# Patient Record
Sex: Male | Born: 1935 | Race: White | Hispanic: No | Smoking: Former smoker
Health system: Southern US, Community
[De-identification: ages and names within clinical notes are randomized; demographics above are authoritative.]

## PROBLEM LIST (undated history)

## (undated) DIAGNOSIS — J449 Chronic obstructive pulmonary disease, unspecified: Secondary | ICD-10-CM

## (undated) DIAGNOSIS — I4891 Unspecified atrial fibrillation: Secondary | ICD-10-CM

## (undated) DIAGNOSIS — E119 Type 2 diabetes mellitus without complications: Secondary | ICD-10-CM

## (undated) DIAGNOSIS — F0390 Unspecified dementia without behavioral disturbance: Secondary | ICD-10-CM

## (undated) DIAGNOSIS — I5042 Chronic combined systolic (congestive) and diastolic (congestive) heart failure: Secondary | ICD-10-CM

## (undated) DIAGNOSIS — I10 Essential (primary) hypertension: Secondary | ICD-10-CM

## (undated) DIAGNOSIS — E7800 Pure hypercholesterolemia, unspecified: Secondary | ICD-10-CM

## (undated) HISTORY — DX: Unspecified dementia, unspecified severity, without behavioral disturbance, psychotic disturbance, mood disturbance, and anxiety: F03.90

## (undated) HISTORY — PX: CHOLECYSTECTOMY: SHX55

## (undated) HISTORY — PX: CARPAL TUNNEL RELEASE: SHX101

## (undated) HISTORY — PX: BACK SURGERY: SHX140

---

## 1999-08-04 ENCOUNTER — Encounter: Payer: Self-pay | Admitting: Emergency Medicine

## 1999-08-04 ENCOUNTER — Emergency Department (HOSPITAL_COMMUNITY): Admission: EM | Admit: 1999-08-04 | Discharge: 1999-08-04 | Payer: Self-pay | Admitting: Emergency Medicine

## 1999-10-22 ENCOUNTER — Emergency Department (HOSPITAL_COMMUNITY): Admission: EM | Admit: 1999-10-22 | Discharge: 1999-10-22 | Payer: Self-pay | Admitting: Emergency Medicine

## 2000-05-01 ENCOUNTER — Inpatient Hospital Stay (HOSPITAL_COMMUNITY): Admission: EM | Admit: 2000-05-01 | Discharge: 2000-05-04 | Payer: Self-pay | Admitting: *Deleted

## 2000-06-24 ENCOUNTER — Emergency Department (HOSPITAL_COMMUNITY): Admission: EM | Admit: 2000-06-24 | Discharge: 2000-06-24 | Payer: Self-pay | Admitting: Emergency Medicine

## 2000-11-17 ENCOUNTER — Encounter: Payer: Self-pay | Admitting: Emergency Medicine

## 2000-11-17 ENCOUNTER — Inpatient Hospital Stay (HOSPITAL_COMMUNITY): Admission: EM | Admit: 2000-11-17 | Discharge: 2000-11-19 | Payer: Self-pay | Admitting: Emergency Medicine

## 2000-11-17 ENCOUNTER — Encounter: Payer: Self-pay | Admitting: Cardiology

## 2000-12-17 ENCOUNTER — Encounter: Payer: Self-pay | Admitting: Emergency Medicine

## 2000-12-17 ENCOUNTER — Emergency Department (HOSPITAL_COMMUNITY): Admission: EM | Admit: 2000-12-17 | Discharge: 2000-12-18 | Payer: Self-pay | Admitting: Emergency Medicine

## 2000-12-18 ENCOUNTER — Encounter: Payer: Self-pay | Admitting: *Deleted

## 2001-05-17 ENCOUNTER — Other Ambulatory Visit: Admission: RE | Admit: 2001-05-17 | Discharge: 2001-05-17 | Payer: Self-pay | Admitting: Gastroenterology

## 2001-05-24 ENCOUNTER — Encounter: Payer: Self-pay | Admitting: Gastroenterology

## 2001-05-24 ENCOUNTER — Ambulatory Visit (HOSPITAL_COMMUNITY): Admission: RE | Admit: 2001-05-24 | Discharge: 2001-05-24 | Payer: Self-pay | Admitting: Gastroenterology

## 2001-12-14 ENCOUNTER — Ambulatory Visit (HOSPITAL_COMMUNITY): Admission: RE | Admit: 2001-12-14 | Discharge: 2001-12-14 | Payer: Self-pay | Admitting: Orthopaedic Surgery

## 2002-02-02 ENCOUNTER — Inpatient Hospital Stay (HOSPITAL_COMMUNITY): Admission: RE | Admit: 2002-02-02 | Discharge: 2002-02-03 | Payer: Self-pay | Admitting: Orthopaedic Surgery

## 2002-02-09 ENCOUNTER — Encounter: Payer: Self-pay | Admitting: Cardiology

## 2002-02-09 ENCOUNTER — Emergency Department (HOSPITAL_COMMUNITY): Admission: EM | Admit: 2002-02-09 | Discharge: 2002-02-09 | Payer: Self-pay | Admitting: *Deleted

## 2002-02-09 ENCOUNTER — Encounter: Payer: Self-pay | Admitting: *Deleted

## 2003-06-10 ENCOUNTER — Inpatient Hospital Stay (HOSPITAL_COMMUNITY): Admission: RE | Admit: 2003-06-10 | Discharge: 2003-06-14 | Payer: Self-pay | Admitting: Orthopaedic Surgery

## 2004-08-08 ENCOUNTER — Emergency Department: Payer: Self-pay | Admitting: Unknown Physician Specialty

## 2004-09-28 ENCOUNTER — Inpatient Hospital Stay (HOSPITAL_COMMUNITY): Admission: EM | Admit: 2004-09-28 | Discharge: 2004-09-30 | Payer: Self-pay | Admitting: Emergency Medicine

## 2005-06-18 ENCOUNTER — Ambulatory Visit: Payer: Self-pay

## 2005-08-06 ENCOUNTER — Ambulatory Visit: Payer: Self-pay | Admitting: Internal Medicine

## 2005-08-19 ENCOUNTER — Ambulatory Visit: Payer: Self-pay | Admitting: Internal Medicine

## 2005-09-16 ENCOUNTER — Ambulatory Visit: Payer: Self-pay | Admitting: Internal Medicine

## 2005-11-10 ENCOUNTER — Ambulatory Visit: Payer: Self-pay | Admitting: Internal Medicine

## 2005-11-16 ENCOUNTER — Ambulatory Visit: Payer: Self-pay | Admitting: Internal Medicine

## 2005-12-28 ENCOUNTER — Ambulatory Visit: Payer: Self-pay | Admitting: Internal Medicine

## 2006-01-31 ENCOUNTER — Ambulatory Visit: Payer: Self-pay | Admitting: Internal Medicine

## 2006-02-16 ENCOUNTER — Ambulatory Visit: Payer: Self-pay | Admitting: Internal Medicine

## 2006-05-09 ENCOUNTER — Ambulatory Visit: Payer: Self-pay | Admitting: Internal Medicine

## 2006-05-19 ENCOUNTER — Ambulatory Visit: Payer: Self-pay | Admitting: Internal Medicine

## 2006-06-07 ENCOUNTER — Ambulatory Visit: Payer: Self-pay | Admitting: Gastroenterology

## 2006-07-13 ENCOUNTER — Ambulatory Visit: Payer: Self-pay | Admitting: Internal Medicine

## 2006-07-15 ENCOUNTER — Ambulatory Visit: Payer: Self-pay | Admitting: Oncology

## 2006-07-20 ENCOUNTER — Ambulatory Visit: Payer: Self-pay | Admitting: Internal Medicine

## 2006-08-17 ENCOUNTER — Other Ambulatory Visit: Payer: Self-pay

## 2006-08-17 ENCOUNTER — Ambulatory Visit: Payer: Self-pay | Admitting: General Surgery

## 2006-08-18 ENCOUNTER — Ambulatory Visit: Payer: Self-pay | Admitting: General Surgery

## 2006-09-06 ENCOUNTER — Ambulatory Visit: Payer: Self-pay | Admitting: Internal Medicine

## 2006-09-17 ENCOUNTER — Ambulatory Visit: Payer: Self-pay | Admitting: Internal Medicine

## 2006-10-06 ENCOUNTER — Ambulatory Visit: Payer: Self-pay | Admitting: Internal Medicine

## 2006-11-15 ENCOUNTER — Other Ambulatory Visit: Payer: Self-pay

## 2006-11-16 ENCOUNTER — Inpatient Hospital Stay: Payer: Self-pay | Admitting: Internal Medicine

## 2007-01-10 ENCOUNTER — Ambulatory Visit: Payer: Self-pay | Admitting: Cardiology

## 2007-01-13 ENCOUNTER — Ambulatory Visit: Payer: Self-pay | Admitting: Gastroenterology

## 2007-01-17 ENCOUNTER — Ambulatory Visit: Payer: Self-pay | Admitting: Internal Medicine

## 2007-01-23 ENCOUNTER — Ambulatory Visit: Payer: Self-pay | Admitting: Internal Medicine

## 2007-02-17 ENCOUNTER — Ambulatory Visit: Payer: Self-pay | Admitting: Internal Medicine

## 2007-03-20 ENCOUNTER — Ambulatory Visit: Payer: Self-pay | Admitting: Internal Medicine

## 2007-05-02 ENCOUNTER — Ambulatory Visit: Payer: Self-pay | Admitting: Family

## 2007-05-20 ENCOUNTER — Ambulatory Visit: Payer: Self-pay | Admitting: Internal Medicine

## 2007-06-12 ENCOUNTER — Ambulatory Visit: Payer: Self-pay | Admitting: Internal Medicine

## 2007-06-19 ENCOUNTER — Ambulatory Visit: Payer: Self-pay | Admitting: Internal Medicine

## 2007-06-27 ENCOUNTER — Ambulatory Visit: Payer: Self-pay

## 2007-08-04 ENCOUNTER — Ambulatory Visit: Payer: Self-pay

## 2007-08-14 ENCOUNTER — Ambulatory Visit: Payer: Self-pay | Admitting: Family

## 2007-08-20 ENCOUNTER — Ambulatory Visit: Payer: Self-pay | Admitting: Internal Medicine

## 2007-09-11 ENCOUNTER — Ambulatory Visit: Payer: Self-pay | Admitting: Internal Medicine

## 2007-09-17 ENCOUNTER — Ambulatory Visit: Payer: Self-pay | Admitting: Internal Medicine

## 2007-10-18 ENCOUNTER — Ambulatory Visit: Payer: Self-pay | Admitting: Internal Medicine

## 2007-10-31 ENCOUNTER — Ambulatory Visit: Payer: Self-pay | Admitting: Family

## 2008-02-14 ENCOUNTER — Ambulatory Visit: Payer: Self-pay | Admitting: Family

## 2008-03-06 ENCOUNTER — Ambulatory Visit: Payer: Self-pay | Admitting: Orthopedic Surgery

## 2008-04-16 ENCOUNTER — Ambulatory Visit: Payer: Self-pay | Admitting: Internal Medicine

## 2008-04-18 ENCOUNTER — Ambulatory Visit: Payer: Self-pay | Admitting: Internal Medicine

## 2008-05-19 ENCOUNTER — Ambulatory Visit: Payer: Self-pay | Admitting: Internal Medicine

## 2008-09-16 ENCOUNTER — Ambulatory Visit: Payer: Self-pay | Admitting: Internal Medicine

## 2008-10-16 ENCOUNTER — Ambulatory Visit: Payer: Self-pay | Admitting: Internal Medicine

## 2008-10-17 ENCOUNTER — Ambulatory Visit: Payer: Self-pay | Admitting: Internal Medicine

## 2009-01-16 ENCOUNTER — Observation Stay: Payer: Self-pay | Admitting: Internal Medicine

## 2009-04-18 ENCOUNTER — Ambulatory Visit: Payer: Self-pay | Admitting: Internal Medicine

## 2009-05-06 ENCOUNTER — Ambulatory Visit: Payer: Self-pay | Admitting: Internal Medicine

## 2009-05-19 ENCOUNTER — Ambulatory Visit: Payer: Self-pay | Admitting: Internal Medicine

## 2009-07-25 ENCOUNTER — Ambulatory Visit: Payer: Self-pay

## 2010-07-14 ENCOUNTER — Inpatient Hospital Stay: Payer: Self-pay | Admitting: Internal Medicine

## 2010-10-08 ENCOUNTER — Emergency Department: Payer: Self-pay | Admitting: Emergency Medicine

## 2011-06-15 ENCOUNTER — Ambulatory Visit: Payer: Self-pay | Admitting: Specialist

## 2013-06-05 ENCOUNTER — Observation Stay: Payer: Self-pay | Admitting: Internal Medicine

## 2013-06-05 LAB — CBC
HCT: 41 % (ref 40.0–52.0)
HGB: 14.3 g/dL (ref 13.0–18.0)
MCH: 29.9 pg (ref 26.0–34.0)
MCHC: 34.9 g/dL (ref 32.0–36.0)
MCV: 86 fL (ref 80–100)
Platelet: 131 10*3/uL — ABNORMAL LOW (ref 150–440)
RBC: 4.78 10*6/uL (ref 4.40–5.90)
RDW: 12.7 % (ref 11.5–14.5)
WBC: 9.4 10*3/uL (ref 3.8–10.6)

## 2013-06-05 LAB — URINALYSIS, COMPLETE
Bacteria: NONE SEEN
Bilirubin,UR: NEGATIVE
Blood: NEGATIVE
Glucose,UR: 50 mg/dL (ref 0–75)
Ketone: NEGATIVE
Leukocyte Esterase: NEGATIVE
Nitrite: NEGATIVE
Ph: 7 (ref 4.5–8.0)
Protein: NEGATIVE
RBC,UR: <1 /HPF (ref 0–5)
Specific Gravity: 1.012 (ref 1.003–1.030)
Squamous Epithelial: NONE SEEN
WBC UR: <1 /HPF (ref 0–5)

## 2013-06-05 LAB — COMPREHENSIVE METABOLIC PANEL
Albumin: 3.5 g/dL (ref 3.4–5.0)
Alkaline Phosphatase: 118 U/L (ref 50–136)
Anion Gap: 8 (ref 7–16)
BUN: 9 mg/dL (ref 7–18)
Bilirubin,Total: 0.6 mg/dL (ref 0.2–1.0)
Calcium, Total: 8.8 mg/dL (ref 8.5–10.1)
Chloride: 106 mmol/L (ref 98–107)
Co2: 27 mmol/L (ref 21–32)
Creatinine: 1.06 mg/dL (ref 0.60–1.30)
EGFR (African American): >60
EGFR (Non-African Amer.): >60
Glucose: 109 mg/dL — ABNORMAL HIGH (ref 65–99)
Osmolality: 281 (ref 275–301)
Potassium: 3.5 mmol/L (ref 3.5–5.1)
SGOT(AST): 18 U/L (ref 15–37)
SGPT (ALT): 22 U/L (ref 12–78)
Sodium: 141 mmol/L (ref 136–145)
Total Protein: 7.2 g/dL (ref 6.4–8.2)

## 2013-06-05 LAB — TROPONIN I: Troponin-I: 0.02 ng/mL

## 2013-06-05 LAB — CK TOTAL AND CKMB (NOT AT ARMC)
CK, Total: 76 U/L (ref 35–232)
CK-MB: 0.9 ng/mL (ref 0.5–3.6)

## 2013-06-05 LAB — RAPID INFLUENZA A&B ANTIGENS

## 2013-06-06 LAB — CBC WITH DIFFERENTIAL/PLATELET
Basophil #: 0 10*3/uL (ref 0.0–0.1)
Basophil %: 0.5 %
Eosinophil #: 0.1 10*3/uL (ref 0.0–0.7)
Eosinophil %: 0.6 %
HCT: 37.4 % — ABNORMAL LOW (ref 40.0–52.0)
HGB: 12.9 g/dL — ABNORMAL LOW (ref 13.0–18.0)
Lymphocyte #: 1.6 10*3/uL (ref 1.0–3.6)
Lymphocyte %: 19.5 %
MCH: 30 pg (ref 26.0–34.0)
MCHC: 34.6 g/dL (ref 32.0–36.0)
MCV: 87 fL (ref 80–100)
Monocyte #: 0.8 x10 3/mm (ref 0.2–1.0)
Monocyte %: 10.1 %
Neutrophil #: 5.7 10*3/uL (ref 1.4–6.5)
Neutrophil %: 69.3 %
Platelet: 129 10*3/uL — ABNORMAL LOW (ref 150–440)
RBC: 4.32 10*6/uL — ABNORMAL LOW (ref 4.40–5.90)
RDW: 12.7 % (ref 11.5–14.5)
WBC: 8.3 10*3/uL (ref 3.8–10.6)

## 2013-06-06 LAB — BASIC METABOLIC PANEL
Anion Gap: 5 — ABNORMAL LOW (ref 7–16)
BUN: 11 mg/dL (ref 7–18)
Calcium, Total: 8.5 mg/dL (ref 8.5–10.1)
Chloride: 107 mmol/L (ref 98–107)
Co2: 28 mmol/L (ref 21–32)
Creatinine: 1.11 mg/dL (ref 0.60–1.30)
EGFR (African American): >60
EGFR (Non-African Amer.): >60
Glucose: 110 mg/dL — ABNORMAL HIGH (ref 65–99)
Osmolality: 279 (ref 275–301)
Potassium: 3.5 mmol/L (ref 3.5–5.1)
Sodium: 140 mmol/L (ref 136–145)

## 2013-06-06 LAB — LIPID PANEL
Cholesterol: 89 mg/dL (ref 0–200)
HDL Cholesterol: 37 mg/dL — ABNORMAL LOW (ref 40–60)
Ldl Cholesterol, Calc: 33 mg/dL (ref 0–100)
Triglycerides: 95 mg/dL (ref 0–200)
VLDL Cholesterol, Calc: 19 mg/dL (ref 5–40)

## 2013-06-06 LAB — TROPONIN I
Troponin-I: 0.03 ng/mL
Troponin-I: 0.03 ng/mL

## 2013-06-07 LAB — CREATININE, SERUM
Creatinine: 1.06 mg/dL (ref 0.60–1.30)
EGFR (African American): >60
EGFR (Non-African Amer.): >60

## 2013-06-07 LAB — CLOSTRIDIUM DIFFICILE(ARMC)

## 2013-06-07 LAB — HEMOGLOBIN: HGB: 12.5 g/dL — ABNORMAL LOW (ref 13.0–18.0)

## 2013-06-10 LAB — STOOL CULTURE

## 2013-06-10 LAB — CULTURE, BLOOD (SINGLE)

## 2013-08-20 ENCOUNTER — Ambulatory Visit: Payer: Self-pay | Admitting: Pain Medicine

## 2013-08-20 LAB — SEDIMENTATION RATE: Erythrocyte Sed Rate: 23 mm/hr — ABNORMAL HIGH (ref 0–20)

## 2013-09-07 ENCOUNTER — Ambulatory Visit: Payer: Self-pay | Admitting: Pain Medicine

## 2014-11-08 NOTE — Discharge Summary (Signed)
PATIENT NAME:  Bobby Krueger, LIPSKY MR#:  [id number] DATE OF BIRTH:  Jan 21, 1927  DATE OF ADMISSION:  06/05/2013 DATE OF DISCHARGE:  06/07/2013  ADMITTING DIAGNOSIS: Abdominal pain of unclear etiology.   DISCHARGE DIAGNOSES:  1.  Generalized weakness, likely multifactorial.  2.  Congestive heart failure, acute on chronic, combined systolic, diastolic.  3.  Lower extremity swelling with no deep vein thrombosis.  4.  Cardiomyopathy, ejection fraction of 40% to 45%.  5.  Acute bronchitis.  6.  Chronic obstructive pulmonary disease exacerbation due to bronchitis.  7.  Right lower quadrant abdominal pain.  8.  Diarrhea, chronic, according to history.  9.  Questionable exacerbation of abdominal pain, etiology unclear at this point. Clostridium difficile negative.  10.  Glaucoma.  11.  Chronic obstructive pulmonary disease.  12.  Obstructive sleep apnea.  13.  Coronary artery disease.  14.  Hypertension.  15.  Gastroesophageal reflux disease.  16.  Hyperlipidemia.  17.  Diabetes mellitus.  18.  Left bundle branch block.   DISCHARGE CONDITION: Stable.   DISCHARGE MEDICATIONS:  1.  Zolpidem 10 mg p.o. at bedtime as needed.  2.  Omega-3 polyunsaturated fatty acids 1 g once daily.  3.  Omeprazole 10 mg p.o. daily.  4.  Promethazine 25 mg every 6 hours as needed.  5.  Simvastatin 40 mg p.o. daily.  6.  Terazosin 5 mg p.o. daily.  7.  Spiriva 18 mcg inhalation daily.  8.  Loperamide 2 mg every day as needed.  9.  Brimonidine ophthalmic solution 0.2% one drop twice daily.  10.  Carvedilol 6.25 mg twice daily.  11.  Clonazepam 1 mg p.o. twice daily as needed.  12.  Clopidogrel 75 mg p.o. daily.  13.  Dorzolamide ophthalmic solution 2% one drop twice daily.  14.  Advair Diskus 250/50 one puff twice daily.  15.  Gabapentin 300 mg p.o. 3 times daily.  16.  Latanoprost ophthalmic solution 0.005% one drop to each eye at bedtime.  17.  Starlix 120 mg p.o. 3 times daily 30 minutes before meals.   18.  Meclizine 25 mg three times daily as needed.  19.  Losartan 100 mg p.o. daily.  20.  Doxycycline 100 mg tablets 1 tablet twice daily for 7 days.   HOME OXYGEN: None.   DIET: Two grams salt, low fat, low cholesterol, carbohydrate-controlled diet, regular consistency.   ACTIVITY LIMITATIONS: As tolerated.   REFERRAL: To home health physical therapy R.N. as well as aide.   FOLLOWUP APPOINTMENT: Dr. Vear Clock in 2 days after discharge. Dr. Lady Gary in 1 week after discharge.   CONSULTANTS: Care management, social work.   RADIOLOGIC STUDIES: Chest, portable single view, 18th of November 2014, revealed congestive heart failure with mild interstitial edema.   CT scan of chest, abdomen, as well as pelvis, with contrast, 18th of November 2014, revealed no definite acute findings in the chest, abdomen or pelvis, specifically nothing to account for the patient's history of fever. 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 Strictly speaking, per radiologist, imaging cannot reliably differentiate a lipoma from low-grade liposarcoma. Should the patient develop pain or increasing swelling in the region in the future, repeat imaging may be warranted. No definite suspicious features identified at this time. Additional incidental findings were noted and described before.   CT scan with IV contrast of the chest to rule out pulmonary embolism showed no filling defects identified in pulmonary  arterial tree to suggest pulmonary embolus, cardiomegaly, abnormal airway thickening bilaterally, especially in the lower lobes, were noted. Appearance is suspicious for bronchitis or with bronchiolitis or granulomatous disease, thoracic spondylosis, degenerative left glenohumeral arthropathy.   Bilateral lower extremity Doppler ultrasound was negative for bilateral lower extremity DVT.    HOSPITAL COURSE: The patient is an 81 year old Caucasian male who presents to the hospital on the 18th of November 2014, with complaints of weakness. Please refer to Dr. Mathews Robinsons admission on the 18th of November 2014.   On arrival to the hospital, the patient's temperature was 101.5. Pulse was 56. Respiration rate was 20. Blood pressure 147/77. Pulse oximetry was 90% on room air.   PHYSICAL EXAM: Unremarkable.   The patient's lab data done in the Emergency Room showed normal BMP except mild elevation of glucose to 109. Liver enzymes were normal. Cardiac enzymes x3 were within normal limits. CBC within normal limits with white blood cell count 9.4, hemoglobin 14.3, platelet count 131.   Influenza test was negative. Blood cultures taken on the 18th of November 2014, showed no growth. Urinalysis was unremarkable.   The patient's EKG showed normal sinus rhythm with left axis deviation, left bundle branch block but no significant change since prior EKG.   The patient was admitted to the hospital for further evaluation. Because of fevers as well as the patient's complaints of increasing diarrhea as well as some abdominal pain, he was started on Rocephin as well as Flagyl; however, since the patient did not develop any significant abnormalities or worsening diarrhea, it was felt that the patient very unlikely has any gastrointestinal abnormality or disease. He, however, was coughing and was complaining of more shortness of breath, so we felt the patient's presentation very likely was due to COPD exacerbation, acute bronchitis.   We continued him on Rocephin and added Zithromax; however, upon discharge, we decided to change his antibiotic to doxycycline. The patient is to continue doxycycline for 7 days to complete course.   He is to follow up with his primary physician, Dr. Vear Clock, in the next few days after discharge.   The patient was complaining of weakness. We felt the patient's weakness was  very likely due to fevers. The patient received physical therapy while he was in the hospital and was recommended home health physical therapy. He will be prescribed home health physical therapy R.N. as well as aide at home.   He was also evaluated for heart disease since his initial chest x-ray was concerning for acute-on-chronic CHF. We repeated his echocardiogram. Echocardiogram was read by Dr. Lady Gary and performed on the 19th of November 2014. It revealed a left ventricular ejection fraction by visual estimation of 40% to 45%., Mildly decreased global left ventricular systolic function was noted as well as mildly dilated left atrium and mild tricuspid regurgitation. Estimated right ventricular systolic pressure was normal at 26.9. It was felt that the patient has mild cardiomyopathy which was different from echocardiogram done 2 years ago, so we felt that the patient would benefit from evaluation by Dr. Lady Gary who saw the patient possibly getting a stress test.   The patient is to continue Coreg as well as losartan at home.   DISCHARGE VITAL SIGNS: Stable with temperature of 97.4. Pulse was 74. Respirations were 20, blood pressure 112/67. Saturation was 94% on room air at rest.   In regards to his chronic issues, the patient is to continue outpatient medications. No changes were made.   The patient is being discharged  in stable condition with the above-mentioned medications and follow-up.   TIME SPENT: 40 minutes.    ____________________________ Katharina Caper, MD rv:np D: 06/07/2013 16:45:23 ET T: 06/07/2013 20:47:57 ET JOB#: [id number]  cc: Katharina Caper, MD, <Dictator> Marcine Matar., MD Darlin Priestly Lady Gary, MD  Katharina Caper MD ELECTRONICALLY SIGNED 06/26/2013 15:40

## 2014-11-08 NOTE — H&P (Signed)
PATIENT NAME:  Bobby Krueger, ROOKE MR#:  [id number] DATE OF BIRTH:  [birth year]  DATE OF ADMISSION:  06/05/2013  PRIMARY CARE PHYSICIAN:  At the [facility] and Dr. Vear Clock.   CHIEF COMPLAINT:  Weakness.   HISTORY OF PRESENT ILLNESS: This is an 81 year old man who woke up this morning very weak, could not even sit up. His daughter had help him up, last night started having a cough. The patient chronically has testicular pain and is followed up by Dr. Lonna Cobb. He does have diarrhea on and off for years, depending on what he eats. He is also having some lower abdominal pain. In the ER, he did have a fever. A CT scan of the chest, abdomen and pelvis was unremarkable. Hospitalist services were contacted for further evaluation secondary to the weakness.   PAST MEDICAL HISTORY;  Glaucoma, COPD, sleep apnea on CPAP and oxygen at night, anxiety, coronary artery disease, history of congestive heart failure, hypertension, gastroesophageal reflux disease, hyperlipidemia, diabetes and left bundle branch block.   PAST SURGICAL HISTORY: Five back surgeries and neck surgery, gallbladder and hemorrhoids x 2.   ALLERGIES: ALDACTONE, ALTACE, ASPIRIN AND CIPRO.   MEDICATIONS: Include Advair Diskus 250/50 one inhalation twice a day, brimonidine  ophthalmic 0.2% ophthalmic solution 1 drop each eye twice a day, Coreg 6.25 mg twice a day, clonazepam 1 mg 1 tablet twice a day as needed for anxiety and nervousness, Plavix 75 mg daily, dorzolamide 2% ophthalmic solution 1 drop each eye twice a day, gabapentin 300 mg 3 times a day, latanoprost 0.005% ophthalmic solution 1 drop each eye at bedtime, loperamide 2 mg daily, losartan 100 mg daily, meclizine 25 mg 3 times a day, omega-3 polyunsaturated fatty acid 1000 mg daily, omeprazole 20 mg daily, promethazine 25 mg q.6 hours as needed for nausea or vomiting, simvastatin 40 mg at bedtime, Spiriva 1 inhalation daily, Starlix 20 mg 3 times a day, terazosin 5 mg daily, Ambien 10 mg at bedtime  p.r.n. insomnia.   SOCIAL HISTORY: No smoking. No alcohol. No drug use. 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 Lives with family.   FAMILY HISTORY: Mother died of old age, father died of prostate cancer.   REVIEW OF SYSTEMS: CONSTITUTIONAL: Positive for fever. Positive for chills. Positive for weakness. Positive for weight loss over the past year, 23 pounds.  EYES: Does wear glasses and has cataracts, not ready for surgery yet.  EARS, NOSE, MOUTH AND THROAT: Positive for hearing loss, wearing hearing aids. Positive for runny nose. Positive for sore throat.  RESPIRATORY: Positive for shortness of breath. Positive for cough. No sputum. No hemoptysis.  GASTROINTESTINAL: Positive for abdominal pain. Positive for diarrhea. No nausea or vomiting. No bright red blood per rectum. No melena.  GENITOURINARY: No burning on urination or hematuria. Positive for chronic testicular pain.  MUSCULOSKELETAL: No joint pain.  INTEGUMENT: No rashes or eruptions.  NEUROLOGIC: No fainting or blackouts. Positive for dizziness.  PSYCHIATRIC: Positive for anxiety.  ENDOCRINE: No thyroid problems.  HEMATOLOGIC AND LYMPHATIC: No anemia, no easy bruising or bleeding.   PHYSICAL EXAMINATION: VITAL SIGNS: On presentation included a temperature of 101.5, pulse 56, respirations 20, blood pressure 147/77, pulse ox 90% on room air.  GENERAL: No respiratory distress.  EYES: Conjunctivae and lids normal. Pupils equal, round and reactive to light. Extraocular muscles intact. No nystagmus.  EARS, NOSE, MOUTH AND THROAT: Tympanic membranes: No erythema. Nasal mucosa: No erythema. Throat: Erythema but no exudate seen. Lips and gums: No lesions.  NECK: No JVD. No bruits. No lymphadenopathy. No thyromegaly. No thyroid nodules palpated.  RESPIRATORY:  Lungs clear to auscultation. No use of accessory muscles to breathe. No rhonchi, rales or wheeze heard.  CARDIOVASCULAR: S1, S2 normal. No gallops, rubs or murmurs heard.  Carotid upstroke 2+ bilaterally. No bruits.  EXTREMITIES: Dorsalis pedis pulses 2+ bilaterally, 2+ edema of lower extremity. No clubbing, no cyanosis. ABDOMEN: Soft. Positive tenderness throughout the entire lower abdomen. No organomegaly/splenomegaly. Normoactive bowel sounds. No masses felt.  RECTAL EXAM DONE BY ME: Prostate nontender, guaiac-negative. Testicular pain in the right testicle to palpation more than the left testicle. Epididymis no pain.  SKIN: No ulcers or lesions seen.  NEUROLOGIC: Cranial nerves II through XII grossly intact. Deep tendon reflexes 2+ bilateral lower extremity. Power 4 out of 5 bilateral upper and lower extremities.  PSYCHIATRIC: The patient is oriented to person, place and time.   LABORATORY AND RADIOLOGICAL DATA: CT scan of the chest, abdomen and pelvis with contrast was unremarkable. Influenza negative. Urinalysis negative. Glucose 109, BUN 9, creatinine 1.06, sodium 141, potassium 3.5, chloride 106, CO2 27, calcium 8.8. Liver function tests normal range. White blood cell count 9.4, H and H 14.3 and 41.0, platelet count 131. Troponin negative. EKG shows a left bundle branch block.   ASSESSMENT AND PLAN: 1.  Diffuse lower abdominal pain with fever and weakness. CT scan is unremarkable. Could this be a colitis or diverticulitis not seen on CAT scan, I am not sure. Patient was already given Rocephin and Flagyl. I will continue those and give IV fluid hydration, get physical therapy to see him for the weakness, send off stool studies but diarrhea seems to be more chronic rather than acute. Will observe overnight and see how he does. This could also be a viral infection. Influenza negative in the ER. We will continue to monitor vital signs closely.  2.  Chronic obstructive pulmonary and sleep apnea. Continue oxygen and CPAP at night. Respiratory status stable.  3.  History of coronary artery disease, on Plavix. No chest pain at this point. Will monitor on off-unit  telemetry and check a few more sets of cardiac enzymes. His left bundle branch block is old.  4.  Hypertension. Blood pressure is stable.  5.  Diabetes. We will put on sliding scale and hold the Starlix right now.  6.  Hyperlipidemia. We will check a lipid profile in the a.m.  7.  Gastroesophageal reflux disease. Continue omeprazole.   TIME SPENT ON ADMISSION: 55 minutes.   CODE STATUS: The patient wishes to be a DO NOT RESUSCITATE.   Of note, there was a Foley catheter placed in the Emergency Room. This will have to be removed in the morning.    ____________________________ Herschell Dimes. Renae Gloss, MD rjw:[id number] D: 06/05/2013 20:09:57 ET T: 06/05/2013 20:29:00 ET JOB#: [id number]  cc: Herschell Dimes. Renae Gloss, MD, <Dictator> Salley Scarlet MD ELECTRONICALLY SIGNED 06/12/2013 14:14

## 2015-05-06 ENCOUNTER — Encounter: Payer: Self-pay | Admitting: Emergency Medicine

## 2015-05-06 ENCOUNTER — Emergency Department: Payer: Medicare Other

## 2015-05-06 ENCOUNTER — Emergency Department
Admission: EM | Admit: 2015-05-06 | Discharge: 2015-05-06 | Disposition: A | Discharge status: Home / Self Care | Payer: Medicare Other | Attending: Emergency Medicine | Admitting: Emergency Medicine

## 2015-05-06 DIAGNOSIS — Z87891 Personal history of nicotine dependence: Secondary | ICD-10-CM | POA: Diagnosis not present

## 2015-05-06 DIAGNOSIS — R42 Dizziness and giddiness: Secondary | ICD-10-CM | POA: Diagnosis not present

## 2015-05-06 DIAGNOSIS — R55 Syncope and collapse: Secondary | ICD-10-CM | POA: Diagnosis present

## 2015-05-06 HISTORY — DX: Essential (primary) hypertension: I10

## 2015-05-06 HISTORY — DX: Chronic obstructive pulmonary disease, unspecified: J44.9

## 2015-05-06 HISTORY — DX: Pure hypercholesterolemia, unspecified: E78.00

## 2015-05-06 LAB — URINALYSIS COMPLETE WITH MICROSCOPIC (ARMC ONLY)
BACTERIA UA: NONE SEEN
BILIRUBIN URINE: NEGATIVE
GLUCOSE, UA: NEGATIVE mg/dL
Ketones, ur: NEGATIVE mg/dL
Leukocytes, UA: NEGATIVE
NITRITE: NEGATIVE
Protein, ur: NEGATIVE mg/dL
SQUAMOUS EPITHELIAL / LPF: NONE SEEN
Specific Gravity, Urine: 1.005 (ref 1.005–1.030)
WBC, UA: NONE SEEN WBC/hpf (ref 0–5)
pH: 7 (ref 5.0–8.0)

## 2015-05-06 LAB — BASIC METABOLIC PANEL
ANION GAP: 4 — AB (ref 5–15)
BUN: 11 mg/dL (ref 6–20)
CALCIUM: 9.5 mg/dL (ref 8.9–10.3)
CO2: 31 mmol/L (ref 22–32)
CREATININE: 1.04 mg/dL (ref 0.61–1.24)
Chloride: 106 mmol/L (ref 101–111)
GFR calc Af Amer: >60 mL/min (ref >60)
GLUCOSE: 114 mg/dL — AB (ref 65–99)
Potassium: 3.7 mmol/L (ref 3.5–5.1)
Sodium: 141 mmol/L (ref 135–145)

## 2015-05-06 LAB — CBC
HCT: 44.4 % (ref 40.0–52.0)
Hemoglobin: 15 g/dL (ref 13.0–18.0)
MCH: 29.6 pg (ref 26.0–34.0)
MCHC: 33.7 g/dL (ref 32.0–36.0)
MCV: 88.1 fL (ref 80.0–100.0)
PLATELETS: 153 10*3/uL (ref 150–440)
RBC: 5.05 MIL/uL (ref 4.40–5.90)
RDW: 13.2 % (ref 11.5–14.5)
WBC: 5.1 10*3/uL (ref 3.8–10.6)

## 2015-05-06 LAB — TROPONIN I: Troponin I: <0.03 ng/mL (ref <0.031)

## 2015-05-06 MED ORDER — MECLIZINE HCL 25 MG PO TABS: 25.0000 mg | ORAL_TABLET | Freq: Three times a day (TID) | ORAL | Status: DC | PRN

## 2015-05-06 MED ORDER — MECLIZINE HCL 25 MG PO TABS
25.0000 mg | ORAL_TABLET | Freq: Once | ORAL | Status: AC
  Administered 2015-05-06: 25 mg via ORAL
  Filled 2015-05-06: qty 1

## 2015-05-06 MED ORDER — SODIUM CHLORIDE 0.9 % IV BOLUS (SEPSIS)
1000.0000 mL | Freq: Once | INTRAVENOUS | Status: AC
  Administered 2015-05-06: 1000 mL via INTRAVENOUS

## 2015-05-06 NOTE — Discharge Instructions (Signed)
Please make an appointment for follow-up with your regular physician. Please make sure that you drink plenty of fluid to stay well hydrated.  Please return to the emergency department if he developed severe headache, dizziness, numbness or tingling, weakness, trouble with your vision or speech or you're walking, fever or any other symptoms concerning to you.  Dizziness Dizziness is a common problem. It is a feeling of unsteadiness or light-headedness. You may feel like you are about to faint. Dizziness can lead to injury if you stumble or fall. Anyone can become dizzy, but dizziness is more common in older adults. This condition can be caused by a number of things, including medicines, dehydration, or illness. HOME CARE INSTRUCTIONS Taking these steps may help with your condition: Eating and Drinking  Drink enough fluid to keep your urine clear or pale yellow. This helps to keep you from becoming dehydrated. Try to drink more clear fluids, such as water.  Do not drink alcohol.  Limit your caffeine intake if directed by your health care provider.  Limit your salt intake if directed by your health care provider. Activity  Avoid making quick movements.  Rise slowly from chairs and steady yourself until you feel okay.  In the morning, first sit up on the side of the bed. When you feel okay, stand slowly while you hold onto something until you know that your balance is fine.  Move your legs often if you need to stand in one place for a long time. Tighten and relax your muscles in your legs while you are standing.  Do not drive or operate heavy machinery if you feel dizzy.  Avoid bending down if you feel dizzy. Place items in your home so that they are easy for you to reach without leaning over. Lifestyle  Do not use any tobacco products, including cigarettes, chewing tobacco, or electronic cigarettes. If you need help quitting, ask your health care provider.  Try to reduce your stress  level, such as with yoga or meditation. Talk with your health care provider if you need help. General Instructions  Watch your dizziness for any changes.  Take medicines only as directed by your health care provider. Talk with your health care provider if you think that your dizziness is caused by a medicine that you are taking.  Tell a friend or a family member that you are feeling dizzy. If he or she notices any changes in your behavior, have this person call your health care provider.  Keep all follow-up visits as directed by your health care provider. This is important. SEEK MEDICAL CARE IF:  Your dizziness does not go away.  Your dizziness or light-headedness gets worse.  You feel nauseous.  You have reduced hearing.  You have new symptoms.  You are unsteady on your feet or you feel like the room is spinning. SEEK IMMEDIATE MEDICAL CARE IF:  You vomit or have diarrhea and are unable to eat or drink anything.  You have problems talking, walking, swallowing, or using your arms, hands, or legs.  You feel generally weak.  You are not thinking clearly or you have trouble forming sentences. It may take a friend or family member to notice this.  You have chest pain, abdominal pain, shortness of breath, or sweating.  Your vision changes.  You notice any bleeding.  You have a headache.  You have neck pain or a stiff neck.  You have a fever.   This information is not intended to replace advice  given to you by your health care provider. Make sure you discuss any questions you have with your health care provider.   Document Released: 12/29/2000 Document Revised: 11/19/2014 Document Reviewed: 07/01/2014 [employer] Interactive Patient Education [employer]

## 2015-05-06 NOTE — ED Provider Notes (Signed)
[facility] Emergency Department Provider Note  ____________________________________________  Time seen: Approximately 12:18 PM  I have reviewed the triage vital signs and the nursing notes.   HISTORY  Chief Complaint Near Syncope    HPI KHI MCMILLEN is a 81 y.o. male with a history of COPD, CHF, hypertension and hyperlipidemia presenting with dizziness. Patient states that yesterday evening prior to going to bed he began to feel "mildly" dizzy. This morning when he woke up his symptoms were significantly worse. He describes "room spinning." He denies lightheadedness, numbness or tingling, headache, visual changes, changes in speech, changes in gait. He did not have any associated chest pain, shortness of breath or palpitations. He denies any recent cough or cold symptoms fevers chills, melena, nausea or vomiting or diarrhea, changes in his medications, or trauma.   Past Medical History  Diagnosis Date  . COPD (chronic obstructive pulmonary disease) (HCC)   . Hypertension   . High cholesterol   . CHF (congestive heart failure) (HCC)     There are no active problems to display for this patient.   Past Surgical History  Procedure Laterality Date  . Carpal tunnel release    . Cholecystectomy    . Back surgery      Current Outpatient Rx  Name  Route  Sig  Dispense  Refill  . meclizine (ANTIVERT) 25 MG tablet   Oral   Take 1 tablet (25 mg total) by mouth 3 (three) times daily as needed for dizziness.   15 tablet   0     Allergies Aldactone; Aspirin; Ciprofloxacin; Cyclobenzaprine; and Lexapro  History reviewed. No pertinent family history.  Social History Social History  Substance Use Topics  . Smoking status: Former Games developer  . Smokeless tobacco: None  . Alcohol Use: No    Review of Systems Constitutional: No fever/chills. No syncope.  Eyes: No visual changes. ENT: No sore throat. Cardiovascular: Denies chest pain,  palpitations. Respiratory: Denies shortness of breath.  No cough. Gastrointestinal: No abdominal pain.  No nausea, no vomiting.  No diarrhea.  No constipation. Genitourinary: Negative for dysuria. Musculoskeletal: Negative for back pain. Skin: Negative for rash. Neurological: Negative for headaches, focal weakness or numbness. Positive for dizziness.  10-point ROS otherwise negative.  ____________________________________________   PHYSICAL EXAM:  VITAL SIGNS: ED Triage Vitals  Enc Vitals Group     BP 05/06/15 1109 168/87 mmHg     Pulse Rate 05/06/15 1109 74     Resp 05/06/15 1109 16     Temp 05/06/15 1109 98.3 F (36.8 C)     Temp src --      SpO2 --      Weight 05/06/15 1109 220 lb (99.791 kg)     Height 05/06/15 1109  (1.676 m)     Head Cir --      Peak Flow --      Pain Score --      Pain Loc --      Pain Edu? --      Excl. in GC? --     Constitutional: Alert and oriented. Well appearing and in no acute distress. Answer question appropriately. Eyes: Conjunctivae are normal.  EOMI. PERRLA. No scleral icterus. Head: Atraumatic. Nose: No congestion/rhinnorhea. Mouth/Throat: Mucous membranes are moist.  Neck: No stridor.  Supple.  No JVD. No meningismus. Cardiovascular: Normal rate, regular rhythm. No murmurs, rubs or gallops.  Respiratory: Normal respiratory effort.  No retractions. Lungs CTAB.  No wheezes, rales or ronchi.  Gastrointestinal: Soft and nontender. No distention. No peritoneal signs. Musculoskeletal: No LE edema. No palpable cords or calf tenderness. Neurologic: Alert and oriented 3. Speech is clear. Naming and repetition are intact. Face and smile symmetric. Tongue is midline. EOMI without nystagmus but movement of the eyes does reproduce his dizzy feeling. Normal cheek puff. No pronator drift. 5 out of 5 grip, biceps, triceps, hip flexors, plantar flexion and dorsiflexion. Normal sensation to light touch in the bilateral upper and lower extremities,  and face. Normal finger-nose-finger. Skin:  Skin is warm, dry and intact. No rash noted. Psychiatric: Mood and affect are normal. Speech and behavior are normal.  Normal judgement. ____________________________________________   LABS (all labs ordered are listed, but only abnormal results are displayed)  Labs Reviewed  BASIC METABOLIC PANEL - Abnormal; Notable for the following:    Glucose, Bld 114 (*)    Anion gap 4 (*)    All other components within normal limits  URINALYSIS COMPLETEWITH MICROSCOPIC ([facility] ONLY) - Abnormal; Notable for the following:    Color, Urine STRAW (*)    APPearance CLEAR (*)    Hgb urine dipstick 1+ (*)    All other components within normal limits  CBC  TROPONIN I   ____________________________________________  EKG  ED ECG REPORT I, Rockne Menghini, the attending physician, personally viewed and interpreted this ECG.   Date: 05/06/2015  EKG Time: 1425  Rate: 88  Rhythm: LBBB with rate 88  Axis: Leftward  Intervals:none  ST&T Change: NO ST elevation, does not meet [place] criteria.  Unchanged compared to prior. ____________________________________________  RADIOLOGY  Dg Chest 2 View  05/06/2015  CLINICAL DATA:  Dizziness upon waking this morning which has not improved, COPD, hypertension, former smoker, history CHF EXAM: CHEST  2 VIEW COMPARISON:  06/05/2013 FINDINGS: Upper normal heart size. Calcification and mild tortuosity of thoracic aorta. Mediastinal contours and pulmonary vascularity normal. Calcified granuloma posterior LEFT lower lobe. Lungs clear. No pleural effusion or pneumothorax. Osseous demineralization with scattered degenerative disc disease changes thoracic spine. Prior inferior cervical spine fusion. Advanced degenerative changes LEFT glenohumeral joint. IMPRESSION: No acute abnormalities. Electronically Signed   By: Ulyses Southward M.D.   On: 05/06/2015 11:59   Ct Head Wo Contrast  05/06/2015  ADDENDUM REPORT: 05/06/2015  13:02 ADDENDUM: Comparison made to 07/14/2010 Skull and Sinuses:Negative for fracture or destructive process. No acute sinusitis or mastoiditis. Orbits: No acute abnormality. Brain: No evidence of acute or remote infarction, hemorrhage, hydrocephalus, or mass lesion/mass effect. Patchy low-density in the bilateral cerebral white matter consistent with chronic small vessel disease, mild to moderate for age. Normal cerebral volume for age. IMPRESSION: No acute finding or significant change since 2011. Electronically Signed   By: Marnee Spring M.D.   On: 05/06/2015 13:02  05/06/2015  EXAM: CT HEAD WITHOUT CONTRAST TECHNIQUE: Contiguous axial images were obtained from the base of the skull through the vertex without intravenous contrast. COMPARISON:  None. Electronically Signed: By: Natasha Mead M.D. On: 05/06/2015 12:02    ____________________________________________   PROCEDURES  Procedure(s) performed: None  Critical Care performed: No ____________________________________________   INITIAL IMPRESSION / ASSESSMENT AND PLAN / ED COURSE  Pertinent labs & imaging results that were available during my care of the patient were reviewed by me and considered in my medical decision making (see chart for details).  81 y.o. male with a history of COPD and hypertension, CHF and cholesterol presenting with dizziness since last night. On exam he has no focal findings neurologically except  that his symptoms are reproduced with any eye movement. I will consider vertigo, CVA although he does not have evidence of ataxia on exam. Consider anemia, electrolyte disturbance, UTI, and dehydration.    ____________________________________________  FINAL CLINICAL IMPRESSION(S) / ED DIAGNOSES  Final diagnoses:  Dizziness      NEW MEDICATIONS STARTED DURING THIS VISIT:  New Prescriptions   MECLIZINE (ANTIVERT) 25 MG TABLET    Take 1 tablet (25 mg total) by mouth 3 (three) times daily as needed for dizziness.      Rockne Menghini, MD 05/06/15 1435

## 2015-05-06 NOTE — Care Management Note (Signed)
Case Management Note  Patient Details  Name: Bobby Krueger MRN: [id number] Date of Birth: 1926/09/02  Subjective/Objective:    Informed by Pt. Access that the pt. Has VA benefits. He has just arrived with c/o  Dizziness.               Action/Plan:   Expected Discharge Date:                  Expected Discharge Plan:     In-House Referral:     Discharge planning Services     Post Acute Care Choice:    Choice offered to:     DME Arranged:    DME Agency:     HH Arranged:    HH Agency:     Status of Service:     Medicare Important Message Given:    Date Medicare IM Given:    Medicare IM give by:    Date Additional Medicare IM Given:    Additional Medicare Important Message give by:     If discussed at Long Length of Stay Meetings, dates discussed:    Additional Comments:  Berna Bue, RN 05/06/2015, 11:05 AM

## 2015-05-06 NOTE — ED Notes (Signed)
Pt from home via ems with c/o dizziness when he woke up this am states it hasnt gotten any better. Per pt this has never happened before.

## 2015-07-08 ENCOUNTER — Emergency Department (HOSPITAL_COMMUNITY)
Admission: EM | Admit: 2015-07-08 | Discharge: 2015-07-08 | Disposition: A | Discharge status: Home / Self Care | Payer: Medicare Other | Attending: Emergency Medicine | Admitting: Emergency Medicine

## 2015-07-08 ENCOUNTER — Encounter (HOSPITAL_COMMUNITY): Payer: Self-pay

## 2015-07-08 DIAGNOSIS — R197 Diarrhea, unspecified: Secondary | ICD-10-CM | POA: Insufficient documentation

## 2015-07-08 DIAGNOSIS — I10 Essential (primary) hypertension: Secondary | ICD-10-CM | POA: Insufficient documentation

## 2015-07-08 DIAGNOSIS — R110 Nausea: Secondary | ICD-10-CM | POA: Insufficient documentation

## 2015-07-08 DIAGNOSIS — I509 Heart failure, unspecified: Secondary | ICD-10-CM | POA: Diagnosis not present

## 2015-07-08 DIAGNOSIS — R112 Nausea with vomiting, unspecified: Secondary | ICD-10-CM | POA: Diagnosis present

## 2015-07-08 DIAGNOSIS — J449 Chronic obstructive pulmonary disease, unspecified: Secondary | ICD-10-CM | POA: Diagnosis not present

## 2015-07-08 DIAGNOSIS — Z87891 Personal history of nicotine dependence: Secondary | ICD-10-CM | POA: Diagnosis not present

## 2015-07-08 DIAGNOSIS — E119 Type 2 diabetes mellitus without complications: Secondary | ICD-10-CM | POA: Insufficient documentation

## 2015-07-08 HISTORY — DX: Type 2 diabetes mellitus without complications: E11.9

## 2015-07-08 LAB — CBC WITH DIFFERENTIAL/PLATELET
Basophils Absolute: 0 10*3/uL (ref 0.0–0.1)
Basophils Relative: 0 %
EOS PCT: 4 %
Eosinophils Absolute: 0.2 10*3/uL (ref 0.0–0.7)
HCT: 45.5 % (ref 39.0–52.0)
Hemoglobin: 15.7 g/dL (ref 13.0–17.0)
LYMPHS ABS: 1.6 10*3/uL (ref 0.7–4.0)
LYMPHS PCT: 24 %
MCH: 30.3 pg (ref 26.0–34.0)
MCHC: 34.5 g/dL (ref 30.0–36.0)
MCV: 87.7 fL (ref 78.0–100.0)
MONO ABS: 0.4 10*3/uL (ref 0.1–1.0)
Monocytes Relative: 5 %
Neutro Abs: 4.4 10*3/uL (ref 1.7–7.7)
Neutrophils Relative %: 67 %
PLATELETS: 163 10*3/uL (ref 150–400)
RBC: 5.19 MIL/uL (ref 4.22–5.81)
RDW: 12.5 % (ref 11.5–15.5)
WBC: 6.6 10*3/uL (ref 4.0–10.5)

## 2015-07-08 LAB — COMPREHENSIVE METABOLIC PANEL
ALT: 17 U/L (ref 17–63)
AST: 21 U/L (ref 15–41)
Albumin: 4.4 g/dL (ref 3.5–5.0)
Alkaline Phosphatase: 102 U/L (ref 38–126)
Anion gap: 10 (ref 5–15)
BUN: 11 mg/dL (ref 6–20)
CHLORIDE: 104 mmol/L (ref 101–111)
CO2: 26 mmol/L (ref 22–32)
Calcium: 10 mg/dL (ref 8.9–10.3)
Creatinine, Ser: 1.03 mg/dL (ref 0.61–1.24)
Glucose, Bld: 164 mg/dL — ABNORMAL HIGH (ref 65–99)
POTASSIUM: 3.8 mmol/L (ref 3.5–5.1)
Sodium: 140 mmol/L (ref 135–145)
Total Bilirubin: 1.3 mg/dL — ABNORMAL HIGH (ref 0.3–1.2)
Total Protein: 8.3 g/dL — ABNORMAL HIGH (ref 6.5–8.1)

## 2015-07-08 LAB — URINALYSIS, ROUTINE W REFLEX MICROSCOPIC
Bilirubin Urine: NEGATIVE
Glucose, UA: NEGATIVE mg/dL
KETONES UR: 15 mg/dL — AB
LEUKOCYTES UA: NEGATIVE
Nitrite: NEGATIVE
PROTEIN: 30 mg/dL — AB
Specific Gravity, Urine: 1.01 (ref 1.005–1.030)
pH: 7 (ref 5.0–8.0)

## 2015-07-08 LAB — URINE MICROSCOPIC-ADD ON: Bacteria, UA: NONE SEEN

## 2015-07-08 LAB — I-STAT TROPONIN, ED: TROPONIN I, POC: 0.01 ng/mL (ref 0.00–0.08)

## 2015-07-08 LAB — LIPASE, BLOOD: LIPASE: 19 U/L (ref 11–51)

## 2015-07-08 MED ORDER — ONDANSETRON HCL 4 MG/2ML IJ SOLN
4.0000 mg | Freq: Once | INTRAMUSCULAR | Status: AC
  Administered 2015-07-08: 4 mg via INTRAVENOUS
  Filled 2015-07-08: qty 2

## 2015-07-08 MED ORDER — SODIUM CHLORIDE 0.9 % IV BOLUS (SEPSIS)
1000.0000 mL | Freq: Once | INTRAVENOUS | Status: AC
  Administered 2015-07-08: 1000 mL via INTRAVENOUS

## 2015-07-08 MED ORDER — ONDANSETRON 4 MG PO TBDP: ORAL_TABLET | ORAL | Status: DC

## 2015-07-08 NOTE — ED Provider Notes (Signed)
CSN: [id number]     Arrival date & time 07/08/15  1114 History   First MD Initiated Contact with Patient 07/08/15 1132     Chief Complaint  Patient presents with  . Nausea  . Emesis  . Diarrhea     (Consider location/radiation/quality/duration/timing/severity/associated sxs/prior Treatment) The history is provided by the patient.  Bobby Krueger is a 81 y.o. male hx of COPD, HTN, CHF, DM here with nausea. Patient has been nauseated for the last 2 days. Denies any abdominal pain to me. EMS reports some vomiting and diarrhea but he denies that to me as well. He states that his son is sick with nausea vomiting and diarrhea and abdominal pain but he only has nausea. Denies any chest pain or shortness of breath.    Past Medical History  Diagnosis Date  . COPD (chronic obstructive pulmonary disease) (HCC)   . Hypertension   . High cholesterol   . CHF (congestive heart failure) (HCC)   . Diabetes mellitus without complication Bobby Krueger)    Past Surgical History  Procedure Laterality Date  . Carpal tunnel release    . Cholecystectomy    . Back surgery     No family history on file. Social History  Substance Use Topics  . Smoking status: Former Games developer  . Smokeless tobacco: None  . Alcohol Use: No    Review of Systems  Gastrointestinal: Positive for vomiting and diarrhea.  All other systems reviewed and are negative.     Allergies  Aldactone; Aspirin; Ciprofloxacin; Cyclobenzaprine; and Lexapro  Home Medications   Prior to Admission medications   Medication Sig Start Date End Date Taking? Authorizing Provider  meclizine (ANTIVERT) 25 MG tablet Take 1 tablet (25 mg total) by mouth 3 (three) times daily as needed for dizziness. 05/06/15   Bobby Sharma Covert, MD   BP 174/103 mmHg  Pulse 99  Temp(Src) 98 F (36.7 C) (Oral)  Resp 24  Ht  (1.676 m)  Wt 220 lb (99.791 kg)  BMI 35.53 kg/m2  SpO2 96% Physical Exam  Constitutional: He is oriented to person, place, and  time. He appears well-developed.  Well appearing for age   HENT:  Head: Normocephalic.  MM slightly dry   Eyes: Conjunctivae are normal. Pupils are equal, round, and reactive to light.  Neck: Normal range of motion. Neck supple.  Cardiovascular: Normal rate, regular rhythm and normal heart sounds.   Pulmonary/Chest: Effort normal and breath sounds normal. No respiratory distress. He has no wheezes. He has no rales.  Abdominal: Soft. Bowel sounds are normal. He exhibits no distension. There is no tenderness. There is no rebound.  Musculoskeletal: Normal range of motion. He exhibits no edema or tenderness.  Neurological: He is alert and oriented to person, place, and time. No cranial nerve deficit. Coordination normal.  Skin: Skin is warm and dry.  Psychiatric: He has a normal mood and affect. His behavior is normal. Judgment and thought content normal.  Nursing note and vitals reviewed.   ED Course  Procedures (including critical care time) Labs Review Labs Reviewed  COMPREHENSIVE METABOLIC PANEL - Abnormal; Notable for the following:    Glucose, Bld 164 (*)    Total Protein 8.3 (*)    Total Bilirubin 1.3 (*)    All other components within normal limits  URINALYSIS, ROUTINE W REFLEX MICROSCOPIC (NOT AT [facility]) - Abnormal; Notable for the following:    Hgb urine dipstick SMALL (*)    Ketones, ur 15 (*)  Protein, ur 30 (*)    All other components within normal limits  URINE MICROSCOPIC-ADD ON - Abnormal; Notable for the following:    Squamous Epithelial / LPF 0-5 (*)    All other components within normal limits  CBC WITH DIFFERENTIAL/PLATELET  LIPASE, BLOOD  I-STAT TROPOININ, ED    Imaging Review No results found. I have personally reviewed and evaluated these images and lab results as part of my medical decision-making.   EKG Interpretation   Date/Time:  Tuesday July 08 2015 12:19:19 EST Ventricular Rate:  85 PR Interval:  176 QRS Duration: 156 QT Interval:   449 QTC Calculation: 534 R Axis:   -124 Text Interpretation:  Sinus rhythm Nonspecific intraventricular conduction  delay Anterior infarct, old Abnormal T, consider ischemia, lateral leads  Baseline wander in lead(s) V2 No significant change since last tracing  Confirmed by Bobby Jeancharles  MD, Bobby Krueger ([id number]) on 07/08/2015 12:24:34 PM      MDM   Final diagnoses:  None    Bobby Krueger is a 81 y.o. male here with nausea. Abdomen nontender. Likely gastro. Well appearing, stable vitals. Will check labs and UA and hydrate and reassess.   3:15 PM Tolerated food and PO fluids. Will dc home with zofran prn.     Richardean Canal, MD 07/08/15 [phone]

## 2015-07-08 NOTE — Progress Notes (Signed)
Assisted pt to Restroom Pt using walker with 1 + assistance to "take a dump"

## 2015-07-08 NOTE — ED Notes (Signed)
CASE MANAGER PRESENT SPEAKING WITH PT

## 2015-07-08 NOTE — ED Notes (Signed)
MD at bedside. EDP LITTLE PRESENT  

## 2015-07-08 NOTE — ED Notes (Signed)
MD at bedside. EDP YAO 

## 2015-07-08 NOTE — ED Notes (Signed)
CHARGE ALAINA RN MADE AWARE OF PT CURRENT STATUS. [facility] TRANSPORT CANCELLED UNTIL EDP LITTLE EVALUATED PT.

## 2015-07-08 NOTE — ED Notes (Signed)
Pt assisted back into stretcher HOB 30 degrees. Pt states dizziness and shortness of breath slightly better.  Pt reports still does not feel well. Denies CP

## 2015-07-08 NOTE — Discharge Instructions (Signed)
Take zofran for nausea.   Stay hydrated.   See your doctor.   Return to ER if you have vomiting, fever, abdominal pain.

## 2015-07-08 NOTE — Progress Notes (Signed)
Pt confirms he goes to [facility] for [employer]

## 2015-07-08 NOTE — ED Notes (Signed)
Per [facility]- Pt resides at home. Pt has family members with presenting complaints of N/V/D. Pt has N/V/D with fever for 2 days.

## 2015-07-08 NOTE — ED Notes (Signed)
Bed: WHALB Expected date:  Expected time:  Means of arrival:  Comments: No bed 

## 2015-07-08 NOTE — ED Notes (Signed)
EDP Little spoke and evaluated this pt. After evaluation, pt is ok for discharge. [facility] called back for transport

## 2015-07-08 NOTE — ED Notes (Signed)
Pt only complaint is nausea

## 2015-07-08 NOTE — ED Notes (Signed)
Bed: MA26 Expected date:  Expected time:  Means of arrival:  Comments: EMS- 80s, abdominal pain x 2 weeks/n/v/d x 2 days

## 2015-11-15 ENCOUNTER — Encounter (HOSPITAL_COMMUNITY): Payer: Self-pay | Admitting: Emergency Medicine

## 2015-11-15 ENCOUNTER — Inpatient Hospital Stay (HOSPITAL_COMMUNITY)
Admission: EM | Admit: 2015-11-15 | Discharge: 2015-11-21 | DRG: 640 | Disposition: A | Discharge status: Home Health | Payer: Medicare Other | Attending: Family Medicine | Admitting: Family Medicine

## 2015-11-15 ENCOUNTER — Emergency Department (HOSPITAL_COMMUNITY): Payer: Medicare Other

## 2015-11-15 DIAGNOSIS — R42 Dizziness and giddiness: Secondary | ICD-10-CM

## 2015-11-15 DIAGNOSIS — I429 Cardiomyopathy, unspecified: Secondary | ICD-10-CM | POA: Diagnosis present

## 2015-11-15 DIAGNOSIS — D72829 Elevated white blood cell count, unspecified: Secondary | ICD-10-CM | POA: Diagnosis present

## 2015-11-15 DIAGNOSIS — Z886 Allergy status to analgesic agent status: Secondary | ICD-10-CM

## 2015-11-15 DIAGNOSIS — E785 Hyperlipidemia, unspecified: Secondary | ICD-10-CM | POA: Diagnosis present

## 2015-11-15 DIAGNOSIS — I2510 Atherosclerotic heart disease of native coronary artery without angina pectoris: Secondary | ICD-10-CM | POA: Diagnosis present

## 2015-11-15 DIAGNOSIS — I447 Left bundle-branch block, unspecified: Secondary | ICD-10-CM | POA: Diagnosis present

## 2015-11-15 DIAGNOSIS — J449 Chronic obstructive pulmonary disease, unspecified: Secondary | ICD-10-CM | POA: Diagnosis present

## 2015-11-15 DIAGNOSIS — Z794 Long term (current) use of insulin: Secondary | ICD-10-CM

## 2015-11-15 DIAGNOSIS — Z7902 Long term (current) use of antithrombotics/antiplatelets: Secondary | ICD-10-CM

## 2015-11-15 DIAGNOSIS — I110 Hypertensive heart disease with heart failure: Secondary | ICD-10-CM | POA: Diagnosis present

## 2015-11-15 DIAGNOSIS — E860 Dehydration: Secondary | ICD-10-CM | POA: Diagnosis not present

## 2015-11-15 DIAGNOSIS — R1084 Generalized abdominal pain: Secondary | ICD-10-CM

## 2015-11-15 DIAGNOSIS — N179 Acute kidney failure, unspecified: Secondary | ICD-10-CM

## 2015-11-15 DIAGNOSIS — R0902 Hypoxemia: Secondary | ICD-10-CM | POA: Diagnosis present

## 2015-11-15 DIAGNOSIS — Z881 Allergy status to other antibiotic agents status: Secondary | ICD-10-CM

## 2015-11-15 DIAGNOSIS — D696 Thrombocytopenia, unspecified: Secondary | ICD-10-CM | POA: Diagnosis present

## 2015-11-15 DIAGNOSIS — Z888 Allergy status to other drugs, medicaments and biological substances status: Secondary | ICD-10-CM

## 2015-11-15 DIAGNOSIS — E1140 Type 2 diabetes mellitus with diabetic neuropathy, unspecified: Secondary | ICD-10-CM | POA: Diagnosis present

## 2015-11-15 DIAGNOSIS — I4892 Unspecified atrial flutter: Secondary | ICD-10-CM | POA: Diagnosis present

## 2015-11-15 DIAGNOSIS — Z87891 Personal history of nicotine dependence: Secondary | ICD-10-CM

## 2015-11-15 DIAGNOSIS — I4891 Unspecified atrial fibrillation: Secondary | ICD-10-CM | POA: Diagnosis present

## 2015-11-15 DIAGNOSIS — Z79899 Other long term (current) drug therapy: Secondary | ICD-10-CM

## 2015-11-15 DIAGNOSIS — R0600 Dyspnea, unspecified: Secondary | ICD-10-CM

## 2015-11-15 DIAGNOSIS — W57XXXA Bitten or stung by nonvenomous insect and other nonvenomous arthropods, initial encounter: Secondary | ICD-10-CM | POA: Diagnosis present

## 2015-11-15 DIAGNOSIS — I5043 Acute on chronic combined systolic (congestive) and diastolic (congestive) heart failure: Secondary | ICD-10-CM | POA: Diagnosis present

## 2015-11-15 DIAGNOSIS — K219 Gastro-esophageal reflux disease without esophagitis: Secondary | ICD-10-CM | POA: Diagnosis present

## 2015-11-15 LAB — COMPREHENSIVE METABOLIC PANEL
ALBUMIN: 3.6 g/dL (ref 3.5–5.0)
ALK PHOS: 96 U/L (ref 38–126)
ALT: 16 U/L — ABNORMAL LOW (ref 17–63)
ANION GAP: 11 (ref 5–15)
AST: 23 U/L (ref 15–41)
BUN: 14 mg/dL (ref 6–20)
CALCIUM: 9.2 mg/dL (ref 8.9–10.3)
CHLORIDE: 102 mmol/L (ref 101–111)
CO2: 23 mmol/L (ref 22–32)
Creatinine, Ser: 1.44 mg/dL — ABNORMAL HIGH (ref 0.61–1.24)
GFR calc Af Amer: 49 mL/min — ABNORMAL LOW (ref >60)
GFR calc non Af Amer: 42 mL/min — ABNORMAL LOW (ref >60)
GLUCOSE: 304 mg/dL — AB (ref 65–99)
POTASSIUM: 4.4 mmol/L (ref 3.5–5.1)
SODIUM: 136 mmol/L (ref 135–145)
Total Bilirubin: 1.2 mg/dL (ref 0.3–1.2)
Total Protein: 6.9 g/dL (ref 6.5–8.1)

## 2015-11-15 LAB — URINALYSIS, ROUTINE W REFLEX MICROSCOPIC
BILIRUBIN URINE: NEGATIVE
Glucose, UA: >1000 mg/dL — AB
Ketones, ur: NEGATIVE mg/dL
Leukocytes, UA: NEGATIVE
NITRITE: NEGATIVE
Protein, ur: NEGATIVE mg/dL
SPECIFIC GRAVITY, URINE: 1.023 (ref 1.005–1.030)
pH: 5.5 (ref 5.0–8.0)

## 2015-11-15 LAB — URINE MICROSCOPIC-ADD ON

## 2015-11-15 LAB — CBC
HCT: 41.7 % (ref 39.0–52.0)
Hemoglobin: 14.1 g/dL (ref 13.0–17.0)
MCH: 30.1 pg (ref 26.0–34.0)
MCHC: 33.8 g/dL (ref 30.0–36.0)
MCV: 89.1 fL (ref 78.0–100.0)
PLATELETS: 167 10*3/uL (ref 150–400)
RBC: 4.68 MIL/uL (ref 4.22–5.81)
RDW: 12.6 % (ref 11.5–15.5)
WBC: 13 10*3/uL — AB (ref 4.0–10.5)

## 2015-11-15 LAB — BRAIN NATRIURETIC PEPTIDE: B Natriuretic Peptide: 42.7 pg/mL (ref 0.0–100.0)

## 2015-11-15 LAB — I-STAT TROPONIN, ED: Troponin i, poc: 0 ng/mL (ref 0.00–0.08)

## 2015-11-15 LAB — LIPASE, BLOOD: LIPASE: 21 U/L (ref 11–51)

## 2015-11-15 LAB — CBG MONITORING, ED: GLUCOSE-CAPILLARY: 278 mg/dL — AB (ref 65–99)

## 2015-11-15 MED ORDER — SODIUM CHLORIDE 0.9 % IV BOLUS (SEPSIS): 500.0000 mL | Freq: Once | INTRAVENOUS | Status: DC

## 2015-11-15 MED ORDER — SODIUM CHLORIDE 0.9 % IV BOLUS (SEPSIS)
500.0000 mL | Freq: Once | INTRAVENOUS | Status: AC
  Administered 2015-11-15: 500 mL via INTRAVENOUS

## 2015-11-15 MED ORDER — ONDANSETRON HCL 4 MG/2ML IJ SOLN
4.0000 mg | Freq: Once | INTRAMUSCULAR | Status: AC
  Administered 2015-11-15: 4 mg via INTRAVENOUS
  Filled 2015-11-15: qty 2

## 2015-11-15 MED ORDER — FENTANYL CITRATE (PF) 100 MCG/2ML IJ SOLN
50.0000 ug | Freq: Once | INTRAMUSCULAR | Status: AC
  Administered 2015-11-15: 50 ug via INTRAVENOUS
  Filled 2015-11-15: qty 2

## 2015-11-15 NOTE — ED Notes (Signed)
Pt brought to ED from home for generalized weakness with one time loose stool, pt states he felt like passing out. New BBB present on EMS's EKG, VS BP 118/80, HR 72, SPO2 92% on RA 100% on 2L.

## 2015-11-15 NOTE — ED Provider Notes (Signed)
CSN: [id number]     Arrival date & time 11/15/15  2041 History   First MD Initiated Contact with Patient 11/15/15 2047     Chief Complaint  Patient presents with  . Fatigue     (Consider location/radiation/quality/duration/timing/severity/associated sxs/prior Treatment) HPI 82 year old male with a history of COPD, diabetes and CHF presents with fatigue and dizziness. Patient was on the toilet and was unable to stand back off. He tells me he has been fatigued for about 2 weeks and has been lightheaded. Today when on the toilet all the symptoms got worse. There is no headache, neck pain, or chest pain. Change really felt short of breath. That has resolved. He was starting to see spots and thought he was going to pass out. Family had to help him to get him to the ambulance. He has been unable to walk. Patient has abdominal pain on exam but cannot clarify for how long he has had abdominal pain. He states "a while". Denies vomiting but states he had a loose stool today. No urinary symptoms. No back pain. Feels diffusely weak and fatigued. He has had vertigo in the past and this feels different, this feels like he's about to pass out if he stands up. Family arrived and tells me he's had very poor PO intake and has been nauseated. Taking phenergan multiple times per day.  Past Medical History  Diagnosis Date  . COPD (chronic obstructive pulmonary disease) (HCC)   . Hypertension   . High cholesterol   . CHF (congestive heart failure) (HCC)   . Diabetes mellitus without complication [facility])    Past Surgical History  Procedure Laterality Date  . Carpal tunnel release    . Cholecystectomy    . Back surgery     No family history on file. Social History  Substance Use Topics  . Smoking status: Former Games developer  . Smokeless tobacco: None  . Alcohol Use: No    Review of Systems  Constitutional: Positive for fatigue. Negative for fever.  Eyes: Negative for visual disturbance.  Respiratory: Negative  for shortness of breath.   Cardiovascular: Negative for chest pain.  Gastrointestinal: Positive for nausea (for 2 weeks), abdominal pain and diarrhea. Negative for vomiting.  Genitourinary: Negative for dysuria.  Musculoskeletal: Negative for back pain, neck pain and neck stiffness.  Neurological: Positive for light-headedness. Negative for syncope, numbness and headaches.  All other systems reviewed and are negative.     Allergies  Aldactone; Aspirin; Ciprofloxacin; Cyclobenzaprine; and Lexapro  Home Medications   Prior to Admission medications   Medication Sig Start Date End Date Taking? Authorizing Provider  meclizine (ANTIVERT) 25 MG tablet Take 1 tablet (25 mg total) by mouth 3 (three) times daily as needed for dizziness. 05/06/15   Anne-Caroline Sharma Covert, MD  ondansetron (ZOFRAN ODT) 4 MG disintegrating tablet  ODT q6 hours prn nausea/vomit 07/08/15   Richardean Canal, MD   BP 126/56 mmHg  Pulse 79  Temp(Src) 98.6 F (37 C) (Oral)  Resp 12  Ht 5' 6.5" (1.689 m)  Wt 212 lb 8.4 oz (96.4 kg)  BMI 33.79 kg/m2  SpO2 92% Physical Exam  Constitutional: He is oriented to person, place, and time. He appears well-developed and well-nourished.  HENT:  Head: Normocephalic and atraumatic.  Right Ear: External ear normal.  Left Ear: External ear normal.  Nose: Nose normal.  Eyes: Pupils are equal, round, and reactive to light. Right eye exhibits no discharge. Left eye exhibits no discharge.  Neck: Neck supple.  Cardiovascular: Normal rate, regular rhythm, normal heart sounds and intact distal pulses.   Pulmonary/Chest: Effort normal and breath sounds normal. He has no wheezes.  Abdominal: Soft. There is tenderness (diffuse). There is guarding. There is no rebound.  Musculoskeletal: He exhibits no edema.  Neurological: He is alert and oriented to person, place, and time.  5/5 strength in upper extremities. Lower extremities easily pushed down when lifted off bed but equal. Grossly  normal sensation.  Skin: Skin is warm and dry.  Nursing note and vitals reviewed.   ED Course  Procedures (including critical care time) Labs Review Labs Reviewed  CBC - Abnormal; Notable for the following:    WBC 13.0 (*)    All other components within normal limits  URINALYSIS, ROUTINE W REFLEX MICROSCOPIC (NOT AT [facility]) - Abnormal; Notable for the following:    Glucose, UA >1000 (*)    Hgb urine dipstick SMALL (*)    All other components within normal limits  COMPREHENSIVE METABOLIC PANEL - Abnormal; Notable for the following:    Glucose, Bld 304 (*)    Creatinine, Ser 1.44 (*)    ALT 16 (*)    GFR calc non Af Amer 42 (*)    GFR calc Af Amer 49 (*)    All other components within normal limits  URINE MICROSCOPIC-ADD ON - Abnormal; Notable for the following:    Squamous Epithelial / LPF 0-5 (*)    Bacteria, UA RARE (*)    Casts HYALINE CASTS (*)    All other components within normal limits  CBG MONITORING, ED - Abnormal; Notable for the following:    Glucose-Capillary 278 (*)    All other components within normal limits  BRAIN NATRIURETIC PEPTIDE  LIPASE, BLOOD  B. BURGDORFI ANTIBODIES  I-STAT TROPOININ, ED    Imaging Review Dg Chest 2 View  11/15/2015  CLINICAL DATA:  Near syncope.  Dyspnea EXAM: CHEST  2 VIEW COMPARISON:  05/06/2015 FINDINGS: Chronic cardiomegaly and aortic tortuosity. Chronic prominence of basilar lung markings, likely mild scar. There is no edema, consolidation, effusion, or pneumothorax. IMPRESSION: Stable.  No evidence of acute disease. Chronic cardiomegaly. Electronically Signed   By: Marnee Spring M.D.   On: 11/15/2015 21:56   I have personally reviewed and evaluated these images and lab results as part of my medical decision-making.   EKG Interpretation   Date/Time:  Saturday November 15 2015 20:55:39 EDT Ventricular Rate:  78 PR Interval:  211 QRS Duration: 162 QT Interval:  446 QTC Calculation: 508 R Axis:   -121 Text Interpretation:   Sinus rhythm Nonspecific intraventricular conduction  delay Borderline ST depression, lateral leads no significant change since  2016 Confirmed by Aspyn Warnke  MD, Rondey Fallen ([id number]) on 11/15/2015 10:19:29 PM      MDM   Final diagnoses:  Dizziness  Generalized abdominal pain    I currently do not have an obvious reason why the patient is unable to walk and is dizzy. He states this is different than vertigo and feels like presyncope. No chest symptoms. He does have abdominal pain although this seems to be a more chronic issue. However given that his pain also got worse to the point that he was vomiting in the ED a CT will be obtained to rule out acute emergency. After IV fentanyl he is feeling much better and is no longer vomiting. He was given Zofran for nausea but states this made his dizziness worse. Family tells me that he pulled off a tick several weeks  ago. He does not have any focal findings. He does have fatigue with weakness but no headache or neck stiffness. Discussed with neurology, Dr. Roseanne Reno, who thinks Lyme titers are reasonable but otherwise no LP or other workup currently. Patient is unable to ambulate even with assistance here. He will need to be at least admitted for overnight observation. Possibly longer depending on CT findings. Care transferred to Dr. Rhunette Croft with CT scans pending.    Pricilla Loveless, MD 11/16/15 [phone]

## 2015-11-16 ENCOUNTER — Observation Stay (HOSPITAL_COMMUNITY): Payer: Medicare Other

## 2015-11-16 ENCOUNTER — Emergency Department (HOSPITAL_COMMUNITY): Payer: Medicare Other

## 2015-11-16 DIAGNOSIS — I2510 Atherosclerotic heart disease of native coronary artery without angina pectoris: Secondary | ICD-10-CM | POA: Diagnosis present

## 2015-11-16 DIAGNOSIS — R0902 Hypoxemia: Secondary | ICD-10-CM | POA: Diagnosis present

## 2015-11-16 DIAGNOSIS — J449 Chronic obstructive pulmonary disease, unspecified: Secondary | ICD-10-CM | POA: Diagnosis present

## 2015-11-16 DIAGNOSIS — E785 Hyperlipidemia, unspecified: Secondary | ICD-10-CM | POA: Diagnosis present

## 2015-11-16 DIAGNOSIS — I447 Left bundle-branch block, unspecified: Secondary | ICD-10-CM | POA: Diagnosis present

## 2015-11-16 DIAGNOSIS — D72829 Elevated white blood cell count, unspecified: Secondary | ICD-10-CM | POA: Diagnosis present

## 2015-11-16 DIAGNOSIS — Z881 Allergy status to other antibiotic agents status: Secondary | ICD-10-CM | POA: Diagnosis not present

## 2015-11-16 DIAGNOSIS — W57XXXA Bitten or stung by nonvenomous insect and other nonvenomous arthropods, initial encounter: Secondary | ICD-10-CM | POA: Diagnosis present

## 2015-11-16 DIAGNOSIS — R1084 Generalized abdominal pain: Secondary | ICD-10-CM | POA: Diagnosis not present

## 2015-11-16 DIAGNOSIS — K219 Gastro-esophageal reflux disease without esophagitis: Secondary | ICD-10-CM | POA: Diagnosis not present

## 2015-11-16 DIAGNOSIS — E1140 Type 2 diabetes mellitus with diabetic neuropathy, unspecified: Secondary | ICD-10-CM | POA: Diagnosis present

## 2015-11-16 DIAGNOSIS — Z79899 Other long term (current) drug therapy: Secondary | ICD-10-CM | POA: Diagnosis not present

## 2015-11-16 DIAGNOSIS — N179 Acute kidney failure, unspecified: Secondary | ICD-10-CM | POA: Diagnosis present

## 2015-11-16 DIAGNOSIS — Z87891 Personal history of nicotine dependence: Secondary | ICD-10-CM | POA: Diagnosis not present

## 2015-11-16 DIAGNOSIS — R0600 Dyspnea, unspecified: Secondary | ICD-10-CM | POA: Diagnosis present

## 2015-11-16 DIAGNOSIS — I110 Hypertensive heart disease with heart failure: Secondary | ICD-10-CM | POA: Diagnosis present

## 2015-11-16 DIAGNOSIS — E860 Dehydration: Secondary | ICD-10-CM | POA: Diagnosis present

## 2015-11-16 DIAGNOSIS — I429 Cardiomyopathy, unspecified: Secondary | ICD-10-CM | POA: Diagnosis present

## 2015-11-16 DIAGNOSIS — Z794 Long term (current) use of insulin: Secondary | ICD-10-CM | POA: Diagnosis not present

## 2015-11-16 DIAGNOSIS — Z7902 Long term (current) use of antithrombotics/antiplatelets: Secondary | ICD-10-CM | POA: Diagnosis not present

## 2015-11-16 DIAGNOSIS — I4891 Unspecified atrial fibrillation: Secondary | ICD-10-CM | POA: Diagnosis present

## 2015-11-16 DIAGNOSIS — I5043 Acute on chronic combined systolic (congestive) and diastolic (congestive) heart failure: Secondary | ICD-10-CM | POA: Diagnosis present

## 2015-11-16 DIAGNOSIS — I4892 Unspecified atrial flutter: Secondary | ICD-10-CM | POA: Diagnosis not present

## 2015-11-16 DIAGNOSIS — I480 Paroxysmal atrial fibrillation: Secondary | ICD-10-CM | POA: Diagnosis not present

## 2015-11-16 DIAGNOSIS — R42 Dizziness and giddiness: Secondary | ICD-10-CM | POA: Diagnosis present

## 2015-11-16 DIAGNOSIS — Z886 Allergy status to analgesic agent status: Secondary | ICD-10-CM | POA: Diagnosis not present

## 2015-11-16 DIAGNOSIS — Z888 Allergy status to other drugs, medicaments and biological substances status: Secondary | ICD-10-CM | POA: Diagnosis not present

## 2015-11-16 DIAGNOSIS — I501 Left ventricular failure: Secondary | ICD-10-CM | POA: Diagnosis not present

## 2015-11-16 DIAGNOSIS — D696 Thrombocytopenia, unspecified: Secondary | ICD-10-CM | POA: Diagnosis present

## 2015-11-16 DIAGNOSIS — J438 Other emphysema: Secondary | ICD-10-CM | POA: Diagnosis not present

## 2015-11-16 LAB — TROPONIN I
TROPONIN I: 0.04 ng/mL — AB (ref <0.031)
TROPONIN I: 0.03 ng/mL (ref <0.031)
TROPONIN I: 0.04 ng/mL — AB (ref <0.031)
TROPONIN I: 0.03 ng/mL (ref <0.031)
Troponin I: <0.03 ng/mL (ref <0.031)

## 2015-11-16 LAB — BASIC METABOLIC PANEL
ANION GAP: 9 (ref 5–15)
BUN: 11 mg/dL (ref 6–20)
CHLORIDE: 100 mmol/L — AB (ref 101–111)
CO2: 28 mmol/L (ref 22–32)
Calcium: 8.8 mg/dL — ABNORMAL LOW (ref 8.9–10.3)
Creatinine, Ser: 1.23 mg/dL (ref 0.61–1.24)
GFR calc non Af Amer: 51 mL/min — ABNORMAL LOW (ref >60)
GFR, EST AFRICAN AMERICAN: 59 mL/min — AB (ref >60)
Glucose, Bld: 241 mg/dL — ABNORMAL HIGH (ref 65–99)
POTASSIUM: 3.8 mmol/L (ref 3.5–5.1)
SODIUM: 137 mmol/L (ref 135–145)

## 2015-11-16 LAB — MAGNESIUM: Magnesium: 1.7 mg/dL (ref 1.7–2.4)

## 2015-11-16 LAB — CBC
HCT: 38.7 % — ABNORMAL LOW (ref 39.0–52.0)
Hemoglobin: 13.4 g/dL (ref 13.0–17.0)
MCH: 30.7 pg (ref 26.0–34.0)
MCHC: 34.6 g/dL (ref 30.0–36.0)
MCV: 88.6 fL (ref 78.0–100.0)
PLATELETS: 160 10*3/uL (ref 150–400)
RBC: 4.37 MIL/uL (ref 4.22–5.81)
RDW: 12.5 % (ref 11.5–15.5)
WBC: 8.7 10*3/uL (ref 4.0–10.5)

## 2015-11-16 LAB — PHOSPHORUS: Phosphorus: 4.3 mg/dL (ref 2.5–4.6)

## 2015-11-16 LAB — GLUCOSE, CAPILLARY
GLUCOSE-CAPILLARY: 220 mg/dL — AB (ref 65–99)
Glucose-Capillary: 233 mg/dL — ABNORMAL HIGH (ref 65–99)
Glucose-Capillary: 255 mg/dL — ABNORMAL HIGH (ref 65–99)

## 2015-11-16 LAB — CREATININE, URINE, RANDOM: CREATININE, URINE: 140.62 mg/dL

## 2015-11-16 LAB — CBG MONITORING, ED
GLUCOSE-CAPILLARY: 218 mg/dL — AB (ref 65–99)
Glucose-Capillary: 228 mg/dL — ABNORMAL HIGH (ref 65–99)

## 2015-11-16 LAB — MRSA PCR SCREENING: MRSA BY PCR: NEGATIVE

## 2015-11-16 MED ORDER — IOPAMIDOL (ISOVUE-300) INJECTION 61%
INTRAVENOUS | Status: AC
  Administered 2015-11-16: 80 mL
  Filled 2015-11-16: qty 100

## 2015-11-16 MED ORDER — INSULIN GLARGINE 100 UNIT/ML ~~LOC~~ SOLN: 62.0000 [IU] | Freq: Every day | SUBCUTANEOUS | Status: DC

## 2015-11-16 MED ORDER — ENOXAPARIN SODIUM 40 MG/0.4ML ~~LOC~~ SOLN
40.0000 mg | Freq: Every day | SUBCUTANEOUS | Status: DC
  Administered 2015-11-17 – 2015-11-19 (×3): 40 mg via SUBCUTANEOUS
  Filled 2015-11-16 (×5): qty 0.4

## 2015-11-16 MED ORDER — LATANOPROST 0.005 % OP SOLN
1.0000 [drp] | Freq: Every day | OPHTHALMIC | Status: DC
  Administered 2015-11-16 – 2015-11-20 (×5): 1 [drp] via OPHTHALMIC
  Filled 2015-11-16: qty 2.5

## 2015-11-16 MED ORDER — POLYVINYL ALCOHOL 1.4 % OP SOLN: 1.0000 [drp] | OPHTHALMIC | Status: DC | PRN

## 2015-11-16 MED ORDER — SIMVASTATIN 40 MG PO TABS
40.0000 mg | ORAL_TABLET | Freq: Every evening | ORAL | Status: DC
  Administered 2015-11-16 – 2015-11-20 (×5): 40 mg via ORAL
  Filled 2015-11-16 (×6): qty 1

## 2015-11-16 MED ORDER — ZOLPIDEM TARTRATE 5 MG PO TABS: 10.0000 mg | ORAL_TABLET | Freq: Every evening | ORAL | Status: DC | PRN

## 2015-11-16 MED ORDER — INSULIN GLARGINE 100 UNIT/ML ~~LOC~~ SOLN
31.0000 [IU] | Freq: Every day | SUBCUTANEOUS | Status: DC
  Administered 2015-11-16 – 2015-11-20 (×5): 31 [IU] via SUBCUTANEOUS
  Filled 2015-11-16 (×8): qty 0.31

## 2015-11-16 MED ORDER — ACETAMINOPHEN 325 MG PO TABS: 650.0000 mg | ORAL_TABLET | Freq: Four times a day (QID) | ORAL | Status: DC | PRN

## 2015-11-16 MED ORDER — CLOPIDOGREL BISULFATE 75 MG PO TABS
75.0000 mg | ORAL_TABLET | Freq: Every day | ORAL | Status: DC
  Administered 2015-11-16 – 2015-11-19 (×4): 75 mg via ORAL
  Filled 2015-11-16 (×4): qty 1

## 2015-11-16 MED ORDER — PANTOPRAZOLE SODIUM 40 MG PO TBEC
40.0000 mg | DELAYED_RELEASE_TABLET | Freq: Every day | ORAL | Status: DC
  Administered 2015-11-16 – 2015-11-21 (×6): 40 mg via ORAL
  Filled 2015-11-16 (×6): qty 1

## 2015-11-16 MED ORDER — INSULIN ASPART 100 UNIT/ML ~~LOC~~ SOLN
0.0000 [IU] | Freq: Three times a day (TID) | SUBCUTANEOUS | Status: DC
  Administered 2015-11-16 (×3): 3 [IU] via SUBCUTANEOUS
  Administered 2015-11-17: 2 [IU] via SUBCUTANEOUS
  Administered 2015-11-17: 3 [IU] via SUBCUTANEOUS
  Administered 2015-11-17: 2 [IU] via SUBCUTANEOUS
  Administered 2015-11-18: 1 [IU] via SUBCUTANEOUS
  Administered 2015-11-18 (×2): 2 [IU] via SUBCUTANEOUS
  Filled 2015-11-16 (×2): qty 1

## 2015-11-16 MED ORDER — BRIMONIDINE TARTRATE 0.2 % OP SOLN
1.0000 [drp] | Freq: Every day | OPHTHALMIC | Status: DC
  Administered 2015-11-16 – 2015-11-21 (×6): 1 [drp] via OPHTHALMIC
  Filled 2015-11-16: qty 5

## 2015-11-16 MED ORDER — ACETAMINOPHEN 650 MG RE SUPP
650.0000 mg | Freq: Four times a day (QID) | RECTAL | Status: DC | PRN
Start: 2015-11-16 — End: 2015-11-21

## 2015-11-16 MED ORDER — TERAZOSIN HCL 5 MG PO CAPS
5.0000 mg | ORAL_CAPSULE | Freq: Every evening | ORAL | Status: DC
  Administered 2015-11-17 – 2015-11-20 (×4): 5 mg via ORAL
  Filled 2015-11-16 (×7): qty 1

## 2015-11-16 MED ORDER — PROMETHAZINE HCL 25 MG PO TABS: 12.5000 mg | ORAL_TABLET | ORAL | Status: DC | PRN

## 2015-11-16 MED ORDER — TIOTROPIUM BROMIDE MONOHYDRATE 18 MCG IN CAPS
18.0000 ug | ORAL_CAPSULE | Freq: Every day | RESPIRATORY_TRACT | Status: DC
  Administered 2015-11-16 – 2015-11-21 (×6): 18 ug via RESPIRATORY_TRACT
  Filled 2015-11-16: qty 5

## 2015-11-16 MED ORDER — ONDANSETRON HCL 4 MG/2ML IJ SOLN: 4.0000 mg | Freq: Four times a day (QID) | INTRAMUSCULAR | Status: DC | PRN

## 2015-11-16 MED ORDER — CARVEDILOL 12.5 MG PO TABS
12.5000 mg | ORAL_TABLET | Freq: Two times a day (BID) | ORAL | Status: DC
  Administered 2015-11-16 – 2015-11-21 (×11): 12.5 mg via ORAL
  Filled 2015-11-16 (×11): qty 1

## 2015-11-16 MED ORDER — ONDANSETRON HCL 4 MG PO TABS
4.0000 mg | ORAL_TABLET | Freq: Four times a day (QID) | ORAL | Status: DC | PRN
Start: 2015-11-16 — End: 2015-11-21
  Administered 2015-11-17: 4 mg via ORAL
  Filled 2015-11-16: qty 1

## 2015-11-16 MED ORDER — GABAPENTIN 300 MG PO CAPS
300.0000 mg | ORAL_CAPSULE | Freq: Every day | ORAL | Status: DC
  Administered 2015-11-16 – 2015-11-20 (×5): 300 mg via ORAL
  Filled 2015-11-16 (×5): qty 1

## 2015-11-16 MED ORDER — SODIUM CHLORIDE 0.9% FLUSH
3.0000 mL | Freq: Two times a day (BID) | INTRAVENOUS | Status: DC
  Administered 2015-11-16 – 2015-11-21 (×9): 3 mL via INTRAVENOUS

## 2015-11-16 MED ORDER — IPRATROPIUM-ALBUTEROL 0.5-2.5 (3) MG/3ML IN SOLN
3.0000 mL | Freq: Four times a day (QID) | RESPIRATORY_TRACT | Status: DC | PRN
  Administered 2015-11-16: 3 mL via RESPIRATORY_TRACT
  Filled 2015-11-16: qty 3

## 2015-11-16 MED ORDER — OMEGA-3-ACID ETHYL ESTERS 1 G PO CAPS
1000.0000 mg | ORAL_CAPSULE | Freq: Every day | ORAL | Status: DC
  Administered 2015-11-17 – 2015-11-21 (×5): 1000 mg via ORAL
  Filled 2015-11-16 (×7): qty 1

## 2015-11-16 MED ORDER — SODIUM CHLORIDE 0.9 % IV SOLN
INTRAVENOUS | Status: DC
  Administered 2015-11-16: 18:00:00 via INTRAVENOUS
  Administered 2015-11-16: 75 mL/h via INTRAVENOUS
  Administered 2015-11-17: 09:00:00 via INTRAVENOUS

## 2015-11-16 MED ORDER — FLUTICASONE FUROATE-VILANTEROL 200-25 MCG/INH IN AEPB
1.0000 | INHALATION_SPRAY | Freq: Every day | RESPIRATORY_TRACT | Status: DC
  Administered 2015-11-16 – 2015-11-21 (×5): 1 via RESPIRATORY_TRACT
  Filled 2015-11-16: qty 28

## 2015-11-16 NOTE — ED Notes (Signed)
Patient transported to CT 

## 2015-11-16 NOTE — ED Notes (Signed)
Pt returned from xray

## 2015-11-16 NOTE — ED Notes (Signed)
hospitalist at the bedside 

## 2015-11-16 NOTE — Progress Notes (Signed)
Pt seen and examined at bedside, still hypoxic, change oxygen to NRB, change admission status to SDU inpatient. Please earlier note by Dr. Katrinka Blazing.   Debbora Presto, MD  [facility] Pager [phone]  If 7PM-7AM, please contact night-coverage [email or web address] Password [email or web address]

## 2015-11-16 NOTE — ED Notes (Signed)
Off unit with xray. 

## 2015-11-16 NOTE — H&P (Addendum)
History and Physical    JACQUIS PAXTON UJW:[id number] DOB: 01-Mar-1927 DOA: 11/15/2015  Referring MD/NP/PA: Dr. Rhunette Croft PCP: [facility]   Outpatient Specialists: -- Patient coming from: Home   Chief Complaint: Weakness and lightheadedness  HPI: Bobby Krueger is a 82 y.o. male with medical history significant of COPD, diabetes mellitus type 2, HTN, HLD, CHF; who presents with generalized weakness and complaints of being lightheaded. Symptoms of fatigue have been going on over the last 2 weeks, but symptoms worsened to the point which he was unable to get around like he normally does. Yesterday while using the bathroom he began seeing spots like he was about to pass out. Changes in position makes him feel similarly. Denies any loss of consciousness, leg swelling, or chest pain. Associated symptoms include loose stools, nausea, vomiting at least 1, abdominal pain, and chronic shortness of breath. He states that he's been eating and drinking fine, but family previously reported that patient has had poor oral intake. He gives history of recently finding a tick on his abdomen as well as his back that possibly had been there 1-2 days, but states that the size of the ticks were less than a centimeter.  ED Course: Upon arrival to the emergency department patient was evaluated with a CT scan of the brain which showed no acute abnormalities, and a CT of the abdomen which showed no acute abnormalities. Lab work revealed WBC of 13, creatinine of 1.44, BUN of 14, and a glucose of 304.: The ED was noted that the patient had intermittent episodes with it appears to be NSVT as intermittently noted to have heart rates as high as 176, 165, and 153 when reviewing the flow chart.  Review of Systems: As per HPI otherwise 10 point review of systems negative.    Past Medical History  Diagnosis Date  . COPD (chronic obstructive pulmonary disease) (HCC)   . Hypertension   . High cholesterol   . CHF  (congestive heart failure) (HCC)   . Diabetes mellitus without complication [facility])     Past Surgical History  Procedure Laterality Date  . Carpal tunnel release    . Cholecystectomy    . Back surgery       reports that he has quit smoking. He does not have any smokeless tobacco history on file. He reports that he does not drink alcohol or use illicit drugs.  Allergies  Allergen Reactions  . Aldactone [Spironolactone] Hives  . Aspirin Hives  . Ciprofloxacin Hives  . Cyclobenzaprine Hives  . Lexapro [Escitalopram] Hives    No family history on file.  Prior to Admission medications   Medication Sig Start Date End Date Taking? Authorizing Provider  albuterol (PROAIR HFA) 108 (90 Base) MCG/ACT inhaler Inhale 2 puffs into the lungs every 4 (four) hours as needed.   Yes Historical Provider, MD  brimonidine (ALPHAGAN) 0.2 % ophthalmic solution Place 1 drop into both eyes daily.    Yes Historical Provider, MD  carvedilol (COREG) 12.5 MG tablet Take 12.5 mg by mouth 2 (two) times daily.   Yes Historical Provider, MD  clopidogrel (PLAVIX) 75 MG tablet Take 75 mg by mouth daily.   Yes Historical Provider, MD  Fluticasone-Salmeterol (ADVAIR DISKUS) 250-50 MCG/DOSE AEPB Inhale 1 puff into the lungs 2 (two) times daily.   Yes Historical Provider, MD  furosemide (LASIX) 40 MG tablet Take 40 mg by mouth daily as needed for fluid or edema.    Yes Historical Provider, MD  gabapentin (NEURONTIN) 300 MG capsule Take 300 mg by mouth at bedtime.   Yes Historical Provider, MD  hydrochlorothiazide (HYDRODIURIL) 25 MG tablet Take 12.5 mg by mouth daily.   Yes Historical Provider, MD  ibuprofen (ADVIL,MOTRIN) 200 MG tablet Take 400 mg by mouth every 6 (six) hours as needed for moderate pain.    Yes Historical Provider, MD  insulin glargine (LANTUS) 100 UNIT/ML injection Inject 62 Units into the skin at bedtime.   Yes Historical Provider, MD  ipratropium-albuterol (DUONEB) 0.5-2.5 (3) MG/3ML SOLN Take 3 mLs  by nebulization every 6 (six) hours as needed (for SOB).   Yes Historical Provider, MD  losartan (COZAAR) 100 MG tablet Take 100 mg by mouth daily. 10/02/15  Yes Historical Provider, MD  nateglinide (STARLIX) 120 MG tablet Take 120 mg by mouth daily. 10/02/15  Yes Historical Provider, MD  Omega-3 1000 MG CAPS Take 1 g by mouth daily.   Yes Historical Provider, MD  omeprazole (PRILOSEC) 20 MG capsule Take 20 mg by mouth 2 (two) times daily.   Yes Historical Provider, MD  ondansetron (ZOFRAN ODT) 4 MG disintegrating tablet 4mg  ODT q6 hours prn nausea/vomit 07/08/15  Yes Richardean Canal, MD  polyvinyl alcohol (LIQUIFILM TEARS) 1.4 % ophthalmic solution Place 1 drop into both eyes as needed for dry eyes.   Yes Historical Provider, MD  potassium chloride SA (K-DUR,KLOR-CON) 20 MEQ tablet Take 20 mEq by mouth daily.   Yes Historical Provider, MD  promethazine (PHENERGAN) 25 MG tablet Take 12.5-25 mg by mouth every 4 (four) hours as needed.   Yes Historical Provider, MD  simvastatin (ZOCOR) 40 MG tablet Take 40 mg by mouth every evening.   Yes Historical Provider, MD  SPIRIVA HANDIHALER 18 MCG inhalation capsule Place 1 application into inhaler and inhale daily. 10/13/15  Yes Historical Provider, MD  terazosin (HYTRIN) 5 MG capsule Take 5 mg by mouth every evening.   Yes Historical Provider, MD  triamcinolone cream (KENALOG) 0.1 % Apply 1 application topically 2 (two) times daily as needed (for irritation).   Yes Historical Provider, MD  zolpidem (AMBIEN) 10 MG tablet Take 10 mg by mouth at bedtime as needed for sleep.    Yes Historical Provider, MD  latanoprost (XALATAN) 0.005 % ophthalmic solution Place 1 drop into both eyes at bedtime. Reported on 11/15/2015    Historical Provider, MD  meclizine (ANTIVERT) 25 MG tablet Take 1 tablet (25 mg total) by mouth 3 (three) times daily as needed for dizziness. 05/06/15   Rockne Menghini, MD    Physical Exam: Filed Vitals:   11/16/15 0345 11/16/15 0400 11/16/15  0415 11/16/15 0430  BP: 131/69 121/76 126/79 126/76  Pulse: 90 87 88 176  Temp:      TempSrc:      Resp: 22 17 23 25   Height:      Weight:      SpO2: 90% 100% 100% 100%      Constitutional: Obese elderly male who appears ill  Filed Vitals:   11/16/15 0345 11/16/15 0400 11/16/15 0415 11/16/15 0430  BP: 131/69 121/76 126/79 126/76  Pulse: 90 87 88 176  Temp:      TempSrc:      Resp: 22 17 23 25   Height:      Weight:      SpO2: 90% 100% 100% 100%   Eyes: PERRL, lids and conjunctivae normal ENMT: Mucous membranes are moist. Posterior pharynx clear of any exudate or lesions.Normal dentition.  Neck: normal, supple, no  masses, no thyromegaly Respiratory: clear to auscultation bilaterally, no wheezing, no crackles. Normal respiratory effort. No accessory muscle use.  Cardiovascular:Regular rate and rhythm with intermittent frequent PVCs  No extremity edema. 2+ pedal pulses. No carotid bruits.  Abdomen:Mild generalized tenderness, no masses palpated. No hepatosplenomegaly. Bowel sounds positive.  Musculoskeletal: no clubbing / cyanosis. No joint deformity upper and lower extremities. Good ROM, no contractures. Normal muscle tone.  Skin: no rashes, lesions, ulcers. No induration Neurologic: CN 2-12 grossly intact. Sensation intact, DTR normal. Strength 4+/5 inthe bilateral lower extremities and 5/5 throughout the bilateral upper.  Psychiatric: Normal judgment and insight. Alert and oriented x 3. Normal mood.   ( Labs on Admission: I have personally reviewed following labs and imaging studies  CBC:  Recent Labs Lab 11/15/15 2107  WBC 13.0*  HGB 14.1  HCT 41.7  MCV 89.1  PLT 167   Basic Metabolic Panel:  Recent Labs Lab 11/15/15 2107  NA 136  K 4.4  CL 102  CO2 23  GLUCOSE 304*  BUN 14  CREATININE 1.44*  CALCIUM 9.2   GFR: Estimated Creatinine Clearance: 39.7 mL/min (by C-G formula based on Cr of 1.44). Liver Function Tests:  Recent Labs Lab 11/15/15 2107    AST 23  ALT 16*  ALKPHOS 96  BILITOT 1.2  PROT 6.9  ALBUMIN 3.6    Recent Labs Lab 11/15/15 2107  LIPASE 21   No results for input(s): AMMONIA in the last 168 hours. Coagulation Profile: No results for input(s): INR, PROTIME in the last 168 hours. Cardiac Enzymes: No results for input(s): CKTOTAL, CKMB, CKMBINDEX, TROPONINI in the last 168 hours. BNP (last 3 results) No results for input(s): PROBNP in the last 8760 hours. HbA1C: No results for input(s): HGBA1C in the last 72 hours. CBG:  Recent Labs Lab 11/15/15 2101  GLUCAP 278*   Lipid Profile: No results for input(s): CHOL, HDL, LDLCALC, TRIG, CHOLHDL, LDLDIRECT in the last 72 hours. Thyroid Function Tests: No results for input(s): TSH, T4TOTAL, FREET4, T3FREE, THYROIDAB in the last 72 hours. Anemia Panel: No results for input(s): VITAMINB12, FOLATE, FERRITIN, TIBC, IRON, RETICCTPCT in the last 72 hours. Urine analysis:    Component Value Date/Time   COLORURINE YELLOW 11/15/2015 2306   COLORURINE Yellow 06/05/2013 1502   APPEARANCEUR CLEAR 11/15/2015 2306   APPEARANCEUR Clear 06/05/2013 1502   LABSPEC 1.023 11/15/2015 2306   LABSPEC 1.012 06/05/2013 1502   PHURINE 5.5 11/15/2015 2306   PHURINE 7.0 06/05/2013 1502   GLUCOSEU >1000* 11/15/2015 2306   GLUCOSEU 50 mg/dL 40/98/1191 4782   HGBUR SMALL* 11/15/2015 2306   HGBUR Negative 06/05/2013 1502   BILIRUBINUR NEGATIVE 11/15/2015 2306   BILIRUBINUR Negative 06/05/2013 1502   KETONESUR NEGATIVE 11/15/2015 2306   KETONESUR Negative 06/05/2013 1502   PROTEINUR NEGATIVE 11/15/2015 2306   PROTEINUR Negative 06/05/2013 1502   NITRITE NEGATIVE 11/15/2015 2306   NITRITE Negative 06/05/2013 1502   LEUKOCYTESUR NEGATIVE 11/15/2015 2306   LEUKOCYTESUR Negative 06/05/2013 1502   Sepsis Labs: @LABRCNTIP (procalcitonin:4,lacticidven:4) )No results found for this or any previous visit (from the past 240 hour(s)).   Radiological Exams on Admission: Dg Chest 2  View  11/15/2015  CLINICAL DATA:  Near syncope.  Dyspnea EXAM: CHEST  2 VIEW COMPARISON:  05/06/2015 FINDINGS: Chronic cardiomegaly and aortic tortuosity. Chronic prominence of basilar lung markings, likely mild scar. There is no edema, consolidation, effusion, or pneumothorax. IMPRESSION: Stable.  No evidence of acute disease. Chronic cardiomegaly. Electronically Signed   By: Kathrynn Ducking.D.  On: 11/15/2015 21:56   Ct Head Wo Contrast  11/16/2015  CLINICAL DATA:  Dizziness and difficulty walking. History of hypertension and diabetes. EXAM: CT HEAD WITHOUT CONTRAST TECHNIQUE: Contiguous axial images were obtained from the base of the skull through the vertex without intravenous contrast. COMPARISON:  CT HEAD May 06, 2015 FINDINGS: INTRACRANIAL CONTENTS: The ventricles and sulci are normal for age. No intraparenchymal hemorrhage, mass effect nor midline shift. Patchy supratentorial white matter hypodensities are within normal range for patient's age and though non-specific likely represent chronic small vessel ischemic disease. No acute large vascular territory infarcts. No abnormal extra-axial fluid collections. Basal cisterns are patent. Moderate calcific atherosclerosis of the carotid siphons. ORBITS: The included ocular globes and orbital contents are non-suspicious. Status post LEFT ocular lens implant. SINUSES: Minimal LEFT mastoid effusion. Trace paranasal sinus mucosal thickening. Bilateral hearing aids in place, if MRI is performed the should be removed. SKULL/SOFT TISSUES: No skull fracture. No significant soft tissue swelling. Small LEFT occipital scalp lipoma. IMPRESSION: No acute intracranial process; negative CT HEAD for age. Electronically Signed   By: Awilda Metro M.D.   On: 11/16/2015 02:36   Ct Abdomen Pelvis W Contrast  11/16/2015  CLINICAL DATA:  Diffuse abdominal pain EXAM: CT ABDOMEN AND PELVIS WITH CONTRAST TECHNIQUE: Multidetector CT imaging of the abdomen and pelvis was  performed using the standard protocol following bolus administration of intravenous contrast. CONTRAST:  80 cc Isovue 300 intravenous COMPARISON:  06/05/2013 FINDINGS: Lower chest and abdominal wall: Fatty enlargement of the bilateral inguinal canal. Stable simple lipoma extending ventral to the right iliopsoas. Cardiomegaly. Bilateral dependent atelectasis. Left lower lobe calcified granuloma Hepatobiliary: No focal liver abnormality.Cholecystectomy with negative common bile duct. Pancreas: Fatty atrophy without acute finding Spleen: Unremarkable. Adrenals/Urinary Tract: Negative adrenals. No hydronephrosis or stone. Symmetric mild renal cortical thinning. No acute bladder finding. Mild bladder wall thickening likely from chronic outlet obstruction Reproductive:Symmetric prostate enlargement Stomach/Bowel: No obstruction. No appendicitis. Colonic diverticulosis. Vascular/Lymphatic: No acute vascular abnormality. No mass or adenopathy. Peritoneal: No ascites or pneumoperitoneum. Musculoskeletal: No acute finding. Advanced and diffuse disc and facet degeneration status post L4-5 discectomy. Multiple Schmorl's nodes are chronic. L2 hemangioma. IMPRESSION: 1. Stable since 2014.  No acute finding to explain abdominal pain. 2. Chronic findings are described above. Electronically Signed   By: Marnee Spring M.D.   On: 11/16/2015 03:03    EKG: Independently reviewed. Sinus rhythm with a nonspecific intraventricular conduction delay  Assessment/Plan Dehydration: Patient complains of generalized weakness and episode of near syncope. Patient orthostatic on admission. Received 1 L while in the ED of IV fluids - Admit to a telemetry bed  - IV fluids 75 mL per hour - Recheck orthostatic vital signs  AKI (acute kidney injury): Creatinine elevated at 1.44 with BUN of 14 - Check FeUr - Gentle IV fluids as seen above - Recheck BMP in a.m. - hold nephrotoxic agents  Leukocytosis WBC of 13 suspect that this could be  Secondary to underlying infection versus dehydration  - Repeat CBC in a.m.   Arrhythmia/Question of NSVT:Patient seen have some abnormal heart rates while reading flowchart  - Follow-up telemetry  - Checking electrolytes and replace as needed  Diabetes mellitus type 2 with hypoglycemia: Blood glucose elevated at 302 - Carb modified diet - Continue 1/2 home dose of Lantus 62 units - CBGs with meals with sensitive sliding scale of insulin  Abdominal pain: Resolving. CT scan of abdomen showed no acute signs of any abnormality.  CAD with history of  congestive heart failure - Continue Plavix, Coreg  Essential hypertension - Holding possible nephrotoxic agents  COPD - Continue Spiriva, pharmacy substitution of Breo for Advair - DuoNeb's when necessary   Dyslipidemia - Continue simvastatin  Tick bite: Patient notes history of tick bite. -  Question need of further investigation  GERD - Pharmacy substitution for Protonix  DVT prophylaxis: Lovenox  Code Status: Full  Family Communication: None Disposition Plan: Undetermined  Consults called: none Admission status: Telemetry observation  Clydie Braun MD [facility] Pager [phone]  If 7PM-7AM, please contact night-coverage [email or web address] Password [email or web address]  11/16/2015, 4:59 AM

## 2015-11-16 NOTE — ED Notes (Signed)
Attempting to call admitting MD

## 2015-11-16 NOTE — ED Notes (Signed)
MD made aware of pt new chest pain, step down orders added.

## 2015-11-16 NOTE — ED Notes (Signed)
Ambulation attempted, pt states he is to sick to walk and that is the reason that he decided to come to ED to find some help. Unable to stand or walk at home for the past few days. EDP notified.

## 2015-11-16 NOTE — ED Notes (Signed)
Pt having some SPO2 on the low 80% pt placed on the [facility] and still show low 80%, respiratory therapist at the bedside, pt encouraged to cough and take deep bread pt back to the up 90%.

## 2015-11-16 NOTE — ED Notes (Signed)
Pt ready low 90% SPO2 on cardiac monitor,  Portable SPO2 used and pt is 98% on 3L [place].

## 2015-11-17 DIAGNOSIS — D72829 Elevated white blood cell count, unspecified: Secondary | ICD-10-CM

## 2015-11-17 DIAGNOSIS — J438 Other emphysema: Secondary | ICD-10-CM

## 2015-11-17 LAB — GLUCOSE, CAPILLARY
GLUCOSE-CAPILLARY: 182 mg/dL — AB (ref 65–99)
GLUCOSE-CAPILLARY: 194 mg/dL — AB (ref 65–99)
GLUCOSE-CAPILLARY: 171 mg/dL — AB (ref 65–99)
Glucose-Capillary: 187 mg/dL — ABNORMAL HIGH (ref 65–99)
Glucose-Capillary: 201 mg/dL — ABNORMAL HIGH (ref 65–99)

## 2015-11-17 LAB — UREA NITROGEN, URINE: Urea Nitrogen, Ur: 447 mg/dL

## 2015-11-17 LAB — STREP PNEUMONIAE URINARY ANTIGEN: STREP PNEUMO URINARY ANTIGEN: NEGATIVE

## 2015-11-17 LAB — TROPONIN I: Troponin I: <0.03 ng/mL (ref <0.031)

## 2015-11-17 MED ORDER — DIPHENHYDRAMINE HCL 25 MG PO CAPS: 25.0000 mg | ORAL_CAPSULE | Freq: Three times a day (TID) | ORAL | Status: DC | PRN

## 2015-11-17 MED ORDER — AZITHROMYCIN 500 MG IV SOLR
500.0000 mg | INTRAVENOUS | Status: DC
  Administered 2015-11-17: 500 mg via INTRAVENOUS
  Filled 2015-11-17: qty 500

## 2015-11-17 MED ORDER — IPRATROPIUM-ALBUTEROL 0.5-2.5 (3) MG/3ML IN SOLN: 3.0000 mL | RESPIRATORY_TRACT | Status: DC | PRN

## 2015-11-17 MED ORDER — DEXTROSE 5 % IV SOLN
1.0000 g | INTRAVENOUS | Status: DC
  Administered 2015-11-17: 1 g via INTRAVENOUS
  Filled 2015-11-17 (×2): qty 10

## 2015-11-17 MED ORDER — DOXYCYCLINE HYCLATE 100 MG PO TABS
100.0000 mg | ORAL_TABLET | Freq: Two times a day (BID) | ORAL | Status: DC
  Administered 2015-11-17 – 2015-11-21 (×9): 100 mg via ORAL
  Filled 2015-11-17 (×9): qty 1

## 2015-11-17 MED ORDER — CETYLPYRIDINIUM CHLORIDE 0.05 % MT LIQD
7.0000 mL | Freq: Two times a day (BID) | OROMUCOSAL | Status: DC
  Administered 2015-11-17 – 2015-11-21 (×8): 7 mL via OROMUCOSAL

## 2015-11-17 NOTE — Progress Notes (Signed)
Patient ID: Bobby Krueger, male   DOB: 1926/09/09, 82 y.o.   MRN: [id number]    PROGRESS NOTE    Bobby Krueger  NWG:[id number] DOB: 1927/01/31 DOA: 11/15/2015  PCP: [facility]   Outpatient Specialists: Unknown   Brief Narrative:  82 y.o. male with COPD, diabetes mellitus type 2, HTN, HLD, CHF who presented to [facility] ED with main concern of progressively worsening fatigue and weakness, dyspnea that has been present with exertion and occasionally at rest, associated with poor oral intake, mixed episodes of non productive and productive cough of yellow sputum. He also reports history of recently finding a tick on his abdomen as well as his back that possibly had been there 1-2 days, but states that the size of the ticks were less than a centimeter.  Assessment & Plan:   Principal Problem:   Acute respiratory distress with hypoxia - requiring NRB in ED to maintain oxygen saturations > 90% - this has also required change in admission status to inpatient and placement to SDU for closer monitoring - this has been contributed to AECOD and ? 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   Objective: Filed Vitals:   11/17/15 0800 11/17/15 0923 11/17/15 1200 11/17/15 1540  BP: 129/55 119/57 111/53 123/59  Pulse: 68 65  87  Temp: 98 F (36.7 C)  98.5 F (36.9 C) 98.4 F (36.9 C)  TempSrc: Oral  Oral Oral  Resp: 18  22 16   Height:      Weight:      SpO2: 97%  99% 92%    Intake/Output Summary (Last 24 hours) at 11/17/15 1555 Last data filed at 11/17/15 1500  Gross per 24 hour  Intake   1418 ml  Output    800 ml  Net    618 ml   Filed Weights   11/15/15 2048 11/16/15 1758  Weight: 96.4 kg (212 lb 8.4 oz) 95.2 kg (209 lb 14.1 oz)    Examination:  General exam: Appears calm and comfortable  Respiratory system: Diminished breath sounds at bases, no wheezing, dullness to percussion at bases  Cardiovascular system: S1 & S2 heard, RRR. No JVD, murmurs, rubs, gallops or clicks. No pedal edema. Gastrointestinal system: Abdomen is nondistended, soft and nontender.  Central nervous system: Alert and oriented. No focal neurological deficits. Extremities: Symmetric 5 x 5 power. Skin: No rashes, lesions or ulcers  Data Reviewed: I have personally reviewed following labs and imaging studies  CBC:  Recent Labs Lab  11/15/15 2107 11/16/15 0535  WBC 13.0* 8.7  HGB 14.1 13.4  HCT 41.7 38.7*  MCV 89.1 88.6  PLT 167 160   Basic Metabolic Panel:  Recent Labs Lab 11/15/15 2107 11/16/15 0535  NA 136 137  K 4.4 3.8  CL 102 100*  CO2 23 28  GLUCOSE 304* 241*  BUN 14 11  CREATININE 1.44* 1.23  CALCIUM 9.2 8.8*  MG  --  1.7  PHOS  --  4.3   Liver Function Tests:  Recent Labs Lab 11/15/15 2107  AST 23  ALT 16*  ALKPHOS 96  BILITOT 1.2  PROT 6.9  ALBUMIN 3.6    Recent Labs Lab 11/15/15 2107  LIPASE 21   Cardiac Enzymes:  Recent Labs Lab 11/16/15 0535 11/16/15 0907 11/16/15 1645 11/16/15 2053 11/17/15 0329  TROPONINI 0.03 0.04* <0.03 0.03  <0.03   CBG:  Recent Labs Lab 11/16/15 1832 11/16/15 1953 11/16/15 2224 11/17/15 0852 11/17/15 1130  GLUCAP 220* 233* 255* 187* 182*   Urine analysis:    Component Value Date/Time   COLORURINE YELLOW 11/15/2015 2306   COLORURINE Yellow 06/05/2013 1502   APPEARANCEUR CLEAR 11/15/2015 2306   APPEARANCEUR Clear 06/05/2013 1502   LABSPEC 1.023 11/15/2015 2306   LABSPEC 1.012 06/05/2013 1502   PHURINE 5.5 11/15/2015 2306   PHURINE 7.0 06/05/2013 1502   GLUCOSEU >1000* 11/15/2015 2306   GLUCOSEU 50 mg/dL 40/98/1191 4782   HGBUR SMALL* 11/15/2015 2306   HGBUR Negative 06/05/2013 1502   BILIRUBINUR NEGATIVE 11/15/2015 2306   BILIRUBINUR Negative 06/05/2013 1502   KETONESUR NEGATIVE 11/15/2015 2306   KETONESUR Negative 06/05/2013 1502   PROTEINUR NEGATIVE 11/15/2015 2306   PROTEINUR Negative 06/05/2013 1502   NITRITE NEGATIVE 11/15/2015 2306   NITRITE Negative 06/05/2013 1502   LEUKOCYTESUR NEGATIVE 11/15/2015 2306   LEUKOCYTESUR Negative 06/05/2013 1502   Recent Results (from the past 240 hour(s))  MRSA PCR Screening     Status: None   Collection Time: 11/16/15  6:30 PM  Result Value Ref Range Status   MRSA by PCR NEGATIVE NEGATIVE Final    Comment:        The GeneXpert MRSA Assay (FDA approved for NASAL specimens only), is one component of a comprehensive MRSA colonization surveillance program. It is not intended to diagnose MRSA infection nor to guide or monitor treatment for MRSA infections.       Radiology Studies: Dg Chest 2 View  11/15/2015  CLINICAL DATA:  Near syncope.  Dyspnea EXAM: CHEST  2 VIEW COMPARISON:  05/06/2015 FINDINGS: Chronic cardiomegaly and aortic tortuosity. Chronic prominence of basilar lung markings, likely mild scar. There is no edema, consolidation, effusion, or pneumothorax. IMPRESSION: Stable.  No evidence of acute disease. Chronic cardiomegaly. Electronically Signed   By: Marnee Spring M.D.   On: 11/15/2015 21:56   Ct Head Wo  Contrast  11/16/2015  CLINICAL DATA:  Dizziness and difficulty walking. History of hypertension and diabetes. EXAM: CT HEAD WITHOUT CONTRAST TECHNIQUE: Contiguous axial images were obtained from the base of the skull through the vertex without intravenous contrast. COMPARISON:  CT HEAD May 06, 2015 FINDINGS: INTRACRANIAL CONTENTS: The ventricles and sulci are normal for age. No intraparenchymal hemorrhage, mass effect nor midline shift. Patchy supratentorial white matter hypodensities are within normal range for patient's age and though non-specific likely represent chronic small vessel ischemic disease. No acute large vascular territory infarcts. No abnormal extra-axial fluid collections. Basal cisterns are patent. Moderate calcific atherosclerosis of the carotid siphons. ORBITS: The included ocular globes  and orbital contents are non-suspicious. Status post LEFT ocular lens implant. SINUSES: Minimal LEFT mastoid effusion. Trace paranasal sinus mucosal thickening. Bilateral hearing aids in place, if MRI is performed the should be removed. SKULL/SOFT TISSUES: No skull fracture. No significant soft tissue swelling. Small LEFT occipital scalp lipoma. IMPRESSION: No acute intracranial process; negative CT HEAD for age. Electronically Signed   By: Awilda Metro M.D.   On: 11/16/2015 02:36   Ct Chest Wo Contrast  11/16/2015  CLINICAL DATA:  Dyspnea, nausea and vomiting, generalized weakness. EXAM: CT CHEST WITHOUT CONTRAST TECHNIQUE: Multidetector CT imaging of the chest was performed following the standard protocol without IV contrast. COMPARISON:  Chest CT angiogram dated 06/06/2013. FINDINGS: Mediastinum/Lymph Nodes: Scattered atherosclerotic changes along the walls of the normal-caliber thoracic aorta. Cardiomegaly is stable. No pericardial effusion. Coronary artery calcifications noted. No mass or enlarged lymph nodes seen within the mediastinum or perihilar regions. Lungs/Pleura: Small patchy  consolidations at each lung base, most likely atelectasis. No pleural effusion or pneumothorax. Trachea and central bronchi are unremarkable. Upper abdomen: Limited images of the upper abdomen are unremarkable. Status post cholecystectomy. Fatty infiltration of the pancreas. Musculoskeletal: Degenerative changes throughout the thoracic spine, at least moderate in degree. Additional degenerative changes at the left shoulder. Anterior cervical fusion hardware partially imaged. No acute or suspicious osseous lesion. IMPRESSION: 1. Patchy small consolidations at each lung base, most likely atelectasis. Pneumonia is felt to be much less likely. Lungs otherwise clear. No evidence of pneumonia. No pleural effusion. 2. Cardiomegaly.  No evidence of CHF.  No pericardial effusion. 3. Coronary artery calcifications, particularly dense within the left main coronary artery. Recommend correlation with any possible associated cardiac symptoms. Electronically Signed   By: Bary Richard M.D.   On: 11/16/2015 10:53   Ct Abdomen Pelvis W Contrast  11/16/2015  CLINICAL DATA:  Diffuse abdominal pain EXAM: CT ABDOMEN AND PELVIS WITH CONTRAST TECHNIQUE: Multidetector CT imaging of the abdomen and pelvis was performed using the standard protocol following bolus administration of intravenous contrast. CONTRAST:  80 cc Isovue 300 intravenous COMPARISON:  06/05/2013 FINDINGS: Lower chest and abdominal wall: Fatty enlargement of the bilateral inguinal canal. Stable simple lipoma extending ventral to the right iliopsoas. Cardiomegaly. Bilateral dependent atelectasis. Left lower lobe calcified granuloma Hepatobiliary: No focal liver abnormality.Cholecystectomy with negative common bile duct. Pancreas: Fatty atrophy without acute finding Spleen: Unremarkable. Adrenals/Urinary Tract: Negative adrenals. No hydronephrosis or stone. Symmetric mild renal cortical thinning. No acute bladder finding. Mild bladder wall thickening likely from chronic  outlet obstruction Reproductive:Symmetric prostate enlargement Stomach/Bowel: No obstruction. No appendicitis. Colonic diverticulosis. Vascular/Lymphatic: No acute vascular abnormality. No mass or adenopathy. Peritoneal: No ascites or pneumoperitoneum. Musculoskeletal: No acute finding. Advanced and diffuse disc and facet degeneration status post L4-5 discectomy. Multiple Schmorl's nodes are chronic. L2 hemangioma. IMPRESSION: 1. Stable since 2014.  No acute finding to explain abdominal pain. 2. Chronic findings are described above. Electronically Signed   By: Marnee Spring M.D.   On: 11/16/2015 03:03      Scheduled Meds: . antiseptic oral rinse  7 mL Mouth Rinse BID  . brimonidine  1 drop Both Eyes Daily  . carvedilol  12.5 mg Oral BID  . cefTRIAXone (ROCEPHIN)  IV  1 g Intravenous Q24H  . clopidogrel  75 mg Oral Daily  . doxycycline  100 mg Oral Q12H  . enoxaparin (LOVENOX) injection  40 mg Subcutaneous Daily  . fluticasone furoate-vilanterol  1 puff Inhalation Daily  . gabapentin  300 mg Oral QHS  .  insulin aspart  0-9 Units Subcutaneous TID WC  . insulin glargine  31 Units Subcutaneous QHS  . latanoprost  1 drop Both Eyes QHS  . omega-3 acid ethyl esters  1,000 mg Oral Daily  . pantoprazole  40 mg Oral Daily  . simvastatin  40 mg Oral QPM  . sodium chloride  500 mL Intravenous Once  . sodium chloride flush  3 mL Intravenous Q12H  . terazosin  5 mg Oral QPM  . tiotropium  18 mcg Inhalation Daily   Continuous Infusions: . sodium chloride 75 mL/hr at 11/17/15 0906     LOS: 1 day    Time spent: 20 minutes    Debbora Presto, MD [facility] Pager [phone]  If 7PM-7AM, please contact night-coverage [email or web address] Password [email or web address] 11/17/2015, 3:55 PM

## 2015-11-17 NOTE — Progress Notes (Signed)
After hanging Zithromax IV pt complaining of itching on right arm. No generalized hives or redness noted. No redness noted near IV site. Dr. Izola Price notified. IV stopped. Orders given. Will continue to monitor

## 2015-11-17 NOTE — Progress Notes (Signed)
Inpatient Diabetes Program Recommendations  AACE/ADA: New Consensus Statement on Inpatient Glycemic Control (2015)  Target Ranges:  Prepandial:   less than 140 mg/dL      Peak postprandial:   less than 180 mg/dL (1-2 hours)      Critically ill patients:  140 - 180 mg/dL  Results for Bobby Krueger, Bobby Krueger (MRN [id number]) as of 11/17/2015 09:55  Ref. Range 11/16/2015 17:10 11/16/2015 18:32 11/16/2015 19:53 11/16/2015 22:24 11/17/2015 08:52  Glucose-Capillary Latest Ref Range: 65-99 mg/dL 563 (H) 875 (H) 643 (H) 255 (H) 187 (H)   Review of Glycemic Control  Diabetes history: DM2 Outpatient Diabetes medications: Lantus 62 units QHS Current orders for Inpatient glycemic control: Lantus 31 units QHS, Novolog 0-9 units TID with meals  Inpatient Diabetes Program Recommendations: Insulin - Basal: Please consider increasing Lantus to 33 units QHS. Correction (SSI): Please consider ordering Novolog BEDTIME correction scale. HgbA1C: Please consider ordering an A1C to evaluate glycemic control over the past 2-3 months.  Thanks, Orlando Penner, RN, MSN, CDE Diabetes Coordinator Inpatient Diabetes Program [phone] (Team Pager from 8am to 5pm) [phone] ([facility] office) [phone] [facility] office) [phone] [facility] office)

## 2015-11-18 ENCOUNTER — Inpatient Hospital Stay (HOSPITAL_COMMUNITY): Payer: Medicare Other

## 2015-11-18 DIAGNOSIS — R0600 Dyspnea, unspecified: Secondary | ICD-10-CM

## 2015-11-18 DIAGNOSIS — K219 Gastro-esophageal reflux disease without esophagitis: Secondary | ICD-10-CM

## 2015-11-18 LAB — CBC
HCT: 34.9 % — ABNORMAL LOW (ref 39.0–52.0)
HEMOGLOBIN: 11.9 g/dL — AB (ref 13.0–17.0)
MCH: 30.7 pg (ref 26.0–34.0)
MCHC: 34.1 g/dL (ref 30.0–36.0)
MCV: 90.2 fL (ref 78.0–100.0)
Platelets: 119 10*3/uL — ABNORMAL LOW (ref 150–400)
RBC: 3.87 MIL/uL — AB (ref 4.22–5.81)
RDW: 12.7 % (ref 11.5–15.5)
WBC: 7.1 10*3/uL (ref 4.0–10.5)

## 2015-11-18 LAB — BASIC METABOLIC PANEL
ANION GAP: 7 (ref 5–15)
BUN: 6 mg/dL (ref 6–20)
CHLORIDE: 106 mmol/L (ref 101–111)
CO2: 26 mmol/L (ref 22–32)
CREATININE: 1.05 mg/dL (ref 0.61–1.24)
Calcium: 8.7 mg/dL — ABNORMAL LOW (ref 8.9–10.3)
GFR calc non Af Amer: >60 mL/min (ref >60)
Glucose, Bld: 151 mg/dL — ABNORMAL HIGH (ref 65–99)
POTASSIUM: 3.6 mmol/L (ref 3.5–5.1)
SODIUM: 139 mmol/L (ref 135–145)

## 2015-11-18 LAB — GLUCOSE, CAPILLARY
GLUCOSE-CAPILLARY: 149 mg/dL — AB (ref 65–99)
GLUCOSE-CAPILLARY: 196 mg/dL — AB (ref 65–99)
Glucose-Capillary: 190 mg/dL — ABNORMAL HIGH (ref 65–99)
Glucose-Capillary: 189 mg/dL — ABNORMAL HIGH (ref 65–99)

## 2015-11-18 LAB — ECHOCARDIOGRAM COMPLETE
HEIGHTINCHES: 66 in
WEIGHTICAEL: 3379.2 [oz_av]

## 2015-11-18 MED ORDER — PERFLUTREN LIPID MICROSPHERE
1.0000 mL | INTRAVENOUS | Status: DC | PRN
  Administered 2015-11-18: 2 mL via INTRAVENOUS
  Filled 2015-11-18: qty 10

## 2015-11-18 MED ORDER — SENNOSIDES-DOCUSATE SODIUM 8.6-50 MG PO TABS
2.0000 | ORAL_TABLET | Freq: Every evening | ORAL | Status: DC | PRN
  Administered 2015-11-19: 2 via ORAL
  Filled 2015-11-18: qty 2

## 2015-11-18 NOTE — Evaluation (Signed)
Physical Therapy Evaluation Patient Details Name: Bobby Krueger MRN: [id number] DOB: October 22, 1926 Today's Date: 11/18/2015   History of Present Illness  Bobby Krueger is a 82 y.o. male with medical history significant of COPD, diabetes mellitus type 2, HTN, HLD, CHF; who presents with generalized weakness and complaints of being lightheaded. PMHx- COPD (denies use of home O2), DM, CHF, HTN    Clinical Impression  Pt admitted with above diagnosis. Patient significantly orthostatic during session (+ lightheadedness/feeling faint). BP continued to drop when returned to sitting from standing and had to recline patient to get BP to increase. (see flowsheet for vitals) Full extent of patient's mobiity deficits could not be assessed due to orthostasis. Pt currently with functional limitations due to the deficits listed below (see PT Problem List).  Pt will benefit from skilled PT to increase their independence and safety with mobility to allow discharge to the venue listed below.  Will see again 5/3 for further mobility, if BP permits.     Follow Up Recommendations Home health PT;Supervision for mobility/OOB    Equipment Recommendations   (TBA as able to ambulate)    Recommendations for Other Services OT consult     Precautions / Restrictions Precautions Precautions: Fall      Mobility  Bed Mobility                  Transfers Overall transfer level: Needs assistance Equipment used: None Transfers: Sit to/from Stand Sit to Stand: Min guard         General transfer comment: uses wide base of support and ascends slowly; appears unsteady but no overt LOB  Ambulation/Gait             General Gait Details: unable due to orthostasis  Stairs            Wheelchair Mobility    Modified Rankin (Stroke Patients Only)       Balance Overall balance assessment: Needs assistance Sitting-balance support: No upper extremity supported;Feet supported Sitting  balance-Leahy Scale: Fair     Standing balance support: No upper extremity supported Standing balance-Leahy Scale: Fair                               Pertinent Vitals/Pain See vitals flow sheet.   Pain Assessment: No/denies pain    Home Living Family/patient expects to be discharged to:: Private residence Living Arrangements: Children (daughter) Available Help at Discharge: Family Type of Home: House Home Access: Stairs to enter Entrance Stairs-Rails: Right Entrance Stairs-Number of Steps: 3 Home Layout: One level Home Equipment: Cane - single point;Walker - 2 wheels;Walker - 4 wheels;Shower seat - built in;Grab bars - tub/shower      Prior Function Level of Independence: Independent with assistive device(s)         Comments: uses cane to walk     Hand Dominance        Extremity/Trunk Assessment   Upper Extremity Assessment: Overall WFL for tasks assessed           Lower Extremity Assessment: Overall WFL for tasks assessed;RLE deficits/detail;LLE deficits/detail (strength 4-5 throughout; symmetrical)      Cervical / Trunk Assessment: Normal  Communication   Communication: HOH  Cognition Arousal/Alertness: Awake/alert Behavior During Therapy: WFL for tasks assessed/performed Overall Cognitive Status: Within Functional Limits for tasks assessed  General Comments      Exercises        Assessment/Plan    PT Assessment Patient needs continued PT services  PT Diagnosis Difficulty walking   PT Problem List Decreased activity tolerance;Decreased balance;Decreased mobility;Decreased knowledge of use of DME;Cardiopulmonary status limiting activity;Impaired sensation;Obesity  PT Treatment Interventions DME instruction;Gait training;Functional mobility training;Therapeutic activities;Balance training;Patient/family education   PT Goals (Current goals can be found in the Care Plan section) Acute Rehab PT  Goals Patient Stated Goal: return home only using a cane PT Goal Formulation: With patient Time For Goal Achievement: 11/25/15 Potential to Achieve Goals: Good    Frequency Min 3X/week   Barriers to discharge        Co-evaluation               End of Session Equipment Utilized During Treatment: Gait belt Activity Tolerance: Treatment limited secondary to medical complications (Comment) (orthostasis) Patient left: in chair;with call bell/phone within reach Nurse Communication: Mobility status;Other (comment) (orthostasis)         Time: 2979-8921 PT Time Calculation (min) (ACUTE ONLY): 23 min   Charges:   PT Evaluation $PT Eval Moderate Complexity: 1 Procedure PT Treatments $Therapeutic Activity: 8-22 mins   PT G Codes:        Iyahna Obriant 29-Nov-2015, 3:45 PM Pager [phone]

## 2015-11-18 NOTE — Progress Notes (Signed)
Patient ID: Bobby Krueger, male   DOB: [birth year], 82 y.o.   MRN: [id number]    PROGRESS NOTE    Bobby Krueger  VQX:[id number] DOB: 1926-10-25 DOA: 11/15/2015  [facility]: [facility]   Outpatient Specialists: Unknown   Brief Narrative:  82 y.o. male with COPD, diabetes mellitus type 2, HTN, HLD, CHF who presented to [facility] ED with main concern of progressively worsening fatigue and weakness, dyspnea that has been present with exertion and occasionally at rest, associated with poor oral intake, mixed episodes of non productive and productive cough of yellow sputum. He also reports history of recently finding a tick on his abdomen as well as his back that possibly had been there 1-2 days, but states that the size of the ticks were less than a centimeter.  Assessment & Plan:   Principal Problem:   Acute respiratory distress with hypoxia - requiring NRB in ED to maintain oxygen saturations > 90% - this has been contributed to AECOD and ? Bronchitis as wheezing was present on initial exam - CT chest with no clear evidence of PNA but given pt's reports of productive sputum, WBC 13 K on admission, empiric ABX coverage started, will continue doxycycline, today is day #2/5 - also obtained sputum culture, strep pneumo, still pending  - allow BD as needed  - CBC in AM  Active Problems:   Dehydration  - appears to be related to poor oral intake, pre renal etiology - IVF provided and Cr is now WNL     CAD (coronary artery disease) - no chest pain this AM, continue statin and plavix - ECHO with EF 40 - 45 %, will ask cardio for further recommendations and follow up     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  Objective: Filed Vitals:   11/18/15 0504 11/18/15 0800 11/18/15 1022 11/18/15 1349  BP: 136/63 123/48  119/60  Pulse: 82 70    Temp: 98.5 F (36.9 C) 97.9 F (36.6 C)    TempSrc: Oral Oral    Resp: 18 18    Height:      Weight: 95.8 kg (211 lb 3.2 oz)     SpO2: 96% 92% 92% 93%    Intake/Output Summary (Last 24 hours) at 11/18/15 1627 Last data filed at 11/18/15 1623  Gross per 24 hour  Intake   1080 ml  Output   1100 ml  Net    -20 ml   Filed Weights   11/16/15 1758 11/17/15 1847 11/18/15 0504  Weight: 95.2 kg (209 lb 14.1 oz) 95.754 kg (211 lb 1.6 oz) 95.8 kg (211 lb 3.2 oz)    Examination:  General exam: Appears calm and comfortable  Respiratory system: Diminished breath sounds at bases, no wheezing, dullness to percussion at bases  Cardiovascular system: S1 & S2 heard, RRR. No JVD, murmurs, rubs, gallops or clicks. No pedal edema. Gastrointestinal system: Abdomen is nondistended, soft and nontender.  Central nervous system: Alert and oriented. No focal neurological deficits. Extremities: Symmetric 5 x 5 power. Skin: No  rashes, lesions or ulcers  Data Reviewed: I have personally reviewed following labs and imaging studies  CBC:  Recent Labs Lab 11/15/15 2107 11/16/15 0535 11/18/15 0417  WBC 13.0* 8.7 7.1  HGB 14.1 13.4 11.9*  HCT 41.7 38.7* 34.9*  MCV 89.1 88.6 90.2  PLT 167 160 119*   Basic Metabolic Panel:  Recent Labs Lab 11/15/15 2107 11/16/15 0535 11/18/15 0417  NA 136 137 139  K 4.4 3.8 3.6  CL 102 100* 106  CO2 GLUCOSE 304* 241* 151*  BUN CREATININE 1.44* 1.23 1.05  CALCIUM 9.2 8.8* 8.7*  MG  --  1.7  --   PHOS  --  4.3  --    Liver Function Tests:  Recent Labs Lab 11/15/15 2107  AST 23  ALT 16*  ALKPHOS 96  BILITOT 1.2   PROT 6.9  ALBUMIN 3.6    Recent Labs Lab 11/15/15 2107  LIPASE 21   Cardiac Enzymes:  Recent Labs Lab 11/16/15 0535 11/16/15 0907 11/16/15 1645 11/16/15 2053 11/17/15 0329  TROPONINI 0.03 0.04* <0.03 0.03 <0.03   CBG:  Recent Labs Lab 11/17/15 1533 11/17/15 1818 11/17/15 2023 11/18/15 0641 11/18/15 1140  GLUCAP 194* 201* 171* 149* 196*   Urine analysis:    Component Value Date/Time   COLORURINE YELLOW 11/15/2015 2306   COLORURINE Yellow 06/05/2013 1502   APPEARANCEUR CLEAR 11/15/2015 2306   APPEARANCEUR Clear 06/05/2013 1502   LABSPEC 1.023 11/15/2015 2306   LABSPEC 1.012 06/05/2013 1502   PHURINE 5.5 11/15/2015 2306   PHURINE 7.0 06/05/2013 1502   GLUCOSEU >1000* 11/15/2015 2306   GLUCOSEU 50 mg/dL 40/98/1191 4782   HGBUR SMALL* 11/15/2015 2306   HGBUR Negative 06/05/2013 1502   BILIRUBINUR NEGATIVE 11/15/2015 2306   BILIRUBINUR Negative 06/05/2013 1502   KETONESUR NEGATIVE 11/15/2015 2306   KETONESUR Negative 06/05/2013 1502   PROTEINUR NEGATIVE 11/15/2015 2306   PROTEINUR Negative 06/05/2013 1502   NITRITE NEGATIVE 11/15/2015 2306   NITRITE Negative 06/05/2013 1502   LEUKOCYTESUR NEGATIVE 11/15/2015 2306   LEUKOCYTESUR Negative 06/05/2013 1502   Recent Results (from the past 240 hour(s))  MRSA PCR Screening     Status: None   Collection Time: 11/16/15  6:30 PM  Result Value Ref Range Status   MRSA by PCR NEGATIVE NEGATIVE Final    Comment:        The GeneXpert MRSA Assay (FDA approved for NASAL specimens only), is one component of a comprehensive MRSA colonization surveillance program. It is not intended to diagnose MRSA infection nor to guide or monitor treatment for MRSA infections.       Radiology Studies: No results found.    Scheduled Meds: . antiseptic oral rinse  7 mL Mouth Rinse BID  . brimonidine  1 drop Both Eyes Daily  . carvedilol  12.5 mg Oral BID  . clopidogrel  75 mg Oral Daily  . doxycycline  100 mg Oral Q12H   . enoxaparin (LOVENOX) injection  40 mg Subcutaneous Daily  . fluticasone furoate-vilanterol  1 puff Inhalation Daily  . gabapentin  300 mg Oral QHS  . insulin aspart  0-9 Units Subcutaneous TID WC  . insulin glargine  31 Units Subcutaneous QHS  . latanoprost  1 drop Both Eyes QHS  . omega-3 acid ethyl esters  1,000 mg Oral Daily  . pantoprazole  40 mg Oral Daily  . simvastatin  40 mg Oral QPM  . sodium chloride  500 mL Intravenous Once  .  sodium chloride flush  3 mL Intravenous Q12H  . terazosin  5 mg Oral QPM  . tiotropium  18 mcg Inhalation Daily   Continuous Infusions:     LOS: 2 days    Time spent: 20 minutes    Bobby Presto, MD [facility] Pager [phone]  If 7PM-7AM, please contact night-coverage [email or web address] Password [facility] 11/18/2015, 4:27 PM

## 2015-11-18 NOTE — Progress Notes (Signed)
Echocardiogram 2D Echocardiogram has been performed.  Bobby Krueger 11/18/2015, 10:13 AM

## 2015-11-18 NOTE — Progress Notes (Signed)
Pt states no BM since last Friday. MD paged. Order received for Senokot PRN. Pt wishes to wait until the AM before he takes this med. Will continue to monitor.

## 2015-11-19 ENCOUNTER — Encounter (HOSPITAL_COMMUNITY): Payer: Self-pay | Admitting: Physician Assistant

## 2015-11-19 DIAGNOSIS — N179 Acute kidney failure, unspecified: Secondary | ICD-10-CM

## 2015-11-19 DIAGNOSIS — I501 Left ventricular failure: Secondary | ICD-10-CM

## 2015-11-19 DIAGNOSIS — I4891 Unspecified atrial fibrillation: Secondary | ICD-10-CM

## 2015-11-19 DIAGNOSIS — I2510 Atherosclerotic heart disease of native coronary artery without angina pectoris: Secondary | ICD-10-CM

## 2015-11-19 DIAGNOSIS — I480 Paroxysmal atrial fibrillation: Secondary | ICD-10-CM

## 2015-11-19 DIAGNOSIS — I4892 Unspecified atrial flutter: Secondary | ICD-10-CM

## 2015-11-19 LAB — CBC
HEMATOCRIT: 33.4 % — AB (ref 39.0–52.0)
HEMOGLOBIN: 11.3 g/dL — AB (ref 13.0–17.0)
MCH: 30.7 pg (ref 26.0–34.0)
MCHC: 33.8 g/dL (ref 30.0–36.0)
MCV: 90.8 fL (ref 78.0–100.0)
Platelets: 116 10*3/uL — ABNORMAL LOW (ref 150–400)
RBC: 3.68 MIL/uL — AB (ref 4.22–5.81)
RDW: 12.6 % (ref 11.5–15.5)
WBC: 6.8 10*3/uL (ref 4.0–10.5)

## 2015-11-19 LAB — BASIC METABOLIC PANEL
ANION GAP: 6 (ref 5–15)
BUN: 6 mg/dL (ref 6–20)
CHLORIDE: 107 mmol/L (ref 101–111)
CO2: 27 mmol/L (ref 22–32)
Calcium: 8.6 mg/dL — ABNORMAL LOW (ref 8.9–10.3)
Creatinine, Ser: 1.03 mg/dL (ref 0.61–1.24)
GFR calc Af Amer: >60 mL/min (ref >60)
GFR calc non Af Amer: >60 mL/min (ref >60)
GLUCOSE: 151 mg/dL — AB (ref 65–99)
POTASSIUM: 3.8 mmol/L (ref 3.5–5.1)
Sodium: 140 mmol/L (ref 135–145)

## 2015-11-19 LAB — GLUCOSE, CAPILLARY
GLUCOSE-CAPILLARY: 126 mg/dL — AB (ref 65–99)
GLUCOSE-CAPILLARY: 130 mg/dL — AB (ref 65–99)
Glucose-Capillary: 135 mg/dL — ABNORMAL HIGH (ref 65–99)
Glucose-Capillary: 125 mg/dL — ABNORMAL HIGH (ref 65–99)

## 2015-11-19 LAB — LYME, WESTERN BLOT, SERUM (REFLEXED)
IGG P18 AB.: ABSENT
IGG P23 AB.: ABSENT
IGG P28 AB.: ABSENT
IGG P30 AB.: ABSENT
IGG P41 AB.: ABSENT
IGG P45 AB.: ABSENT
IGM P23 AB.: ABSENT
IGM P41 AB.: ABSENT
IgG P39 Ab.: ABSENT
IgG P58 Ab.: ABSENT
IgG P66 Ab.: ABSENT
IgG P93 Ab.: ABSENT
IgM P39 Ab.: ABSENT
LYME IGG WB: NEGATIVE
LYME IGM WB: NEGATIVE

## 2015-11-19 LAB — B. BURGDORFI ANTIBODIES: B burgdorferi Ab IgG+IgM: 1.12 {ISR} — ABNORMAL HIGH (ref 0.00–0.90)

## 2015-11-19 MED ORDER — INSULIN ASPART 100 UNIT/ML ~~LOC~~ SOLN
0.0000 [IU] | Freq: Three times a day (TID) | SUBCUTANEOUS | Status: DC
Start: 2015-11-19 — End: 2015-11-21
  Administered 2015-11-19 – 2015-11-21 (×4): 1 [IU] via SUBCUTANEOUS

## 2015-11-19 MED ORDER — BISACODYL 10 MG RE SUPP
10.0000 mg | Freq: Once | RECTAL | Status: AC
  Administered 2015-11-19: 10 mg via RECTAL
  Filled 2015-11-19: qty 1

## 2015-11-19 MED ORDER — APIXABAN 5 MG PO TABS
5.0000 mg | ORAL_TABLET | Freq: Two times a day (BID) | ORAL | Status: DC
  Administered 2015-11-19 – 2015-11-21 (×5): 5 mg via ORAL
  Filled 2015-11-19 (×5): qty 1

## 2015-11-19 MED ORDER — LOSARTAN POTASSIUM 25 MG PO TABS
25.0000 mg | ORAL_TABLET | Freq: Every day | ORAL | Status: DC
  Administered 2015-11-20 – 2015-11-21 (×2): 25 mg via ORAL
  Filled 2015-11-19 (×3): qty 1

## 2015-11-19 NOTE — Evaluation (Signed)
Occupational Therapy Evaluation Patient Details Name: Bobby Krueger MRN: [id number] DOB: Jan 24, 1927 Today's Date: 11/19/2015    History of Present Illness Bobby Krueger is a 82 y.o. male with medical history significant of COPD, diabetes mellitus type 2, HTN, HLD, CHF; who presents with generalized weakness and complaints of being lightheaded. PMHx- COPD (denies use of home O2), DM, CHF, HTN   Clinical Impression   Pt admitted with above. He demonstrates the below listed deficits and will benefit from continued OT to maximize safety and independence with BADLs.  Pt presents to OT with generalized weakness and impaired balance.  He requires min guard assist for ADLs.  Anticipate good progress.  Will follow.       Follow Up Recommendations  No OT follow up;Supervision/Assistance - 24 hour    Equipment Recommendations  None recommended by OT    Recommendations for Other Services       Precautions / Restrictions Precautions Precautions: Fall      Mobility Bed Mobility               General bed mobility comments: Pt in recliner  Transfers Overall transfer level: Needs assistance Equipment used: Rolling walker (2 wheeled) Transfers: Sit to/from [employer] Sit to Stand: Min guard Stand pivot transfers: Min guard            Balance Overall balance assessment: Needs assistance Sitting-balance support: Feet supported Sitting balance-Leahy Scale: Good     Standing balance support: During functional activity Standing balance-Leahy Scale: Fair                              ADL Overall ADL's : Needs assistance/impaired Eating/Feeding: Independent   Grooming: Wash/dry hands;Wash/dry face;Oral care;Brushing hair;Min guard;Standing   Upper Body Bathing: Set up;Sitting   Lower Body Bathing: Min guard;Sit to/from stand   Upper Body Dressing : Set up;Sitting   Lower Body Dressing: Min guard;Sit to/from stand   Toilet Transfer: Min  guard;Ambulation;Comfort height toilet;Grab bars;RW   Toileting- Architect and Hygiene: Min guard;Sit to/from stand       Functional mobility during ADLs: Engineer, technical sales     Praxis      Pertinent Vitals/Pain Pain Assessment: No/denies pain     Hand Dominance     Extremity/Trunk Assessment Upper Extremity Assessment Upper Extremity Assessment: LUE deficits/detail LUE Deficits / Details: grossly 4/5    Lower Extremity Assessment Lower Extremity Assessment: Defer to PT evaluation   Cervical / Trunk Assessment Cervical / Trunk Assessment: Normal   Communication Communication Communication: HOH   Cognition Arousal/Alertness: Awake/alert Behavior During Therapy: WFL for tasks assessed/performed Overall Cognitive Status: Within Functional Limits for tasks assessed                     General Comments       Exercises       Shoulder Instructions      Home Living Family/patient expects to be discharged to:: Private residence Living Arrangements: Children (daughter) Available Help at Discharge: Family Type of Home: House Home Access: Stairs to enter Secretary/administrator of Steps: 3 Entrance Stairs-Rails: Right Home Layout: One level     Bathroom Shower/Tub: Producer, television/film/video: Standard     Home Equipment: Cane - single point;Walker - 2 wheels;Walker - 4 wheels;Shower seat - built in;Grab bars - tub/shower  Prior Functioning/Environment Level of Independence: Independent with assistive device(s)        Comments: uses cane to walk.  Pt reports he drives     OT Diagnosis: Generalized weakness   OT Problem List: Decreased strength;Decreased activity tolerance;Impaired balance (sitting and/or standing);Decreased knowledge of use of DME or AE;Cardiopulmonary status limiting activity   OT Treatment/Interventions: Self-care/ADL training;DME and/or AE  instruction;Therapeutic activities;Visual/perceptual remediation/compensation;Patient/family education;Balance training    OT Goals(Current goals can be found in the care plan section) Acute Rehab OT Goals Patient Stated Goal: return home  OT Goal Formulation: With patient Time For Goal Achievement: 12/03/15 Potential to Achieve Goals: Good ADL Goals Pt Will Perform Grooming: with modified independence;standing Pt Will Perform Upper Body Bathing: with modified independence;sitting;standing Pt Will Perform Lower Body Bathing: with modified independence;sit to/from stand Pt Will Perform Upper Body Dressing: with modified independence;sitting Pt Will Perform Lower Body Dressing: with modified independence;sit to/from stand Pt Will Transfer to Toilet: with modified independence;ambulating;regular height toilet;grab bars Pt Will Perform Toileting - Clothing Manipulation and hygiene: with modified independence;sit to/from stand Pt Will Perform Tub/Shower Transfer: Shower transfer;ambulating;shower seat;rolling walker  OT Frequency: Min 2X/week   Barriers to D/C:            Co-evaluation              End of Session Equipment Utilized During Treatment: Engineer, water Communication: Mobility status  Activity Tolerance: Patient tolerated treatment well Patient left: in chair;with call bell/phone within reach   Time: 1419-1435 OT Time Calculation (min): 16 min Charges:  OT General Charges $OT Visit: 1 Procedure OT Evaluation $OT Eval Moderate Complexity: 1 Procedure G-Codes:    Ruhama Lehew M 2015-11-22, 3:26 PM

## 2015-11-19 NOTE — Progress Notes (Signed)
Physical Therapy Treatment Patient Details Name: Bobby Krueger MRN: [id number] DOB: June 03, 1927 Today's Date: 11/19/2015    History of Present Illness Bobby Krueger is a 82 y.o. male with medical history significant of COPD, diabetes mellitus type 2, HTN, HLD, CHF; who presents with generalized weakness and complaints of being lightheaded. PMHx- COPD (denies use of home O2), DM, CHF, HTN    PT Comments    Bobby Krueger ambulated to bathroom w/ min guard assist using RW and c/o dizziness upon standing from commode.  Pt +orthostatic, RN made aware.  Pt will benefit from continued skilled PT services to increase functional independence and safety.   Follow Up Recommendations  Home health PT;Supervision for mobility/OOB     Equipment Recommendations  None recommended by PT    Recommendations for Other Services       Precautions / Restrictions Precautions Precautions: Fall Precaution Comments: check BP Restrictions Weight Bearing Restrictions: No    Mobility  Bed Mobility               General bed mobility comments: Pt in recliner  Transfers Overall transfer level: Needs assistance Equipment used: Rolling walker (2 wheeled) Transfers: Sit to/from [employer] Transfers Sit to Stand: Min guard Stand pivot transfers: Min guard       General transfer comment: Cues for hand placement, pt slow to stand  Ambulation/Gait Ambulation/Gait assistance: Min guard Ambulation Distance (Feet): 30 Feet Assistive device: Rolling walker (2 wheeled) Gait Pattern/deviations: Step-through pattern;Decreased stride length;Trunk flexed   Gait velocity interpretation: Below normal speed for age/gender General Gait Details: Close min guard as pt reports feeling dizzy after standing from commode. Pt demonstrates slightly flexed posture.   Stairs            Wheelchair Mobility    Modified Rankin (Stroke Patients Only)       Balance Overall balance assessment: Needs  assistance Sitting-balance support: No upper extremity supported;Feet supported Sitting balance-Leahy Scale: Good     Standing balance support: Single extremity supported;During functional activity Standing balance-Leahy Scale: Poor Standing balance comment: RW used due to pt's c/o dizziness and orthostasis                    Cognition Arousal/Alertness: Awake/alert Behavior During Therapy: WFL for tasks assessed/performed Overall Cognitive Status: Within Functional Limits for tasks assessed                      Exercises      General Comments General comments (skin integrity, edema, etc.): BP taken after assisted back to chair.  BP sitting: 138/73.  BP standing: 104/65      Pertinent Vitals/Pain Pain Assessment: No/denies pain    Home Living Family/patient expects to be discharged to:: Private residence Living Arrangements: Children (daughter) Available Help at Discharge: Family Type of Home: House Home Access: Stairs to enter Entrance Stairs-Rails: Right Home Layout: One level Home Equipment: Cane - single point;Walker - 2 wheels;Walker - 4 wheels;Shower seat - built in;Grab bars - tub/shower      Prior Function Level of Independence: Independent with assistive device(s)      Comments: uses cane to walk.  Pt reports he drives    PT Goals (current goals can now be found in the care plan section) Acute Rehab PT Goals Patient Stated Goal: none stated PT Goal Formulation: With patient Time For Goal Achievement: 11/25/15 Potential to Achieve Goals: Good Progress towards PT goals: Progressing toward goals  Frequency  Min 3X/week    PT Plan Current plan remains appropriate    Co-evaluation             End of Session Equipment Utilized During Treatment: Gait belt Activity Tolerance: Treatment limited secondary to medical complications (Comment) (orthostasis) Patient left: in chair;with call bell/phone within reach;with chair alarm set;with  nursing/sitter in room     Time: 1530-1555 (pt not charged for 5 minutes spent on [facility]) PT Time Calculation (min) (ACUTE ONLY): 25 min  Charges:  $Gait Training: 8-22 mins                    G Codes:      Encarnacion Chu PT, DPT  Pager: [phone] Phone: [phone] 11/19/2015, 5:17 PM

## 2015-11-19 NOTE — Consult Note (Signed)
CARDIOLOGY CONSULT NOTE   Patient ID: Bobby Krueger MRN: [id number], DOB/AGE: [birth year]   Admit date: 11/15/2015 Date of Consult: 11/19/2015   Primary Physician: [facility] Primary Cardiologist: new (previously seen by Dr. Lady Gary)  Pt. Profile  Bobby Krueger is a pleasant 82 year old Caucasian male with past medical history of HTN, HLD, DM, COPD, old LBBB and a history of CHF presented with chest pain, presyncope, hypoxia and weakness and was treated for dehydration, and later possible bronchitis. Found to have EF 40-45%, new afib and aflutter that occurred during this hospitalization  Problem List  Past Medical History  Diagnosis Date  . COPD (chronic obstructive pulmonary disease) (HCC)   . Hypertension   . High cholesterol   . CHF (congestive heart failure) (HCC)   . Diabetes mellitus without complication [facility])     Past Surgical History  Procedure Laterality Date  . Carpal tunnel release    . Cholecystectomy    . Back surgery       Allergies  Allergies  Allergen Reactions  . Aldactone [Spironolactone] Hives  . Aspirin Hives  . Ciprofloxacin Hives  . Cyclobenzaprine Hives  . Lexapro [Escitalopram] Hives    HPI   Bobby Krueger is a pleasant 82 year old Caucasian male with past medical history of HTN, HLD, DM, COPD, old LBBB and a history of CHF. He denies any knowledge of prior cardiac issue. However based on previous note, he has a long-standing history of combined systolic and diastolic heart failure. He was previously seen by Dr. Lady Gary with [facility] on 03/20/2014 for atypical chest pain. Dr. Lady Gary ordered outpatient echocardiogram and stress test. Echocardiogram obtained at the time showed EF 35%, global hypokinesis with regional variations including anterior, lateral, septal, and the inferior hypokinesis, moderate LVH. Myoview obtained at the time showed calculated LVEF 44%, normal wall motion, even radioisotope tracer distribution throughout the entire  myocardium.   According to the patient, he rarely has chest pain, the previous episode of chest pain prior to this admission is likely when he saw Dr. Lady Gary. He has been living with his daughter. He denies any recent lower extremity edema, orthopnea or paroxysmal nocturnal dyspnea. He does have appears to have at least mild dementia, and has trouble recalling some information happened in the past. He came in this time on 11/16/2015 due to presyncope and weakness and also lightheadedness. It was felt patient was likely dehydrated, he was admitted to hospitalist group for IV hydration. Also had some productive cough. According to the patient, he always have some degree of cough at baseline with white thick sputum. He denies any recent fever or chill. CT of the head on arrival show no acute intracranial process. CT of abdomen and pelvis were no acute findings. CTA of chest showed patchy small consolidation at each lung base likely atelectasis, less likely to be pneumonia, he had diffuse aortic and coronary calcification particularly dense in the left main distribution. Echocardiogram obtained on 11/18/2015 showed EF 40-45%, hypokinesis of anteroseptal, inferior, inferoseptal myocardium, grade 1 diastolic dysfunction, PA peak pressure 45 mmHg.   He has not had any further chest pain since arrival. However telemetry shows he has been going in and out of sinus, atrial flutter, and atrial fibrillation. He is currently in atrial flutter however heart rate is controlled. Cardiology has been consulted for abnormalities seen on echocardiogram.   Inpatient Medications  . antiseptic oral rinse  7 mL Mouth Rinse BID  . brimonidine  1 drop Both Eyes Daily  .  carvedilol  12.5 mg Oral BID  . clopidogrel  75 mg Oral Daily  . doxycycline  100 mg Oral Q12H  . enoxaparin (LOVENOX) injection  40 mg Subcutaneous Daily  . fluticasone furoate-vilanterol  1 puff Inhalation Daily  . gabapentin  300 mg Oral QHS  . insulin aspart   0-9 Units Subcutaneous TID WC  . insulin glargine  31 Units Subcutaneous QHS  . latanoprost  1 drop Both Eyes QHS  . omega-3 acid ethyl esters  1,000 mg Oral Daily  . pantoprazole  40 mg Oral Daily  . simvastatin  40 mg Oral QPM  . sodium chloride  500 mL Intravenous Once  . sodium chloride flush  3 mL Intravenous Q12H  . terazosin  5 mg Oral QPM  . tiotropium  18 mcg Inhalation Daily    Family History Family History  Problem Relation Age of Onset  . Prostate cancer       Social History Social History   Social History  . Marital Status: Married    Spouse Name: N/A  . Number of Children: N/A  . Years of Education: N/A   Occupational History  . Not on file.   Social History Main Topics  . Smoking status: Former Games developer  . Smokeless tobacco: Not on file  . Alcohol Use: No  . Drug Use: No  . Sexual Activity: Not on file   Other Topics Concern  . Not on file   Social History Narrative     Review of Systems  General:  No chills, fever, night sweats or weight changes.  Cardiovascular:  No dyspnea on exertion, edema, orthopnea, palpitations, paroxysmal nocturnal dyspnea. +chest pain Dermatological: No rash, lesions/masses Respiratory: + Chronic mild productive cough with thick white sputum, dyspnea Urologic: No hematuria, dysuria Abdominal:   No nausea, vomiting, diarrhea, bright red blood per rectum, melena, or hematemesis Neurologic:  No visual changes, wkns, changes in mental status. All other systems reviewed and are otherwise negative except as noted above.  Physical Exam  Blood pressure 127/59, pulse 84, temperature 98.9 F (37.2 C), temperature source Oral, resp. rate 16, height 5\' 6"  (1.676 m), weight 212 lb 14.4 oz (96.571 kg), SpO2 98 %.  General: Pleasant, NAD Psych: Normal affect. Neuro: Alert and oriented X 3. Moves all extremities spontaneously. HEENT: Normal  Neck: Supple without bruits or JVD. Lungs:  Resp regular and unlabored. Mildly diminished  breath sound in bilateral bases, otherwise clear to auscultation Heart: Irregular. no s3, s4, or murmurs. Abdomen: Soft, non-tender, non-distended, BS + x 4.  Extremities: No clubbing, cyanosis or edema. DP/PT/Radials 2+ and equal bilaterally.  Labs   Recent Labs  11/16/15 1645 11/16/15 2053 11/17/15 0329  TROPONINI <0.03 0.03 <0.03   Lab Results  Component Value Date   WBC 6.8 11/19/2015   HGB 11.3* 11/19/2015   HCT 33.4* 11/19/2015   MCV 90.8 11/19/2015   PLT 116* 11/19/2015    Recent Labs Lab 11/15/15 2107  11/19/15 0227  NA 136  < > 140  K 4.4  < > 3.8  CL 102  < > 107  CO2 23  < > 27  BUN 14  < > 6  CREATININE 1.44*  < > 1.03  CALCIUM 9.2  < > 8.6*  PROT 6.9  --   --   BILITOT 1.2  --   --   ALKPHOS 96  --   --   ALT 16*  --   --   AST 23  --   --  GLUCOSE 304*  < > 151*  < > = values in this interval not displayed. Lab Results  Component Value Date   CHOL 89 06/06/2013   HDL 37* 06/06/2013   LDLCALC 33 06/06/2013   TRIG 95 06/06/2013   No results found for: DDIMER  Radiology/Studies  Dg Chest 2 View  11/15/2015  CLINICAL DATA:  Near syncope.  Dyspnea EXAM: CHEST  2 VIEW COMPARISON:  05/06/2015 FINDINGS: Chronic cardiomegaly and aortic tortuosity. Chronic prominence of basilar lung markings, likely mild scar. There is no edema, consolidation, effusion, or pneumothorax. IMPRESSION: Stable.  No evidence of acute disease. Chronic cardiomegaly. Electronically Signed   By: Marnee Spring M.D.   On: 11/15/2015 21:56   Ct Head Wo Contrast  11/16/2015  CLINICAL DATA:  Dizziness and difficulty walking. History of hypertension and diabetes. EXAM: CT HEAD WITHOUT CONTRAST TECHNIQUE: Contiguous axial images were obtained from the base of the skull through the vertex without intravenous contrast. COMPARISON:  CT HEAD May 06, 2015 FINDINGS: INTRACRANIAL CONTENTS: The ventricles and sulci are normal for age. No intraparenchymal hemorrhage, mass effect nor midline  shift. Patchy supratentorial white matter hypodensities are within normal range for patient's age and though non-specific likely represent chronic small vessel ischemic disease. No acute large vascular territory infarcts. No abnormal extra-axial fluid collections. Basal cisterns are patent. Moderate calcific atherosclerosis of the carotid siphons. ORBITS: The included ocular globes and orbital contents are non-suspicious. Status post LEFT ocular lens implant. SINUSES: Minimal LEFT mastoid effusion. Trace paranasal sinus mucosal thickening. Bilateral hearing aids in place, if MRI is performed the should be removed. SKULL/SOFT TISSUES: No skull fracture. No significant soft tissue swelling. Small LEFT occipital scalp lipoma. IMPRESSION: No acute intracranial process; negative CT HEAD for age. Electronically Signed   By: Awilda Metro M.D.   On: 11/16/2015 02:36   Ct Chest Wo Contrast  11/16/2015  CLINICAL DATA:  Dyspnea, nausea and vomiting, generalized weakness. EXAM: CT CHEST WITHOUT CONTRAST TECHNIQUE: Multidetector CT imaging of the chest was performed following the standard protocol without IV contrast. COMPARISON:  Chest CT angiogram dated 06/06/2013. FINDINGS: Mediastinum/Lymph Nodes: Scattered atherosclerotic changes along the walls of the normal-caliber thoracic aorta. Cardiomegaly is stable. No pericardial effusion. Coronary artery calcifications noted. No mass or enlarged lymph nodes seen within the mediastinum or perihilar regions. Lungs/Pleura: Small patchy consolidations at each lung base, most likely atelectasis. No pleural effusion or pneumothorax. Trachea and central bronchi are unremarkable. Upper abdomen: Limited images of the upper abdomen are unremarkable. Status post cholecystectomy. Fatty infiltration of the pancreas. Musculoskeletal: Degenerative changes throughout the thoracic spine, at least moderate in degree. Additional degenerative changes at the left shoulder. Anterior cervical  fusion hardware partially imaged. No acute or suspicious osseous lesion. IMPRESSION: 1. Patchy small consolidations at each lung base, most likely atelectasis. Pneumonia is felt to be much less likely. Lungs otherwise clear. No evidence of pneumonia. No pleural effusion. 2. Cardiomegaly.  No evidence of CHF.  No pericardial effusion. 3. Coronary artery calcifications, particularly dense within the left main coronary artery. Recommend correlation with any possible associated cardiac symptoms. Electronically Signed   By: Bary Richard M.D.   On: 11/16/2015 10:53   Ct Abdomen Pelvis W Contrast  11/16/2015  CLINICAL DATA:  Diffuse abdominal pain EXAM: CT ABDOMEN AND PELVIS WITH CONTRAST TECHNIQUE: Multidetector CT imaging of the abdomen and pelvis was performed using the standard protocol following bolus administration of intravenous contrast. CONTRAST:  80 cc Isovue 300 intravenous COMPARISON:  06/05/2013 FINDINGS: Lower  chest and abdominal wall: Fatty enlargement of the bilateral inguinal canal. Stable simple lipoma extending ventral to the right iliopsoas. Cardiomegaly. Bilateral dependent atelectasis. Left lower lobe calcified granuloma Hepatobiliary: No focal liver abnormality.Cholecystectomy with negative common bile duct. Pancreas: Fatty atrophy without acute finding Spleen: Unremarkable. Adrenals/Urinary Tract: Negative adrenals. No hydronephrosis or stone. Symmetric mild renal cortical thinning. No acute bladder finding. Mild bladder wall thickening likely from chronic outlet obstruction Reproductive:Symmetric prostate enlargement Stomach/Bowel: No obstruction. No appendicitis. Colonic diverticulosis. Vascular/Lymphatic: No acute vascular abnormality. No mass or adenopathy. Peritoneal: No ascites or pneumoperitoneum. Musculoskeletal: No acute finding. Advanced and diffuse disc and facet degeneration status post L4-5 discectomy. Multiple Schmorl's nodes are chronic. L2 hemangioma. IMPRESSION: 1. Stable since  2014.  No acute finding to explain abdominal pain. 2. Chronic findings are described above. Electronically Signed   By: Marnee Spring M.D.   On: 11/16/2015 03:03    ECG  Currently atrial flutter, rate controlled.  ASSESSMENT AND PLAN  1. Newly diagnosed atrial fibrillation and atrial flutter: he has both, currently in aflutter  - CHA2DS2-Vasc score 7 (HTN, HLD, DM, HF, CAD, age)  - rate controlled on coreg 12.5mg  BID, occasional severe sinus bradycardia in 40s, however he does not appear to have any symptom  - will consider  BID eliquis  2. Chronic combined systolic and diastolic HF: He does not appears to be in acute CHF at this point.  - Compared with the previous echocardiogram, his EF actually improved. He is a poor interventional candidate. Given the fact that he only had one episode of chest discomfort before he came me and has no recurrence since then. We'll hold off on pursuing any further workup.  - he likely has CAD seen on calcification of CT of chest  3. HTN 4. HLD 5. DM 6. COPD 7. old LBBB    Signed, Azalee Course, [place] 11/19/2015, 1:12 PM As above, patient seen and examined. Briefly he is an 82 year old male with past medical history of hypertension diabetes mellitus, hyperlipidemia, COPD history of congestive heart failure for evaluation of reduced LV function and atrial flutter Previously seen by Dr. Lady Gary. Echocardiogram in 2015 showed ejection fraction 35%.Nuclear study showed ejection fraction 44%and normal perfusion. Patient admitted on April 30 with complaints of dyspnea, weakness and near syncope by report. He has been hydrated with some improvement.He has had echocardiogram repeated and showed ejection fraction 40-45%. On telemetry he is noted to have intermittent atrial flutter. Cardiology asked to evaluate. 1 new-onset atrial flutter/atrial fibrillation-patient appears to be asymptomatic. Continue Coreg. Rate is controlled. He has embolic risk factors of age > 90,  congestive heart failure, diabetes mellitus, hypertension and presumed coronary disease based on calcium and coronaries on CT scan. CHADsvasc 6. We will discontinue Plavix. Begin apixaban 5 mg BID. Check TSH. 2 reduced LV function-the patient is not complaining of exertional chest pain and previous nuclear study negative. Given age and other medical problems would favor medical therapy. Continue carvedilol. Add low-dose ARB. Titrate medications as outpatient. 3 presumed coronary disease based on CT scan calcium-discontinue Plavix given the need for anticoagulation. Continue statin. Pt will need close fu with Dr Lady Gary following DC Olga Millers

## 2015-11-19 NOTE — Progress Notes (Addendum)
Patient ID: Bobby Krueger, male   DOB: [birth year], 82 y.o.   MRN: [id number]    PROGRESS NOTE    Bobby Krueger  WUJ:[id number] DOB: [birth year] DOA: 11/15/2015  PCP: [facility]   Outpatient Specialists: Unknown   Brief Narrative:  82 y.o. male with COPD, diabetes mellitus type 2, HTN, HLD, CHF who presented to [facility] ED with main concern of progressively worsening fatigue and weakness, dyspnea that has been present with exertion and occasionally at rest, associated with poor oral intake, mixed episodes of non productive and productive cough of yellow sputum. He also reports history of recently finding a tick on his abdomen as well as his back that possibly had been there 1-2 days, but states that the size of the ticks were less than a centimeter.  Assessment & Plan:   Principal Problem:   Acute respiratory distress with hypoxia/ COPD with bronchitis/ acute systolic and diastolic CHF - requiring NRB in ED to maintain oxygen saturations > 90% - this has been contributed to AECOD and ? Bronchitis as wheezing was present on initial exam - CT chest with no clear evidence of PNA but given pt's reports of productive sputum, WBC 13 K on admission, empiric ABX coverage started, will continue doxycycline, today is day #3/5, WBC Is trending down  - also obtained sputum culture, strep pneumo, still pending  - allow BD as needed  - cardiology consulted for assistance as pt still with some central chest heaviness this AM - CBC in AM  Active Problems:   Dehydration  - appears to be related to poor oral intake, pre renal etiology - IVF provided and Cr is now WNL, IVF stopped 4/30    CAD (coronary artery disease) - continue statin and plavix - ECHO with EF 40 - 45 %, cardio consult pending     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 EF 40% to 45%. Hypokinesis of the anteroseptal, inferior, and inferoseptal myocardium. Doppler parameters c/w abnormal left ventricular relaxation (grade 1 diastolic dysfunction).  Antimicrobials:  Doxycycline 5/1 -->  Subjective: Reports persistent intermittent chest heaviness, worse with exertion.   Objective: Filed Vitals:   11/18/15 2005 11/19/15 0557 11/19/15 1015 11/19/15 1017  BP: 120/70 154/67    Pulse: 81 81    Temp: 98.3 F (36.8 C) 98.2 F (36.8 C)    TempSrc: Oral Oral    Resp: 18     Height:      Weight:  96.571 kg (212 lb 14.4 oz)    SpO2: 97% 93% 97% 98%    Intake/Output Summary (Last 24 hours) at 11/19/15 1150 Last data filed at 11/19/15 0900  Gross per 24 hour  Intake    960 ml  Output    825 ml  Net    135 ml   Filed Weights   11/17/15 1847 11/18/15 0504 11/19/15 0557  Weight: 95.754 kg (211 lb 1.6 oz) 95.8 kg (211 lb 3.2 oz) 96.571 kg (212 lb 14.4 oz)    Examination:  General exam: Appears calm and comfortable  Respiratory system: Diminished breath sounds at bases, no wheezing,  dullness to percussion at bases  Cardiovascular system: S1 & S2 heard, RRR. No JVD, rubs, gallops or clicks. No pedal edema. Gastrointestinal system: Abdomen is nondistended, soft and nontender.  Central nervous system: Alert and oriented. No focal neurological deficits. Extremities: Symmetric 5 x 5 power. Skin: No rashes, lesions or ulcers  Data Reviewed: I have personally reviewed following labs and imaging studies  CBC:  Recent Labs Lab 11/15/15 2107 11/16/15 0535 11/18/15 0417 11/19/15 0227  WBC 13.0* 8.7 7.1 6.8  HGB 14.1 13.4 11.9* 11.3*  HCT 41.7 38.7* 34.9* 33.4*  MCV 89.1 88.6 90.2 90.8  PLT 167 160 119* 116*   Basic Metabolic  Panel:  Recent Labs Lab 11/15/15 2107 11/16/15 0535 11/18/15 0417 11/19/15 0227  NA 136 137 139 140  K 4.4 3.8 3.6 3.8  CL 102 100* 106 107  CO2 23 28 26 27   GLUCOSE 304* 241* 151* 151*  BUN 14 11 6 6   CREATININE 1.44* 1.23 1.05 1.03  CALCIUM 9.2 8.8* 8.7* 8.6*  MG  --  1.7  --   --   PHOS  --  4.3  --   --    Liver Function Tests:  Recent Labs Lab 11/15/15 2107  AST 23  ALT 16*  ALKPHOS 96  BILITOT 1.2  PROT 6.9  ALBUMIN 3.6    Recent Labs Lab 11/15/15 2107  LIPASE 21   Cardiac Enzymes:  Recent Labs Lab 11/16/15 0535 11/16/15 0907 11/16/15 1645 11/16/15 2053 11/17/15 0329  TROPONINI 0.03 0.04* <0.03 0.03 <0.03   CBG:  Recent Labs Lab 11/18/15 0641 11/18/15 1140 11/18/15 1701 11/18/15 2154 11/19/15 0549  GLUCAP 149* 196* 190* 189* 135*   Urine analysis:    Component Value Date/Time   COLORURINE YELLOW 11/15/2015 2306   COLORURINE Yellow 06/05/2013 1502   APPEARANCEUR CLEAR 11/15/2015 2306   APPEARANCEUR Clear 06/05/2013 1502   LABSPEC 1.023 11/15/2015 2306   LABSPEC 1.012 06/05/2013 1502   PHURINE 5.5 11/15/2015 2306   PHURINE 7.0 06/05/2013 1502   GLUCOSEU >1000* 11/15/2015 2306   GLUCOSEU 50 mg/dL 33/38/3291 9166   HGBUR SMALL* 11/15/2015 2306   HGBUR Negative 06/05/2013 1502   BILIRUBINUR NEGATIVE 11/15/2015 2306   BILIRUBINUR Negative 06/05/2013 1502   KETONESUR NEGATIVE 11/15/2015 2306   KETONESUR Negative 06/05/2013 1502   PROTEINUR NEGATIVE 11/15/2015 2306   PROTEINUR Negative 06/05/2013 1502   NITRITE NEGATIVE 11/15/2015 2306   NITRITE Negative 06/05/2013 1502   LEUKOCYTESUR NEGATIVE 11/15/2015 2306   LEUKOCYTESUR Negative 06/05/2013 1502   Recent Results (from the past 240 hour(s))  MRSA PCR Screening     Status: None   Collection Time: 11/16/15  6:30 PM  Result Value Ref Range Status   MRSA by PCR NEGATIVE NEGATIVE Final    Comment:        The GeneXpert MRSA Assay (FDA approved for NASAL specimens only), is one  component of a comprehensive MRSA colonization surveillance program. It is not intended to diagnose MRSA infection nor to guide or monitor treatment for MRSA infections.       Radiology Studies: No results found.    Scheduled Meds: . antiseptic oral rinse  7 mL Mouth Rinse BID  . bisacodyl  10 mg Rectal Once  . brimonidine  1 drop Both Eyes Daily  . carvedilol  12.5 mg Oral BID  . clopidogrel  75 mg Oral Daily  . doxycycline  100 mg Oral Q12H  . enoxaparin (LOVENOX) injection  40 mg Subcutaneous Daily  .  fluticasone furoate-vilanterol  1 puff Inhalation Daily  . gabapentin  300 mg Oral QHS  . insulin aspart  0-9 Units Subcutaneous TID WC  . insulin glargine  31 Units Subcutaneous QHS  . latanoprost  1 drop Both Eyes QHS  . omega-3 acid ethyl esters  1,000 mg Oral Daily  . pantoprazole  40 mg Oral Daily  . simvastatin  40 mg Oral QPM  . sodium chloride  500 mL Intravenous Once  . sodium chloride flush  3 mL Intravenous Q12H  . terazosin  5 mg Oral QPM  . tiotropium  18 mcg Inhalation Daily   Continuous Infusions:     LOS: 3 days    Time spent: 20 minutes    Debbora Presto, MD [facility] Pager [phone]  If 7PM-7AM, please contact night-coverage [email or web address] Password [email or web address] 11/19/2015, 11:50 AM

## 2015-11-19 NOTE — Care Management Important Message (Signed)
Important Message  Patient Details  Name: Bobby Krueger MRN: [id number] Date of Birth: [birth year]   Medicare Important Message Given:  Yes    Aesha Agrawal Abena 11/19/2015, 12:03 PM

## 2015-11-20 DIAGNOSIS — R0600 Dyspnea, unspecified: Secondary | ICD-10-CM

## 2015-11-20 DIAGNOSIS — R1084 Generalized abdominal pain: Secondary | ICD-10-CM | POA: Insufficient documentation

## 2015-11-20 LAB — CBC
HEMATOCRIT: 34.3 % — AB (ref 39.0–52.0)
HEMOGLOBIN: 11.6 g/dL — AB (ref 13.0–17.0)
MCH: 30.4 pg (ref 26.0–34.0)
MCHC: 33.8 g/dL (ref 30.0–36.0)
MCV: 89.8 fL (ref 78.0–100.0)
Platelets: 132 10*3/uL — ABNORMAL LOW (ref 150–400)
RBC: 3.82 MIL/uL — AB (ref 4.22–5.81)
RDW: 12.5 % (ref 11.5–15.5)
WBC: 5.9 10*3/uL (ref 4.0–10.5)

## 2015-11-20 LAB — BASIC METABOLIC PANEL
ANION GAP: 8 (ref 5–15)
BUN: 5 mg/dL — ABNORMAL LOW (ref 6–20)
CHLORIDE: 108 mmol/L (ref 101–111)
CO2: 26 mmol/L (ref 22–32)
Calcium: 8.9 mg/dL (ref 8.9–10.3)
Creatinine, Ser: 0.9 mg/dL (ref 0.61–1.24)
GFR calc non Af Amer: >60 mL/min (ref >60)
Glucose, Bld: 87 mg/dL (ref 65–99)
POTASSIUM: 3.6 mmol/L (ref 3.5–5.1)
Sodium: 142 mmol/L (ref 135–145)

## 2015-11-20 LAB — GLUCOSE, CAPILLARY
GLUCOSE-CAPILLARY: 80 mg/dL (ref 65–99)
GLUCOSE-CAPILLARY: 94 mg/dL (ref 65–99)
GLUCOSE-CAPILLARY: 114 mg/dL — AB (ref 65–99)
GLUCOSE-CAPILLARY: 118 mg/dL — AB (ref 65–99)

## 2015-11-20 LAB — TSH: TSH: 1.519 u[IU]/mL (ref 0.350–4.500)

## 2015-11-20 MED ORDER — GI COCKTAIL ~~LOC~~: 30.0000 mL | Freq: Three times a day (TID) | ORAL | Status: DC | PRN

## 2015-11-20 NOTE — Progress Notes (Signed)
Patient Name: Bobby Krueger Date of Encounter: 11/20/2015  Primary Cardiologist: new (previously seen by Dr. Lady Gary)   Pt. Profile  Bobby Krueger is a pleasant 82 year old Caucasian male with past medical history of HTN, HLD, DM, COPD, old LBBB and a history of CHF presented with chest pain, presyncope, hypoxia and weakness and was treated for dehydration, and later possible bronchitis. Found to have EF 40-45%, new afib and aflutter that occurred during this hospitalization  Principal Problem:   Dehydration Active Problems:   AKI (acute kidney injury) (HCC)   Leukocytosis   CAD (coronary artery disease)   COPD (chronic obstructive pulmonary disease) (HCC)   GERD (gastroesophageal reflux disease)   Dyspnea   Atrial fibrillation (HCC)    SUBJECTIVE  Did not feel good overnight. Denies any CP or SOB. Does not know how long he has been having epigastric pain.  CURRENT MEDS . antiseptic oral rinse  7 mL Mouth Rinse BID  . apixaban  5 mg Oral BID  . brimonidine  1 drop Both Eyes Daily  . carvedilol  12.5 mg Oral BID  . doxycycline  100 mg Oral Q12H  . fluticasone furoate-vilanterol  1 puff Inhalation Daily  . gabapentin  300 mg Oral QHS  . insulin aspart  0-9 Units Subcutaneous TID WC  . insulin glargine  31 Units Subcutaneous QHS  . latanoprost  1 drop Both Eyes QHS  . losartan  25 mg Oral Daily  . omega-3 acid ethyl esters  1,000 mg Oral Daily  . pantoprazole  40 mg Oral Daily  . simvastatin  40 mg Oral QPM  . sodium chloride  500 mL Intravenous Once  . sodium chloride flush  3 mL Intravenous Q12H  . terazosin  5 mg Oral QPM  . tiotropium  18 mcg Inhalation Daily    OBJECTIVE  Filed Vitals:   11/19/15 1017 11/19/15 1200 11/19/15 2115 11/20/15 0450  BP:  127/59 143/65 109/65  Pulse:  84 83 82  Temp:  98.9 F (37.2 C) 98.4 F (36.9 C) 98.3 F (36.8 C)  TempSrc:  Oral Oral Oral  Resp:  Height:      Weight:    212 lb 9.6 oz (96.435 kg)  SpO2: 98% 98% 100%  98%    Intake/Output Summary (Last 24 hours) at 11/20/15 0833 Last data filed at 11/20/15 0600  Gross per 24 hour  Intake    700 ml  Output    825 ml  Net   -125 ml   Filed Weights   11/18/15 0504 11/19/15 0557 11/20/15 0450  Weight: 211 lb 3.2 oz (95.8 kg) 212 lb 14.4 oz (96.571 kg) 212 lb 9.6 oz (96.435 kg)    PHYSICAL EXAM  General: Pleasant, NAD. Neuro: Alert and oriented X 3. Moves all extremities spontaneously. Psych: Normal affect. HEENT:  Normal  Neck: Supple without bruits or JVD. Lungs:  Resp regular and unlabored, CTA. Heart: RRR no s3, s4, or murmurs. Abdomen: Soft, non-tender, non-distended, BS + x 4.  Extremities: No clubbing, cyanosis or edema. DP/PT/Radials 2+ and equal bilaterally.  Accessory Clinical Findings  CBC  Recent Labs  11/19/15 0227 11/20/15 0334  WBC 6.8 5.9  HGB 11.3* 11.6*  HCT 33.4* 34.3*  MCV 90.8 89.8  PLT 116* 132*   Basic Metabolic Panel  Recent Labs  11/19/15 0227 11/20/15 0334  NA 140 142  K 3.8 3.6  CL 107 108  CO2 27 26  GLUCOSE 151* 87  BUN 6 5*  CREATININE 1.03 0.90  CALCIUM 8.6* 8.9   Thyroid Function Tests  Recent Labs  11/20/15 0334  TSH 1.519    TELE Aflutter with HR 80s    ECG  No new EKG  Echocardiogram 11/18/2015  LV EF: 40% - 45%  ------------------------------------------------------------------- Indications: Dyspnea 786.09.  ------------------------------------------------------------------- History: PMH: Dyspnea. Congestive heart failure. Chronic obstructive pulmonary disease. Risk factors: Hypertension. Diabetes mellitus.  ------------------------------------------------------------------- Study Conclusions  - Left ventricle: The cavity size was normal. Systolic function was  mildly to moderately reduced. The estimated ejection fraction was  in the range of 40% to 45%. Hypokinesis of the anteroseptal,  inferior, and inferoseptal myocardium. Doppler  parameters are  consistent with abnormal left ventricular relaxation (grade 1  diastolic dysfunction). - Aortic valve: Transvalvular velocity was within the normal range.  There was no stenosis. There was no regurgitation. - Mitral valve: Transvalvular velocity was within the normal range.  There was no evidence for stenosis. There was trivial  regurgitation. - Left atrium: The atrium was severely dilated. - Right ventricle: The cavity size was normal. Wall thickness was  normal. Systolic function was normal. - Atrial septum: No defect or patent foramen ovale was identified  by color flow Doppler. - Tricuspid valve: There was trivial regurgitation. - Pulmonary arteries: Systolic pressure was mildly increased. PA  peak pressure: 45 mm Hg (S). - Inferior vena cava: The vessel was normal in size. The  respirophasic diameter changes were in the normal range (>= 50%),  consistent with normal central venous pressure.    Radiology/Studies  Dg Chest 2 View  11/15/2015  CLINICAL DATA:  Near syncope.  Dyspnea EXAM: CHEST  2 VIEW COMPARISON:  05/06/2015 FINDINGS: Chronic cardiomegaly and aortic tortuosity. Chronic prominence of basilar lung markings, likely mild scar. There is no edema, consolidation, effusion, or pneumothorax. IMPRESSION: Stable.  No evidence of acute disease. Chronic cardiomegaly. Electronically Signed   By: Marnee Spring M.D.   On: 11/15/2015 21:56   Ct Head Wo Contrast  11/16/2015  CLINICAL DATA:  Dizziness and difficulty walking. History of hypertension and diabetes. EXAM: CT HEAD WITHOUT CONTRAST TECHNIQUE: Contiguous axial images were obtained from the base of the skull through the vertex without intravenous contrast. COMPARISON:  CT HEAD May 06, 2015 FINDINGS: INTRACRANIAL CONTENTS: The ventricles and sulci are normal for age. No intraparenchymal hemorrhage, mass effect nor midline shift. Patchy supratentorial white matter hypodensities are within normal range  for patient's age and though non-specific likely represent chronic small vessel ischemic disease. No acute large vascular territory infarcts. No abnormal extra-axial fluid collections. Basal cisterns are patent. Moderate calcific atherosclerosis of the carotid siphons. ORBITS: The included ocular globes and orbital contents are non-suspicious. Status post LEFT ocular lens implant. SINUSES: Minimal LEFT mastoid effusion. Trace paranasal sinus mucosal thickening. Bilateral hearing aids in place, if MRI is performed the should be removed. SKULL/SOFT TISSUES: No skull fracture. No significant soft tissue swelling. Small LEFT occipital scalp lipoma. IMPRESSION: No acute intracranial process; negative CT HEAD for age. Electronically Signed   By: Awilda Metro M.D.   On: 11/16/2015 02:36   Ct Chest Wo Contrast  11/16/2015  CLINICAL DATA:  Dyspnea, nausea and vomiting, generalized weakness. EXAM: CT CHEST WITHOUT CONTRAST TECHNIQUE: Multidetector CT imaging of the chest was performed following the standard protocol without IV contrast. COMPARISON:  Chest CT angiogram dated 06/06/2013. FINDINGS: Mediastinum/Lymph Nodes: Scattered atherosclerotic changes along the walls of the normal-caliber thoracic aorta. Cardiomegaly is stable. No pericardial effusion. Coronary artery  calcifications noted. No mass or enlarged lymph nodes seen within the mediastinum or perihilar regions. Lungs/Pleura: Small patchy consolidations at each lung base, most likely atelectasis. No pleural effusion or pneumothorax. Trachea and central bronchi are unremarkable. Upper abdomen: Limited images of the upper abdomen are unremarkable. Status post cholecystectomy. Fatty infiltration of the pancreas. Musculoskeletal: Degenerative changes throughout the thoracic spine, at least moderate in degree. Additional degenerative changes at the left shoulder. Anterior cervical fusion hardware partially imaged. No acute or suspicious osseous lesion.  IMPRESSION: 1. Patchy small consolidations at each lung base, most likely atelectasis. Pneumonia is felt to be much less likely. Lungs otherwise clear. No evidence of pneumonia. No pleural effusion. 2. Cardiomegaly.  No evidence of CHF.  No pericardial effusion. 3. Coronary artery calcifications, particularly dense within the left main coronary artery. Recommend correlation with any possible associated cardiac symptoms. Electronically Signed   By: Bary Richard M.D.   On: 11/16/2015 10:53   Ct Abdomen Pelvis W Contrast  11/16/2015  CLINICAL DATA:  Diffuse abdominal pain EXAM: CT ABDOMEN AND PELVIS WITH CONTRAST TECHNIQUE: Multidetector CT imaging of the abdomen and pelvis was performed using the standard protocol following bolus administration of intravenous contrast. CONTRAST:  80 cc Isovue 300 intravenous COMPARISON:  06/05/2013 FINDINGS: Lower chest and abdominal wall: Fatty enlargement of the bilateral inguinal canal. Stable simple lipoma extending ventral to the right iliopsoas. Cardiomegaly. Bilateral dependent atelectasis. Left lower lobe calcified granuloma Hepatobiliary: No focal liver abnormality.Cholecystectomy with negative common bile duct. Pancreas: Fatty atrophy without acute finding Spleen: Unremarkable. Adrenals/Urinary Tract: Negative adrenals. No hydronephrosis or stone. Symmetric mild renal cortical thinning. No acute bladder finding. Mild bladder wall thickening likely from chronic outlet obstruction Reproductive:Symmetric prostate enlargement Stomach/Bowel: No obstruction. No appendicitis. Colonic diverticulosis. Vascular/Lymphatic: No acute vascular abnormality. No mass or adenopathy. Peritoneal: No ascites or pneumoperitoneum. Musculoskeletal: No acute finding. Advanced and diffuse disc and facet degeneration status post L4-5 discectomy. Multiple Schmorl's nodes are chronic. L2 hemangioma. IMPRESSION: 1. Stable since 2014.  No acute finding to explain abdominal pain. 2. Chronic findings  are described above. Electronically Signed   By: Marnee Spring M.D.   On: 11/16/2015 03:03    ASSESSMENT AND PLAN  1. Newly diagnosed atrial fibrillation and atrial flutter: he has both, currently in aflutter - CHA2DS2-Vasc score 6 (HTN, DM, HF, CAD, age) - rate controlled on coreg 12.5mg  BID - plavix stopped, switched to eliquis 5mg  BID.   2. Chronic combined systolic and diastolic HF: He does not appears to be in acute CHF at this point.  - Echo in 2015 EF 35%. Now EF 40-45% - Compared with the previous echocardiogram, his EF actually improved. He is a poor interventional candidate. Given the fact that he only had one episode of chest discomfort before he came and has no recurrence since then. We'll hold off on pursuing any further workup. - he likely has CAD based on calcification seen on CT of chest  - continue coreg, losartan added yesterday. BP stable.   3. Abdominal discomfort: he has epigastric pain, he says this is ongoing for a long time, he has never noticed any bleeding, denies prior knowledge of gastritis and peptic ulcer  - per IM  4. HTN 5. HLD 6. DM 7. COPD 8. old LBBB   Signed, Azalee Course PA-C Pager: [phone] As above, patient seen and examined. He denies dyspnea or chest pain. Some epigastric pain which he states is chronic. He remains in atrial flutter and previous telemetry also showed  atrial fibrillation. Continue carvedilol for rate control.Continue apixaban. TSH normal. Plan medical therapy for mild to moderate reduction in LV function. Continue beta blocker and ARB. He likely has coronary disease based on CT scan demonstrating coronary artery calcium. However we would like to be conservative given his age and multiple medical problems. Continue statin. I will not add aspirin given need for anticoagulation. Follow-up with Dr. Lady Gary in [place] following DC. Olga Millers

## 2015-11-20 NOTE — Progress Notes (Signed)
Pt a/o, no c/o pain, VSS, pt stable 

## 2015-11-20 NOTE — Discharge Instructions (Addendum)

## 2015-11-20 NOTE — Progress Notes (Signed)
Patient ID: Bobby Krueger, male   DOB: 02-Mar-1927, 82 y.o.   MRN: [id number]    PROGRESS NOTE    JEHU MCCAUSLIN  WGN:[id number] DOB: [birth year] DOA: 11/15/2015  PCP: [facility]   Outpatient Specialists: Unknown   Brief Narrative:  82 y.o. male with COPD, diabetes mellitus type 2, HTN, HLD, CHF who presented to [facility] ED with main concern of progressively worsening fatigue and weakness, dyspnea that has been present with exertion and occasionally at rest, associated with poor oral intake, mixed episodes of non productive and productive cough of yellow sputum. He also reports history of recently finding a tick on his abdomen as well as his back that possibly had been there 1-2 days, but states that the size of the ticks were less than a centimeter.  Assessment & Plan:   Principal Problem:   Acute respiratory distress with hypoxia/ COPD with bronchitis/ acute on chronic systolic and diastolic CHF - requiring NRB in ED to maintain oxygen saturations > 90% - currently no wheezing and no signs of volume overload on exam  - CT chest with no clear evidence of PNA but given pt's reports of productive sputum, WBC 13 K on admission, empiric ABX coverage started, will continue doxycycline, today is day #4/5, WBC Is now WNL     Newly diagnosed atrial fibrillation and atrial flutter - CHA2DS2-Vasc score 6 (HTN, DM, HF, CAD, age) - rate controlled on coreg 12.5mg  BID - per cardiology, plavix stopped and pt switched to Eliquis     Chronic combined systolic and diastolic HF - not in CHF any longer - Echo in 2015 EF 35%. 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 EF 40% to 45%. Hypokinesis of the anteroseptal, inferior, and inferoseptal myocardium. Doppler parameters c/w abnormal left ventricular relaxation (grade 1 diastolic dysfunction).  Antimicrobials:  Doxycycline 5/1 --> 5/5  Subjective: Reports epigastric pain this AM.   Objective: Filed Vitals:   11/19/15 2115 11/20/15 0450 11/20/15 0837 11/20/15 1211  BP: 143/65 109/65  129/71  Pulse: 83 82  88  Temp: 98.4 F (36.9 C) 98.3 F (36.8 C)  97.9 F (36.6 C)  TempSrc: Oral Oral  Oral  Resp: Height:      Weight:  96.435 kg (212 lb 9.6 oz)    SpO2: 100% 98% 88% 97%    Intake/Output Summary (Last 24 hours) at 11/20/15 1555 Last data filed at 11/20/15 1325  Gross per 24 hour  Intake    875 ml  Output    975 ml  Net   -100 ml   Filed Weights   11/18/15  1610 11/19/15 0557 11/20/15 0450  Weight: 95.8 kg (211 lb 3.2 oz) 96.571 kg (212 lb 14.4 oz) 96.435 kg (212 lb 9.6 oz)    Examination:  General exam: Appears calm and comfortable  Respiratory system: Diminished breath sounds at bases, no wheezing, dullness to percussion at bases  Cardiovascular system: S1 & S2 heard, No JVD, rubs, gallops or clicks. No pedal edema. Gastrointestinal system: Abdomen is nondistended, soft and nontender.  Central nervous system: Alert and oriented. No focal neurological deficits  Skin: No rashes, lesions or ulcers  Data Reviewed: I have personally reviewed following labs and imaging studies  CBC:  Recent Labs Lab 11/15/15 2107 11/16/15 0535 11/18/15 0417  11/19/15 0227 11/20/15 0334  WBC 13.0* 8.7 7.1 6.8 5.9  HGB 14.1 13.4 11.9* 11.3* 11.6*  HCT 41.7 38.7* 34.9* 33.4* 34.3*  MCV 89.1 88.6 90.2 90.8 89.8  PLT 167 160 119* 116* 132*   Basic Metabolic Panel:  Recent Labs Lab 11/15/15 2107 11/16/15 0535 11/18/15 0417 11/19/15 0227 11/20/15 0334  NA 136 137 139 140 142  K 4.4 3.8 3.6 3.8 3.6  CL 102 100* 106 107 108  CO2 23 28 26 27 26   GLUCOSE 304* 241* 151* 151* 87  BUN 14 11 6 6  5*  CREATININE 1.44* 1.23 1.05 1.03 0.90  CALCIUM 9.2 8.8* 8.7* 8.6* 8.9  MG  --  1.7  --   --   --   PHOS  --  4.3  --   --   --    Liver Function Tests:  Recent Labs Lab 11/15/15 2107  AST 23  ALT 16*  ALKPHOS 96  BILITOT 1.2  PROT 6.9  ALBUMIN 3.6    Recent Labs Lab 11/15/15 2107  LIPASE 21   Cardiac Enzymes:  Recent Labs Lab 11/16/15 0535 11/16/15 0907 11/16/15 1645 11/16/15 2053 11/17/15 0329  TROPONINI 0.03 0.04* <0.03 0.03 <0.03   CBG:  Recent Labs Lab 11/19/15 1158 11/19/15 1641 11/19/15 2113 11/20/15 0612 11/20/15 1150  GLUCAP 126* 125* 130* 80 94   Urine analysis:    Component Value Date/Time   COLORURINE YELLOW 11/15/2015 2306   COLORURINE Yellow 06/05/2013 1502   APPEARANCEUR CLEAR 11/15/2015 2306   APPEARANCEUR Clear 06/05/2013 1502   LABSPEC 1.023 11/15/2015 2306   LABSPEC 1.012 06/05/2013 1502   PHURINE 5.5 11/15/2015 2306   PHURINE 7.0 06/05/2013 1502   GLUCOSEU >1000* 11/15/2015 2306   GLUCOSEU 50 mg/dL 96/10/5407 8119   HGBUR SMALL* 11/15/2015 2306   HGBUR Negative 06/05/2013 1502   BILIRUBINUR NEGATIVE 11/15/2015 2306   BILIRUBINUR Negative 06/05/2013 1502   KETONESUR NEGATIVE 11/15/2015 2306   KETONESUR Negative 06/05/2013 1502   PROTEINUR NEGATIVE 11/15/2015 2306   PROTEINUR Negative 06/05/2013 1502   NITRITE NEGATIVE 11/15/2015 2306   NITRITE Negative 06/05/2013 1502   LEUKOCYTESUR NEGATIVE 11/15/2015 2306   LEUKOCYTESUR Negative 06/05/2013 1502   Recent Results (from the past  240 hour(s))  MRSA PCR Screening     Status: None   Collection Time: 11/16/15  6:30 PM  Result Value Ref Range Status   MRSA by PCR NEGATIVE NEGATIVE Final    Comment:        The GeneXpert MRSA Assay (FDA approved for NASAL specimens only), is one component of a comprehensive MRSA colonization surveillance program. It is not intended to diagnose MRSA infection nor to guide or monitor treatment for MRSA infections.       Radiology Studies: No results found.    Scheduled Meds: .  antiseptic oral rinse  7 mL Mouth Rinse BID  . apixaban  5 mg Oral BID  . brimonidine  1 drop Both Eyes Daily  . carvedilol  12.5 mg Oral BID  . doxycycline  100 mg Oral Q12H  . fluticasone furoate-vilanterol  1 puff Inhalation Daily  . gabapentin  300 mg Oral QHS  . insulin aspart  0-9 Units Subcutaneous TID WC  . insulin glargine  31 Units Subcutaneous QHS  . latanoprost  1 drop Both Eyes QHS  . losartan  25 mg Oral Daily  . omega-3 acid ethyl esters  1,000 mg Oral Daily  . pantoprazole  40 mg Oral Daily  . simvastatin  40 mg Oral QPM  . sodium chloride  500 mL Intravenous Once  . sodium chloride flush  3 mL Intravenous Q12H  . terazosin  5 mg Oral QPM  . tiotropium  18 mcg Inhalation Daily   Continuous Infusions:     LOS: 4 days    Time spent: 20 minutes    Debbora Presto, MD [facility] Pager [phone]  If 7PM-7AM, please contact night-coverage [email or web address] Password [email or web address] 11/20/2015, 3:55 PM

## 2015-11-21 LAB — BASIC METABOLIC PANEL
Anion gap: 7 (ref 5–15)
BUN: 6 mg/dL (ref 6–20)
CHLORIDE: 105 mmol/L (ref 101–111)
CO2: 30 mmol/L (ref 22–32)
CREATININE: 0.91 mg/dL (ref 0.61–1.24)
Calcium: 9.1 mg/dL (ref 8.9–10.3)
GFR calc Af Amer: >60 mL/min (ref >60)
GFR calc non Af Amer: >60 mL/min (ref >60)
GLUCOSE: 68 mg/dL (ref 65–99)
POTASSIUM: 3.6 mmol/L (ref 3.5–5.1)
SODIUM: 142 mmol/L (ref 135–145)

## 2015-11-21 LAB — CBC
HCT: 34.4 % — ABNORMAL LOW (ref 39.0–52.0)
Hemoglobin: 11.7 g/dL — ABNORMAL LOW (ref 13.0–17.0)
MCH: 30.5 pg (ref 26.0–34.0)
MCHC: 34 g/dL (ref 30.0–36.0)
MCV: 89.8 fL (ref 78.0–100.0)
Platelets: 151 10*3/uL (ref 150–400)
RBC: 3.83 MIL/uL — AB (ref 4.22–5.81)
RDW: 12.5 % (ref 11.5–15.5)
WBC: 4.8 10*3/uL (ref 4.0–10.5)

## 2015-11-21 LAB — GLUCOSE, CAPILLARY
GLUCOSE-CAPILLARY: 141 mg/dL — AB (ref 65–99)
Glucose-Capillary: 52 mg/dL — ABNORMAL LOW (ref 65–99)
Glucose-Capillary: 61 mg/dL — ABNORMAL LOW (ref 65–99)
Glucose-Capillary: 70 mg/dL (ref 65–99)

## 2015-11-21 MED ORDER — INSULIN GLARGINE 100 UNIT/ML ~~LOC~~ SOLN: 31.0000 [IU] | Freq: Every day | SUBCUTANEOUS | Status: DC

## 2015-11-21 MED ORDER — LOSARTAN POTASSIUM 25 MG PO TABS: 25.0000 mg | ORAL_TABLET | Freq: Every day | ORAL | Status: DC

## 2015-11-21 MED ORDER — APIXABAN 5 MG PO TABS: 5.0000 mg | ORAL_TABLET | Freq: Two times a day (BID) | ORAL | Status: AC

## 2015-11-21 MED ORDER — DOXYCYCLINE HYCLATE 100 MG PO TABS: 100.0000 mg | ORAL_TABLET | Freq: Two times a day (BID) | ORAL | Status: AC

## 2015-11-21 NOTE — Care Management Note (Signed)
Case Management Note  Patient Details  Name: Bobby Krueger MRN: [id number] Date of Birth: Nov 03, 1926  Subjective/Objective:     Admitted with Dehydration              Action/Plan: CM talked to patient, he lives at home with daughter, goes to the [facility] for medical care; has private insurance with Medicare / Tricare with prescription drug coverage. Pharmacy of choice is [facility]; no problem getting medication. HHC choice offered, patient chose [facility] for HHPT; also oxygen ordered as requested. Patient has a walker and cane at home that he does not use.  Expected Discharge Date:   possibly 11/21/2015               Expected Discharge Plan:  Home w Home Health Services  Discharge planning Services  CM Consult  Choice offered to:  Patient  DME Arranged:  Oxygen DME Agency:  [facility]  HH Arranged:  PT [facility] Agency:  [facility]  Status of Service:  In process, will continue to follow  Medicare Important Message Given:  Yes  Reola Mosher [phone] 11/21/2015, 1:44 PM

## 2015-11-21 NOTE — Progress Notes (Signed)
Physical Therapy Treatment Patient Details Name: Bobby Krueger MRN: [id number] DOB: 1926/12/19 Today's Date: 11/21/2015    History of Present Illness Bobby Krueger is a 82 y.o. male with medical history significant of COPD, diabetes mellitus type 2, HTN, HLD, CHF; who presents with generalized weakness and complaints of being lightheaded. PMHx- COPD (denies use of home O2), DM, CHF, HTN    PT Comments    Patient continues to be limited by dizziness. (See orthostatic BPs in flowsheet) SBP dropped 155 to 138 while walking. Patient is steady with RW yet required cues to sit down when he began to feel more dizzy. SaO2 also monitored (see separate Progress Note for home O2 qualification).   Follow Up Recommendations  Home health PT;Supervision for mobility/OOB     Equipment Recommendations  None recommended by PT    Recommendations for Other Services       Precautions / Restrictions Precautions Precautions: Fall Precaution Comments: check BP Restrictions Weight Bearing Restrictions: No    Mobility  Bed Mobility Overal bed mobility: Modified Independent                Transfers Overall transfer level: Needs assistance Equipment used: Rolling walker (2 wheeled) Transfers: Sit to/from Stand Sit to Stand: Min guard         General transfer comment: Cues for hand placement x 3 transfers, pt slow to stand  Ambulation/Gait Ambulation/Gait assistance: Min guard Ambulation Distance (Feet): 32 Feet Assistive device: Rolling walker (2 wheeled) Gait Pattern/deviations: Step-through pattern;Decreased stride length;Trunk flexed Gait velocity: slow   General Gait Details: Close min guard as pt reports feeling dizzy (at one point stated worse than when he first stood, then stated it was about the same) Pt demonstrates slightly flexed posture.   Stairs            Wheelchair Mobility    Modified Rankin (Stroke Patients Only)       Balance     Sitting  balance-Leahy Scale: Good     Standing balance support: No upper extremity supported Standing balance-Leahy Scale: Fair Standing balance comment: static standing                    Cognition Arousal/Alertness: Awake/alert Behavior During Therapy: WFL for tasks assessed/performed Overall Cognitive Status: Within Functional Limits for tasks assessed                      Exercises      General Comments        Pertinent Vitals/Pain Pain Assessment: No/denies pain    Home Living                      Prior Function            PT Goals (current goals can now be found in the care plan section) Acute Rehab PT Goals Patient Stated Goal: none stated Time For Goal Achievement: 11/25/15 Progress towards PT goals: Progressing toward goals (very slowly due to BP issues)    Frequency  Min 3X/week    PT Plan Current plan remains appropriate    Co-evaluation             End of Session Equipment Utilized During Treatment: Gait belt;Oxygen Activity Tolerance: Treatment limited secondary to medical complications (Comment) (dizzy) Patient left: in chair;with call bell/phone within reach;with chair alarm set     Time: [phone] PT Time Calculation (min) (ACUTE ONLY): 39 min  Charges:  $  Gait Training: 8-22 mins $Therapeutic Activity: 8-22 mins                    G Codes:      Sirenia Whitis 2015-12-04, 9:41 AM Pager [phone]

## 2015-11-21 NOTE — Progress Notes (Signed)
Hypoglycemic Event  CBG: 61  Treatment: 1 cup orange juice  Symptoms: none  Follow-up CBG: Time:70 CBG Result:0732  Possible Reasons for Event: pt got lantus 31 units last night  Comments/MD notified:protocol followed    Venita Sheffield

## 2015-11-21 NOTE — Progress Notes (Signed)
SATURATION QUALIFICATIONS: (This note is used to comply with regulatory documentation for home oxygen)  Patient Saturations on Room Air at Rest = 85%  Patient Saturations on Room Air while Ambulating = NA as pt drops below 88% when seated  Patient Saturations on 2 Liters of oxygen while Ambulating = 91%  Please briefly explain why patient needs home oxygen: To maintain SaO2 >88% with activity (and at rest)   11/21/2015 Veda Canning, PT Pager: [phone]

## 2015-11-21 NOTE — Discharge Summary (Signed)
Physician Discharge Summary  Patient ID: Bobby Krueger MRN: [id number] DOB/AGE: 1927-05-08 82 y.o.  Admit date: 11/15/2015 Discharge date: 11/21/2015  Admission Diagnoses: Afib/Flutter Discharge Diagnoses:  Principal Problem:   Dehydration Active Problems:   AKI (acute kidney injury) (HCC)   Leukocytosis   CAD (coronary artery disease)   COPD (chronic obstructive pulmonary disease) (HCC)   GERD (gastroesophageal reflux disease)   Dyspnea   Atrial fibrillation (HCC)   Generalized abdominal pain   Discharged Condition: stable  Hospital Course:   Newly diagnosed atrial fibrillation and atrial flutter: he has both, currently in aflutter - CHA2DS2-Vasc score 6 (HTN, DM, HF, CAD, age) - rate controlled on coreg 12.5mg  BID - plavix stopped, switched to eliquis  BID. HR stable overnight, mainly in 60-70s, occasional dips down to mid 40s-50s while in sinus rhythm but appears to be transient. Also has occasional tachycardic episodes as well - no further recommendation by cardiology service after med adjustments and img, d/capproved  Chronic combined systolic and diastolic HF: He does not appears to be in acute CHF at this point. - Echo in 2015 EF 35%. Now EF 40-45% - Compared with the previous echocardiogram, his EF actually improved. He is a poor interventional candidate. Given the fact that he only had one episode of chest discomfort before he came and has no recurrence since then. We'll hold off on pursuing any further workup. - he likely has CAD based on calcification seen on CT of chest - continue coreg, losartan added. BP stable.   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 Systolic function was  mildly to moderately reduced. The estimated ejection fraction was  in the range of 40% to 45%. Hypokinesis of the anteroseptal,  inferior, and inferoseptal myocardium. Doppler parameters are  consistent with abnormal left ventricular relaxation (grade 1  diastolic dysfunction). - Aortic valve: Transvalvular velocity was within the normal range.  There was no stenosis. There was no regurgitation. - Mitral valve: Transvalvular velocity was within the normal range.  There was no evidence for stenosis. There was trivial  regurgitation. - Left atrium: The atrium was severely dilated. - Right ventricle: The cavity size was normal. Wall thickness was  normal. Systolic function was normal. - Atrial septum: No defect or patent foramen ovale was identified  by color flow Doppler. - Tricuspid valve: There was trivial regurgitation. - Pulmonary arteries: Systolic pressure was mildly increased. PA  peak pressure: 45 mm Hg (S). - Inferior vena cava: The vessel was normal in size. The  respirophasic diameter changes were in the normal range (>= 50%),  consistent with normal central venous pressure.  Dg Chest 2 View  11/15/2015  CLINICAL DATA:  Near syncope.  Dyspnea EXAM: CHEST  2 VIEW COMPARISON:  05/06/2015 FINDINGS: Chronic cardiomegaly and aortic tortuosity. Chronic prominence of basilar lung markings, likely mild scar. There is no edema, consolidation, effusion, or pneumothorax. IMPRESSION: Stable.  No evidence of acute disease. Chronic cardiomegaly. Electronically Signed   By: Marnee Spring M.D.   On: 11/15/2015 21:56   Ct Head Wo Contrast  11/16/2015  CLINICAL DATA:  Dizziness and difficulty walking. History of hypertension and diabetes. EXAM: CT HEAD WITHOUT CONTRAST  TECHNIQUE: Contiguous axial images were obtained from the base of the skull through the vertex without intravenous contrast. COMPARISON:  CT HEAD May 06, 2015 FINDINGS: INTRACRANIAL CONTENTS: The ventricles and sulci are normal for age. No intraparenchymal hemorrhage, mass effect nor midline shift. Patchy supratentorial white matter hypodensities are within normal range for patient's age and though non-specific likely represent chronic small vessel ischemic disease. No acute large vascular territory infarcts. No abnormal extra-axial fluid collections. Basal cisterns are patent. Moderate calcific atherosclerosis of the carotid siphons. ORBITS: The included ocular globes and orbital contents are non-suspicious. Status post LEFT ocular lens implant. SINUSES: Minimal LEFT mastoid effusion. Trace paranasal sinus mucosal thickening. Bilateral hearing aids in place, if MRI is performed the should be removed. SKULL/SOFT TISSUES: No skull fracture. No significant soft tissue swelling. Small LEFT occipital scalp lipoma. IMPRESSION: No acute intracranial process; negative CT HEAD for age. Electronically Signed   By: Awilda Metro M.D.   On: 11/16/2015 02:36   Ct Chest Wo Contrast  11/16/2015  CLINICAL DATA:  Dyspnea, nausea and vomiting, generalized weakness. EXAM: CT CHEST WITHOUT CONTRAST TECHNIQUE: Multidetector CT imaging of the chest was performed following the standard protocol without IV contrast. COMPARISON:  Chest CT angiogram dated 06/06/2013. FINDINGS: Mediastinum/Lymph Nodes: Scattered atherosclerotic changes along the walls of the normal-caliber thoracic aorta. Cardiomegaly is stable. No pericardial effusion. Coronary artery calcifications noted. No mass or enlarged lymph nodes seen within the mediastinum or perihilar regions. Lungs/Pleura: Small patchy consolidations at each lung base, most likely atelectasis. No pleural effusion or pneumothorax. Trachea and central bronchi are unremarkable. Upper  abdomen: Limited images of the upper abdomen are unremarkable. Status post cholecystectomy. Fatty infiltration of the pancreas. Musculoskeletal: Degenerative changes throughout the thoracic spine, at least moderate in degree. Additional degenerative changes at the left shoulder. Anterior cervical fusion hardware partially imaged. No acute or suspicious osseous lesion. IMPRESSION: 1. Patchy small consolidations at each lung base, most likely atelectasis. Pneumonia is felt to be much less likely. Lungs otherwise clear. No evidence of pneumonia. No pleural effusion. 2. Cardiomegaly.  No evidence of CHF.  No pericardial effusion. 3. Coronary artery calcifications, particularly dense within the left main coronary artery. Recommend correlation with any possible associated cardiac symptoms. Electronically Signed   By: Bary Richard M.D.   On: 11/16/2015 10:53   Ct Abdomen Pelvis W Contrast  11/16/2015  CLINICAL DATA:  Diffuse abdominal pain EXAM: CT ABDOMEN AND PELVIS WITH CONTRAST TECHNIQUE: Multidetector CT imaging of the abdomen and pelvis was performed using the standard protocol following bolus administration of intravenous contrast. CONTRAST:  80 cc Isovue 300 intravenous COMPARISON:  06/05/2013 FINDINGS: Lower chest and abdominal wall: Fatty enlargement of the bilateral inguinal canal. Stable simple lipoma extending ventral to the right iliopsoas. Cardiomegaly. Bilateral dependent atelectasis. Left lower lobe calcified granuloma Hepatobiliary: No focal liver abnormality.Cholecystectomy with negative common bile duct. Pancreas: Fatty atrophy without acute finding Spleen: Unremarkable. Adrenals/Urinary Tract: Negative adrenals. No hydronephrosis or stone. Symmetric mild renal cortical thinning. No acute bladder finding. Mild bladder wall thickening likely from chronic outlet obstruction Reproductive:Symmetric prostate enlargement Stomach/Bowel: No obstruction. No appendicitis. Colonic diverticulosis.  Vascular/Lymphatic: No acute vascular abnormality. No mass or adenopathy. Peritoneal: No ascites or pneumoperitoneum. Musculoskeletal: No acute finding. Advanced and diffuse disc and facet degeneration status post L4-5 discectomy. Multiple Schmorl's nodes are chronic. L2 hemangioma. IMPRESSION: 1. Stable since 2014.  No acute finding to explain abdominal pain.  2. Chronic findings are described above. Electronically Signed   By: Marnee Spring M.D.   On: 11/16/2015 03:03     Discharge Exam: Blood pressure 124/59, pulse 63, temperature 97.2 F (36.2 C), temperature source Oral, resp. rate 16, height 5\' 6"  (1.676 m), weight 96.208 kg (212 lb 1.6 oz), SpO2 98 %. General exam: Appears calm and comfortable  Respiratory system: Diminished breath sounds at bases, no wheezing, dullness to percussion at bases  Cardiovascular system: S1 & S2 heard, No JVD, rubs, gallops or clicks. No pedal edema. Gastrointestinal system: Abdomen is nondistended, soft and nontender.  Central nervous system: Alert and oriented. No focal neurological deficits  Skin: No rashes, lesions or ulcers  Disposition: 01-Home or Self Care     Medication List    STOP taking these medications        hydrochlorothiazide 25 MG tablet  Commonly known as:  HYDRODIURIL     ibuprofen 200 MG tablet  Commonly known as:  ADVIL,MOTRIN      TAKE these medications        ADVAIR DISKUS 250-50 MCG/DOSE Aepb  Generic drug:  Fluticasone-Salmeterol  Inhale 1 puff into the lungs 2 (two) times daily.     apixaban 5 MG Tabs tablet  Commonly known as:  ELIQUIS  Take 1 tablet (5 mg total) by mouth 2 (two) times daily.     brimonidine 0.2 % ophthalmic solution  Commonly known as:  ALPHAGAN  Place 1 drop into both eyes daily.     carvedilol 12.5 MG tablet  Commonly known as:  COREG  Take 12.5 mg by mouth 2 (two) times daily.     clopidogrel 75 MG tablet  Commonly known as:  PLAVIX  Take 75 mg by mouth daily.     doxycycline  100 MG tablet  Commonly known as:  VIBRA-TABS  Take 1 tablet (100 mg total) by mouth every 12 (twelve) hours.     furosemide 40 MG tablet  Commonly known as:  LASIX  Take 40 mg by mouth daily as needed for fluid or edema.     gabapentin 300 MG capsule  Commonly known as:  NEURONTIN  Take 300 mg by mouth at bedtime.     insulin glargine 100 UNIT/ML injection  Commonly known as:  LANTUS  Inject 0.31 mLs (31 Units total) into the skin at bedtime.     ipratropium-albuterol 0.5-2.5 (3) MG/3ML Soln  Commonly known as:  DUONEB  Take 3 mLs by nebulization every 6 (six) hours as needed (for SOB).     latanoprost 0.005 % ophthalmic solution  Commonly known as:  XALATAN  Place 1 drop into both eyes at bedtime. Reported on 11/15/2015     losartan 25 MG tablet  Commonly known as:  COZAAR  Take 1 tablet (25 mg total) by mouth daily.     meclizine 25 MG tablet  Commonly known as:  ANTIVERT  Take 1 tablet (25 mg total) by mouth 3 (three) times daily as needed for dizziness.     nateglinide 120 MG tablet  Commonly known as:  STARLIX  Take 120 mg by mouth daily.     Omega-3 1000 MG Caps  Take 1 g by mouth daily.     omeprazole 20 MG capsule  Commonly known as:  PRILOSEC  Take 20 mg by mouth 2 (two) times daily.     ondansetron 4 MG disintegrating tablet  Commonly known as:  ZOFRAN ODT  4mg  ODT q6 hours prn nausea/vomit  polyvinyl alcohol 1.4 % ophthalmic solution  Commonly known as:  LIQUIFILM TEARS  Place 1 drop into both eyes as needed for dry eyes.     potassium chloride SA 20 MEQ tablet  Commonly known as:  K-DUR,KLOR-CON  Take 20 mEq by mouth daily.     PROAIR HFA 108 (90 Base) MCG/ACT inhaler  Generic drug:  albuterol  Inhale 2 puffs into the lungs every 4 (four) hours as needed.     promethazine 25 MG tablet  Commonly known as:  PHENERGAN  Take 12.5-25 mg by mouth every 4 (four) hours as needed.     simvastatin 40 MG tablet  Commonly known as:  ZOCOR  Take 40  mg by mouth every evening.     SPIRIVA HANDIHALER 18 MCG inhalation capsule  Generic drug:  tiotropium  Place 1 application into inhaler and inhale daily.     terazosin 5 MG capsule  Commonly known as:  HYTRIN  Take 5 mg by mouth every evening.     triamcinolone cream 0.1 %  Commonly known as:  KENALOG  Apply 1 application topically 2 (two) times daily as needed (for irritation).     zolpidem 10 MG tablet  Commonly known as:  AMBIEN  Take 10 mg by mouth at bedtime as needed for sleep.           Follow-up Information    Follow up with [facility]   Specialty:  General Practice   Why:  recent hospital admission   Contact information:   [address]  [facility] [place] [address] [phone]       Follow up with Harold Hedge A., MD In 1 week.   Specialty:  Cardiology   Why:  new CHF and AFIB medical regimen   Contact information:   [address] [facility] [address] [facility] [place] [facility] - CARDIOLOGY [place] [address] [phone]       Signed: [name] 11/21/2015, 4:09 PM

## 2015-11-21 NOTE — Progress Notes (Signed)
Orders received for pt discharge.  Discharge summary printed and reviewed with pt.  Explained medication regimen, and pt had no further questions at this time.  IV removed and site remains clean, dry, intact.  Telemetry removed.  Pt in stable condition and awaiting transport. 

## 2015-11-21 NOTE — Progress Notes (Signed)
Patient Name: Bobby Krueger Date of Encounter: 11/21/2015  Primary Cardiologist: new (previously seen by Dr. Lady Gary)   Pt. Profile  Bobby Krueger is a pleasant 82 year old Caucasian male with past medical history of HTN, HLD, DM, COPD, old LBBB and a history of CHF presented with chest pain, presyncope, hypoxia and weakness and was treated for dehydration, and later possible bronchitis. Found to have EF 40-45%, new afib and aflutter that occurred during this hospitalization   Principal Problem:   Dehydration Active Problems:   AKI (acute kidney injury) (HCC)   Leukocytosis   CAD (coronary artery disease)   COPD (chronic obstructive pulmonary disease) (HCC)   GERD (gastroesophageal reflux disease)   Dyspnea   Atrial fibrillation (HCC)   Generalized abdominal pain    SUBJECTIVE  Feels well overnight. Denies any CP or SOB. Still has the epigastric pain on palpation, but he does not know how long he has been having it, he says he had it for a long time.  CURRENT MEDS . antiseptic oral rinse  7 mL Mouth Rinse BID  . apixaban  5 mg Oral BID  . brimonidine  1 drop Both Eyes Daily  . carvedilol  12.5 mg Oral BID  . doxycycline  100 mg Oral Q12H  . fluticasone furoate-vilanterol  1 puff Inhalation Daily  . gabapentin  300 mg Oral QHS  . insulin aspart  0-9 Units Subcutaneous TID WC  . insulin glargine  31 Units Subcutaneous QHS  . latanoprost  1 drop Both Eyes QHS  . losartan  25 mg Oral Daily  . omega-3 acid ethyl esters  1,000 mg Oral Daily  . pantoprazole  40 mg Oral Daily  . simvastatin  40 mg Oral QPM  . sodium chloride  500 mL Intravenous Once  . sodium chloride flush  3 mL Intravenous Q12H  . terazosin  5 mg Oral QPM  . tiotropium  18 mcg Inhalation Daily    OBJECTIVE  Filed Vitals:   11/20/15 0837 11/20/15 1211 11/20/15 2100 11/21/15 0436  BP:  129/71 139/80 103/53  Pulse:  88 80 72  Temp:  97.9 F (36.6 C) 98.1 F (36.7 C) 98 F (36.7 C)  TempSrc:  Oral Oral  Oral  Resp:  12 18 20   Height:      Weight:    212 lb 1.6 oz (96.208 kg)  SpO2: 88% 97% 98% 99%    Intake/Output Summary (Last 24 hours) at 11/21/15 0752 Last data filed at 11/21/15 0630  Gross per 24 hour  Intake   1135 ml  Output   1050 ml  Net     85 ml   Filed Weights   11/19/15 0557 11/20/15 0450 11/21/15 0436  Weight: 212 lb 14.4 oz (96.571 kg) 212 lb 9.6 oz (96.435 kg) 212 lb 1.6 oz (96.208 kg)    PHYSICAL EXAM  General: Pleasant, NAD. Neuro: Alert and oriented X 3. Moves all extremities spontaneously. Psych: Normal affect. HEENT:  Normal  Neck: Supple without bruits or JVD. Lungs:  Resp regular and unlabored. Intermittent rale in bilateral base (atelectasis?) Heart: RRR no s3, s4, or murmurs. Abdomen: Soft, non-tender, non-distended, BS + x 4.  Extremities: No clubbing, cyanosis or edema. DP/PT/Radials 2+ and equal bilaterally.  Accessory Clinical Findings  CBC  Recent Labs  11/20/15 0334 11/21/15 0442  WBC 5.9 4.8  HGB 11.6* 11.7*  HCT 34.3* 34.4*  MCV 89.8 89.8  PLT 132* 151   Basic Metabolic Panel  Recent Labs  11/20/15 0334 11/21/15 0442  NA 142 142  K 3.6 3.6  CL 108 105  CO2 26 30  GLUCOSE 87 68  BUN 5* 6  CREATININE 0.90 0.91  CALCIUM 8.9 9.1   Thyroid Function Tests  Recent Labs  11/20/15 0334  TSH 1.519    TELE Alternating aflutter and sinus rhythm    ECG  No new EKG  Echocardiogram 11/18/2015  LV EF: 40% - 45%  ------------------------------------------------------------------- Indications: Dyspnea 786.09.  ------------------------------------------------------------------- History: PMH: Dyspnea. Congestive heart failure. Chronic obstructive pulmonary disease. Risk factors: Hypertension. Diabetes mellitus.  ------------------------------------------------------------------- Study Conclusions  - Left ventricle: The cavity size was normal. Systolic function was  mildly to moderately reduced.  The estimated ejection fraction was  in the range of 40% to 45%. Hypokinesis of the anteroseptal,  inferior, and inferoseptal myocardium. Doppler parameters are  consistent with abnormal left ventricular relaxation (grade 1  diastolic dysfunction). - Aortic valve: Transvalvular velocity was within the normal range.  There was no stenosis. There was no regurgitation. - Mitral valve: Transvalvular velocity was within the normal range.  There was no evidence for stenosis. There was trivial  regurgitation. - Left atrium: The atrium was severely dilated. - Right ventricle: The cavity size was normal. Wall thickness was  normal. Systolic function was normal. - Atrial septum: No defect or patent foramen ovale was identified  by color flow Doppler. - Tricuspid valve: There was trivial regurgitation. - Pulmonary arteries: Systolic pressure was mildly increased. PA  peak pressure: 45 mm Hg (S). - Inferior vena cava: The vessel was normal in size. The  respirophasic diameter changes were in the normal range (>= 50%),  consistent with normal central venous pressure.    Radiology/Studies  Dg Chest 2 View  11/15/2015  CLINICAL DATA:  Near syncope.  Dyspnea EXAM: CHEST  2 VIEW COMPARISON:  05/06/2015 FINDINGS: Chronic cardiomegaly and aortic tortuosity. Chronic prominence of basilar lung markings, likely mild scar. There is no edema, consolidation, effusion, or pneumothorax. IMPRESSION: Stable.  No evidence of acute disease. Chronic cardiomegaly. Electronically Signed   By: Marnee Spring M.D.   On: 11/15/2015 21:56   Ct Head Wo Contrast  11/16/2015  CLINICAL DATA:  Dizziness and difficulty walking. History of hypertension and diabetes. EXAM: CT HEAD WITHOUT CONTRAST TECHNIQUE: Contiguous axial images were obtained from the base of the skull through the vertex without intravenous contrast. COMPARISON:  CT HEAD May 06, 2015 FINDINGS: INTRACRANIAL CONTENTS: The ventricles and sulci are  normal for age. No intraparenchymal hemorrhage, mass effect nor midline shift. Patchy supratentorial white matter hypodensities are within normal range for patient's age and though non-specific likely represent chronic small vessel ischemic disease. No acute large vascular territory infarcts. No abnormal extra-axial fluid collections. Basal cisterns are patent. Moderate calcific atherosclerosis of the carotid siphons. ORBITS: The included ocular globes and orbital contents are non-suspicious. Status post LEFT ocular lens implant. SINUSES: Minimal LEFT mastoid effusion. Trace paranasal sinus mucosal thickening. Bilateral hearing aids in place, if MRI is performed the should be removed. SKULL/SOFT TISSUES: No skull fracture. No significant soft tissue swelling. Small LEFT occipital scalp lipoma. IMPRESSION: No acute intracranial process; negative CT HEAD for age. Electronically Signed   By: Awilda Metro M.D.   On: 11/16/2015 02:36   Ct Chest Wo Contrast  11/16/2015  CLINICAL DATA:  Dyspnea, nausea and vomiting, generalized weakness. EXAM: CT CHEST WITHOUT CONTRAST TECHNIQUE: Multidetector CT imaging of the chest was performed following the standard protocol without IV contrast. COMPARISON:  Chest  CT angiogram dated 06/06/2013. FINDINGS: Mediastinum/Lymph Nodes: Scattered atherosclerotic changes along the walls of the normal-caliber thoracic aorta. Cardiomegaly is stable. No pericardial effusion. Coronary artery calcifications noted. No mass or enlarged lymph nodes seen within the mediastinum or perihilar regions. Lungs/Pleura: Small patchy consolidations at each lung base, most likely atelectasis. No pleural effusion or pneumothorax. Trachea and central bronchi are unremarkable. Upper abdomen: Limited images of the upper abdomen are unremarkable. Status post cholecystectomy. Fatty infiltration of the pancreas. Musculoskeletal: Degenerative changes throughout the thoracic spine, at least moderate in degree.  Additional degenerative changes at the left shoulder. Anterior cervical fusion hardware partially imaged. No acute or suspicious osseous lesion. IMPRESSION: 1. Patchy small consolidations at each lung base, most likely atelectasis. Pneumonia is felt to be much less likely. Lungs otherwise clear. No evidence of pneumonia. No pleural effusion. 2. Cardiomegaly.  No evidence of CHF.  No pericardial effusion. 3. Coronary artery calcifications, particularly dense within the left main coronary artery. Recommend correlation with any possible associated cardiac symptoms. Electronically Signed   By: Bary Richard M.D.   On: 11/16/2015 10:53   Ct Abdomen Pelvis W Contrast  11/16/2015  CLINICAL DATA:  Diffuse abdominal pain EXAM: CT ABDOMEN AND PELVIS WITH CONTRAST TECHNIQUE: Multidetector CT imaging of the abdomen and pelvis was performed using the standard protocol following bolus administration of intravenous contrast. CONTRAST:  80 cc Isovue 300 intravenous COMPARISON:  06/05/2013 FINDINGS: Lower chest and abdominal wall: Fatty enlargement of the bilateral inguinal canal. Stable simple lipoma extending ventral to the right iliopsoas. Cardiomegaly. Bilateral dependent atelectasis. Left lower lobe calcified granuloma Hepatobiliary: No focal liver abnormality.Cholecystectomy with negative common bile duct. Pancreas: Fatty atrophy without acute finding Spleen: Unremarkable. Adrenals/Urinary Tract: Negative adrenals. No hydronephrosis or stone. Symmetric mild renal cortical thinning. No acute bladder finding. Mild bladder wall thickening likely from chronic outlet obstruction Reproductive:Symmetric prostate enlargement Stomach/Bowel: No obstruction. No appendicitis. Colonic diverticulosis. Vascular/Lymphatic: No acute vascular abnormality. No mass or adenopathy. Peritoneal: No ascites or pneumoperitoneum. Musculoskeletal: No acute finding. Advanced and diffuse disc and facet degeneration status post L4-5 discectomy. Multiple  Schmorl's nodes are chronic. L2 hemangioma. IMPRESSION: 1. Stable since 2014.  No acute finding to explain abdominal pain. 2. Chronic findings are described above. Electronically Signed   By: Marnee Spring M.D.   On: 11/16/2015 03:03    ASSESSMENT AND PLAN  1. Newly diagnosed atrial fibrillation and atrial flutter: he has both, currently in aflutter - CHA2DS2-Vasc score 6 (HTN, DM, HF, CAD, age) - rate controlled on coreg 12.5mg  BID - plavix stopped, switched to eliquis 5mg  BID. HR stable overnight, mainly in 60-70s, occasional dips down to mid 40s-50s while in sinus rhythm but appears to be transient. Also has occasional tachycardic episodes as well  - no further recommendation by cardiology service, he is stable from cardiac perspective for discharge   2. Chronic combined systolic and diastolic HF: He does not appears to be in acute CHF at this point.  - Echo in 2015 EF 35%. Now EF 40-45% - Compared with the previous echocardiogram, his EF actually improved. He is a poor interventional candidate. Given the fact that he only had one episode of chest discomfort before he came and has no recurrence since then. We'll hold off on pursuing any further workup. - he likely has CAD based on calcification seen on CT of chest  - continue coreg, losartan added. BP stable.   3. Abdominal discomfort: he has epigastric pain, he says this is ongoing for a long  time, he has never noticed any bleeding, denies prior knowledge of gastritis and peptic ulcer  - per IM  4. HTN 5. HLD 6. DM 7. COPD 8. old LBBB   Signed, Azalee Course PA-C Pager: [phone] As above, patient seen and examined.He denies chest pain or dyspnea. He remains in sinus rhythm today. Continue Coreg and apixaban. Plan medical therapy for cardiomyopathy given age and multiple comorbidities. Continue ARB and beta blocker. Continue statin for presumed coronary artery disease. No  aspirin given need for anticoagulation. Patient should follow-up with Dr. Lady Gary in [place] following DC. We will sign off. Please call with questions. Olga Millers

## 2015-12-21 ENCOUNTER — Emergency Department (HOSPITAL_COMMUNITY): Payer: Medicare Other

## 2015-12-21 ENCOUNTER — Encounter (HOSPITAL_COMMUNITY): Payer: Self-pay

## 2015-12-21 ENCOUNTER — Emergency Department (HOSPITAL_COMMUNITY)
Admission: EM | Admit: 2015-12-21 | Discharge: 2015-12-22 | Disposition: A | Discharge status: Home / Self Care | Payer: Medicare Other | Attending: Emergency Medicine | Admitting: Emergency Medicine

## 2015-12-21 DIAGNOSIS — H578 Other specified disorders of eye and adnexa: Secondary | ICD-10-CM | POA: Insufficient documentation

## 2015-12-21 DIAGNOSIS — R52 Pain, unspecified: Secondary | ICD-10-CM

## 2015-12-21 DIAGNOSIS — Z87891 Personal history of nicotine dependence: Secondary | ICD-10-CM | POA: Insufficient documentation

## 2015-12-21 DIAGNOSIS — I10 Essential (primary) hypertension: Secondary | ICD-10-CM | POA: Diagnosis not present

## 2015-12-21 DIAGNOSIS — R110 Nausea: Secondary | ICD-10-CM | POA: Diagnosis not present

## 2015-12-21 DIAGNOSIS — Z7951 Long term (current) use of inhaled steroids: Secondary | ICD-10-CM | POA: Diagnosis not present

## 2015-12-21 DIAGNOSIS — R000 Tachycardia, unspecified: Secondary | ICD-10-CM | POA: Insufficient documentation

## 2015-12-21 DIAGNOSIS — R079 Chest pain, unspecified: Secondary | ICD-10-CM | POA: Diagnosis not present

## 2015-12-21 DIAGNOSIS — E119 Type 2 diabetes mellitus without complications: Secondary | ICD-10-CM | POA: Insufficient documentation

## 2015-12-21 DIAGNOSIS — E7800 Pure hypercholesterolemia, unspecified: Secondary | ICD-10-CM | POA: Insufficient documentation

## 2015-12-21 DIAGNOSIS — Z794 Long term (current) use of insulin: Secondary | ICD-10-CM | POA: Insufficient documentation

## 2015-12-21 DIAGNOSIS — J449 Chronic obstructive pulmonary disease, unspecified: Secondary | ICD-10-CM | POA: Insufficient documentation

## 2015-12-21 DIAGNOSIS — R1084 Generalized abdominal pain: Secondary | ICD-10-CM | POA: Insufficient documentation

## 2015-12-21 DIAGNOSIS — R42 Dizziness and giddiness: Secondary | ICD-10-CM | POA: Insufficient documentation

## 2015-12-21 DIAGNOSIS — R5383 Other fatigue: Secondary | ICD-10-CM | POA: Insufficient documentation

## 2015-12-21 DIAGNOSIS — I509 Heart failure, unspecified: Secondary | ICD-10-CM | POA: Insufficient documentation

## 2015-12-21 DIAGNOSIS — Z79899 Other long term (current) drug therapy: Secondary | ICD-10-CM | POA: Insufficient documentation

## 2015-12-21 LAB — COMPREHENSIVE METABOLIC PANEL
ALBUMIN: 3.2 g/dL — AB (ref 3.5–5.0)
ALK PHOS: 85 U/L (ref 38–126)
ALT: 15 U/L — AB (ref 17–63)
AST: 17 U/L (ref 15–41)
Anion gap: 4 — ABNORMAL LOW (ref 5–15)
BILIRUBIN TOTAL: 1 mg/dL (ref 0.3–1.2)
BUN: 8 mg/dL (ref 6–20)
CALCIUM: 9.3 mg/dL (ref 8.9–10.3)
CO2: 29 mmol/L (ref 22–32)
CREATININE: 1.08 mg/dL (ref 0.61–1.24)
Chloride: 106 mmol/L (ref 101–111)
GFR calc Af Amer: >60 mL/min (ref >60)
GFR, EST NON AFRICAN AMERICAN: 60 mL/min — AB (ref >60)
GLUCOSE: 173 mg/dL — AB (ref 65–99)
POTASSIUM: 3.4 mmol/L — AB (ref 3.5–5.1)
Sodium: 139 mmol/L (ref 135–145)
TOTAL PROTEIN: 6.6 g/dL (ref 6.5–8.1)

## 2015-12-21 LAB — CBC WITH DIFFERENTIAL/PLATELET
Basophils Absolute: 0 10*3/uL (ref 0.0–0.1)
Basophils Relative: 0 %
Eosinophils Absolute: 0.2 10*3/uL (ref 0.0–0.7)
Eosinophils Relative: 4 %
HEMATOCRIT: 38.9 % — AB (ref 39.0–52.0)
HEMOGLOBIN: 13.2 g/dL (ref 13.0–17.0)
LYMPHS ABS: 1.6 10*3/uL (ref 0.7–4.0)
LYMPHS PCT: 27 %
MCH: 29.6 pg (ref 26.0–34.0)
MCHC: 33.9 g/dL (ref 30.0–36.0)
MCV: 87.2 fL (ref 78.0–100.0)
MONO ABS: 0.5 10*3/uL (ref 0.1–1.0)
MONOS PCT: 8 %
Neutro Abs: 3.7 10*3/uL (ref 1.7–7.7)
Neutrophils Relative %: 61 %
Platelets: 175 10*3/uL (ref 150–400)
RBC: 4.46 MIL/uL (ref 4.22–5.81)
RDW: 12.4 % (ref 11.5–15.5)
WBC: 5.9 10*3/uL (ref 4.0–10.5)

## 2015-12-21 LAB — LIPASE, BLOOD: Lipase: 16 U/L (ref 11–51)

## 2015-12-21 LAB — I-STAT TROPONIN, ED: Troponin i, poc: 0 ng/mL (ref 0.00–0.08)

## 2015-12-21 LAB — I-STAT CG4 LACTIC ACID, ED: Lactic Acid, Venous: 1.48 mmol/L (ref 0.5–2.0)

## 2015-12-21 MED ORDER — SODIUM CHLORIDE 0.9 % IV SOLN
Freq: Once | INTRAVENOUS | Status: AC
  Administered 2015-12-21: 21:00:00 via INTRAVENOUS

## 2015-12-21 MED ORDER — IOPAMIDOL (ISOVUE-300) INJECTION 61%
INTRAVENOUS | Status: AC
  Administered 2015-12-21: 100 mL
  Filled 2015-12-21: qty 100

## 2015-12-21 MED ORDER — ONDANSETRON 4 MG PO TBDP: 4.0000 mg | ORAL_TABLET | Freq: Three times a day (TID) | ORAL | Status: DC | PRN

## 2015-12-21 NOTE — ED Provider Notes (Signed)
Pt ween and examined.  Reports nausea without vomiting. No significant pain.  Large protuberant abdomen. No focal tenderness guarding or rebound. Normoactive bowel sounds. Reassuring labs. CT shows no acute findings. Given Zofran. His symptoms. Taking by mouth.  Discharge home. Zofran. Advanced diet.  Rolland Porter, MD 12/21/15 2329

## 2015-12-21 NOTE — ED Provider Notes (Signed)
CSN: [id number]     Arrival date & time 12/21/15  1811 History   First MD Initiated Contact with Patient 12/21/15 1822     Chief Complaint  Patient presents with  . Nausea     (Consider location/radiation/quality/duration/timing/severity/associated sxs/prior Treatment) HPI   Patient is a 82 year old male with a history of COPD, CHF, HTN, DM presents the ED with nausea since 7 AM this morning. Patient states he feels sick on his stomach. He states he has not eaten anything today but has been able to drink water. He isn't sure if he ate yesterday. He states associated lightheadedness, fatigue, and right-sided chest pain that lasted roughly 30 minutes. He states a history of right-sided chest pain. He denies abdominal pain, vomiting, shortness of breath, fever, chills, recent illness, diarrhea, constipation, urinary symptoms. Patient did not have a bowel movement today and he has not passed gas. He isn't sure he had a bowel movement yesterday.  Past Medical History  Diagnosis Date  . COPD (chronic obstructive pulmonary disease) (HCC)   . Hypertension   . High cholesterol   . CHF (congestive heart failure) (HCC)   . Diabetes mellitus without complication [facility])    Past Surgical History  Procedure Laterality Date  . Carpal tunnel release    . Cholecystectomy    . Back surgery     Family History  Problem Relation Age of Onset  . Prostate cancer     Social History  Substance Use Topics  . Smoking status: Former Games developer  . Smokeless tobacco: None  . Alcohol Use: No    Review of Systems  Constitutional: Positive for fatigue. Negative for fever and chills.  HENT: Negative for trouble swallowing.   Eyes: Positive for redness.  Respiratory: Negative for cough, chest tightness and shortness of breath.   Cardiovascular: Positive for chest pain. Negative for leg swelling.  Gastrointestinal: Positive for nausea. Negative for vomiting, abdominal pain, diarrhea and abdominal distention.   Genitourinary: Negative for hematuria.  Musculoskeletal: Negative for back pain and neck pain.  Neurological: Positive for light-headedness. Negative for syncope, numbness and headaches.  Psychiatric/Behavioral:       Positive for "memory issues"      Allergies  Aldactone; Aspirin; Ciprofloxacin; Cyclobenzaprine; and Lexapro  Home Medications   Prior to Admission medications   Medication Sig Start Date End Date Taking? Authorizing Provider  albuterol (PROAIR HFA) 108 (90 Base) MCG/ACT inhaler Inhale 2 puffs into the lungs every 4 (four) hours as needed for wheezing or shortness of breath.     Historical Provider, MD  apixaban (ELIQUIS) 5 MG TABS tablet Take 1 tablet (5 mg total) by mouth 2 (two) times daily. 11/21/15   [name], MD  brimonidine (ALPHAGAN) 0.2 % ophthalmic solution Place 1 drop into both eyes daily.     Historical Provider, MD  carvedilol (COREG) 12.5 MG tablet Take 12.5 mg by mouth 2 (two) times daily.    Historical Provider, MD  clopidogrel (PLAVIX) 75 MG tablet Take 75 mg by mouth daily.    Historical Provider, MD  Fluticasone-Salmeterol (ADVAIR DISKUS) 250-50 MCG/DOSE AEPB Inhale 1 puff into the lungs daily as needed (for wheezing or shortness).     Historical Provider, MD  furosemide (LASIX) 40 MG tablet Take 40 mg by mouth daily as needed for fluid or edema.     Historical Provider, MD  gabapentin (NEURONTIN) 300 MG capsule Take 300 mg by mouth at bedtime.    Historical Provider, MD  insulin glargine (LANTUS)  100 UNIT/ML injection Inject 0.31 mLs (31 Units total) into the skin at bedtime. 11/21/15   [name], MD  ipratropium-albuterol (DUONEB) 0.5-2.5 (3) MG/3ML SOLN Take 3 mLs by nebulization every 6 (six) hours as needed (for SOB).    Historical Provider, MD  latanoprost (XALATAN) 0.005 % ophthalmic solution Place 1 drop into both eyes at bedtime. Reported on 12/21/2015    Historical Provider, MD  losartan (COZAAR) 25 MG tablet Take 1 tablet (25 mg total)  by mouth daily. 11/21/15   [name], MD  meclizine (ANTIVERT) 25 MG tablet Take 1 tablet (25 mg total) by mouth 3 (three) times daily as needed for dizziness. 05/06/15   Anne-Caroline Sharma Covert, MD  nateglinide (STARLIX) 120 MG tablet Take 120 mg by mouth daily. 10/02/15   Historical Provider, MD  Omega-3 1000 MG CAPS Take 1 g by mouth daily.    Historical Provider, MD  omeprazole (PRILOSEC) 20 MG capsule Take 20 mg by mouth 2 (two) times daily.    Historical Provider, MD  ondansetron (ZOFRAN ODT) 4 MG disintegrating tablet Take 1 tablet (4 mg total) by mouth every 8 (eight) hours as needed for nausea. 12/21/15   Rolland Porter, MD  polyvinyl alcohol (LIQUIFILM TEARS) 1.4 % ophthalmic solution Place 1 drop into both eyes as needed for dry eyes. Reported on 12/21/2015    Historical Provider, MD  potassium chloride SA (K-DUR,KLOR-CON) 20 MEQ tablet Take 20 mEq by mouth daily.    Historical Provider, MD  promethazine (PHENERGAN) 25 MG tablet Take 12.5-25 mg by mouth every 4 (four) hours as needed.    Historical Provider, MD  simvastatin (ZOCOR) 40 MG tablet Take 40 mg by mouth every evening.    Historical Provider, MD  SPIRIVA HANDIHALER 18 MCG inhalation capsule Place 1 application into inhaler and inhale daily. 10/13/15   Historical Provider, MD  terazosin (HYTRIN) 5 MG capsule Take 5 mg by mouth every evening.    Historical Provider, MD  triamcinolone cream (KENALOG) 0.1 % Apply 1 application topically 2 (two) times daily as needed (for irritation).    Historical Provider, MD  zolpidem (AMBIEN) 10 MG tablet Take 10 mg by mouth at bedtime as needed for sleep.     Historical Provider, MD   BP 108/85 mmHg  Pulse 100  Temp(Src) 98.1 F (36.7 C) (Oral)  Resp 21  SpO2 93% Physical Exam  Constitutional: He appears well-developed and well-nourished. No distress.  Appears uncomfortable although he denies being in pain  HENT:  Head: Normocephalic and atraumatic.  Mouth/Throat: Uvula is midline, oropharynx is  clear and moist and mucous membranes are normal.  Eyes: Pupils are equal, round, and reactive to light. Right eye exhibits no discharge. Left eye exhibits discharge. Right conjunctiva is not injected. Right conjunctiva has no hemorrhage. Left conjunctiva is injected. Left conjunctiva has no hemorrhage.  Cardiovascular: Tachycardia present.   Pulses:      Radial pulses are 2+ on the right side, and 2+ on the left side.  Pulmonary/Chest: Effort normal and breath sounds normal. No accessory muscle usage. No respiratory distress.  Abdominal: Normal appearance. He exhibits distension. Bowel sounds are increased. There is tenderness. There is guarding. There is no rigidity.  Generalized TTP throughout all quadrants with guarding worse in the LLQ  Musculoskeletal: He exhibits no edema.  Neurological: He is alert. Coordination normal.  Skin: Skin is warm and dry. He is not diaphoretic.  Psychiatric: He has a normal mood and affect. His behavior is normal.  ED Course  Procedures (including critical care time) Labs Review Labs Reviewed  COMPREHENSIVE METABOLIC PANEL - Abnormal; Notable for the following:    Potassium 3.4 (*)    Glucose, Bld 173 (*)    Albumin 3.2 (*)    ALT 15 (*)    GFR calc non Af Amer 60 (*)    Anion gap 4 (*)    All other components within normal limits  CBC WITH DIFFERENTIAL/PLATELET - Abnormal; Notable for the following:    HCT 38.9 (*)    All other components within normal limits  LIPASE, BLOOD  I-STAT CG4 LACTIC ACID, ED  Rosezena Sensor, ED    Imaging Review Dg Chest 2 View  12/21/2015  CLINICAL DATA:  Abdominal pain and weakness. EXAM: CHEST  2 VIEW COMPARISON:  November 15, 2015 FINDINGS: Stable cardiomegaly. The hila and the mediastinum are normal. No pneumothorax. No pulmonary nodules or masses. No focal infiltrates. IMPRESSION: No active cardiopulmonary disease. Electronically Signed   By: Gerome Sam III M.D   On: 12/21/2015 20:48   Ct Abdomen Pelvis W  Contrast  12/21/2015  CLINICAL DATA:  82 year old male with nausea abdominal pain. EXAM: CT ABDOMEN AND PELVIS WITH CONTRAST TECHNIQUE: Multidetector CT imaging of the abdomen and pelvis was performed using the standard protocol following bolus administration of intravenous contrast. CONTRAST:  ISOVUE-300 IOPAMIDOL (ISOVUE-300) INJECTION 61% COMPARISON:  CT dated 11/16/2015 FINDINGS: Bibasilar linear atelectasis/ scarring. There is a subcentimeter left lung base calcified granuloma. No intra-abdominal free air or free fluid. Cholecystectomy. The liver, pancreas, spleen, and the adrenal glands appear unremarkable. Mild moderate bilateral renal parenchymal atrophy. There is no hydronephrosis on either side. The visualized ureters appear unremarkable. There is a trabecular appearance of the bladder wall compatible with chronic bladder outlet obstruction. Correlation with urinalysis recommended to exclude cystitis. Prostate gland is mildly enlarged and heterogeneous measuring 6 mm in transverse axial dimension. Scattered colonic diverticula. There is no evidence of bowel obstruction or active inflammation. Normal appendix There is moderate aortoiliac atherosclerotic disease. The origins of the celiac axis, SMA, IMA as well as the origins of the renal arteries appear patent. No portal venous gas identified. There is no adenopathy. There is small or fat containing bilateral inguinal hernias. There is osteopenia with multilevel degenerative changes of the spine, multilevel old-appearing compression deformity, Schmorl's nodes, disc desiccation. L2 hemangioma. No acute fracture. IMPRESSION: No evidence of bowel obstruction or active inflammation. Normal appendix. Evidence of chronic bladder outlet obstruction. Electronically Signed   By: Elgie Collard M.D.   On: 12/21/2015 23:11   I have personally reviewed and evaluated these images and lab results as part of my medical decision-making.   EKG  Interpretation None      MDM   Final diagnoses:  Pain  Nausea   Patient with nausea. Patient is nontoxic, nonseptic appearing, in no apparent distress.  Patient's nausea adequately managed in emergency department. Patient without vomiting and able to tolerate food and liquids while in the ED. Patient abdomin tender on my exam. Patient's abdomen nontender on Dr. Fayrene Fearing exam. Labs, imaging and vitals reviewed. Due to patient's cardiac history I was concerned that his nausea could be related to an MI. Patient's troponin was negative, EKG reviewed by me without acute changes from last tracing, and chest x-ray with no active cardiopulmonary disease. Patient's nausea less concerning for cardio etiology. Patient does not meet the SIRS or Sepsis criteria.  On Dr. Fayrene Fearing exam, patient does not have a surgical abdomin and  there are no peritoneal signs.  No indication of appendicitis, bowel obstruction, bowel perforation, diverticulitis. Patient discharged home with symptomatic treatment and given strict instructions for follow-up with their primary care physician.  I have also discussed reasons to return immediately to the ER.  Pt given a prescription for Zofran upon discharge. Patient expresses understanding and agrees with plan.        Jerre Simon, PA 12/21/15 1761  Rolland Porter, MD 12/30/15 [phone]

## 2015-12-21 NOTE — ED Notes (Signed)
Per EMS:Pt from home began experiencing nausea this morning. Denies any vomiting or diarrhea. Denies any abdominal pain. EMS gave 8mg  zofran enroute. Hx: CHF, Htn, Diabetes. CBG = 163.

## 2015-12-21 NOTE — ED Notes (Signed)
Patient transported to CT 

## 2015-12-21 NOTE — Discharge Instructions (Signed)
Nausea, Adult  Nausea means you feel sick to your stomach or need to throw up (vomit). It may be a sign of a more serious problem. If nausea gets worse, you may throw up. If you throw up a lot, you may lose too much body fluid (dehydration).  HOME CARE   · Get plenty of rest.  · Ask your doctor how to replace body fluid losses (rehydrate).  · Eat small amounts of food. Sip liquids more often.  · Take all medicines as told by your doctor.  GET HELP RIGHT AWAY IF:  · You have a fever.  · You pass out (faint).  · You keep throwing up or have blood in your throw up.  · You are very weak, have dry lips or a dry mouth, or you are very thirsty (dehydrated).  · You have dark or bloody poop (stool).  · You have very bad chest or belly (abdominal) pain.  · You do not get better after 2 days, or you get worse.  · You have a headache.  MAKE SURE YOU:  · Understand these instructions.  · Will watch your condition.  · Will get help right away if you are not doing well or get worse.     This information is not intended to replace advice given to you by your health care provider. Make sure you discuss any questions you have with your health care provider.     Document Released: 06/24/2011 Document Revised: 09/27/2011 Document Reviewed: 06/24/2011  Elsevier Interactive Patient Education ©2016 [employer]

## 2015-12-22 NOTE — ED Notes (Signed)
Pt departed in NAD.  

## 2015-12-23 ENCOUNTER — Telehealth (HOSPITAL_BASED_OUTPATIENT_CLINIC_OR_DEPARTMENT_OTHER): Payer: Self-pay | Admitting: Emergency Medicine

## 2016-01-01 ENCOUNTER — Emergency Department (HOSPITAL_COMMUNITY)
Admission: EM | Admit: 2016-01-01 | Discharge: 2016-01-01 | Disposition: A | Discharge status: Home / Self Care | Payer: Medicare Other | Attending: Emergency Medicine | Admitting: Emergency Medicine

## 2016-01-01 ENCOUNTER — Emergency Department (HOSPITAL_COMMUNITY): Payer: Medicare Other

## 2016-01-01 ENCOUNTER — Encounter (HOSPITAL_COMMUNITY): Payer: Self-pay | Admitting: *Deleted

## 2016-01-01 DIAGNOSIS — Z87891 Personal history of nicotine dependence: Secondary | ICD-10-CM | POA: Diagnosis not present

## 2016-01-01 DIAGNOSIS — Z794 Long term (current) use of insulin: Secondary | ICD-10-CM | POA: Diagnosis not present

## 2016-01-01 DIAGNOSIS — E119 Type 2 diabetes mellitus without complications: Secondary | ICD-10-CM | POA: Diagnosis not present

## 2016-01-01 DIAGNOSIS — R531 Weakness: Secondary | ICD-10-CM | POA: Diagnosis present

## 2016-01-01 DIAGNOSIS — Z79899 Other long term (current) drug therapy: Secondary | ICD-10-CM | POA: Insufficient documentation

## 2016-01-01 DIAGNOSIS — Z7901 Long term (current) use of anticoagulants: Secondary | ICD-10-CM | POA: Diagnosis not present

## 2016-01-01 DIAGNOSIS — I509 Heart failure, unspecified: Secondary | ICD-10-CM | POA: Insufficient documentation

## 2016-01-01 DIAGNOSIS — I110 Hypertensive heart disease with heart failure: Secondary | ICD-10-CM | POA: Insufficient documentation

## 2016-01-01 DIAGNOSIS — E860 Dehydration: Secondary | ICD-10-CM | POA: Diagnosis not present

## 2016-01-01 DIAGNOSIS — J449 Chronic obstructive pulmonary disease, unspecified: Secondary | ICD-10-CM | POA: Insufficient documentation

## 2016-01-01 HISTORY — DX: Unspecified atrial fibrillation: I48.91

## 2016-01-01 LAB — CBC WITH DIFFERENTIAL/PLATELET
BASOS PCT: 0 %
Basophils Absolute: 0 10*3/uL (ref 0.0–0.1)
EOS PCT: 5 %
Eosinophils Absolute: 0.3 10*3/uL (ref 0.0–0.7)
HCT: 38.5 % — ABNORMAL LOW (ref 39.0–52.0)
HEMOGLOBIN: 13 g/dL (ref 13.0–17.0)
LYMPHS PCT: 25 %
Lymphs Abs: 1.4 10*3/uL (ref 0.7–4.0)
MCH: 29.7 pg (ref 26.0–34.0)
MCHC: 33.8 g/dL (ref 30.0–36.0)
MCV: 88.1 fL (ref 78.0–100.0)
MONO ABS: 0.4 10*3/uL (ref 0.1–1.0)
Monocytes Relative: 7 %
NEUTROS ABS: 3.4 10*3/uL (ref 1.7–7.7)
NEUTROS PCT: 63 %
Platelets: 156 10*3/uL (ref 150–400)
RBC: 4.37 MIL/uL (ref 4.22–5.81)
RDW: 12.6 % (ref 11.5–15.5)
WBC: 5.5 10*3/uL (ref 4.0–10.5)

## 2016-01-01 LAB — URINALYSIS, ROUTINE W REFLEX MICROSCOPIC
Bilirubin Urine: NEGATIVE
GLUCOSE, UA: NEGATIVE mg/dL
HGB URINE DIPSTICK: NEGATIVE
KETONES UR: NEGATIVE mg/dL
Leukocytes, UA: NEGATIVE
Nitrite: NEGATIVE
PH: 6.5 (ref 5.0–8.0)
Protein, ur: NEGATIVE mg/dL
SPECIFIC GRAVITY, URINE: 1.006 (ref 1.005–1.030)

## 2016-01-01 LAB — COMPREHENSIVE METABOLIC PANEL
ALK PHOS: 82 U/L (ref 38–126)
ALT: 13 U/L — AB (ref 17–63)
ANION GAP: 9 (ref 5–15)
AST: 18 U/L (ref 15–41)
Albumin: 3.4 g/dL — ABNORMAL LOW (ref 3.5–5.0)
BILIRUBIN TOTAL: 0.6 mg/dL (ref 0.3–1.2)
BUN: 18 mg/dL (ref 6–20)
CALCIUM: 9.1 mg/dL (ref 8.9–10.3)
CO2: 29 mmol/L (ref 22–32)
CREATININE: 1.31 mg/dL — AB (ref 0.61–1.24)
Chloride: 98 mmol/L — ABNORMAL LOW (ref 101–111)
GFR, EST AFRICAN AMERICAN: 55 mL/min — AB (ref >60)
GFR, EST NON AFRICAN AMERICAN: 47 mL/min — AB (ref >60)
Glucose, Bld: 213 mg/dL — ABNORMAL HIGH (ref 65–99)
Potassium: 3.4 mmol/L — ABNORMAL LOW (ref 3.5–5.1)
Sodium: 136 mmol/L (ref 135–145)
TOTAL PROTEIN: 6.4 g/dL — AB (ref 6.5–8.1)

## 2016-01-01 LAB — CBG MONITORING, ED: Glucose-Capillary: 220 mg/dL — ABNORMAL HIGH (ref 65–99)

## 2016-01-01 LAB — I-STAT CG4 LACTIC ACID, ED
Lactic Acid, Venous: 1.63 mmol/L (ref 0.5–2.0)
Lactic Acid, Venous: 1.96 mmol/L (ref 0.5–2.0)

## 2016-01-01 LAB — I-STAT TROPONIN, ED: Troponin i, poc: 0 ng/mL (ref 0.00–0.08)

## 2016-01-01 MED ORDER — SODIUM CHLORIDE 0.9 % IV BOLUS (SEPSIS)
500.0000 mL | Freq: Once | INTRAVENOUS | Status: AC
  Administered 2016-01-01: 500 mL via INTRAVENOUS

## 2016-01-01 NOTE — ED Provider Notes (Signed)
CSN: [id number]     Arrival date & time 01/01/16  6606 History   First MD Initiated Contact with Patient 01/01/16 [id number]     Chief Complaint  Patient presents with  . Weakness  . Chest Pain     (Consider location/radiation/quality/duration/timing/severity/associated sxs/prior Treatment) HPI Comments: 82 y.o. Male with history of COPD, CHF, HTN, DM presents for weakness.  The patient reports that he was having a normal morning and then ate a sausage biscuit and while he was standing in the kitchen when he suddenly felt generally weak.  Although the nurse reported the patient had chest pain, when asked the patient denied chest pain or pressure.  Denied shortness of breath.  No fever, chills, cough.  No abdominal pain.  Did not feel dizzy.  No LOC.  Patient reports that he had a similar episode about 1 month ago and was found to be dehydrated.  Patient reports that this episode is very similar to the previous episode.   Past Medical History  Diagnosis Date  . COPD (chronic obstructive pulmonary disease) (HCC)   . Hypertension   . High cholesterol   . CHF (congestive heart failure) (HCC)   . Diabetes mellitus without complication (HCC)   . A-fib [facility])    Past Surgical History  Procedure Laterality Date  . Carpal tunnel release    . Cholecystectomy    . Back surgery     Family History  Problem Relation Age of Onset  . Prostate cancer     Social History  Substance Use Topics  . Smoking status: Former Games developer  . Smokeless tobacco: None  . Alcohol Use: No    Review of Systems  Constitutional: Positive for fatigue. Negative for fever, appetite change and unexpected weight change.  HENT: Negative for congestion and postnasal drip.   Eyes: Negative for visual disturbance.  Respiratory: Negative for cough, chest tightness, shortness of breath and wheezing.   Cardiovascular: Negative for chest pain, palpitations and leg swelling.  Gastrointestinal: Negative for nausea, vomiting,  abdominal pain and diarrhea.  Genitourinary: Negative for dysuria, hematuria and decreased urine volume.  Musculoskeletal: Negative for myalgias and back pain.  Skin: Negative for rash.  Neurological: Positive for weakness (generalized, no focal weakness). Negative for dizziness, seizures, syncope, light-headedness, numbness and headaches.  Hematological: Does not bruise/bleed easily.      Allergies  Aldactone; Aspirin; Ciprofloxacin; Cyclobenzaprine; and Lexapro  Home Medications   Prior to Admission medications   Medication Sig Start Date End Date Taking? Authorizing Provider  albuterol (PROAIR HFA) 108 (90 Base) MCG/ACT inhaler Inhale 2 puffs into the lungs every 4 (four) hours as needed for wheezing or shortness of breath.    Yes Historical Provider, MD  apixaban (ELIQUIS) 5 MG TABS tablet Take 1 tablet (5 mg total) by mouth 2 (two) times daily. 11/21/15  Yes [name], MD  brimonidine (ALPHAGAN) 0.2 % ophthalmic solution Place 1 drop into both eyes daily.    Yes Historical Provider, MD  carvedilol (COREG) 12.5 MG tablet Take 12.5 mg by mouth 2 (two) times daily.   Yes Historical Provider, MD  clopidogrel (PLAVIX) 75 MG tablet Take 75 mg by mouth daily.   Yes Historical Provider, MD  Fluticasone-Salmeterol (ADVAIR DISKUS) 250-50 MCG/DOSE AEPB Inhale 1 puff into the lungs daily as needed (for wheezing or shortness).    Yes Historical Provider, MD  furosemide (LASIX) 40 MG tablet Take 40 mg by mouth daily as needed for fluid or edema.    Yes  Historical Provider, MD  gabapentin (NEURONTIN) 300 MG capsule Take 300 mg by mouth at bedtime.   Yes Historical Provider, MD  insulin glargine (LANTUS) 100 UNIT/ML injection Inject 0.31 mLs (31 Units total) into the skin at bedtime. Patient taking differently: Inject 62 Units into the skin at bedtime.  11/21/15  Yes [name], MD  ondansetron (ZOFRAN ODT) 4 MG disintegrating tablet Take 1 tablet (4 mg total) by mouth every 8 (eight) hours as  needed for nausea. 12/21/15  Yes Rolland Porter, MD  ipratropium-albuterol (DUONEB) 0.5-2.5 (3) MG/3ML SOLN Take 3 mLs by nebulization every 6 (six) hours as needed (for SOB).    Historical Provider, MD  latanoprost (XALATAN) 0.005 % ophthalmic solution Place 1 drop into both eyes at bedtime. Reported on 12/21/2015    Historical Provider, MD  losartan (COZAAR) 25 MG tablet Take 1 tablet (25 mg total) by mouth daily. 11/21/15   [name], MD  meclizine (ANTIVERT) 25 MG tablet Take 1 tablet (25 mg total) by mouth 3 (three) times daily as needed for dizziness. 05/06/15   Anne-Caroline Sharma Covert, MD  nateglinide (STARLIX) 120 MG tablet Take 120 mg by mouth daily. 10/02/15   Historical Provider, MD  Omega-3 1000 MG CAPS Take 1 g by mouth daily.    Historical Provider, MD  omeprazole (PRILOSEC) 20 MG capsule Take 20 mg by mouth 2 (two) times daily.    Historical Provider, MD  polyvinyl alcohol (LIQUIFILM TEARS) 1.4 % ophthalmic solution Place 1 drop into both eyes as needed for dry eyes. Reported on 12/21/2015    Historical Provider, MD  potassium chloride SA (K-DUR,KLOR-CON) 20 MEQ tablet Take 20 mEq by mouth daily.    Historical Provider, MD  promethazine (PHENERGAN) 25 MG tablet Take 12.5-25 mg by mouth every 4 (four) hours as needed.    Historical Provider, MD  simvastatin (ZOCOR) 40 MG tablet Take 40 mg by mouth every evening.    Historical Provider, MD  SPIRIVA HANDIHALER 18 MCG inhalation capsule Place 1 application into inhaler and inhale daily. 10/13/15   Historical Provider, MD  terazosin (HYTRIN) 5 MG capsule Take 5 mg by mouth every evening.    Historical Provider, MD  triamcinolone cream (KENALOG) 0.1 % Apply 1 application topically 2 (two) times daily as needed (for irritation).    Historical Provider, MD  zolpidem (AMBIEN) 10 MG tablet Take 10 mg by mouth at bedtime as needed for sleep.     Historical Provider, MD   BP 116/68 mmHg  Pulse 78  Temp(Src) 97.9 F (36.6 C) (Oral)  Resp 13  Ht 5\' 6"   (1.676 m)  Wt 206 lb 5.6 oz (93.6 kg)  BMI 33.32 kg/m2  SpO2 98% Physical Exam  Constitutional: He is oriented to person, place, and time. He appears well-developed and well-nourished. No distress.  HENT:  Head: Normocephalic and atraumatic.  Right Ear: External ear normal.  Left Ear: External ear normal.  Mouth/Throat: Oropharynx is clear and moist. No oropharyngeal exudate.  Eyes: EOM are normal. Pupils are equal, round, and reactive to light.  Full visual fields to confrontation  Neck: Normal range of motion. Neck supple.  Cardiovascular: Normal rate and intact distal pulses.  An irregular rhythm present.  No murmur heard. Pulmonary/Chest: Effort normal. No respiratory distress. He has no wheezes. He has no rales.  Abdominal: Soft. He exhibits no distension. There is no tenderness.  Musculoskeletal: Normal range of motion. He exhibits no edema.  Neurological: He is alert and oriented to  person, place, and time. No cranial nerve deficit. He exhibits normal muscle tone. Coordination normal.  Skin: Skin is warm and dry. No rash noted. He is not diaphoretic.  Vitals reviewed.   ED Course  Procedures (including critical care time) Labs Review Labs Reviewed  CBC WITH DIFFERENTIAL/PLATELET - Abnormal; Notable for the following:    HCT 38.5 (*)    All other components within normal limits  COMPREHENSIVE METABOLIC PANEL - Abnormal; Notable for the following:    Potassium 3.4 (*)    Chloride 98 (*)    Glucose, Bld 213 (*)    Creatinine, Ser 1.31 (*)    Total Protein 6.4 (*)    Albumin 3.4 (*)    ALT 13 (*)    GFR calc non Af Amer 47 (*)    GFR calc Af Amer 55 (*)    All other components within normal limits  CBG MONITORING, ED - Abnormal; Notable for the following:    Glucose-Capillary 220 (*)    All other components within normal limits  URINALYSIS, ROUTINE W REFLEX MICROSCOPIC (NOT AT [facility])  I-STAT TROPOININ, ED  I-STAT CG4 LACTIC ACID, ED  I-STAT CG4 LACTIC ACID, ED     Imaging Review Dg Chest 2 View  01/01/2016  CLINICAL DATA:  Generalized weakness EXAM: CHEST  2 VIEW COMPARISON:  12/21/2015 FINDINGS: Cardiomediastinal silhouette is stable. No acute infiltrate or pleural effusion. No pulmonary edema. Degenerative changes bilateral shoulders left greater than right. Metallic fixation plate noted cervical spine. Stable left basilar atelectasis or scarring. Mild degenerative changes thoracic spine. IMPRESSION: No active cardiopulmonary disease. Stable left basilar atelectasis or scarring. Degenerative changes bilateral shoulders and thoracic spine. Electronically Signed   By: Natasha Mead M.D.   On: 01/01/2016 10:37   I have personally reviewed and evaluated these images and lab results as part of my medical decision-making.   EKG Interpretation   Date/Time:  Thursday January 01 2016 09:37:59 EDT Ventricular Rate:  91 PR Interval:    QRS Duration: 158 QT Interval:  413 QTC Calculation: 508 R Axis:   -92 Text Interpretation:  Atrial fibrillation Nonspecific IVCD with LAD  Inferior infarct, old Anterior infarct, old No significant change since  last tracing Confirmed by Tyrone Apple ([id number]) on 01/01/2016 9:43:21 AM      MDM  Patient was seen and evaluated in stable condition.  Benign examination.  No neurologic changes or deficits.  Patient denied chest pain.  EKG similar to previous without acute change.  Nursing note not accurate regarding EKG.  Laboratory studies with mildly elevated Cr but otherwise unremarkable.  Patient given 55 cc bolus with improvement in symptoms.  He was observed for multiple hours in the emergency department and walked in hallway without acute finding or event.  Unclear etiology for episode of weakness but patient reported feeling better and looked well.  Patient was instructed to keep well hydrated and to return with worsening symptoms or new concerning symptoms. Final diagnoses:  None    1. Dehydration  2. Generalized  weakness    Leta Baptist, MD 01/02/16 1421

## 2016-01-01 NOTE — Discharge Instructions (Signed)
You were seen and evaluated today for your generalized weakness. At this time it appears this is likely secondary to some dehydration again. Continue to try to keep yourself well-hydrated. Drink at least 8x8 ounce glasses of water a day. Try to follow up with your primary care physician tomorrow for reevaluation and recheck.   Dehydration, Adult Dehydration is a condition in which you do not have enough fluid or water in your body. It happens when you take in less fluid than you lose. Vital organs such as the kidneys, brain, and heart cannot function without a proper amount of fluids. Any loss of fluids from the body can cause dehydration.  Dehydration can range from mild to severe. This condition should be treated right away to help prevent it from becoming severe. CAUSES  This condition may be caused by:  Vomiting.  Diarrhea.  Excessive sweating, such as when exercising in hot or humid weather.  Not drinking enough fluid during strenuous exercise or during an illness.  Excessive urine output.  Fever.  Certain medicines. RISK FACTORS This condition is more likely to develop in:  People who are taking certain medicines that cause the body to lose excess fluid (diuretics).   People who have a chronic illness, such as diabetes, that may increase urination.  Older adults.   People who live at high altitudes.   People who participate in endurance sports.  SYMPTOMS  Mild Dehydration  Thirst.  Dry lips.  Slightly dry mouth.  Dry, warm skin. Moderate Dehydration  Very dry mouth.   Muscle cramps.   Dark urine and decreased urine production.   Decreased tear production.   Headache.   Light-headedness, especially when you stand up from a sitting position.  Severe Dehydration  Changes in skin.   Cold and clammy skin.   Skin does not spring back quickly when lightly pinched and released.   Changes in body fluids.   Extreme thirst.   No tears.    Not able to sweat when body temperature is high, such as in hot weather.   Minimal urine production.   Changes in vital signs.   Rapid, weak pulse (more than 100 beats per minute when you are sitting still).   Rapid breathing.   Low blood pressure.   Other changes.   Sunken eyes.   Cold hands and feet.   Confusion.  Lethargy and difficulty being awakened.  Fainting (syncope).   Short-term weight loss.   Unconsciousness. DIAGNOSIS  This condition may be diagnosed based on your symptoms. You may also have tests to determine how severe your dehydration is. These tests may include:   Urine tests.   Blood tests.  TREATMENT  Treatment for this condition depends on the severity. Mild or moderate dehydration can often be treated at home. Treatment should be started right away. Do not wait until dehydration becomes severe. Severe dehydration needs to be treated at the hospital. Treatment for Mild Dehydration  Drinking plenty of water to replace the fluid you have lost.   Replacing minerals in your blood (electrolytes) that you may have lost.  Treatment for Moderate Dehydration  Consuming oral rehydration solution (ORS). Treatment for Severe Dehydration  Receiving fluid through an IV tube.   Receiving electrolyte solution through a feeding tube that is passed through your nose and into your stomach (nasogastric tube or NG tube).  Correcting any abnormalities in electrolytes. HOME CARE INSTRUCTIONS   Drink enough fluid to keep your urine clear or pale yellow.  Drink water or fluid slowly by taking small sips. You can also try sucking on ice cubes.  Have food or beverages that contain electrolytes. Examples include bananas and sports drinks.  Take over-the-counter and prescription medicines only as told by your health care provider.   Prepare ORS according to the manufacturer's instructions. Take sips of ORS every 5 minutes until your urine  returns to normal.  If you have vomiting or diarrhea, continue to try to drink water, ORS, or both.   If you have diarrhea, avoid:   Beverages that contain caffeine.   Fruit juice.   Milk.   Carbonated soft drinks.  Do not take salt tablets. This can lead to the condition of having too much sodium in your body (hypernatremia).  SEEK MEDICAL CARE IF:  You cannot eat or drink without vomiting.  You have had moderate diarrhea during a period of more than 24 hours.  You have a fever. SEEK IMMEDIATE MEDICAL CARE IF:   You have extreme thirst.  You have severe diarrhea.  You have not urinated in 6-8 hours, or you have urinated only a small amount of very dark urine.  You have shriveled skin.  You are dizzy, confused, or both.   This information is not intended to replace advice given to you by your health care provider. Make sure you discuss any questions you have with your health care provider.   Document Released: 07/05/2005 Document Revised: 03/26/2015 Document Reviewed: 11/20/2014 Elsevier Interactive Patient Education [employer]

## 2016-01-01 NOTE — ED Notes (Signed)
Pt ambulated with walker.  States he feels "wobbly", but better than when he arrived. HR and O2 sats remained stable.

## 2016-01-01 NOTE — ED Notes (Addendum)
Pt here via [facility] from home for weakness and chest tightness when he woke up this am.  On home O2 for chf.  Initial bp 70/40, 121/62 after 500 ns, 95% on 2L ST elevation v1-v4, but MD does not feel meets STEMI criteria.  22 RFA.  500 ml NS given en-route.  cbg 223.  AO x 4.

## 2016-01-11 ENCOUNTER — Emergency Department
Admission: EM | Admit: 2016-01-11 | Discharge: 2016-01-11 | Disposition: A | Discharge status: Home / Self Care | Payer: Medicare Other | Attending: Emergency Medicine | Admitting: Emergency Medicine

## 2016-01-11 ENCOUNTER — Encounter: Payer: Self-pay | Admitting: Emergency Medicine

## 2016-01-11 DIAGNOSIS — Z87891 Personal history of nicotine dependence: Secondary | ICD-10-CM | POA: Insufficient documentation

## 2016-01-11 DIAGNOSIS — J449 Chronic obstructive pulmonary disease, unspecified: Secondary | ICD-10-CM | POA: Insufficient documentation

## 2016-01-11 DIAGNOSIS — R110 Nausea: Secondary | ICD-10-CM | POA: Insufficient documentation

## 2016-01-11 DIAGNOSIS — I2510 Atherosclerotic heart disease of native coronary artery without angina pectoris: Secondary | ICD-10-CM | POA: Insufficient documentation

## 2016-01-11 DIAGNOSIS — I4891 Unspecified atrial fibrillation: Secondary | ICD-10-CM | POA: Diagnosis not present

## 2016-01-11 DIAGNOSIS — I509 Heart failure, unspecified: Secondary | ICD-10-CM | POA: Diagnosis not present

## 2016-01-11 DIAGNOSIS — Z79899 Other long term (current) drug therapy: Secondary | ICD-10-CM | POA: Diagnosis not present

## 2016-01-11 DIAGNOSIS — E119 Type 2 diabetes mellitus without complications: Secondary | ICD-10-CM | POA: Insufficient documentation

## 2016-01-11 DIAGNOSIS — I110 Hypertensive heart disease with heart failure: Secondary | ICD-10-CM | POA: Insufficient documentation

## 2016-01-11 DIAGNOSIS — R531 Weakness: Secondary | ICD-10-CM | POA: Diagnosis not present

## 2016-01-11 DIAGNOSIS — Z794 Long term (current) use of insulin: Secondary | ICD-10-CM | POA: Insufficient documentation

## 2016-01-11 LAB — BASIC METABOLIC PANEL
ANION GAP: 8 (ref 5–15)
BUN: 14 mg/dL (ref 6–20)
CHLORIDE: 96 mmol/L — AB (ref 101–111)
CO2: 30 mmol/L (ref 22–32)
Calcium: 9.1 mg/dL (ref 8.9–10.3)
Creatinine, Ser: 1.01 mg/dL (ref 0.61–1.24)
GFR calc Af Amer: >60 mL/min (ref >60)
GLUCOSE: 332 mg/dL — AB (ref 65–99)
POTASSIUM: 3.5 mmol/L (ref 3.5–5.1)
Sodium: 134 mmol/L — ABNORMAL LOW (ref 135–145)

## 2016-01-11 LAB — URINALYSIS COMPLETE WITH MICROSCOPIC (ARMC ONLY)
BACTERIA UA: NONE SEEN
Bilirubin Urine: NEGATIVE
Glucose, UA: >500 mg/dL — AB
Ketones, ur: NEGATIVE mg/dL
LEUKOCYTES UA: NEGATIVE
NITRITE: NEGATIVE
PH: 7 (ref 5.0–8.0)
Protein, ur: NEGATIVE mg/dL
RBC / HPF: NONE SEEN RBC/hpf (ref 0–5)
SQUAMOUS EPITHELIAL / LPF: NONE SEEN
Specific Gravity, Urine: 1.008 (ref 1.005–1.030)
WBC UA: NONE SEEN WBC/hpf (ref 0–5)

## 2016-01-11 LAB — CBC
HEMATOCRIT: 37.1 % — AB (ref 40.0–52.0)
HEMOGLOBIN: 13.3 g/dL (ref 13.0–18.0)
MCH: 31.2 pg (ref 26.0–34.0)
MCHC: 35.8 g/dL (ref 32.0–36.0)
MCV: 87.2 fL (ref 80.0–100.0)
Platelets: 142 10*3/uL — ABNORMAL LOW (ref 150–440)
RBC: 4.26 MIL/uL — ABNORMAL LOW (ref 4.40–5.90)
RDW: 12.8 % (ref 11.5–14.5)
WBC: 5.8 10*3/uL (ref 3.8–10.6)

## 2016-01-11 LAB — TROPONIN I: TROPONIN I: 0.03 ng/mL (ref <0.031)

## 2016-01-11 LAB — GLUCOSE, CAPILLARY: GLUCOSE-CAPILLARY: 291 mg/dL — AB (ref 65–99)

## 2016-01-11 MED ORDER — SODIUM CHLORIDE 0.9 % IV BOLUS (SEPSIS)
500.0000 mL | Freq: Once | INTRAVENOUS | Status: AC
  Administered 2016-01-11: 500 mL via INTRAVENOUS

## 2016-01-11 NOTE — Discharge Instructions (Signed)
Please seek medical attention for any high fevers, chest pain, shortness of breath, change in behavior, persistent vomiting, bloody stool or any other new or concerning symptoms. ° ° °Weakness °Weakness is a lack of strength. It may be felt all over the body (generalized) or in one specific part of the body (focal). Some causes of weakness can be serious. You may need further medical evaluation, especially if you are elderly or you have a history of immunosuppression (such as chemotherapy or HIV), kidney disease, heart disease, or diabetes. °CAUSES  °Weakness can be caused by many different things, including: °· Infection. °· Physical exhaustion. °· Internal bleeding or other blood loss that results in a lack of red blood cells (anemia). °· Dehydration. This cause is more common in elderly people. °· Side effects or electrolyte abnormalities from medicines, such as pain medicines or sedatives. °· Emotional distress, anxiety, or depression. °· Circulation problems, especially severe peripheral arterial disease. °· Heart disease, such as rapid atrial fibrillation, bradycardia, or heart failure. °· Nervous system disorders, such as Guillain-Barré syndrome, multiple sclerosis, or stroke. °DIAGNOSIS  °To find the cause of your weakness, your caregiver will take your history and perform a physical exam. Lab tests or X-rays may also be ordered, if needed. °TREATMENT  °Treatment of weakness depends on the cause of your symptoms and can vary greatly. °HOME CARE INSTRUCTIONS  °· Rest as needed. °· Eat a well-balanced diet. °· Try to get some exercise every day. °· Only take over-the-counter or prescription medicines as directed by your caregiver. °SEEK MEDICAL CARE IF:  °· Your weakness seems to be getting worse or spreads to other parts of your body. °· You develop new aches or pains. °SEEK IMMEDIATE MEDICAL CARE IF:  °· You cannot perform your normal daily activities, such as getting dressed and feeding yourself. °· You  cannot walk up and down stairs, or you feel exhausted when you do so. °· You have shortness of breath or chest pain. °· You have difficulty moving parts of your body. °· You have weakness in only one area of the body or on only one side of the body. °· You have a fever. °· You have trouble speaking or swallowing. °· You cannot control your bladder or bowel movements. °· You have black or bloody vomit or stools. °MAKE SURE YOU: °· Understand these instructions. °· Will watch your condition. °· Will get help right away if you are not doing well or get worse. °  °This information is not intended to replace advice given to you by your health care provider. Make sure you discuss any questions you have with your health care provider. °  °Document Released: 07/05/2005 Document Revised: 01/04/2012 Document Reviewed: 09/03/2011 °Elsevier Interactive Patient Education ©2016 Else[employer] Inc. ° °

## 2016-01-11 NOTE — ED Notes (Signed)
[facility] reports giving patient bolus and zofran 4 mg

## 2016-01-11 NOTE — ED Provider Notes (Signed)
[facility] Emergency Department Provider Note    ____________________________________________  Time seen: ~1855  I have reviewed the triage vital signs and the nursing notes.   HISTORY  Chief Complaint Weakness   History limited by: Not Limited   HPI Bobby Krueger is a 81 y.o. male presents to the emergency department today because of concerns for weakness in nausea. Family says this is now his fourth ED visit in the past 2 months for the same symptoms. They feel something is wrong and none hasn't been able to give him any answers. He did have an admission at [facility] during which time he had CT scans and further tests done which did not find the etiology of the patient's symptoms.Today's episode was similar to previous episodes. At the time of my examination patient states he was feeling better. He has only been checking his sugars once or twice a week. They have not followed up with his primary care doctor for these symptoms at this point. He denies any fevers.   Past Medical History  Diagnosis Date  . COPD (chronic obstructive pulmonary disease) (HCC)   . Hypertension   . High cholesterol   . CHF (congestive heart failure) (HCC)   . Diabetes mellitus without complication (HCC)   . A-fib [facility])     Patient Active Problem List   Diagnosis Date Noted  . Generalized abdominal pain   . Atrial fibrillation (HCC) 11/19/2015  . AKI (acute kidney injury) (HCC) 11/16/2015  . Dehydration 11/16/2015  . Leukocytosis 11/16/2015  . CAD (coronary artery disease) 11/16/2015  . COPD (chronic obstructive pulmonary disease) (HCC) 11/16/2015  . GERD (gastroesophageal reflux disease) 11/16/2015  . Dyspnea 11/16/2015    Past Surgical History  Procedure Laterality Date  . Carpal tunnel release    . Cholecystectomy    . Back surgery      Current Outpatient Rx  Name  Route  Sig  Dispense  Refill  . albuterol (PROAIR HFA) 108 (90 Base) MCG/ACT inhaler    Inhalation   Inhale 2 puffs into the lungs every 4 (four) hours as needed for wheezing or shortness of breath.          Marland Kitchen apixaban (ELIQUIS) 5 MG TABS tablet   Oral   Take 1 tablet (5 mg total) by mouth 2 (two) times daily.   60 tablet   0   . brimonidine (ALPHAGAN) 0.2 % ophthalmic solution   Both Eyes   Place 1 drop into both eyes daily.          . carvedilol (COREG) 12.5 MG tablet   Oral   Take 12.5 mg by mouth 2 (two) times daily.         . clopidogrel (PLAVIX) 75 MG tablet   Oral   Take 75 mg by mouth daily.         . Fluticasone-Salmeterol (ADVAIR DISKUS) 250-50 MCG/DOSE AEPB   Inhalation   Inhale 1 puff into the lungs daily as needed (for wheezing or shortness).          . furosemide (LASIX) 40 MG tablet   Oral   Take 40 mg by mouth daily as needed for fluid or edema.          . gabapentin (NEURONTIN) 300 MG capsule   Oral   Take 300 mg by mouth at bedtime.         . insulin glargine (LANTUS) 100 UNIT/ML injection   Subcutaneous   Inject 0.31  mLs (31 Units total) into the skin at bedtime. Patient taking differently: Inject 62 Units into the skin at bedtime.    10 mL   0   . ipratropium-albuterol (DUONEB) 0.5-2.5 (3) MG/3ML SOLN   Nebulization   Take 3 mLs by nebulization every 6 (six) hours as needed (for SOB).         Marland Kitchen latanoprost (XALATAN) 0.005 % ophthalmic solution   Both Eyes   Place 1 drop into both eyes at bedtime. Reported on 12/21/2015         . losartan (COZAAR) 25 MG tablet   Oral   Take 1 tablet (25 mg total) by mouth daily.   30 tablet   0   . meclizine (ANTIVERT) 25 MG tablet   Oral   Take 1 tablet (25 mg total) by mouth 3 (three) times daily as needed for dizziness.   15 tablet   0   . nateglinide (STARLIX) 120 MG tablet   Oral   Take 120 mg by mouth daily.         . Omega-3 1000 MG CAPS   Oral   Take 1 g by mouth daily.         Marland Kitchen omeprazole (PRILOSEC) 20 MG capsule   Oral   Take 20 mg by mouth 2 (two)  times daily.         . ondansetron (ZOFRAN ODT) 4 MG disintegrating tablet   Oral   Take 1 tablet (4 mg total) by mouth every 8 (eight) hours as needed for nausea.   6 tablet   0   . polyvinyl alcohol (LIQUIFILM TEARS) 1.4 % ophthalmic solution   Both Eyes   Place 1 drop into both eyes as needed for dry eyes. Reported on 12/21/2015         . potassium chloride SA (K-DUR,KLOR-CON) 20 MEQ tablet   Oral   Take 20 mEq by mouth daily.         . promethazine (PHENERGAN) 25 MG tablet   Oral   Take 12.5-25 mg by mouth every 4 (four) hours as needed.         . simvastatin (ZOCOR) 40 MG tablet   Oral   Take 40 mg by mouth every evening.         Marland Kitchen SPIRIVA HANDIHALER 18 MCG inhalation capsule   Inhalation   Place 1 application into inhaler and inhale daily.           Dispense as written.   . terazosin (HYTRIN) 5 MG capsule   Oral   Take 5 mg by mouth every evening.         . triamcinolone cream (KENALOG) 0.1 %   Topical   Apply 1 application topically 2 (two) times daily as needed (for irritation).         Marland Kitchen zolpidem (AMBIEN) 10 MG tablet   Oral   Take 10 mg by mouth at bedtime as needed for sleep.            Allergies Aldactone; Aspirin; Ciprofloxacin; Cyclobenzaprine; and Lexapro  Family History  Problem Relation Age of Onset  . Prostate cancer      Social History Social History  Substance Use Topics  . Smoking status: Former Games developer  . Smokeless tobacco: None  . Alcohol Use: No    Review of Systems  Constitutional: Negative for fever. Cardiovascular: Negative for chest pain. Respiratory: Negative for shortness of breath. Gastrointestinal: Negative for abdominal pain. Positive for nausea  Genitourinary: Negative for dysuria. Musculoskeletal: Negative for back pain. Skin: Negative for rash. Neurological: Negative for headaches, focal weakness or numbness.  10-point ROS otherwise  negative.  ____________________________________________   PHYSICAL EXAM:  VITAL SIGNS: ED Triage Vitals  Enc Vitals Group     BP 01/11/16 1702 180/87 mmHg     Pulse Rate 01/11/16 1702 88     Resp 01/11/16 1702 18     Temp 01/11/16 1702 97.9 F (36.6 C)     Temp Source 01/11/16 1702 Oral     SpO2 01/11/16 1702 91 %     Weight 01/11/16 1702 206 lb (93.441 kg)     Height 01/11/16 1702 5\' 6"  (1.676 m)   Constitutional: Alert and oriented. Well appearing and in no distress. Eyes: Conjunctivae are normal. PERRL. Normal extraocular movements. ENT   Head: Normocephalic and atraumatic.   Nose: No congestion/rhinnorhea.   Mouth/Throat: Mucous membranes are moist.   Neck: No stridor. Hematological/Lymphatic/Immunilogical: No cervical lymphadenopathy. Cardiovascular: Normal rate, regular rhythm.  No murmurs, rubs, or gallops. Respiratory: Normal respiratory effort without tachypnea nor retractions. Breath sounds are clear and equal bilaterally. No wheezes/rales/rhonchi. Gastrointestinal: Soft and nontender. No distention. There is no CVA tenderness. Genitourinary: Deferred Musculoskeletal: Normal range of motion in all extremities. No joint effusions.  No lower extremity tenderness nor edema. Neurologic:  Normal speech and language. No gross focal neurologic deficits are appreciated.  Skin:  Skin is warm, dry and intact. No rash noted. Psychiatric: Mood and affect are normal. Speech and behavior are normal. Patient exhibits appropriate insight and judgment.  ____________________________________________    LABS (pertinent positives/negatives)  Labs Reviewed  BASIC METABOLIC PANEL - Abnormal; Notable for the following:    Sodium 134 (*)    Chloride 96 (*)    Glucose, Bld 332 (*)    All other components within normal limits  CBC - Abnormal; Notable for the following:    RBC 4.26 (*)    HCT 37.1 (*)    Platelets 142 (*)    All other components within normal limits   URINALYSIS COMPLETEWITH MICROSCOPIC ([facility] ONLY) - Abnormal; Notable for the following:    Color, Urine STRAW (*)    APPearance CLEAR (*)    Glucose, UA >500 (*)    Hgb urine dipstick 1+ (*)    All other components within normal limits  GLUCOSE, CAPILLARY - Abnormal; Notable for the following:    Glucose-Capillary 291 (*)    All other components within normal limits  TROPONIN I  CBG MONITORING, ED     ____________________________________________   EKG  None  ____________________________________________    RADIOLOGY  None  ____________________________________________   PROCEDURES  Procedure(s) performed: None  Critical Care performed: No  ____________________________________________   INITIAL IMPRESSION / ASSESSMENT AND PLAN / ED COURSE  Pertinent labs & imaging results that were available during my care of the patient were reviewed by me and considered in my medical decision making (see chart for details).  She presents to the emergency department today because of concerns for weakness and nausea. The symptoms have been going on for a couple of months he has had multiple ED visits for the same. Also has had an admission for the same which did not find an obvious etiology save for dehydration. Blood work here is remarkable for elevated blood sugar however other wise without any concerning findings. Patient did have a CT scan of his abdomen earlier this month which was unremarkable and abdominal exam today is benign. Will  give further fluids to help with sugars however did try to status as the family that at this point he likely she would benefit most from primary care follow-up possible specialist referral.  ____________________________________________   FINAL CLINICAL IMPRESSION(S) / ED DIAGNOSES  Final diagnoses:  Weakness  Nausea     Note: This dictation was prepared with Dragon dictation. Any transcriptional errors that result from this process are  unintentional    Phineas Semen, MD 01/11/16 1949

## 2016-01-11 NOTE — ED Notes (Signed)
Patient brought in by [facility] from home for generalized weakness for the past 6 weeks, patient has been seen at [facility] multiple times recently for same and diagnosed with dehydration.

## 2016-02-02 ENCOUNTER — Emergency Department
Admission: EM | Admit: 2016-02-02 | Discharge: 2016-02-02 | Disposition: A | Discharge status: Home / Self Care | Payer: Medicare Other | Attending: Emergency Medicine | Admitting: Emergency Medicine

## 2016-02-02 ENCOUNTER — Encounter: Payer: Self-pay | Admitting: *Deleted

## 2016-02-02 DIAGNOSIS — H109 Unspecified conjunctivitis: Secondary | ICD-10-CM | POA: Insufficient documentation

## 2016-02-02 DIAGNOSIS — J449 Chronic obstructive pulmonary disease, unspecified: Secondary | ICD-10-CM | POA: Diagnosis not present

## 2016-02-02 DIAGNOSIS — I4891 Unspecified atrial fibrillation: Secondary | ICD-10-CM | POA: Insufficient documentation

## 2016-02-02 DIAGNOSIS — E119 Type 2 diabetes mellitus without complications: Secondary | ICD-10-CM | POA: Diagnosis not present

## 2016-02-02 DIAGNOSIS — I110 Hypertensive heart disease with heart failure: Secondary | ICD-10-CM | POA: Diagnosis not present

## 2016-02-02 DIAGNOSIS — I509 Heart failure, unspecified: Secondary | ICD-10-CM | POA: Insufficient documentation

## 2016-02-02 DIAGNOSIS — Z87891 Personal history of nicotine dependence: Secondary | ICD-10-CM | POA: Diagnosis not present

## 2016-02-02 MED ORDER — FLUORESCEIN SODIUM 1 MG OP STRP
1.0000 | ORAL_STRIP | Freq: Once | OPHTHALMIC | Status: AC
  Administered 2016-02-02: 1 via OPHTHALMIC
  Filled 2016-02-02: qty 1

## 2016-02-02 MED ORDER — TETRACAINE HCL 0.5 % OP SOLN
2.0000 [drp] | Freq: Once | OPHTHALMIC | Status: AC
  Administered 2016-02-02: 2 [drp] via OPHTHALMIC
  Filled 2016-02-02: qty 2

## 2016-02-02 MED ORDER — POLYMYXIN B-TRIMETHOPRIM 10000-0.1 UNIT/ML-% OP SOLN: 2.0000 [drp] | Freq: Four times a day (QID) | OPHTHALMIC | Status: AC

## 2016-02-02 MED ORDER — KETOROLAC TROMETHAMINE 0.5 % OP SOLN: 1.0000 [drp] | Freq: Four times a day (QID) | OPHTHALMIC | Status: DC

## 2016-02-02 NOTE — ED Notes (Signed)
Pt in via triage; pt reports getting something in his right eye this morning, pt states he has tried rinsing out eye but with no relief.  Pt reports pain, itching, no swelling noted at this time.

## 2016-02-02 NOTE — Discharge Instructions (Signed)
How to Use Eye Drops and Eye Ointments  HOW TO APPLY EYE DROPS  Follow these steps when applying eye drops:  1. Wash your hands.  2. Tilt your head back.  3. Put a finger under your eye and use it to gently pull your lower lid downward. Keep that finger in place.  4. Using your other hand, hold the dropper between your thumb and index finger.  5. Position the dropper just over the edge of the lower lid. Hold it as close to your eye as you can without touching the dropper to your eye.  6. Steady your hand. One way to do this is to lean your index finger against your brow.  7. Look up.  8. Slowly and gently squeeze one drop of medicine into your eye.  9. Close your eye.  10. Place a finger between your lower eyelid and your nose. Press gently for 2 minutes. This increases the amount of time that the medicine is exposed to the eye. It also reduces side effects that can develop if the drop gets into the bloodstream through the nose.  HOW TO APPLY EYE OINTMENTS  Follow these steps when applying eye ointments:  1. Wash your hands.  2. Put a finger under your eye and use it to gently pull your lower lid downward. Keep that finger in place.  3. Using your other hand, place the tip of the tube between your thumb and index finger with the remaining fingers braced against your cheek or nose.  4. Hold the tube just over the edge of your lower lid without touching the tube to your lid or eyeball.  5. Look up.  6. Line the inner part of your lower lid with ointment.  7. Gently pull up on your upper lid and look down. This will force the ointment to spread over the surface of the eye.  8. Release the upper lid.  9. If you can, close your eyes for 1-2 minutes.  Do not rub your eyes. If you applied the ointment correctly, your vision will be blurry for a few minutes. This is normal.  ADDITIONAL INFORMATION   Make sure to use the eye drops or ointment as told by your health care provider.   If you have been told to use both eye  drops and an eye ointment, apply the eye drops first, then wait 3-4 minutes before you apply the ointment.   Try not to touch the tip of the dropper or tube to your eye. A dropper or tube that has touched the eye can become contaminated.     This information is not intended to replace advice given to you by your health care provider. Make sure you discuss any questions you have with your health care provider.     Document Released: 10/11/2000 Document Revised: 11/19/2014 Document Reviewed: 07/01/2014  Elsevier Interactive Patient Education 2016 [employer]

## 2016-02-02 NOTE — ED Provider Notes (Signed)
[facility] Emergency Department Provider Note  ____________________________________________  Time seen: Approximately 6:38 PM  I have reviewed the triage vital signs and the nursing notes.   HISTORY  Chief Complaint Conjunctivitis    HPI Bobby Krueger is a 82 y.o. male who presents emergency department complaining of possible conjunctivitis or foreign body to his right eye. Patient states that his eyes but itching and oozing "pus" all day today. Patient states that "something may have gotten in there I'm not sure." Patient denies any visual changes, headache, nasal congestion,sore throat, fevers or chills. Patient does not wear glasses or contacts.   Past Medical History  Diagnosis Date  . COPD (chronic obstructive pulmonary disease) (HCC)   . Hypertension   . High cholesterol   . CHF (congestive heart failure) (HCC)   . Diabetes mellitus without complication (HCC)   . A-fib [facility])     Patient Active Problem List   Diagnosis Date Noted  . Generalized abdominal pain   . Atrial fibrillation (HCC) 11/19/2015  . AKI (acute kidney injury) (HCC) 11/16/2015  . Dehydration 11/16/2015  . Leukocytosis 11/16/2015  . CAD (coronary artery disease) 11/16/2015  . COPD (chronic obstructive pulmonary disease) (HCC) 11/16/2015  . GERD (gastroesophageal reflux disease) 11/16/2015  . Dyspnea 11/16/2015    Past Surgical History  Procedure Laterality Date  . Carpal tunnel release    . Cholecystectomy    . Back surgery      Current Outpatient Rx  Name  Route  Sig  Dispense  Refill  . albuterol (PROAIR HFA) 108 (90 Base) MCG/ACT inhaler   Inhalation   Inhale 2 puffs into the lungs every 4 (four) hours as needed for wheezing or shortness of breath.          Marland Kitchen apixaban (ELIQUIS) 5 MG TABS tablet   Oral   Take 1 tablet (5 mg total) by mouth 2 (two) times daily.   60 tablet   0   . brimonidine (ALPHAGAN) 0.2 % ophthalmic solution   Both Eyes   Place 1  drop into both eyes daily.          . carvedilol (COREG) 12.5 MG tablet   Oral   Take 12.5 mg by mouth 2 (two) times daily.         . clopidogrel (PLAVIX) 75 MG tablet   Oral   Take 75 mg by mouth daily.         . Fluticasone-Salmeterol (ADVAIR DISKUS) 250-50 MCG/DOSE AEPB   Inhalation   Inhale 1 puff into the lungs daily as needed (for wheezing or shortness).          . furosemide (LASIX) 40 MG tablet   Oral   Take 40 mg by mouth daily as needed for fluid or edema.          . gabapentin (NEURONTIN) 300 MG capsule   Oral   Take 300 mg by mouth at bedtime.         . insulin glargine (LANTUS) 100 UNIT/ML injection   Subcutaneous   Inject 0.31 mLs (31 Units total) into the skin at bedtime. Patient taking differently: Inject 62 Units into the skin at bedtime.    10 mL   0   . ipratropium-albuterol (DUONEB) 0.5-2.5 (3) MG/3ML SOLN   Nebulization   Take 3 mLs by nebulization every 6 (six) hours as needed (for SOB).         Marland Kitchen ketorolac (ACULAR) 0.5 % ophthalmic solution  Right Eye   Place 1 drop into the right eye 4 (four) times daily.   5 mL   0   . latanoprost (XALATAN) 0.005 % ophthalmic solution   Both Eyes   Place 1 drop into both eyes at bedtime. Reported on 12/21/2015         . losartan (COZAAR) 25 MG tablet   Oral   Take 1 tablet (25 mg total) by mouth daily.   30 tablet   0   . meclizine (ANTIVERT) 25 MG tablet   Oral   Take 1 tablet (25 mg total) by mouth 3 (three) times daily as needed for dizziness.   15 tablet   0   . nateglinide (STARLIX) 120 MG tablet   Oral   Take 120 mg by mouth daily.         . Omega-3 1000 MG CAPS   Oral   Take 1 g by mouth daily.         Marland Kitchen omeprazole (PRILOSEC) 20 MG capsule   Oral   Take 20 mg by mouth 2 (two) times daily.         . ondansetron (ZOFRAN ODT) 4 MG disintegrating tablet   Oral   Take 1 tablet (4 mg total) by mouth every 8 (eight) hours as needed for nausea.   6 tablet   0   .  polyvinyl alcohol (LIQUIFILM TEARS) 1.4 % ophthalmic solution   Both Eyes   Place 1 drop into both eyes as needed for dry eyes. Reported on 12/21/2015         . potassium chloride SA (K-DUR,KLOR-CON) 20 MEQ tablet   Oral   Take 20 mEq by mouth daily.         . promethazine (PHENERGAN) 25 MG tablet   Oral   Take 12.5-25 mg by mouth every 4 (four) hours as needed.         . simvastatin (ZOCOR) 40 MG tablet   Oral   Take 40 mg by mouth every evening.         Marland Kitchen SPIRIVA HANDIHALER 18 MCG inhalation capsule   Inhalation   Place 1 application into inhaler and inhale daily.           Dispense as written.   . terazosin (HYTRIN) 5 MG capsule   Oral   Take 5 mg by mouth every evening.         . triamcinolone cream (KENALOG) 0.1 %   Topical   Apply 1 application topically 2 (two) times daily as needed (for irritation).         . trimethoprim-polymyxin b (POLYTRIM) ophthalmic solution   Right Eye   Place 2 drops into the right eye every 6 (six) hours.   10 mL   0   . zolpidem (AMBIEN) 10 MG tablet   Oral   Take 10 mg by mouth at bedtime as needed for sleep.            Allergies Aldactone; Aspirin; Ciprofloxacin; Cyclobenzaprine; and Lexapro  Family History  Problem Relation Age of Onset  . Prostate cancer      Social History Social History  Substance Use Topics  . Smoking status: Former Games developer  . Smokeless tobacco: None  . Alcohol Use: No     Review of Systems  Constitutional: No fever/chills Eyes: No visual changes. Positive for right-sided conjunctival irritation, erythema, purulent drainage. ENT: No upper respiratory complaints. Cardiovascular: no chest pain. Respiratory: no cough. No SOB.  Musculoskeletal: Negative for musculoskeletal pain. Skin: Negative for rash, abrasions, lacerations, ecchymosis. Neurological: Negative for headaches, focal weakness or numbness. 10-point ROS otherwise  negative.  ____________________________________________   PHYSICAL EXAM:  VITAL SIGNS: ED Triage Vitals  Enc Vitals Group     BP 02/02/16 1824 176/87 mmHg     Pulse Rate 02/02/16 1824 86     Resp 02/02/16 1824 18     Temp 02/02/16 1824 97.9 F (36.6 C)     Temp Source 02/02/16 1824 Oral     SpO2 02/02/16 1824 95 %     Weight 02/02/16 1824 210 lb (95.255 kg)     Height 02/02/16 1824 5\' 6"  (1.676 m)     Head Cir --      Peak Flow --      Pain Score 02/02/16 1824 10     Pain Loc --      Pain Edu? --      Excl. in GC? --      Constitutional: Alert and oriented. Well appearing and in no acute distress. Eyes: On right side is erythematous. Purulent drainage is noted on the lower eyelashes. Funduscopic exam reveals good red reflex bilaterally, vascular and optic disc unremarkable.Marland Kitchen PERRL. EOMI. examination with fluorescein staining reveals no areas of uptake. Head: Atraumatic. ENT:      Ears:       Nose: No congestion/rhinnorhea.      Mouth/Throat: Mucous membranes are moist.  Neck: No stridor.   Cardiovascular: Normal rate, regular rhythm. Normal S1 and S2.  Good peripheral circulation. Respiratory: Normal respiratory effort without tachypnea or retractions. Lungs CTAB. Good air entry to the bases with no decreased or absent breath sounds. Musculoskeletal: Full range of motion to all extremities. No gross deformities appreciated. Neurologic:  Normal speech and language. No gross focal neurologic deficits are appreciated.  Skin:  Skin is warm, dry and intact. No rash noted. Psychiatric: Mood and affect are normal. Speech and behavior are normal. Patient exhibits appropriate insight and judgement.   ____________________________________________   LABS (all labs ordered are listed, but only abnormal results are displayed)  Labs Reviewed - No data to  display ____________________________________________  EKG   ____________________________________________  RADIOLOGY   No results found.  ____________________________________________    PROCEDURES  Procedure(s) performed:       Medications  fluorescein ophthalmic strip 1 strip (1 strip Right Eye Given by Other 02/02/16 1842)  tetracaine (PONTOCAINE) 0.5 % ophthalmic solution 2 drop (2 drops Right Eye Given by Other 02/02/16 1842)     ____________________________________________   INITIAL IMPRESSION / ASSESSMENT AND PLAN / ED COURSE  Pertinent labs & imaging results that were available during my care of the patient were reviewed by me and considered in my medical decision making (see chart for details).  Patient's diagnosis is consistent with Bacterial conjunctivitis to the right eye. No signs of foreign body or corneal abrasion.. Patient will be discharged home with prescriptions for antibiotic eyedrops and ocular for symptom relief. Patient is to follow up with primary care provider as needed or otherwise directed. Patient is given ED precautions to return to the ED for any worsening or new symptoms.     ____________________________________________  FINAL CLINICAL IMPRESSION(S) / ED DIAGNOSES  Final diagnoses:  Bacterial conjunctivitis of right eye      NEW MEDICATIONS STARTED DURING THIS VISIT:  New Prescriptions   KETOROLAC (ACULAR) 0.5 % OPHTHALMIC SOLUTION    Place 1 drop into the right eye 4 (four) times daily.  TRIMETHOPRIM-POLYMYXIN B (POLYTRIM) OPHTHALMIC SOLUTION    Place 2 drops into the right eye every 6 (six) hours.        This chart was dictated using voice recognition software/Dragon. Despite best efforts to proofread, errors can occur which can change the meaning. Any change was purely unintentional.    Racheal Patches, PA-C 02/02/16 1906  Governor Rooks, MD 02/02/16 2059

## 2016-02-02 NOTE — ED Notes (Signed)
Arrives with right eye redness, states itching

## 2016-02-03 ENCOUNTER — Emergency Department
Admission: EM | Admit: 2016-02-03 | Discharge: 2016-02-03 | Disposition: A | Discharge status: Home / Self Care | Payer: Medicare Other | Attending: Emergency Medicine | Admitting: Emergency Medicine

## 2016-02-03 DIAGNOSIS — I110 Hypertensive heart disease with heart failure: Secondary | ICD-10-CM | POA: Diagnosis not present

## 2016-02-03 DIAGNOSIS — H18821 Corneal disorder due to contact lens, right eye: Secondary | ICD-10-CM | POA: Insufficient documentation

## 2016-02-03 DIAGNOSIS — Z87891 Personal history of nicotine dependence: Secondary | ICD-10-CM | POA: Insufficient documentation

## 2016-02-03 DIAGNOSIS — E119 Type 2 diabetes mellitus without complications: Secondary | ICD-10-CM | POA: Insufficient documentation

## 2016-02-03 DIAGNOSIS — S0501XA Injury of conjunctiva and corneal abrasion without foreign body, right eye, initial encounter: Secondary | ICD-10-CM

## 2016-02-03 DIAGNOSIS — J449 Chronic obstructive pulmonary disease, unspecified: Secondary | ICD-10-CM | POA: Diagnosis not present

## 2016-02-03 DIAGNOSIS — H109 Unspecified conjunctivitis: Secondary | ICD-10-CM

## 2016-02-03 DIAGNOSIS — I509 Heart failure, unspecified: Secondary | ICD-10-CM | POA: Insufficient documentation

## 2016-02-03 DIAGNOSIS — Z7951 Long term (current) use of inhaled steroids: Secondary | ICD-10-CM | POA: Insufficient documentation

## 2016-02-03 DIAGNOSIS — Z79899 Other long term (current) drug therapy: Secondary | ICD-10-CM | POA: Insufficient documentation

## 2016-02-03 DIAGNOSIS — Z794 Long term (current) use of insulin: Secondary | ICD-10-CM | POA: Insufficient documentation

## 2016-02-03 DIAGNOSIS — H5711 Ocular pain, right eye: Secondary | ICD-10-CM | POA: Diagnosis present

## 2016-02-03 MED ORDER — FLUORESCEIN SODIUM 1 MG OP STRP
1.0000 | ORAL_STRIP | Freq: Once | OPHTHALMIC | Status: AC
  Administered 2016-02-03: 1 via OPHTHALMIC

## 2016-02-03 MED ORDER — FLUORESCEIN SODIUM 1 MG OP STRP
ORAL_STRIP | OPHTHALMIC | Status: AC
  Administered 2016-02-03: 1 via OPHTHALMIC
  Filled 2016-02-03: qty 1

## 2016-02-03 MED ORDER — TETRACAINE HCL 0.5 % OP SOLN
1.0000 [drp] | Freq: Once | OPHTHALMIC | Status: AC
  Administered 2016-02-03: 1 [drp] via OPHTHALMIC

## 2016-02-03 MED ORDER — TETRACAINE HCL 0.5 % OP SOLN
OPHTHALMIC | Status: AC
  Administered 2016-02-03: 1 [drp] via OPHTHALMIC
  Filled 2016-02-03: qty 2

## 2016-02-03 NOTE — Discharge Instructions (Signed)

## 2016-02-03 NOTE — ED Provider Notes (Signed)
[facility] Emergency Department Provider Note   ____________________________________________  Time seen: Approximately 3:15 AM  I have reviewed the triage vital signs and the nursing notes.   HISTORY  Chief Complaint Eye Pain   HPI Bobby Krueger is a 82 y.o. male who comes into the hospital today with eye pain. The patient was seen earlier today with eye redness and pain. The patient was having sensation that he had something in his eye. The patient was diagnosed with conjunctivitis after an evaluation and discharged home with some medication for his eye. The patient's son reports that he was doing better but then started feeling as though something was in his eye. The patient started having some more by pain which she rated at 10 out of 10 in intensity. The patient did pick up his medications but has not used them since he was discharged from the hospital. The patient and his son did have plans to follow-up with his eye doctor but they were concerned because his pain returned so they decided to come back to the hospital. The patient does have a history of bilateral cataract surgery with the right eye being 3 months ago and the left eye being approximately one month ago. He is here for evaluation.The patient has redness pain and draining to his right eye.   Past Medical History  Diagnosis Date  . COPD (chronic obstructive pulmonary disease) (HCC)   . Hypertension   . High cholesterol   . CHF (congestive heart failure) (HCC)   . Diabetes mellitus without complication (HCC)   . A-fib [facility])     Patient Active Problem List   Diagnosis Date Noted  . Generalized abdominal pain   . Atrial fibrillation (HCC) 11/19/2015  . AKI (acute kidney injury) (HCC) 11/16/2015  . Dehydration 11/16/2015  . Leukocytosis 11/16/2015  . CAD (coronary artery disease) 11/16/2015  . COPD (chronic obstructive pulmonary disease) (HCC) 11/16/2015  . GERD (gastroesophageal reflux  disease) 11/16/2015  . Dyspnea 11/16/2015    Past Surgical History  Procedure Laterality Date  . Carpal tunnel release    . Cholecystectomy    . Back surgery      Current Outpatient Rx  Name  Route  Sig  Dispense  Refill  . albuterol (PROAIR HFA) 108 (90 Base) MCG/ACT inhaler   Inhalation   Inhale 2 puffs into the lungs every 4 (four) hours as needed for wheezing or shortness of breath.          Marland Kitchen apixaban (ELIQUIS) 5 MG TABS tablet   Oral   Take 1 tablet (5 mg total) by mouth 2 (two) times daily.   60 tablet   0   . brimonidine (ALPHAGAN) 0.2 % ophthalmic solution   Both Eyes   Place 1 drop into both eyes daily.          . carvedilol (COREG) 12.5 MG tablet   Oral   Take 12.5 mg by mouth 2 (two) times daily.         . clopidogrel (PLAVIX) 75 MG tablet   Oral   Take 75 mg by mouth daily.         . Fluticasone-Salmeterol (ADVAIR DISKUS) 250-50 MCG/DOSE AEPB   Inhalation   Inhale 1 puff into the lungs daily as needed (for wheezing or shortness).          . furosemide (LASIX) 40 MG tablet   Oral   Take 40 mg by mouth daily as needed for fluid  or edema.          . gabapentin (NEURONTIN) 300 MG capsule   Oral   Take 300 mg by mouth at bedtime.         . insulin glargine (LANTUS) 100 UNIT/ML injection   Subcutaneous   Inject 0.31 mLs (31 Units total) into the skin at bedtime. Patient taking differently: Inject 62 Units into the skin at bedtime.    10 mL   0   . ipratropium-albuterol (DUONEB) 0.5-2.5 (3) MG/3ML SOLN   Nebulization   Take 3 mLs by nebulization every 6 (six) hours as needed (for SOB).         Marland Kitchen ketorolac (ACULAR) 0.5 % ophthalmic solution   Right Eye   Place 1 drop into the right eye 4 (four) times daily.   5 mL   0   . latanoprost (XALATAN) 0.005 % ophthalmic solution   Both Eyes   Place 1 drop into both eyes at bedtime. Reported on 12/21/2015         . losartan (COZAAR) 25 MG tablet   Oral   Take 1 tablet (25 mg total) by  mouth daily.   30 tablet   0   . meclizine (ANTIVERT) 25 MG tablet   Oral   Take 1 tablet (25 mg total) by mouth 3 (three) times daily as needed for dizziness.   15 tablet   0   . nateglinide (STARLIX) 120 MG tablet   Oral   Take 120 mg by mouth daily.         . Omega-3 1000 MG CAPS   Oral   Take 1 g by mouth daily.         Marland Kitchen omeprazole (PRILOSEC) 20 MG capsule   Oral   Take 20 mg by mouth 2 (two) times daily.         . ondansetron (ZOFRAN ODT) 4 MG disintegrating tablet   Oral   Take 1 tablet (4 mg total) by mouth every 8 (eight) hours as needed for nausea.   6 tablet   0   . polyvinyl alcohol (LIQUIFILM TEARS) 1.4 % ophthalmic solution   Both Eyes   Place 1 drop into both eyes as needed for dry eyes. Reported on 12/21/2015         . potassium chloride SA (K-DUR,KLOR-CON) 20 MEQ tablet   Oral   Take 20 mEq by mouth daily.         . promethazine (PHENERGAN) 25 MG tablet   Oral   Take 12.5-25 mg by mouth every 4 (four) hours as needed.         . simvastatin (ZOCOR) 40 MG tablet   Oral   Take 40 mg by mouth every evening.         Marland Kitchen SPIRIVA HANDIHALER 18 MCG inhalation capsule   Inhalation   Place 1 application into inhaler and inhale daily.           Dispense as written.   . terazosin (HYTRIN) 5 MG capsule   Oral   Take 5 mg by mouth every evening.         . triamcinolone cream (KENALOG) 0.1 %   Topical   Apply 1 application topically 2 (two) times daily as needed (for irritation).         . trimethoprim-polymyxin b (POLYTRIM) ophthalmic solution   Right Eye   Place 2 drops into the right eye every 6 (six) hours.   10 mL  0   . zolpidem (AMBIEN) 10 MG tablet   Oral   Take 10 mg by mouth at bedtime as needed for sleep.            Allergies Aldactone; Aspirin; Ciprofloxacin; Cyclobenzaprine; and Lexapro  Family History  Problem Relation Age of Onset  . Prostate cancer      Social History Social History  Substance Use  Topics  . Smoking status: Former Games developer  . Smokeless tobacco: Not on file  . Alcohol Use: No    Review of Systems Constitutional: No fever/chills Eyes: Eye redness and discharge ENT: No sore throat. Cardiovascular: Denies chest pain. Respiratory: Denies shortness of breath. Gastrointestinal: No abdominal pain.  No nausea, no vomiting.  No diarrhea.  No constipation. Genitourinary: Negative for dysuria. Musculoskeletal: Negative for back pain. Skin: Negative for rash. Neurological: Negative for headaches, focal weakness or numbness.  10-point ROS otherwise negative.  ____________________________________________   PHYSICAL EXAM:  VITAL SIGNS: ED Triage Vitals  Enc Vitals Group     BP 02/03/16 0252 157/92 mmHg     Pulse Rate 02/03/16 0252 73     Resp 02/03/16 0252 20     Temp 02/03/16 0252 98 F (36.7 C)     Temp Source 02/03/16 0252 Oral     SpO2 02/03/16 0252 96 %     Weight 02/03/16 0252 210 lb (95.255 kg)     Height 02/03/16 0252 5\' 6"  (1.676 m)     Head Cir --      Peak Flow --      Pain Score 02/03/16 0254 10     Pain Loc --      Pain Edu? --      Excl. in GC? --     Constitutional: Alert and oriented. Well appearing and in no moderate distress. Eyes: Conjunctivae injected, sclera injected. PERRL. EOMI. fluorescein uptake mid right eye, intraocular pressures to the right eye 16 and 18 respectively Head: Atraumatic. Nose: No congestion/rhinnorhea. Mouth/Throat: Mucous membranes are moist.   Cardiovascular: Normal rate, regular rhythm. Grossly normal heart sounds.  Good peripheral circulation. Respiratory: Normal respiratory effort.  No retractions. Lungs CTAB. Gastrointestinal: Soft and nontender. No distention. Musculoskeletal: No lower extremity tenderness nor edema.   Neurologic:  Normal speech and language. Skin:  Skin is warm, dry and intact.  Psychiatric: Mood and affect are normal.   ____________________________________________   LABS (all labs  ordered are listed, but only abnormal results are displayed)  Labs Reviewed - No data to display ____________________________________________  EKG  none ____________________________________________  RADIOLOGY  none ____________________________________________   PROCEDURES  Procedure(s) performed: None  Procedures  Critical Care performed: No  ____________________________________________   INITIAL IMPRESSION / ASSESSMENT AND PLAN / ED COURSE  Pertinent labs & imaging results that were available during my care of the patient were reviewed by me and considered in my medical decision making (see chart for details).  This is an 82 year old male who comes into the hospital today with some eye pain after being diagnosed with conjunctivitis. I did perform a secondary floor seen and examined it appears as though he has some uptake to his mid eye with a concern for a corneal abrasion. The patient's son reports that the patient has been constantly itching and rubbing his eye. The patient has not taken his medication so I did administer his eye medication for him. I encouraged his son though to take him in to be followed up with the eye doctor. [facility] I also will give him the follow-up for Dr. Lindon Romp if he is unable to get in quickly. The patient's visual acuity is 20/70 in the right eye and 20/50 in the left eye. He will be discharged home. ____________________________________________   FINAL CLINICAL IMPRESSION(S) / ED DIAGNOSES  Final diagnoses:  Corneal abrasion, right, initial encounter  Conjunctivitis of right eye      NEW MEDICATIONS STARTED DURING THIS VISIT:  Discharge Medication List as of 02/03/2016  3:35 AM       Note:  This document was prepared using Dragon voice recognition software and may include unintentional dictation errors.    Rebecka Apley, MD 02/03/16 [phone]

## 2016-02-03 NOTE — ED Notes (Signed)
Discharge instructions reviewed with patient. Patient verbalized understanding. Patient taken to lobby via wheelchair by son

## 2016-02-03 NOTE — ED Notes (Signed)
Pt to triage via w/c, rubbing at right eye persistently; pt brought in by son who reports that he was here earlier last night and dx with viral conjunctivitis; st c/o persistent pain and irritation with sensation of foreign body in eye

## 2016-02-05 ENCOUNTER — Emergency Department
Admission: EM | Admit: 2016-02-05 | Discharge: 2016-02-05 | Disposition: A | Discharge status: Home / Self Care | Payer: Medicare Other | Attending: Emergency Medicine | Admitting: Emergency Medicine

## 2016-02-05 ENCOUNTER — Encounter: Payer: Self-pay | Admitting: *Deleted

## 2016-02-05 DIAGNOSIS — E119 Type 2 diabetes mellitus without complications: Secondary | ICD-10-CM | POA: Insufficient documentation

## 2016-02-05 DIAGNOSIS — Z7951 Long term (current) use of inhaled steroids: Secondary | ICD-10-CM | POA: Diagnosis not present

## 2016-02-05 DIAGNOSIS — Z79899 Other long term (current) drug therapy: Secondary | ICD-10-CM | POA: Diagnosis not present

## 2016-02-05 DIAGNOSIS — Z7984 Long term (current) use of oral hypoglycemic drugs: Secondary | ICD-10-CM | POA: Insufficient documentation

## 2016-02-05 DIAGNOSIS — Z87891 Personal history of nicotine dependence: Secondary | ICD-10-CM | POA: Insufficient documentation

## 2016-02-05 DIAGNOSIS — I2510 Atherosclerotic heart disease of native coronary artery without angina pectoris: Secondary | ICD-10-CM | POA: Diagnosis not present

## 2016-02-05 DIAGNOSIS — H1033 Unspecified acute conjunctivitis, bilateral: Secondary | ICD-10-CM | POA: Insufficient documentation

## 2016-02-05 DIAGNOSIS — I110 Hypertensive heart disease with heart failure: Secondary | ICD-10-CM | POA: Insufficient documentation

## 2016-02-05 DIAGNOSIS — Z794 Long term (current) use of insulin: Secondary | ICD-10-CM | POA: Diagnosis not present

## 2016-02-05 DIAGNOSIS — J449 Chronic obstructive pulmonary disease, unspecified: Secondary | ICD-10-CM | POA: Diagnosis not present

## 2016-02-05 DIAGNOSIS — I509 Heart failure, unspecified: Secondary | ICD-10-CM | POA: Insufficient documentation

## 2016-02-05 DIAGNOSIS — R110 Nausea: Secondary | ICD-10-CM | POA: Diagnosis present

## 2016-02-05 DIAGNOSIS — H109 Unspecified conjunctivitis: Secondary | ICD-10-CM

## 2016-02-05 LAB — CBC
HEMATOCRIT: 39.9 % — AB (ref 40.0–52.0)
Hemoglobin: 13.8 g/dL (ref 13.0–18.0)
MCH: 30.9 pg (ref 26.0–34.0)
MCHC: 34.7 g/dL (ref 32.0–36.0)
MCV: 89.1 fL (ref 80.0–100.0)
Platelets: 160 10*3/uL (ref 150–440)
RBC: 4.48 MIL/uL (ref 4.40–5.90)
RDW: 13.1 % (ref 11.5–14.5)
WBC: 7.1 10*3/uL (ref 3.8–10.6)

## 2016-02-05 LAB — URINALYSIS COMPLETE WITH MICROSCOPIC (ARMC ONLY)
BACTERIA UA: NONE SEEN
Bilirubin Urine: NEGATIVE
Glucose, UA: NEGATIVE mg/dL
LEUKOCYTES UA: NEGATIVE
NITRITE: NEGATIVE
PROTEIN: 100 mg/dL — AB
SPECIFIC GRAVITY, URINE: 1.015 (ref 1.005–1.030)
Squamous Epithelial / LPF: NONE SEEN
pH: 6 (ref 5.0–8.0)

## 2016-02-05 LAB — COMPREHENSIVE METABOLIC PANEL
ALBUMIN: 3.8 g/dL (ref 3.5–5.0)
ALT: 12 U/L — ABNORMAL LOW (ref 17–63)
ANION GAP: 7 (ref 5–15)
AST: 20 U/L (ref 15–41)
Alkaline Phosphatase: 89 U/L (ref 38–126)
BUN: 10 mg/dL (ref 6–20)
CHLORIDE: 103 mmol/L (ref 101–111)
CO2: 28 mmol/L (ref 22–32)
Calcium: 9.4 mg/dL (ref 8.9–10.3)
Creatinine, Ser: 0.78 mg/dL (ref 0.61–1.24)
GFR calc Af Amer: >60 mL/min (ref >60)
GFR calc non Af Amer: >60 mL/min (ref >60)
GLUCOSE: 95 mg/dL (ref 65–99)
POTASSIUM: 3.9 mmol/L (ref 3.5–5.1)
SODIUM: 138 mmol/L (ref 135–145)
Total Bilirubin: 0.7 mg/dL (ref 0.3–1.2)
Total Protein: 7.3 g/dL (ref 6.5–8.1)

## 2016-02-05 LAB — TROPONIN I
TROPONIN I: 0.04 ng/mL — AB (ref <0.03)
Troponin I: 0.04 ng/mL (ref <0.03)

## 2016-02-05 LAB — LIPASE, BLOOD: Lipase: 13 U/L (ref 11–51)

## 2016-02-05 MED ORDER — FLUORESCEIN SODIUM 1 MG OP STRP
ORAL_STRIP | OPHTHALMIC | Status: AC
  Filled 2016-02-05: qty 1

## 2016-02-05 NOTE — ED Notes (Addendum)
Pt to ED from home via EMS with on going chronic nausea x past few weeks. Pt been seen multiple times here, the [place] and [facility] for same. Pt prescribed phenergan which has been helping. Today pt forgot to take phenergan, causing an increase in the nausea. Pt given 4 mg zofran en route, with relief. Pt currently being treated for conjunctivitis to right eye. Vitals stable at this time.

## 2016-02-05 NOTE — ED Provider Notes (Signed)
[facility] Emergency Department Provider Note   ____________________________________________  Time seen: Approximately 5:13 PM  I have reviewed the triage vital signs and the nursing notes.   HISTORY  Chief Complaint Nausea    HPI Bobby Krueger is a 82 y.o. male who came in because of nausea. He apparently forgot to take his Phenergan today and got nauseated. EMS reports they gave him some Zofran and his nausea is gone and he feels better. He has previously been seen repeatedly for nausea here in the last 3 months he's had a head CT belly CT and a chest CT all of which did not show any acute pathology patient is not nauseated now.   Past Medical History  Diagnosis Date  . COPD (chronic obstructive pulmonary disease) (HCC)   . Hypertension   . High cholesterol   . CHF (congestive heart failure) (HCC)   . Diabetes mellitus without complication (HCC)   . A-fib [facility])     Patient Active Problem List   Diagnosis Date Noted  . Generalized abdominal pain   . Atrial fibrillation (HCC) 11/19/2015  . AKI (acute kidney injury) (HCC) 11/16/2015  . Dehydration 11/16/2015  . Leukocytosis 11/16/2015  . CAD (coronary artery disease) 11/16/2015  . COPD (chronic obstructive pulmonary disease) (HCC) 11/16/2015  . GERD (gastroesophageal reflux disease) 11/16/2015  . Dyspnea 11/16/2015    Past Surgical History  Procedure Laterality Date  . Carpal tunnel release    . Cholecystectomy    . Back surgery      Current Outpatient Rx  Name  Route  Sig  Dispense  Refill  . albuterol (PROAIR HFA) 108 (90 Base) MCG/ACT inhaler   Inhalation   Inhale 2 puffs into the lungs every 4 (four) hours as needed for wheezing or shortness of breath.          Marland Kitchen apixaban (ELIQUIS) 5 MG TABS tablet   Oral   Take 1 tablet (5 mg total) by mouth 2 (two) times daily.   60 tablet   0   . brimonidine (ALPHAGAN) 0.2 % ophthalmic solution   Both Eyes   Place 1 drop into both  eyes daily.          . carvedilol (COREG) 12.5 MG tablet   Oral   Take 12.5 mg by mouth 2 (two) times daily.         . clopidogrel (PLAVIX) 75 MG tablet   Oral   Take 75 mg by mouth daily.         . Fluticasone-Salmeterol (ADVAIR DISKUS) 250-50 MCG/DOSE AEPB   Inhalation   Inhale 1 puff into the lungs daily as needed (for wheezing or shortness).          . furosemide (LASIX) 40 MG tablet   Oral   Take 40 mg by mouth daily as needed for fluid or edema.          . gabapentin (NEURONTIN) 300 MG capsule   Oral   Take 300 mg by mouth at bedtime.         . insulin glargine (LANTUS) 100 UNIT/ML injection   Subcutaneous   Inject 0.31 mLs (31 Units total) into the skin at bedtime. Patient taking differently: Inject 62 Units into the skin at bedtime.    10 mL   0   . ipratropium-albuterol (DUONEB) 0.5-2.5 (3) MG/3ML SOLN   Nebulization   Take 3 mLs by nebulization every 6 (six) hours as needed (for SOB).         Marland Kitchen  ketorolac (ACULAR) 0.5 % ophthalmic solution   Right Eye   Place 1 drop into the right eye 4 (four) times daily.   5 mL   0   . latanoprost (XALATAN) 0.005 % ophthalmic solution   Both Eyes   Place 1 drop into both eyes at bedtime. Reported on 12/21/2015         . losartan (COZAAR) 25 MG tablet   Oral   Take 1 tablet (25 mg total) by mouth daily.   30 tablet   0   . meclizine (ANTIVERT) 25 MG tablet   Oral   Take 1 tablet (25 mg total) by mouth 3 (three) times daily as needed for dizziness.   15 tablet   0   . nateglinide (STARLIX) 120 MG tablet   Oral   Take 120 mg by mouth daily.         . Omega-3 1000 MG CAPS   Oral   Take 1 g by mouth daily.         Marland Kitchen omeprazole (PRILOSEC) 20 MG capsule   Oral   Take 20 mg by mouth 2 (two) times daily.         . ondansetron (ZOFRAN ODT) 4 MG disintegrating tablet   Oral   Take 1 tablet (4 mg total) by mouth every 8 (eight) hours as needed for nausea.   6 tablet   0   . polyvinyl alcohol  (LIQUIFILM TEARS) 1.4 % ophthalmic solution   Both Eyes   Place 1 drop into both eyes as needed for dry eyes. Reported on 12/21/2015         . potassium chloride SA (K-DUR,KLOR-CON) 20 MEQ tablet   Oral   Take 20 mEq by mouth daily.         . promethazine (PHENERGAN) 25 MG tablet   Oral   Take 12.5-25 mg by mouth every 4 (four) hours as needed.         . simvastatin (ZOCOR) 40 MG tablet   Oral   Take 40 mg by mouth every evening.         Marland Kitchen SPIRIVA HANDIHALER 18 MCG inhalation capsule   Inhalation   Place 1 application into inhaler and inhale daily.           Dispense as written.   . terazosin (HYTRIN) 5 MG capsule   Oral   Take 5 mg by mouth every evening.         . triamcinolone cream (KENALOG) 0.1 %   Topical   Apply 1 application topically 2 (two) times daily as needed (for irritation).         . trimethoprim-polymyxin b (POLYTRIM) ophthalmic solution   Right Eye   Place 2 drops into the right eye every 6 (six) hours.   10 mL   0   . zolpidem (AMBIEN) 10 MG tablet   Oral   Take 10 mg by mouth at bedtime as needed for sleep.            Allergies Aldactone; Aspirin; Ciprofloxacin; Cyclobenzaprine; and Lexapro  Family History  Problem Relation Age of Onset  . Prostate cancer      Social History Social History  Substance Use Topics  . Smoking status: Former Games developer  . Smokeless tobacco: None  . Alcohol Use: No    Review of Systems Constitutional: No fever/chills Eyes: No visual changes But redness and tearing bilaterally. ENT: No sore throat. Cardiovascular: Denies chest pain. Respiratory: Denies shortness  of breath. Gastrointestinal: No abdominal pain.   nausea, no vomiting.  No diarrhea.  No constipation. Genitourinary: Negative for dysuria. Musculoskeletal: Negative for back pain. Skin: Negative for rash. Neurological: Negative for headaches, focal weakness or numbness.  10-point ROS otherwise  negative.  ____________________________________________   PHYSICAL EXAM:  VITAL SIGNS: ED Triage Vitals  Enc Vitals Group     BP 02/05/16 1514 158/82 mmHg     Pulse Rate 02/05/16 1514 87     Resp 02/05/16 1514 18     Temp 02/05/16 1516 98.6 F (37 C)     Temp Source 02/05/16 1514 Oral     SpO2 02/05/16 1514 95 %     Weight 02/05/16 1514 210 lb (95.255 kg)     Height 02/05/16 1514  (1.676 m)     Head Cir --      Peak Flow --      Pain Score 02/05/16 1515 0     Pain Loc --      Pain Edu? --      Excl. in GC? --     Constitutional: Alert and oriented. Well appearing and in no acute distress. Eyes: Conjunctivae are normal. PERRL. EOMI. Head: Atraumatic.Eyes pupils equal round reactive extraocular movements intact. Injected bilaterally source seen is negative in the right eye which is the eye that it came into first and is worse in the left eye is mildly red and not draining much Nose: No congestion/rhinnorhea. Mouth/Throat: Mucous membranes are moist.  Oropharynx non-erythematous. Neck: No stridor.  Cardiovascular: Normal rate, regular rhythm. Grossly normal heart sounds.  Good peripheral circulation. Respiratory: Normal respiratory effort.  No retractions. Lungs CTAB. Gastrointestinal: Soft and nontender. No distention. No abdominal bruits. No CVA tenderness. Musculoskeletal: No lower extremity tenderness nor edema.  No joint effusions. Neurologic:  Normal speech and language. No gross focal neurologic deficits are appreciated. No gait instability. Skin:  Skin is warm, dry and intact. No rash noted. Psychiatric: Mood and affect are normal. Speech and behavior are normal.  ____________________________________________   LABS (all labs ordered are listed, but only abnormal results are displayed)  Labs Reviewed  COMPREHENSIVE METABOLIC PANEL - Abnormal; Notable for the following:    ALT 12 (*)    All other components within normal limits  CBC - Abnormal; Notable for  the following:    HCT 39.9 (*)    All other components within normal limits  TROPONIN I - Abnormal; Notable for the following:    Troponin I 0.04 (*)    All other components within normal limits  URINALYSIS COMPLETEWITH MICROSCOPIC ([facility] ONLY) - Abnormal; Notable for the following:    Color, Urine YELLOW (*)    APPearance CLEAR (*)    Ketones, ur 1+ (*)    Hgb urine dipstick 2+ (*)    Protein, ur 100 (*)    All other components within normal limits  TROPONIN I - Abnormal; Notable for the following:    Troponin I 0.04 (*)    All other components within normal limits  LIPASE, BLOOD   ____________________________________________  EKG  ____________________________________________  RADIOLOGY  ____________________________________________   PROCEDURES    Procedures  ____________________________________________   INITIAL IMPRESSION / ASSESSMENT AND PLAN / ED COURSE  Pertinent labs & imaging results that were available during my care of the patient were reviewed by me and considered in my medical decision making (see chart for details).  Fluoroscopy seen of the right is negative ____________________________________________   FINAL CLINICAL IMPRESSION(S) /  ED DIAGNOSES  Final diagnoses:  Nausea  Bilateral conjunctivitis      NEW MEDICATIONS STARTED DURING THIS VISIT:  New Prescriptions   No medications on file     Note:  This document was prepared using Dragon voice recognition software and may include unintentional dictation errors.    Arnaldo Natal, MD 02/05/16 2157

## 2016-02-05 NOTE — ED Notes (Signed)
Pt son contacted on the phone who is en route to pick pt up at this time

## 2016-02-06 ENCOUNTER — Emergency Department
Admission: EM | Admit: 2016-02-06 | Discharge: 2016-02-06 | Disposition: A | Discharge status: Home / Self Care | Payer: Medicare Other | Attending: Emergency Medicine | Admitting: Emergency Medicine

## 2016-02-06 ENCOUNTER — Encounter: Payer: Self-pay | Admitting: Urgent Care

## 2016-02-06 DIAGNOSIS — Z79899 Other long term (current) drug therapy: Secondary | ICD-10-CM | POA: Insufficient documentation

## 2016-02-06 DIAGNOSIS — I4891 Unspecified atrial fibrillation: Secondary | ICD-10-CM | POA: Diagnosis not present

## 2016-02-06 DIAGNOSIS — Z87891 Personal history of nicotine dependence: Secondary | ICD-10-CM | POA: Insufficient documentation

## 2016-02-06 DIAGNOSIS — J449 Chronic obstructive pulmonary disease, unspecified: Secondary | ICD-10-CM | POA: Diagnosis not present

## 2016-02-06 DIAGNOSIS — E119 Type 2 diabetes mellitus without complications: Secondary | ICD-10-CM | POA: Diagnosis not present

## 2016-02-06 DIAGNOSIS — I110 Hypertensive heart disease with heart failure: Secondary | ICD-10-CM | POA: Diagnosis not present

## 2016-02-06 DIAGNOSIS — Z7951 Long term (current) use of inhaled steroids: Secondary | ICD-10-CM | POA: Insufficient documentation

## 2016-02-06 DIAGNOSIS — Z794 Long term (current) use of insulin: Secondary | ICD-10-CM | POA: Insufficient documentation

## 2016-02-06 DIAGNOSIS — I2510 Atherosclerotic heart disease of native coronary artery without angina pectoris: Secondary | ICD-10-CM | POA: Insufficient documentation

## 2016-02-06 DIAGNOSIS — I509 Heart failure, unspecified: Secondary | ICD-10-CM | POA: Diagnosis not present

## 2016-02-06 DIAGNOSIS — Z791 Long term (current) use of non-steroidal anti-inflammatories (NSAID): Secondary | ICD-10-CM | POA: Insufficient documentation

## 2016-02-06 DIAGNOSIS — R350 Frequency of micturition: Secondary | ICD-10-CM | POA: Diagnosis not present

## 2016-02-06 NOTE — Discharge Instructions (Signed)
Urinary Frequency °The number of times a normal person urinates depends upon how much liquid they take in and how much liquid they are losing. If the temperature is hot and there is high humidity, then the person will sweat more and usually breathe a little more frequently. These factors decrease the amount of frequency of urination that would be considered normal. °The amount you drink is easily determined, but the amount of fluid lost is sometimes more difficult to calculate.  °Fluid is lost in two ways: °· Sensible fluid loss is usually measured by the amount of urine that you get rid of. Losses of fluid can also occur with diarrhea. °· Insensible fluid loss is more difficult to measure. It is caused by evaporation. Insensible loss of fluid occurs through breathing and sweating. It usually ranges from a little less than a quart to a little more than a quart of fluid a day. °In normal temperatures and activity levels, the average person may urinate 4 to 7 times in a 24-hour period. Needing to urinate more often than that could indicate a problem. If one urinates 4 to 7 times in 24 hours and has large volumes each time, that could indicate a different problem from one who urinates 4 to 7 times a day and has small volumes. The time of urinating is also important. Most urinating should be done during the waking hours. Getting up at night to urinate frequently can indicate some problems. °CAUSES  °The bladder is the organ in your lower abdomen that holds urine. Like a balloon, it swells some as it fills up. Your nerves sense this and tell you it is time to head for the bathroom. There are a number of reasons that you might feel the need to urinate more often than usual. They include: °· Urinary tract infection. This is usually associated with other signs such as burning when you urinate. °· In men, problems with the prostate (a walnut-size gland that is located near the tube that carries urine out of your body). There  are two reasons why the prostate can cause an increased frequency of urination: °¨ An enlarged prostate that does not let the bladder empty well. If the bladder only half empties when you urinate, then it only has half the capacity to fill before you have to urinate again. °¨ The nerves in the bladder become more hypersensitive with an increased size of the prostate even if the bladder empties completely. °· Pregnancy. °· Obesity. Excess weight is more likely to cause a problem for women than for men. °· Bladder stones or other bladder problems. °· Caffeine. °· Alcohol. °· Medications. For example, drugs that help the body get rid of extra fluid (diuretics) increase urine production. Some other medicines must be taken with lots of fluids. °· Muscle or nerve weakness. This might be the result of a spinal cord injury, a stroke, multiple sclerosis, or Parkinson disease. °· Long-standing diabetes can decrease the sensation of the bladder. This loss of sensation makes it harder to sense the bladder needs to be emptied. Over a period of years, the bladder is stretched out by constant overfilling. This weakens the bladder muscles so that the bladder does not empty well and has less capacity to fill with new urine. °· Interstitial cystitis (also called painful bladder syndrome). This condition develops because the tissues that line the inside of the bladder are inflamed (inflammation is the body's way of reacting to injury or infection). It causes pain and frequent   urination. It occurs in women more often than in men. °DIAGNOSIS  °· To decide what might be causing your urinary frequency, your health care provider will probably: °¨ Ask about symptoms you have noticed. °¨ Ask about your overall health. This will include questions about any medications you are taking. °¨ Do a physical examination. °· Order some tests. These might include: °¨ A blood test to check for diabetes or other health issues that could be contributing  to the problem. °¨ Urine testing. This could measure the flow of urine and the pressure on the bladder. °¨ A test of your neurological system (the brain, spinal cord, and nerves). This is the system that senses the need to urinate. °¨ A bladder test to check whether it is emptying completely when you urinate. °¨ Cystoscopy. This test uses a thin tube with a tiny camera on it. It offers a look inside your urethra and bladder to see if there are problems. °¨ Imaging tests. You might be given a contrast dye and then asked to urinate. X-rays are taken to see how your bladder is working. °TREATMENT  °It is important for you to be evaluated to determine if the amount or frequency that you have is unusual or abnormal. If it is found to be abnormal, the cause should be determined and this can usually be found out easily. Depending upon the cause, treatment could include medication, stimulation of the nerves, or surgery. °There are not too many things that you can do as an individual to change your urinary frequency. It is important that you balance the amount of fluid intake needed to compensate for your activity and the temperature. Medical problems will be diagnosed and taken care of by your physician. There is no particular bladder training such as Kegel exercises that you can do to help urinary frequency. This is an exercise that is usually recommended for people who have leaking of urine when they laugh, cough, or sneeze. °HOME CARE INSTRUCTIONS  °· Take any medications your health care provider prescribed or suggested. Follow the directions carefully. °· Practice any lifestyle changes that are recommended. These might include: °¨ Drinking less fluid or drinking at different times of the day. If you need to urinate often during the night, for example, you may need to stop drinking fluids early in the evening. °¨ Cutting down on caffeine or alcohol. They both can make you need to urinate more often than normal. Caffeine  is found in coffee, tea, and sodas. °¨ Losing weight, if that is recommended. °· Keep a journal or a log. You might be asked to record how much you drink and when and where you feel the need to urinate. This will also help evaluate how well the treatment provided by your physician is working. °SEEK MEDICAL CARE IF:  °· Your need to urinate often gets worse. °· You feel increased pain or irritation when you urinate. °· You notice blood in your urine. °· You have questions about any medications that your health care provider recommended. °· You notice blood, pus, or swelling at the site of any test or treatment procedure. °· You develop a fever of more than 100.5°F (38.1°C). °SEEK IMMEDIATE MEDICAL CARE IF:  °You develop a fever of more than 102.0°F (38.9°C). °  °This information is not intended to replace advice given to you by your health care provider. Make sure you discuss any questions you have with your health care provider. °  °Document Released: 05/01/2009 Document Revised:   07/26/2014 Document Reviewed: 05/01/2009 °Elsevier Interactive Patient Education ©2016 [employer] ° °

## 2016-02-06 NOTE — ED Notes (Signed)
Patient presents to the ED tonight with reports of inability to sleep secondary to him experiencing micturitional frequency. Patient denies hematuria or dysuria. Reports that he was "just here" last night for uncontrolled nausea; denies nausea at present.

## 2016-02-06 NOTE — ED Provider Notes (Signed)
[facility] Emergency Department Provider Note  ____________________________________________  Time seen: 5:45 AM  I have reviewed the triage vital signs and the nursing notes.   HISTORY  Chief Complaint Urinary Frequency      HPI Bobby Krueger is a 82 y.o. male seen in the emergency department last night for nausea. Patient now returns to the emergency department with frequent urination. Patient states that he is urinating every 30 minutes. Patient states the last time he urinated was an hour and half ago. Patient denies any dysuria no hematuria no fever no flank pain or back pain. In addition patient is also taking Lasix and Terazosine     Past Medical History  Diagnosis Date  . COPD (chronic obstructive pulmonary disease) (HCC)   . Hypertension   . High cholesterol   . CHF (congestive heart failure) (HCC)   . Diabetes mellitus without complication (HCC)   . A-fib [facility])     Patient Active Problem List   Diagnosis Date Noted  . Generalized abdominal pain   . Atrial fibrillation (HCC) 11/19/2015  . AKI (acute kidney injury) (HCC) 11/16/2015  . Dehydration 11/16/2015  . Leukocytosis 11/16/2015  . CAD (coronary artery disease) 11/16/2015  . COPD (chronic obstructive pulmonary disease) (HCC) 11/16/2015  . GERD (gastroesophageal reflux disease) 11/16/2015  . Dyspnea 11/16/2015    Past Surgical History  Procedure Laterality Date  . Carpal tunnel release    . Cholecystectomy    . Back surgery      Current Outpatient Rx  Name  Route  Sig  Dispense  Refill  . albuterol (PROAIR HFA) 108 (90 Base) MCG/ACT inhaler   Inhalation   Inhale 2 puffs into the lungs every 4 (four) hours as needed for wheezing or shortness of breath.          Marland Kitchen apixaban (ELIQUIS) 5 MG TABS tablet   Oral   Take 1 tablet (5 mg total) by mouth 2 (two) times daily.   60 tablet   0   . brimonidine (ALPHAGAN) 0.2 % ophthalmic solution   Both Eyes   Place 1 drop into  both eyes daily.          . carvedilol (COREG) 12.5 MG tablet   Oral   Take 12.5 mg by mouth 2 (two) times daily.         . clopidogrel (PLAVIX) 75 MG tablet   Oral   Take 75 mg by mouth daily.         . Fluticasone-Salmeterol (ADVAIR DISKUS) 250-50 MCG/DOSE AEPB   Inhalation   Inhale 1 puff into the lungs daily as needed (for wheezing or shortness).          . furosemide (LASIX) 40 MG tablet   Oral   Take 40 mg by mouth daily as needed for fluid or edema.          . gabapentin (NEURONTIN) 300 MG capsule   Oral   Take 300 mg by mouth at bedtime.         . insulin glargine (LANTUS) 100 UNIT/ML injection   Subcutaneous   Inject 0.31 mLs (31 Units total) into the skin at bedtime. Patient taking differently: Inject 62 Units into the skin at bedtime.    10 mL   0   . ipratropium-albuterol (DUONEB) 0.5-2.5 (3) MG/3ML SOLN   Nebulization   Take 3 mLs by nebulization every 6 (six) hours as needed (for SOB).         Marland Kitchen  ketorolac (ACULAR) 0.5 % ophthalmic solution   Right Eye   Place 1 drop into the right eye 4 (four) times daily.   5 mL   0   . latanoprost (XALATAN) 0.005 % ophthalmic solution   Both Eyes   Place 1 drop into both eyes at bedtime. Reported on 12/21/2015         . losartan (COZAAR) 25 MG tablet   Oral   Take 1 tablet (25 mg total) by mouth daily.   30 tablet   0   . meclizine (ANTIVERT) 25 MG tablet   Oral   Take 1 tablet (25 mg total) by mouth 3 (three) times daily as needed for dizziness.   15 tablet   0   . nateglinide (STARLIX) 120 MG tablet   Oral   Take 120 mg by mouth daily.         . Omega-3 1000 MG CAPS   Oral   Take 1 g by mouth daily.         Marland Kitchen omeprazole (PRILOSEC) 20 MG capsule   Oral   Take 20 mg by mouth 2 (two) times daily.         . ondansetron (ZOFRAN ODT) 4 MG disintegrating tablet   Oral   Take 1 tablet (4 mg total) by mouth every 8 (eight) hours as needed for nausea.   6 tablet   0   . polyvinyl  alcohol (LIQUIFILM TEARS) 1.4 % ophthalmic solution   Both Eyes   Place 1 drop into both eyes as needed for dry eyes. Reported on 12/21/2015         . potassium chloride SA (K-DUR,KLOR-CON) 20 MEQ tablet   Oral   Take 20 mEq by mouth daily.         . promethazine (PHENERGAN) 25 MG tablet   Oral   Take 12.5-25 mg by mouth every 4 (four) hours as needed.         . simvastatin (ZOCOR) 40 MG tablet   Oral   Take 40 mg by mouth every evening.         Marland Kitchen SPIRIVA HANDIHALER 18 MCG inhalation capsule   Inhalation   Place 1 application into inhaler and inhale daily.           Dispense as written.   . terazosin (HYTRIN) 5 MG capsule   Oral   Take 5 mg by mouth every evening.         . triamcinolone cream (KENALOG) 0.1 %   Topical   Apply 1 application topically 2 (two) times daily as needed (for irritation).         . trimethoprim-polymyxin b (POLYTRIM) ophthalmic solution   Right Eye   Place 2 drops into the right eye every 6 (six) hours.   10 mL   0   . zolpidem (AMBIEN) 10 MG tablet   Oral   Take 10 mg by mouth at bedtime as needed for sleep.            Allergies Aldactone; Aspirin; Ciprofloxacin; Cyclobenzaprine; and Lexapro  Family History  Problem Relation Age of Onset  . Prostate cancer      Social History Social History  Substance Use Topics  . Smoking status: Former Games developer  . Smokeless tobacco: None  . Alcohol Use: No    Review of Systems  Constitutional: Negative for fever. Eyes: Negative for visual changes. ENT: Negative for sore throat. Cardiovascular: Negative for chest pain. Respiratory: Negative for  shortness of breath. Gastrointestinal: Negative for abdominal pain, vomiting and diarrhea. Genitourinary: Negative for dysuria.Positive for urinary frequency Musculoskeletal: Negative for back pain. Skin: Negative for rash. Neurological: Negative for headaches, focal weakness or numbness.   10-point ROS otherwise  negative.  ____________________________________________   PHYSICAL EXAM:  VITAL SIGNS: ED Triage Vitals  Enc Vitals Group     BP 02/06/16 0518 149/94 mmHg     Pulse Rate 02/06/16 0518 84     Resp 02/06/16 0518 18     Temp 02/06/16 0518 97.8 F (36.6 C)     Temp Source 02/06/16 0518 Oral     SpO2 02/06/16 0518 95 %     Weight 02/06/16 0518 210 lb (95.255 kg)     Height --      Head Cir --      Peak Flow --      Pain Score 02/06/16 0519 0     Pain Loc --      Pain Edu? --      Excl. in GC? --      Constitutional: Alert and oriented. Well appearing and in no distress. Eyes: Conjunctivae are normal. PERRL. Normal extraocular movements. ENT   Head: Normocephalic and atraumatic.   Nose: No congestion/rhinnorhea.   Mouth/Throat: Mucous membranes are moist.   Neck: No stridor. Hematological/Lymphatic/Immunilogical: No cervical lymphadenopathy. Cardiovascular: Normal rate, regular rhythm. Normal and symmetric distal pulses are present in all extremities. No murmurs, rubs, or gallops. Respiratory: Normal respiratory effort without tachypnea nor retractions. Breath sounds are clear and equal bilaterally. No wheezes/rales/rhonchi. Gastrointestinal: Soft and nontender. No distention. There is no CVA tenderness. Genitourinary: deferred Musculoskeletal: Nontender with normal range of motion in all extremities. No joint effusions.  No lower extremity tenderness nor edema. Neurologic:  Normal speech and language. No gross focal neurologic deficits are appreciated. Speech is normal.  Skin:  Skin is warm, dry and intact. No rash noted. Psychiatric: Mood and affect are normal. Speech and behavior are normal. Patient exhibits appropriate insight and judgment.  ____________________________________________    LABS (pertinent positives/negatives)  Urinalysis which occurred yesterday negative     Procedures      INITIAL IMPRESSION / ASSESSMENT AND PLAN / ED  COURSE  Pertinent labs & imaging results that were available during my care of the patient were reviewed by me and considered in my medical decision making (see chart for details).  Patient's urinary frequency most likely secondary to benign prostatic hyperplasia for which she is prescribed her Zosyn  ____________________________________________   FINAL CLINICAL IMPRESSION(S) / ED DIAGNOSES  Final diagnoses:  Urinary frequency      Darci Current, MD 02/06/16 [phone]

## 2016-02-06 NOTE — ED Notes (Addendum)
Pt feels like he is peeing every 30 minutes or so at home and cannot get any rest.  Pt states that he is not urinating a lot at one time and feels like it's just dribbling.  Pt states that he thinks he has had prostate issues in the past, but isn't sure what exactly.

## 2016-02-07 ENCOUNTER — Emergency Department
Admission: EM | Admit: 2016-02-07 | Discharge: 2016-02-07 | Disposition: A | Discharge status: Home / Self Care | Payer: Non-veteran care | Source: Home / Self Care | Attending: Emergency Medicine | Admitting: Emergency Medicine

## 2016-02-07 ENCOUNTER — Encounter: Payer: Self-pay | Admitting: Emergency Medicine

## 2016-02-07 ENCOUNTER — Emergency Department: Payer: Non-veteran care

## 2016-02-07 DIAGNOSIS — Z794 Long term (current) use of insulin: Secondary | ICD-10-CM

## 2016-02-07 DIAGNOSIS — J449 Chronic obstructive pulmonary disease, unspecified: Secondary | ICD-10-CM | POA: Insufficient documentation

## 2016-02-07 DIAGNOSIS — I110 Hypertensive heart disease with heart failure: Secondary | ICD-10-CM

## 2016-02-07 DIAGNOSIS — I509 Heart failure, unspecified: Secondary | ICD-10-CM | POA: Insufficient documentation

## 2016-02-07 DIAGNOSIS — R001 Bradycardia, unspecified: Secondary | ICD-10-CM | POA: Diagnosis not present

## 2016-02-07 DIAGNOSIS — R300 Dysuria: Secondary | ICD-10-CM | POA: Insufficient documentation

## 2016-02-07 DIAGNOSIS — R110 Nausea: Secondary | ICD-10-CM

## 2016-02-07 DIAGNOSIS — Z79899 Other long term (current) drug therapy: Secondary | ICD-10-CM | POA: Insufficient documentation

## 2016-02-07 DIAGNOSIS — Z7902 Long term (current) use of antithrombotics/antiplatelets: Secondary | ICD-10-CM | POA: Insufficient documentation

## 2016-02-07 DIAGNOSIS — Z87891 Personal history of nicotine dependence: Secondary | ICD-10-CM

## 2016-02-07 DIAGNOSIS — E119 Type 2 diabetes mellitus without complications: Secondary | ICD-10-CM | POA: Insufficient documentation

## 2016-02-07 DIAGNOSIS — R531 Weakness: Secondary | ICD-10-CM

## 2016-02-07 DIAGNOSIS — I2510 Atherosclerotic heart disease of native coronary artery without angina pectoris: Secondary | ICD-10-CM | POA: Insufficient documentation

## 2016-02-07 DIAGNOSIS — M6281 Muscle weakness (generalized): Secondary | ICD-10-CM

## 2016-02-07 DIAGNOSIS — E11649 Type 2 diabetes mellitus with hypoglycemia without coma: Secondary | ICD-10-CM | POA: Diagnosis not present

## 2016-02-07 DIAGNOSIS — R1084 Generalized abdominal pain: Secondary | ICD-10-CM

## 2016-02-07 DIAGNOSIS — Z7951 Long term (current) use of inhaled steroids: Secondary | ICD-10-CM

## 2016-02-07 LAB — LIPASE, BLOOD: LIPASE: 14 U/L (ref 11–51)

## 2016-02-07 LAB — COMPREHENSIVE METABOLIC PANEL
ALK PHOS: 90 U/L (ref 38–126)
ALT: 12 U/L — ABNORMAL LOW (ref 17–63)
AST: 19 U/L (ref 15–41)
Albumin: 3.9 g/dL (ref 3.5–5.0)
Anion gap: 6 (ref 5–15)
BILIRUBIN TOTAL: 0.8 mg/dL (ref 0.3–1.2)
BUN: 13 mg/dL (ref 6–20)
CALCIUM: 9.4 mg/dL (ref 8.9–10.3)
CO2: 29 mmol/L (ref 22–32)
CREATININE: 1.08 mg/dL (ref 0.61–1.24)
Chloride: 105 mmol/L (ref 101–111)
GFR calc Af Amer: >60 mL/min (ref >60)
GFR, EST NON AFRICAN AMERICAN: 60 mL/min — AB (ref >60)
Glucose, Bld: 128 mg/dL — ABNORMAL HIGH (ref 65–99)
POTASSIUM: 3.8 mmol/L (ref 3.5–5.1)
Sodium: 140 mmol/L (ref 135–145)
TOTAL PROTEIN: 7.3 g/dL (ref 6.5–8.1)

## 2016-02-07 LAB — URINALYSIS COMPLETE WITH MICROSCOPIC (ARMC ONLY)
BACTERIA UA: NONE SEEN
BILIRUBIN URINE: NEGATIVE
Bacteria, UA: NONE SEEN
Bilirubin Urine: NEGATIVE
Glucose, UA: 50 mg/dL — AB
Glucose, UA: 50 mg/dL — AB
LEUKOCYTES UA: NEGATIVE
Leukocytes, UA: NEGATIVE
NITRITE: NEGATIVE
Nitrite: NEGATIVE
PH: 6 (ref 5.0–8.0)
PH: 5 (ref 5.0–8.0)
PROTEIN: 100 mg/dL — AB
Protein, ur: 100 mg/dL — AB
SPECIFIC GRAVITY, URINE: 1.018 (ref 1.005–1.030)
Specific Gravity, Urine: 1.019 (ref 1.005–1.030)

## 2016-02-07 LAB — CBC WITH DIFFERENTIAL/PLATELET
Basophils Absolute: 0.1 10*3/uL (ref 0–0.1)
Basophils Relative: 1 %
EOS PCT: 5 %
Eosinophils Absolute: 0.4 10*3/uL (ref 0–0.7)
HEMATOCRIT: 41.8 % (ref 40.0–52.0)
Hemoglobin: 14.5 g/dL (ref 13.0–18.0)
Lymphocytes Relative: 31 %
Lymphs Abs: 2.3 10*3/uL (ref 1.0–3.6)
MCH: 31.1 pg (ref 26.0–34.0)
MCHC: 34.7 g/dL (ref 32.0–36.0)
MCV: 89.7 fL (ref 80.0–100.0)
MONO ABS: 0.5 10*3/uL (ref 0.2–1.0)
MONOS PCT: 7 %
NEUTROS ABS: 4.1 10*3/uL (ref 1.4–6.5)
Neutrophils Relative %: 56 %
Platelets: 166 10*3/uL (ref 150–440)
RBC: 4.67 MIL/uL (ref 4.40–5.90)
RDW: 13.4 % (ref 11.5–14.5)
WBC: 7.4 10*3/uL (ref 3.8–10.6)

## 2016-02-07 LAB — TROPONIN I
Troponin I: 0.03 ng/mL (ref <0.03)
Troponin I: 0.04 ng/mL (ref <0.03)

## 2016-02-07 NOTE — Discharge Instructions (Signed)
Please seek medical attention for any high fevers, chest pain, shortness of breath, change in behavior, persistent vomiting, bloody stool or any other new or concerning symptoms. ° ° °Abdominal Pain, Adult °Many things can cause belly (abdominal) pain. Most times, the belly pain is not dangerous. Many cases of belly pain can be watched and treated at home. °HOME CARE  °· Do not take medicines that help you go poop (laxatives) unless told to by your doctor. °· Only take medicine as told by your doctor. °· Eat or drink as told by your doctor. Your doctor will tell you if you should be on a special diet. °GET HELP IF: °· You do not know what is causing your belly pain. °· You have belly pain while you are sick to your stomach (nauseous) or have runny poop (diarrhea). °· You have pain while you pee or poop. °· Your belly pain wakes you up at night. °· You have belly pain that gets worse or better when you eat. °· You have belly pain that gets worse when you eat fatty foods. °· You have a fever. °GET HELP RIGHT AWAY IF:  °· The pain does not go away within 2 hours. °· You keep throwing up (vomiting). °· The pain changes and is only in the right or left part of the belly. °· You have bloody or tarry looking poop. °MAKE SURE YOU:  °· Understand these instructions. °· Will watch your condition. °· Will get help right away if you are not doing well or get worse. °  °This information is not intended to replace advice given to you by your health care provider. Make sure you discuss any questions you have with your health care provider. °  °Document Released: 12/22/2007 Document Revised: 07/26/2014 Document Reviewed: 03/14/2013 °Elsevier Interactive Patient Education ©2016 [employer] ° °

## 2016-02-07 NOTE — ED Notes (Signed)
Spoke with pt son, he stated he would be here in approximately ten minutes to pick pt up.

## 2016-02-07 NOTE — ED Provider Notes (Signed)
[facility] Emergency Department Provider Note  ___________________________________________  Time seen: Approximately 2:56 AM  I have reviewed the triage vital signs and the nursing notes.   HISTORY  Chief Complaint Urinary Retention    HPI Bobby Krueger is a 82 y.o. male who is complaining of nausea, urinary frequency and inability to empty his bladder that has been going on pretty much all day. Patient states that he think he had a history where they had to put a Foley catheter in because he had urinary retention. He denies any other recent illnesses, cough or cold symptoms, fever or chills, or any other symptoms. Patient denies any significant pain at this time. Nothing seems to make the symptoms worse or better but he is very concerned because every time he goes to urinate and he only gets small amounts. Patient feels like his abdomen is now bloated.   Past Medical History  Diagnosis Date  . COPD (chronic obstructive pulmonary disease) (HCC)   . Hypertension   . High cholesterol   . CHF (congestive heart failure) (HCC)   . Diabetes mellitus without complication (HCC)   . A-fib [facility])     Patient Active Problem List   Diagnosis Date Noted  . Generalized abdominal pain   . Atrial fibrillation (HCC) 11/19/2015  . AKI (acute kidney injury) (HCC) 11/16/2015  . Dehydration 11/16/2015  . Leukocytosis 11/16/2015  . CAD (coronary artery disease) 11/16/2015  . COPD (chronic obstructive pulmonary disease) (HCC) 11/16/2015  . GERD (gastroesophageal reflux disease) 11/16/2015  . Dyspnea 11/16/2015    Past Surgical History  Procedure Laterality Date  . Carpal tunnel release    . Cholecystectomy    . Back surgery      Current Outpatient Rx  Name  Route  Sig  Dispense  Refill  . albuterol (PROAIR HFA) 108 (90 Base) MCG/ACT inhaler   Inhalation   Inhale 2 puffs into the lungs every 4 (four) hours as needed for wheezing or shortness of breath.            Marland Kitchen apixaban (ELIQUIS) 5 MG TABS tablet   Oral   Take 1 tablet (5 mg total) by mouth 2 (two) times daily.   60 tablet   0   . brimonidine (ALPHAGAN) 0.2 % ophthalmic solution   Both Eyes   Place 1 drop into both eyes daily.          . carvedilol (COREG) 12.5 MG tablet   Oral   Take 12.5 mg by mouth 2 (two) times daily.         . clopidogrel (PLAVIX) 75 MG tablet   Oral   Take 75 mg by mouth daily.         . Fluticasone-Salmeterol (ADVAIR DISKUS) 250-50 MCG/DOSE AEPB   Inhalation   Inhale 1 puff into the lungs daily as needed (for wheezing or shortness).          . furosemide (LASIX) 40 MG tablet   Oral   Take 40 mg by mouth daily as needed for fluid or edema.          . gabapentin (NEURONTIN) 300 MG capsule   Oral   Take 300 mg by mouth at bedtime.         . insulin glargine (LANTUS) 100 UNIT/ML injection   Subcutaneous   Inject 0.31 mLs (31 Units total) into the skin at bedtime. Patient taking differently: Inject 62 Units into the skin at bedtime.  10 mL   0   . ipratropium-albuterol (DUONEB) 0.5-2.5 (3) MG/3ML SOLN   Nebulization   Take 3 mLs by nebulization every 6 (six) hours as needed (for SOB).         Marland Kitchen ketorolac (ACULAR) 0.5 % ophthalmic solution   Right Eye   Place 1 drop into the right eye 4 (four) times daily.   5 mL   0   . latanoprost (XALATAN) 0.005 % ophthalmic solution   Both Eyes   Place 1 drop into both eyes at bedtime. Reported on 12/21/2015         . losartan (COZAAR) 25 MG tablet   Oral   Take 1 tablet (25 mg total) by mouth daily.   30 tablet   0   . meclizine (ANTIVERT) 25 MG tablet   Oral   Take 1 tablet (25 mg total) by mouth 3 (three) times daily as needed for dizziness.   15 tablet   0   . nateglinide (STARLIX) 120 MG tablet   Oral   Take 120 mg by mouth daily.         . Omega-3 1000 MG CAPS   Oral   Take 1 g by mouth daily.         Marland Kitchen omeprazole (PRILOSEC) 20 MG capsule   Oral   Take 20 mg  by mouth 2 (two) times daily.         . ondansetron (ZOFRAN ODT) 4 MG disintegrating tablet   Oral   Take 1 tablet (4 mg total) by mouth every 8 (eight) hours as needed for nausea.   6 tablet   0   . polyvinyl alcohol (LIQUIFILM TEARS) 1.4 % ophthalmic solution   Both Eyes   Place 1 drop into both eyes as needed for dry eyes. Reported on 12/21/2015         . potassium chloride SA (K-DUR,KLOR-CON) 20 MEQ tablet   Oral   Take 20 mEq by mouth daily.         . promethazine (PHENERGAN) 25 MG tablet   Oral   Take 12.5-25 mg by mouth every 4 (four) hours as needed.         . simvastatin (ZOCOR) 40 MG tablet   Oral   Take 40 mg by mouth every evening.         Marland Kitchen SPIRIVA HANDIHALER 18 MCG inhalation capsule   Inhalation   Place 1 application into inhaler and inhale daily.           Dispense as written.   . terazosin (HYTRIN) 5 MG capsule   Oral   Take 5 mg by mouth every evening.         . triamcinolone cream (KENALOG) 0.1 %   Topical   Apply 1 application topically 2 (two) times daily as needed (for irritation).         . trimethoprim-polymyxin b (POLYTRIM) ophthalmic solution   Right Eye   Place 2 drops into the right eye every 6 (six) hours.   10 mL   0   . zolpidem (AMBIEN) 10 MG tablet   Oral   Take 10 mg by mouth at bedtime as needed for sleep.            Allergies Aldactone; Aspirin; Ciprofloxacin; Cyclobenzaprine; and Lexapro  Family History  Problem Relation Age of Onset  . Prostate cancer      Social History Social History  Substance Use Topics  . Smoking status:  Former Smoker  . Smokeless tobacco: Not on file  . Alcohol Use: No    Review of Systems Constitutional: No fever/chills Eyes: No visual changes. ENT: No sore throat. Cardiovascular: Denies chest pain. Respiratory: Denies shortness of breath. Gastrointestinal: No abdominal pain.  Positive for nausea, no vomiting.  No diarrhea.  No constipation. Genitourinary: Negative  for dysuria.Positive for decreased urine output Musculoskeletal: Negative for back pain. Skin: Negative for rash. Neurological: Negative for headaches, focal weakness or numbness.  10-point ROS otherwise negative.  ____________________________________________   PHYSICAL EXAM:  VITAL SIGNS: ED Triage Vitals  Enc Vitals Group     BP 02/07/16 0058 174/81 mmHg     Pulse Rate 02/07/16 0058 95     Resp 02/07/16 0058 20     Temp 02/07/16 0058 98.3 F (36.8 C)     Temp Source 02/07/16 0058 Oral     SpO2 02/07/16 0058 95 %     Weight 02/07/16 0058 210 lb (95.255 kg)     Height 02/07/16 0058 5\' 6"  (1.676 m)     Head Cir --      Peak Flow --      Pain Score 02/07/16 0059 6     Pain Loc --      Pain Edu? --      Excl. in GC? --     Constitutional: Alert and oriented. Well appearing and in no acute distress.Patient is fairly comfortable in the ER. Eyes: Conjunctivae are normal. PERRL. EOMI. Head: Atraumatic. Nose: No congestion/rhinnorhea. Mouth/Throat: Mucous membranes are moist.  Oropharynx non-erythematous. Neck: No stridor.   Cardiovascular: Normal rate, irregular rhythm. Grossly normal heart sounds.  Good peripheral circulation. Respiratory: Normal respiratory effort.  No retractions. Lungs CTAB. Gastrointestinal: Soft and patient with some mild suprapubic tenderness but he does not appear to be significantly distended.. No distention. No abdominal bruits. No CVA tenderness. Musculoskeletal: No lower extremity tenderness nor edema.  No joint effusions. Neurologic:  Normal speech and language. No gross focal neurologic deficits are appreciated. No gait instability. Skin:  Skin is warm, dry and intact. No rash noted. Psychiatric: Mood and affect are normal. Speech and behavior are normal.  ____________________________________________   LABS (all labs ordered are listed, but only abnormal results are displayed)  Labs Reviewed  URINALYSIS COMPLETEWITH MICROSCOPIC ([facility] ONLY)  - Abnormal; Notable for the following:    Color, Urine YELLOW (*)    APPearance CLEAR (*)    Glucose, UA 50 (*)    Ketones, ur TRACE (*)    Hgb urine dipstick 2+ (*)    Protein, ur 100 (*)    Squamous Epithelial / LPF 0-5 (*)    All other components within normal limits  COMPREHENSIVE METABOLIC PANEL - Abnormal; Notable for the following:    Glucose, Bld 128 (*)    ALT 12 (*)    GFR calc non Af Amer 60 (*)    All other components within normal limits  TROPONIN I - Abnormal; Notable for the following:    Troponin I 0.03 (*)    All other components within normal limits  CBC WITH DIFFERENTIAL/PLATELET  LIPASE, BLOOD   ____________________________________________  EKG ED ECG REPORT I, Leona Carry, the attending physician, personally viewed and interpreted this ECG.   Date: 02/07/2016  EKG Time: 03 42  Rate: 87  Rhythm: LBBB  Axis: Left axis deviation  Intervals:left bundle branch block  ST&T Change: Nonspecific ST and T-wave changes   ____________________________________________  RADIOLOGY Dg Abd Acute  W/chest  02/07/2016  CLINICAL DATA:  Pain and nausea EXAM: DG ABDOMEN ACUTE W/ 1V CHEST COMPARISON:  01/01/2016 chest x-ray.  Abdominal CT 12/21/2015 FINDINGS: Chronic cardiomegaly and aortic tortuosity. There is no edema, consolidation, effusion, or pneumothorax. Calcified granuloma at the left base. Nonobstructive bowel gas pattern. No concern for pneumatosis or pneumoperitoneum. Vascular calcifications. No evidence of urolithiasis. Cholecystectomy changes. IMPRESSION: No acute findings in the chest or abdomen. Electronically Signed   By: Marnee Spring M.D.   On: 02/07/2016 03:46     ____________________________________________   PROCEDURES  Procedure(s) performed: None  Procedures  Critical Care performed: No  ____________________________________________   INITIAL IMPRESSION / ASSESSMENT AND PLAN / ED COURSE  Pertinent labs & imaging results that were  available during my care of the patient were reviewed by me and considered in my medical decision making (see chart for details). 5:31 AM We'll get routine labs and urinalysis along with a bladder scan at this time.  5:31 AM Bladder scan only had 200 mL of urine. Patient will also get routine labs, urinalysis, and x-rays of his chest and abdomen. We will also do a routine troponins as well.  5:31 AM Patient has been in the ER almost every day this week and we still don't know exactly why he is here tonight. Patient did not have urinary retention. Patient has been comfortable the entire time he has been in the emergency department. We decided because his initial troponin was 0.03 that were going to get a repeat troponin at 7:30 this morning and as long as his negative, patient will be sent back home to follow-up with his PMD. We generally feel this is patient lives alone that he is coming here just for company and attention. But we will continue to work him up to make sure there is no acute findings. ____________________________________________   FINAL CLINICAL IMPRESSION(S) / ED DIAGNOSES  Final diagnoses:  Generalized weakness  Dysuria      NEW MEDICATIONS STARTED DURING THIS VISIT:  New Prescriptions   No medications on file     Note:  This document was prepared using Dragon voice recognition software and may include unintentional dictation errors.    Leona Carry, MD 02/07/16 [phone]

## 2016-02-07 NOTE — ED Notes (Signed)
Reviewed d/c instructions, follow-up care with pt. Pt verbalized understanding 

## 2016-02-07 NOTE — ED Provider Notes (Signed)
[facility] Emergency Department Provider Note   ____________________________________________  Time seen: ~1835  I have reviewed the triage vital signs and the nursing notes.   HISTORY  Chief Complaint Nausea and Dysuria   History limited by: Not Limited   HPI Bobby Krueger is a 82 y.o. male who presents to the emergency department today with continued abdominal pain and weakness. The patient has been seen multiple times this week and was seen last month for the same complaint. Workup last night included abdominal x-rays and blood work without concerning findings. Patient states that he has not followed up with the [place] in [place]. Patient denies any new symptoms today. Denies any fevers.   Past Medical History  Diagnosis Date  . COPD (chronic obstructive pulmonary disease) (HCC)   . Hypertension   . High cholesterol   . CHF (congestive heart failure) (HCC)   . Diabetes mellitus without complication (HCC)   . A-fib [facility])     Patient Active Problem List   Diagnosis Date Noted  . Generalized abdominal pain   . Atrial fibrillation (HCC) 11/19/2015  . AKI (acute kidney injury) (HCC) 11/16/2015  . Dehydration 11/16/2015  . Leukocytosis 11/16/2015  . CAD (coronary artery disease) 11/16/2015  . COPD (chronic obstructive pulmonary disease) (HCC) 11/16/2015  . GERD (gastroesophageal reflux disease) 11/16/2015  . Dyspnea 11/16/2015    Past Surgical History  Procedure Laterality Date  . Carpal tunnel release    . Cholecystectomy    . Back surgery      Current Outpatient Rx  Name  Route  Sig  Dispense  Refill  . albuterol (PROAIR HFA) 108 (90 Base) MCG/ACT inhaler   Inhalation   Inhale 2 puffs into the lungs every 4 (four) hours as needed for wheezing or shortness of breath.          Marland Kitchen apixaban (ELIQUIS) 5 MG TABS tablet   Oral   Take 1 tablet (5 mg total) by mouth 2 (two) times daily.   60 tablet   0   . brimonidine (ALPHAGAN) 0.2 %  ophthalmic solution   Both Eyes   Place 1 drop into both eyes daily.          . carvedilol (COREG) 12.5 MG tablet   Oral   Take 12.5 mg by mouth 2 (two) times daily.         . clopidogrel (PLAVIX) 75 MG tablet   Oral   Take 75 mg by mouth daily.         . Fluticasone-Salmeterol (ADVAIR DISKUS) 250-50 MCG/DOSE AEPB   Inhalation   Inhale 1 puff into the lungs daily as needed (for wheezing or shortness).          . furosemide (LASIX) 40 MG tablet   Oral   Take 40 mg by mouth daily as needed for fluid or edema.          . gabapentin (NEURONTIN) 300 MG capsule   Oral   Take 300 mg by mouth at bedtime.         . insulin glargine (LANTUS) 100 UNIT/ML injection   Subcutaneous   Inject 0.31 mLs (31 Units total) into the skin at bedtime. Patient taking differently: Inject 62 Units into the skin at bedtime.    10 mL   0   . ipratropium-albuterol (DUONEB) 0.5-2.5 (3) MG/3ML SOLN   Nebulization   Take 3 mLs by nebulization every 6 (six) hours as needed (for SOB).         Marland Kitchen  ketorolac (ACULAR) 0.5 % ophthalmic solution   Right Eye   Place 1 drop into the right eye 4 (four) times daily.   5 mL   0   . latanoprost (XALATAN) 0.005 % ophthalmic solution   Both Eyes   Place 1 drop into both eyes at bedtime. Reported on 12/21/2015         . losartan (COZAAR) 25 MG tablet   Oral   Take 1 tablet (25 mg total) by mouth daily.   30 tablet   0   . meclizine (ANTIVERT) 25 MG tablet   Oral   Take 1 tablet (25 mg total) by mouth 3 (three) times daily as needed for dizziness.   15 tablet   0   . nateglinide (STARLIX) 120 MG tablet   Oral   Take 120 mg by mouth daily.         . Omega-3 1000 MG CAPS   Oral   Take 1 g by mouth daily.         Marland Kitchen omeprazole (PRILOSEC) 20 MG capsule   Oral   Take 20 mg by mouth 2 (two) times daily.         . ondansetron (ZOFRAN ODT) 4 MG disintegrating tablet   Oral   Take 1 tablet (4 mg total) by mouth every 8 (eight) hours as  needed for nausea.   6 tablet   0   . polyvinyl alcohol (LIQUIFILM TEARS) 1.4 % ophthalmic solution   Both Eyes   Place 1 drop into both eyes as needed for dry eyes. Reported on 12/21/2015         . potassium chloride SA (K-DUR,KLOR-CON) 20 MEQ tablet   Oral   Take 20 mEq by mouth daily.         . promethazine (PHENERGAN) 25 MG tablet   Oral   Take 12.5-25 mg by mouth every 4 (four) hours as needed.         . simvastatin (ZOCOR) 40 MG tablet   Oral   Take 40 mg by mouth every evening.         Marland Kitchen SPIRIVA HANDIHALER 18 MCG inhalation capsule   Inhalation   Place 1 application into inhaler and inhale daily.           Dispense as written.   . terazosin (HYTRIN) 5 MG capsule   Oral   Take 5 mg by mouth every evening.         . triamcinolone cream (KENALOG) 0.1 %   Topical   Apply 1 application topically 2 (two) times daily as needed (for irritation).         . trimethoprim-polymyxin b (POLYTRIM) ophthalmic solution   Right Eye   Place 2 drops into the right eye every 6 (six) hours.   10 mL   0   . zolpidem (AMBIEN) 10 MG tablet   Oral   Take 10 mg by mouth at bedtime as needed for sleep.            Allergies Aldactone; Aspirin; Ciprofloxacin; Cyclobenzaprine; and Lexapro  Family History  Problem Relation Age of Onset  . Prostate cancer      Social History Social History  Substance Use Topics  . Smoking status: Former Games developer  . Smokeless tobacco: None  . Alcohol Use: No    Review of Systems  Constitutional: Negative for fever. Cardiovascular: Negative for chest pain. Respiratory: Negative for shortness of breath. Gastrointestinal: Positive for abdominal pain Neurological: Negative  for headaches, focal weakness or numbness.  10-point ROS otherwise negative.  ____________________________________________   PHYSICAL EXAM:  VITAL SIGNS: ED Triage Vitals  Enc Vitals Group     BP 02/07/16 1613 133/85 mmHg     Pulse Rate 02/07/16 1613 84      Resp 02/07/16 1613 18     Temp 02/07/16 1613 98.3 F (36.8 C)     Temp Source 02/07/16 1613 Oral     SpO2 02/07/16 1613 93 %     Weight 02/07/16 1613 210 lb (95.255 kg)     Height 02/07/16 1613 5\' 6"  (1.676 m)     Head Cir --      Peak Flow --      Pain Score 02/07/16 1615 0   Constitutional: Alert and oriented. Well appearing and in no distress. Eyes: Conjunctivae are normal. PERRL. Normal extraocular movements. ENT   Head: Normocephalic and atraumatic.   Nose: No congestion/rhinnorhea.   Mouth/Throat: Mucous membranes are moist.   Neck: No stridor. Hematological/Lymphatic/Immunilogical: No cervical lymphadenopathy. Cardiovascular: Normal rate, regular rhythm.  No murmurs, rubs, or gallops. Respiratory: Normal respiratory effort without tachypnea nor retractions. Breath sounds are clear and equal bilaterally. No wheezes/rales/rhonchi. Gastrointestinal: Soft and nontender. No distention.  Genitourinary: Deferred Musculoskeletal: Normal range of motion in all extremities. No joint effusions.  No lower extremity tenderness nor edema. Neurologic:  Normal speech and language. No gross focal neurologic deficits are appreciated.  Skin:  Skin is warm, dry and intact. No rash noted. Psychiatric: Mood and affect are normal. Speech and behavior are normal. Patient exhibits appropriate insight and judgment.  ____________________________________________    LABS (pertinent positives/negatives)  Labs Reviewed  URINALYSIS COMPLETEWITH MICROSCOPIC ([facility] ONLY) - Abnormal; Notable for the following:    Color, Urine YELLOW (*)    APPearance CLEAR (*)    Glucose, UA 50 (*)    Ketones, ur TRACE (*)    Hgb urine dipstick 2+ (*)    Protein, ur 100 (*)    Squamous Epithelial / LPF 0-5 (*)    All other components within normal limits     ____________________________________________   EKG  None  ____________________________________________     RADIOLOGY  None  ____________________________________________   PROCEDURES  Procedure(s) performed: None  Critical Care performed: No  ____________________________________________   INITIAL IMPRESSION / ASSESSMENT AND PLAN / ED COURSE  Pertinent labs & imaging results that were available during my care of the patient were reviewed by me and considered in my medical decision making (see chart for details).  Patient presented to the emergency department today because of concerns for continued abdominal pain and dysuria. The patient was seen last night for the same complaint and is been seen multiple times this week in the emergency department. I did have a discussion with patient to try to find out if he had any specific concerns. He denied any specific concerns. He has also not followed up with his primary care doctor. I did discuss with the patient of this point here most likely be best served by following up with his primary care. He states he will call his primary care doctor's office on Monday morning.  ____________________________________________   FINAL CLINICAL IMPRESSION(S) / ED DIAGNOSES  Final diagnoses:  Generalized abdominal pain  Weakness     Note: This dictation was prepared with Dragon dictation. Any transcriptional errors that result from this process are unintentional    Phineas Semen, MD 02/07/16 [phone]

## 2016-02-07 NOTE — ED Notes (Signed)
Bladder Scan - 200mL 

## 2016-02-07 NOTE — ED Provider Notes (Signed)
-----------------------------------------   8:47 AM on 02/07/2016 -----------------------------------------   Blood pressure 145/71, pulse 73, temperature 98.3 F (36.8 C), temperature source Oral, resp. rate 16, height 5\' 6"  (1.676 m), weight 210 lb (95.255 kg), SpO2 95 %.  Assuming care from Dr. Ladona Ridgel.  In short, Bobby Krueger is a 82 y.o. male with a chief complaint of Urinary Retention .  Refer to the original H&P for additional details.  The current plan of care is to *following the second troponin. The patient's first troponin is 0.03. Nonsignificant increase up to 0.04. Rest on the patient's clinical note I felt this was unlikely to be acute ischemic chest pain. Patient will be discharged as scheduled.Jennye Moccasin, MD 02/07/16 [phone]

## 2016-02-07 NOTE — ED Notes (Signed)
EDP notified of elevated troponin.  

## 2016-02-07 NOTE — ED Notes (Signed)
Patient reports feeling the urge to urinate but only small amounts when he is able to go.

## 2016-02-07 NOTE — ED Notes (Signed)
C/O nausea and difficulty urinating.  Onset of symptoms yesterday.  Seen and treated in ED this morning for same, patient states symptoms area worse.  Last void one hour ago.

## 2016-02-09 ENCOUNTER — Encounter: Payer: Self-pay | Admitting: Emergency Medicine

## 2016-02-09 ENCOUNTER — Emergency Department: Payer: Non-veteran care

## 2016-02-09 ENCOUNTER — Inpatient Hospital Stay
Admission: EM | Admit: 2016-02-09 | Discharge: 2016-02-14 | DRG: 638 | Disposition: A | Discharge status: Skilled Nursing Facility | Payer: Non-veteran care | Attending: Internal Medicine | Admitting: Internal Medicine

## 2016-02-09 DIAGNOSIS — I509 Heart failure, unspecified: Secondary | ICD-10-CM | POA: Diagnosis present

## 2016-02-09 DIAGNOSIS — E162 Hypoglycemia, unspecified: Secondary | ICD-10-CM | POA: Diagnosis present

## 2016-02-09 DIAGNOSIS — Z9981 Dependence on supplemental oxygen: Secondary | ICD-10-CM

## 2016-02-09 DIAGNOSIS — I2510 Atherosclerotic heart disease of native coronary artery without angina pectoris: Secondary | ICD-10-CM | POA: Diagnosis present

## 2016-02-09 DIAGNOSIS — K5900 Constipation, unspecified: Secondary | ICD-10-CM | POA: Diagnosis present

## 2016-02-09 DIAGNOSIS — R531 Weakness: Secondary | ICD-10-CM

## 2016-02-09 DIAGNOSIS — Z87891 Personal history of nicotine dependence: Secondary | ICD-10-CM

## 2016-02-09 DIAGNOSIS — Z79899 Other long term (current) drug therapy: Secondary | ICD-10-CM

## 2016-02-09 DIAGNOSIS — Z888 Allergy status to other drugs, medicaments and biological substances status: Secondary | ICD-10-CM

## 2016-02-09 DIAGNOSIS — Z5320 Procedure and treatment not carried out because of patient's decision for unspecified reasons: Secondary | ICD-10-CM | POA: Diagnosis present

## 2016-02-09 DIAGNOSIS — I959 Hypotension, unspecified: Secondary | ICD-10-CM | POA: Diagnosis present

## 2016-02-09 DIAGNOSIS — R001 Bradycardia, unspecified: Secondary | ICD-10-CM | POA: Diagnosis present

## 2016-02-09 DIAGNOSIS — J441 Chronic obstructive pulmonary disease with (acute) exacerbation: Secondary | ICD-10-CM | POA: Diagnosis present

## 2016-02-09 DIAGNOSIS — Z9049 Acquired absence of other specified parts of digestive tract: Secondary | ICD-10-CM

## 2016-02-09 DIAGNOSIS — E7800 Pure hypercholesterolemia, unspecified: Secondary | ICD-10-CM | POA: Diagnosis present

## 2016-02-09 DIAGNOSIS — I110 Hypertensive heart disease with heart failure: Secondary | ICD-10-CM | POA: Diagnosis present

## 2016-02-09 DIAGNOSIS — E11649 Type 2 diabetes mellitus with hypoglycemia without coma: Principal | ICD-10-CM | POA: Diagnosis present

## 2016-02-09 DIAGNOSIS — I482 Chronic atrial fibrillation: Secondary | ICD-10-CM | POA: Diagnosis present

## 2016-02-09 DIAGNOSIS — Z7902 Long term (current) use of antithrombotics/antiplatelets: Secondary | ICD-10-CM

## 2016-02-09 DIAGNOSIS — Z7951 Long term (current) use of inhaled steroids: Secondary | ICD-10-CM

## 2016-02-09 DIAGNOSIS — R4182 Altered mental status, unspecified: Secondary | ICD-10-CM

## 2016-02-09 DIAGNOSIS — Z794 Long term (current) use of insulin: Secondary | ICD-10-CM

## 2016-02-09 DIAGNOSIS — Z7901 Long term (current) use of anticoagulants: Secondary | ICD-10-CM

## 2016-02-09 DIAGNOSIS — Z8042 Family history of malignant neoplasm of prostate: Secondary | ICD-10-CM

## 2016-02-09 DIAGNOSIS — Z9889 Other specified postprocedural states: Secondary | ICD-10-CM

## 2016-02-09 DIAGNOSIS — E785 Hyperlipidemia, unspecified: Secondary | ICD-10-CM | POA: Diagnosis present

## 2016-02-09 LAB — CBC
HEMATOCRIT: 34.5 % — AB (ref 40.0–52.0)
Hemoglobin: 12.1 g/dL — ABNORMAL LOW (ref 13.0–18.0)
MCH: 31.3 pg (ref 26.0–34.0)
MCHC: 35 g/dL (ref 32.0–36.0)
MCV: 89.4 fL (ref 80.0–100.0)
PLATELETS: 173 10*3/uL (ref 150–440)
RBC: 3.86 MIL/uL — ABNORMAL LOW (ref 4.40–5.90)
RDW: 13 % (ref 11.5–14.5)
WBC: 5.8 10*3/uL (ref 3.8–10.6)

## 2016-02-09 LAB — COMPREHENSIVE METABOLIC PANEL
ALBUMIN: 3.3 g/dL — AB (ref 3.5–5.0)
ALT: 13 U/L — ABNORMAL LOW (ref 17–63)
ANION GAP: 3 — AB (ref 5–15)
AST: 21 U/L (ref 15–41)
Alkaline Phosphatase: 67 U/L (ref 38–126)
BILIRUBIN TOTAL: 0.4 mg/dL (ref 0.3–1.2)
BUN: 10 mg/dL (ref 6–20)
CALCIUM: 8.6 mg/dL — AB (ref 8.9–10.3)
CHLORIDE: 107 mmol/L (ref 101–111)
CO2: 29 mmol/L (ref 22–32)
Creatinine, Ser: 0.99 mg/dL (ref 0.61–1.24)
GFR calc Af Amer: >60 mL/min (ref >60)
GFR calc non Af Amer: >60 mL/min (ref >60)
GLUCOSE: 95 mg/dL (ref 65–99)
POTASSIUM: 3.6 mmol/L (ref 3.5–5.1)
Sodium: 139 mmol/L (ref 135–145)
Total Protein: 5.9 g/dL — ABNORMAL LOW (ref 6.5–8.1)

## 2016-02-09 LAB — PROTIME-INR
INR: 1.1
PROTHROMBIN TIME: 14.4 s (ref 11.4–15.0)

## 2016-02-09 LAB — BLOOD GAS, VENOUS
Acid-Base Excess: 5 mmol/L — ABNORMAL HIGH (ref 0.0–3.0)
Bicarbonate: 31.4 mEq/L — ABNORMAL HIGH (ref 21.0–28.0)
O2 SAT: 78.7 %
PCO2 VEN: 53 mmHg (ref 44.0–60.0)
PH VEN: 7.38 (ref 7.320–7.430)
PO2 VEN: 44 mmHg (ref 31.0–45.0)
Patient temperature: 37

## 2016-02-09 LAB — ETHANOL: Alcohol, Ethyl (B): <5 mg/dL (ref <5)

## 2016-02-09 LAB — GLUCOSE, CAPILLARY
GLUCOSE-CAPILLARY: 90 mg/dL (ref 65–99)
GLUCOSE-CAPILLARY: 107 mg/dL — AB (ref 65–99)
GLUCOSE-CAPILLARY: 202 mg/dL — AB (ref 65–99)

## 2016-02-09 LAB — APTT: aPTT: 31 seconds (ref 24–36)

## 2016-02-09 LAB — TROPONIN I: TROPONIN I: 0.04 ng/mL — AB (ref <0.03)

## 2016-02-09 MED ORDER — SODIUM CHLORIDE 0.9 % IV BOLUS (SEPSIS)
1000.0000 mL | Freq: Once | INTRAVENOUS | Status: AC
  Administered 2016-02-09: 1000 mL via INTRAVENOUS

## 2016-02-09 MED ORDER — DEXTROSE 5 % IV BOLUS
1000.0000 mL | Freq: Once | INTRAVENOUS | Status: AC
  Administered 2016-02-09: 1000 mL via INTRAVENOUS

## 2016-02-09 NOTE — ED Notes (Signed)
Blood was sent to the lab for further analysis

## 2016-02-09 NOTE — ED Notes (Signed)
Lab called with critical lab value of troponin of 0.04 - reported to Dr Sharma Covert

## 2016-02-09 NOTE — ED Notes (Addendum)
CBG 202 

## 2016-02-09 NOTE — ED Notes (Signed)
CBG 107 

## 2016-02-09 NOTE — ED Provider Notes (Signed)
[facility] Emergency Department Provider Note  ____________________________________________  Time seen: Approximately 10:41 PM  I have reviewed the triage vital signs and the nursing notes.   HISTORY  Chief Complaint Hypoglycemia  The patient history is limited due to his altered mental status.  HPI KEYSHAWN Krueger is a 82 y.o. male who lives independently with a history of A. fib, CAD, COPD, DM brought by EMS for altered mental status and hypoglycemia.The patient is unable to give me any history but states that he is not having any pain. His son is here who states that he lives 400 feet away from the patient received a phone call tonight that just asked for help. He reports that the patient has been refusing to eat and drink over the past several weeks. In addition, he has had worsening mental status and gets easily confused about historical events. No acute illness over the last 2-3 days has been noted. On arrival EMS noted the patient was obtunded with a blood sugar in the 30s. He received an amp of D50 and blood sugar went up to the 200s with improving mental status. On arrival to the emergency department, the patient had a blood pressure in the 70s, heart rate in the 40s, and was somnolent but arousable to verbal stimulus.   Past Medical History:  Diagnosis Date  . A-fib (HCC)   . CHF (congestive heart failure) (HCC)   . COPD (chronic obstructive pulmonary disease) (HCC)   . Diabetes mellitus without complication (HCC)   . High cholesterol   . Hypertension     Patient Active Problem List   Diagnosis Date Noted  . Generalized abdominal pain   . Atrial fibrillation (HCC) 11/19/2015  . AKI (acute kidney injury) (HCC) 11/16/2015  . Dehydration 11/16/2015  . Leukocytosis 11/16/2015  . CAD (coronary artery disease) 11/16/2015  . COPD (chronic obstructive pulmonary disease) (HCC) 11/16/2015  . GERD (gastroesophageal reflux disease) 11/16/2015  . Dyspnea  11/16/2015    Past Surgical History:  Procedure Laterality Date  . BACK SURGERY    . CARPAL TUNNEL RELEASE    . CHOLECYSTECTOMY      Current Outpatient Rx  . Order #: [id number] Class: Historical Med  . Order #: [id number] Class: Print  . Order #: [id number] Class: Historical Med  . Order #: [id number] Class: Historical Med  . Order #: [id number] Class: Historical Med  . Order #: [id number] Class: Historical Med  . Order #: [id number] Class: Historical Med  . Order #: [id number] Class: Print  . Order #: [id number] Class: Historical Med  . Order #: [id number] Class: Print  . Order #: [id number] Class: Historical Med  . Order #: [id number] Class: Historical Med  . Order #: [id number] Class: Historical Med  . Order #: [id number] Class: Historical Med  . Order #: [id number] Class: Historical Med  . Order #: [id number] Class: Historical Med  . Order #: [id number] Class: Historical Med  . Order #: [id number] Class: Historical Med  . Order #: [id number] Class: Historical Med  . Order #: [id number] Class: Historical Med  . Order #: [id number] Class: Historical Med  . Order #: [id number] Class: Historical Med  . Order #: [id number] Class: Print  . Order #: [id number] Class: Historical Med  . Order #: [id number] Class: Historical Med    Allergies Aldactone [spironolactone]; Aspirin; Ciprofloxacin; Cyclobenzaprine; and Lexapro [escitalopram]  Family History  Problem Relation Age of Onset  . Prostate cancer      Social History Social History  Substance Use Topics  . Smoking status: Former Games developer  . Smokeless tobacco: Never Used  . Alcohol use No  Review of Systems Unable to obtain due to patient mental status. ____________________________________________   PHYSICAL EXAM:  VITAL SIGNS: ED Triage Vitals [02/09/16 2230]  Enc Vitals Group     BP (!) 71/41     Pulse Rate (!) 55     Resp (!) 22     Temp 97.6 F (36.4 C)     Temp Source Oral     SpO2 93 %     Weight 202 lb 4.8 oz (91.8 kg)     Height   (1.702 m)     Head Circumference      Peak Flow      Pain Score      Pain Loc      Pain Edu?      Excl. in GC?     Constitutional: Patient is somnolent but opens his eyes and answer some basic questions with verbal stimulus. He is protecting his airway on his arrival to the emergency department. He is chronically ill-appearing Eyes: Conjunctivae are normal.  EOMI. No scleral icterus. No eye discharge. PERRLA. Head: Atraumatic. No raccoon eyes or Battle sign. Nose: No congestion/rhinnorhea. No swelling over the nose or septal hematoma. Mouth/Throat: Mucous membranes are dry.  Neck: No stridor.  Supple.  No JVD. Cardiovascular: Slow rate, regular rhythm. No murmurs, rubs or gallops.  Respiratory: Normal respiratory effort.  No accessory muscle use or retractions. Lungs CTAB.  No wheezes, rales or ronchi. Gastrointestinal: Obese. Soft, nontender and nondistended.  No guarding or rebound.  No peritoneal signs. Musculoskeletal: No LE edema. No ttp in the calves or palpable cords.  Negative Homan's sign. Neurologic:  Patient is somnolent but opens his eyes to verbal stimulus. GCS is 15. Face is symmetric. PERRLA. Moves all extremities. The patient is unable to comply with a full neurologic examination. Skin:  Skin is warm, dry and intact. No rash noted. Positive pallor. Psychiatric: Unable to assess ____________________________________________   LABS (all labs ordered are listed, but only abnormal results are displayed)  Labs Reviewed  CBC - Abnormal; Notable for the following:       Result Value   RBC 3.86 (*)    Hemoglobin 12.1 (*)    HCT 34.5 (*)    All other components within normal limits  BLOOD GAS, VENOUS - Abnormal; Notable for the following:    Bicarbonate 31.4 (*)    Acid-Base Excess 5.0 (*)    All other components within normal limits  GLUCOSE, CAPILLARY - Abnormal; Notable for the following:    Glucose-Capillary 202 (*)    All other components within normal limits   GLUCOSE, CAPILLARY - Abnormal; Notable for the following:    Glucose-Capillary 107 (*)    All other components within normal limits  CULTURE, BLOOD (ROUTINE X 2)  CULTURE, BLOOD (ROUTINE X 2)  URINE CULTURE  ETHANOL  APTT  PROTIME-INR  GLUCOSE, CAPILLARY  COMPREHENSIVE METABOLIC PANEL  LACTIC ACID, PLASMA  LACTIC ACID, PLASMA  URINALYSIS COMPLETEWITH MICROSCOPIC ([facility] ONLY)  TROPONIN I  URINE DRUG SCREEN, QUALITATIVE ([facility] ONLY)   ____________________________________________  EKG  ED ECG REPORT I, Rockne Menghini, the attending physician, personally viewed and interpreted this ECG.   Date: 02/09/2016  EKG Time: 2231  Rate: 45  Rhythm: normal EKG, normal sinus rhythm, unchanged from previous tracings, sinus bradycardia  Axis: Leftward  Intervals:Prolonged QTC. Prolonged QRS nonspecific interventricular conduction delay. First degree block.  ST&T Change: 0.5 mm of ST elevation in V2. Nonspecific T-wave inversions in V5 and V6.  This EKG is compared to 02/07/16 and is grossly unchanged in morphology however the bradycardia is new. ____________________________________________  RADIOLOGY  Dg Chest Portable 1 View  Result Date: 02/09/2016 CLINICAL DATA:  Unresponsive EXAM: PORTABLE CHEST 1 VIEW COMPARISON:  02/07/2016 FINDINGS: Cardiac shadow remains enlarged. The lungs are again well aerated without focal infiltrate or sizable effusion. Minimal left basilar atelectasis is noted. Postsurgical changes in the cervical spine are seen. Aortic calcifications are again noted. IMPRESSION: Minimal left basilar atelectasis. Aortic atherosclerosis Electronically Signed   By: Alcide Clever M.D.   On: 02/09/2016 23:17   ____________________________________________   PROCEDURES  Procedure(s) performed: None  Procedures  Critical Care performed: Yes ____________________________________________   INITIAL IMPRESSION / ASSESSMENT AND PLAN / ED COURSE  Pertinent labs & imaging  results that were available during my care of the patient were reviewed by me and considered in my medical decision making (see chart for details).  82 y.o. male brought by EMS with hypoglycemia, hypotension, and sinus bradycardia. Overall, the patient is unwell appearing. I am concerned about multiple possible etiologies including hypoglycemia; he does have sinus bradycardia so ACS or MI may be the driving force for his hypoglycemia. Iatrogenic hypoglycemia from insulin may also be a cause. I'm concerned about overdose in this patient as his son reports that he has been feeling depressed recently although his son does not think that he would commit or tried to commit suicide. I will get the patient cultured up with urinalysis and to evaluate for possible infection although I do not have any signs or symptoms of infection at this time. The patient will undergo CT scan of the head to evaluate for intracranial pathology. The patient will require admission to the hospital.  CRITICAL CARE Performed by: Rockne Menghini   Total critical care time: 45 minutes  Critical care time was exclusive of separately billable procedures and treating other patients.  Critical care was necessary to treat or prevent imminent or life-threatening deterioration.  Critical care was time spent personally by me on the following activities: development of treatment plan with patient and/or surrogate as well as nursing, discussions with consultants, evaluation of patient's response to treatment, examination of patient, obtaining history from patient or surrogate, ordering and performing treatments and interventions, ordering and review of laboratory studies, ordering and review of radiographic studies, pulse oximetry and re-evaluation of patient's condition.  ____________________________________________  FINAL CLINICAL IMPRESSION(S) / ED DIAGNOSES  Final diagnoses:  Hypotension, unspecified hypotension type  Sinus  bradycardia  Hypoglycemia  Altered mental status, unspecified altered mental status type    Clinical Course   ----------------------------------------- 11:35 PM on 02/09/2016 -----------------------------------------  I have reevaluated the patient who initially was responsive to fluid, but then his blood pressure 1 back down into the 80s. With an additional liter fluid, his blood pressure is now 97/56. His heart rate continues to remain bradycardic in the 40s. His repeat blood sugar is 109. He continues to be somnolent but arousable to verbal stimulus. An in and out catheter for urine shows dark concentrated urine which is consistent with his dehydrated appearance. I am awaiting his imaging results in order to admit him to the hospitalist.  This patient will be signed out to Dr. Scotty Court who will follow up imaging results and make a final disposition for the patient. The patient's son is up-to-date and understands the plan.   NEW MEDICATIONS STARTED DURING THIS VISIT:  New Prescriptions   No medications on file      Anne-Caroline  Sharma Covert, MD 02/09/16 2337

## 2016-02-09 NOTE — ED Triage Notes (Signed)
[facility] called to home for unresponsive pt. Initial CBG 30, in route was 214 after amp D50. Cyanotic lips, O2 87%, on 4L up to 95%. EMS BP 80/40, lack of appetite due to nausea.   Upon arrival responsive to painful stimuli, CBG90, BP 71/41

## 2016-02-10 ENCOUNTER — Encounter: Payer: Self-pay | Admitting: Internal Medicine

## 2016-02-10 DIAGNOSIS — Z79899 Other long term (current) drug therapy: Secondary | ICD-10-CM | POA: Diagnosis not present

## 2016-02-10 DIAGNOSIS — E11649 Type 2 diabetes mellitus with hypoglycemia without coma: Secondary | ICD-10-CM | POA: Diagnosis present

## 2016-02-10 DIAGNOSIS — E785 Hyperlipidemia, unspecified: Secondary | ICD-10-CM | POA: Diagnosis present

## 2016-02-10 DIAGNOSIS — I110 Hypertensive heart disease with heart failure: Secondary | ICD-10-CM | POA: Diagnosis present

## 2016-02-10 DIAGNOSIS — Z794 Long term (current) use of insulin: Secondary | ICD-10-CM | POA: Diagnosis not present

## 2016-02-10 DIAGNOSIS — Z7951 Long term (current) use of inhaled steroids: Secondary | ICD-10-CM | POA: Diagnosis not present

## 2016-02-10 DIAGNOSIS — J441 Chronic obstructive pulmonary disease with (acute) exacerbation: Secondary | ICD-10-CM | POA: Diagnosis present

## 2016-02-10 DIAGNOSIS — Z7902 Long term (current) use of antithrombotics/antiplatelets: Secondary | ICD-10-CM | POA: Diagnosis not present

## 2016-02-10 DIAGNOSIS — Z9049 Acquired absence of other specified parts of digestive tract: Secondary | ICD-10-CM | POA: Diagnosis not present

## 2016-02-10 DIAGNOSIS — Z87891 Personal history of nicotine dependence: Secondary | ICD-10-CM | POA: Diagnosis not present

## 2016-02-10 DIAGNOSIS — Z9889 Other specified postprocedural states: Secondary | ICD-10-CM | POA: Diagnosis not present

## 2016-02-10 DIAGNOSIS — I509 Heart failure, unspecified: Secondary | ICD-10-CM | POA: Diagnosis present

## 2016-02-10 DIAGNOSIS — R001 Bradycardia, unspecified: Secondary | ICD-10-CM | POA: Diagnosis present

## 2016-02-10 DIAGNOSIS — Z888 Allergy status to other drugs, medicaments and biological substances status: Secondary | ICD-10-CM | POA: Diagnosis not present

## 2016-02-10 DIAGNOSIS — I482 Chronic atrial fibrillation: Secondary | ICD-10-CM | POA: Diagnosis present

## 2016-02-10 DIAGNOSIS — E162 Hypoglycemia, unspecified: Secondary | ICD-10-CM | POA: Diagnosis present

## 2016-02-10 DIAGNOSIS — Z8042 Family history of malignant neoplasm of prostate: Secondary | ICD-10-CM | POA: Diagnosis not present

## 2016-02-10 DIAGNOSIS — Z7901 Long term (current) use of anticoagulants: Secondary | ICD-10-CM | POA: Diagnosis not present

## 2016-02-10 DIAGNOSIS — I959 Hypotension, unspecified: Secondary | ICD-10-CM | POA: Diagnosis present

## 2016-02-10 LAB — TROPONIN I
TROPONIN I: 0.03 ng/mL — AB (ref <0.03)
TROPONIN I: 0.03 ng/mL — AB (ref <0.03)

## 2016-02-10 LAB — URINALYSIS COMPLETE WITH MICROSCOPIC (ARMC ONLY)
BILIRUBIN URINE: NEGATIVE
GLUCOSE, UA: NEGATIVE mg/dL
Ketones, ur: NEGATIVE mg/dL
LEUKOCYTES UA: NEGATIVE
NITRITE: NEGATIVE
Protein, ur: 100 mg/dL — AB
SPECIFIC GRAVITY, URINE: 1.014 (ref 1.005–1.030)
SQUAMOUS EPITHELIAL / LPF: NONE SEEN
pH: 6 (ref 5.0–8.0)

## 2016-02-10 LAB — BASIC METABOLIC PANEL
Anion gap: 2 — ABNORMAL LOW (ref 5–15)
BUN: 8 mg/dL (ref 6–20)
CALCIUM: 8.5 mg/dL — AB (ref 8.9–10.3)
CO2: 29 mmol/L (ref 22–32)
CREATININE: 0.87 mg/dL (ref 0.61–1.24)
Chloride: 108 mmol/L (ref 101–111)
Glucose, Bld: 79 mg/dL (ref 65–99)
Potassium: 3.6 mmol/L (ref 3.5–5.1)
SODIUM: 139 mmol/L (ref 135–145)

## 2016-02-10 LAB — GLUCOSE, CAPILLARY
GLUCOSE-CAPILLARY: 89 mg/dL (ref 65–99)
GLUCOSE-CAPILLARY: 103 mg/dL — AB (ref 65–99)
GLUCOSE-CAPILLARY: 77 mg/dL (ref 65–99)
GLUCOSE-CAPILLARY: 115 mg/dL — AB (ref 65–99)
GLUCOSE-CAPILLARY: 96 mg/dL (ref 65–99)
Glucose-Capillary: 100 mg/dL — ABNORMAL HIGH (ref 65–99)
Glucose-Capillary: 122 mg/dL — ABNORMAL HIGH (ref 65–99)
Glucose-Capillary: 123 mg/dL — ABNORMAL HIGH (ref 65–99)
Glucose-Capillary: 163 mg/dL — ABNORMAL HIGH (ref 65–99)
Glucose-Capillary: 168 mg/dL — ABNORMAL HIGH (ref 65–99)
Glucose-Capillary: 71 mg/dL (ref 65–99)
Glucose-Capillary: 74 mg/dL (ref 65–99)
Glucose-Capillary: 79 mg/dL (ref 65–99)
Glucose-Capillary: 87 mg/dL (ref 65–99)
Glucose-Capillary: 95 mg/dL (ref 65–99)

## 2016-02-10 LAB — TSH: TSH: 0.821 u[IU]/mL (ref 0.350–4.500)

## 2016-02-10 LAB — LACTIC ACID, PLASMA
LACTIC ACID, VENOUS: 1 mmol/L (ref 0.5–1.9)
LACTIC ACID, VENOUS: 0.9 mmol/L (ref 0.5–1.9)

## 2016-02-10 LAB — CBC
HCT: 35.9 % — ABNORMAL LOW (ref 40.0–52.0)
Hemoglobin: 12.4 g/dL — ABNORMAL LOW (ref 13.0–18.0)
MCH: 31.3 pg (ref 26.0–34.0)
MCHC: 34.5 g/dL (ref 32.0–36.0)
MCV: 90.8 fL (ref 80.0–100.0)
PLATELETS: 140 10*3/uL — AB (ref 150–440)
RBC: 3.95 MIL/uL — AB (ref 4.40–5.90)
RDW: 13.4 % (ref 11.5–14.5)
WBC: 4.9 10*3/uL (ref 3.8–10.6)

## 2016-02-10 LAB — URINE DRUG SCREEN, QUALITATIVE (ARMC ONLY)
Amphetamines, Ur Screen: NOT DETECTED
BARBITURATES, UR SCREEN: NOT DETECTED
Benzodiazepine, Ur Scrn: NOT DETECTED
CANNABINOID 50 NG, UR ~~LOC~~: NOT DETECTED
COCAINE METABOLITE, UR ~~LOC~~: NOT DETECTED
MDMA (Ecstasy)Ur Screen: NOT DETECTED
Methadone Scn, Ur: NOT DETECTED
OPIATE, UR SCREEN: NOT DETECTED
PHENCYCLIDINE (PCP) UR S: NOT DETECTED
Tricyclic, Ur Screen: NOT DETECTED

## 2016-02-10 MED ORDER — POLYMYXIN B-TRIMETHOPRIM 10000-0.1 UNIT/ML-% OP SOLN
2.0000 [drp] | Freq: Four times a day (QID) | OPHTHALMIC | Status: DC
  Administered 2016-02-10 – 2016-02-14 (×15): 2 [drp] via OPHTHALMIC
  Filled 2016-02-10: qty 10

## 2016-02-10 MED ORDER — APIXABAN 5 MG PO TABS
5.0000 mg | ORAL_TABLET | Freq: Two times a day (BID) | ORAL | Status: DC
  Administered 2016-02-10 – 2016-02-14 (×9): 5 mg via ORAL
  Filled 2016-02-10 (×9): qty 1

## 2016-02-10 MED ORDER — TIOTROPIUM BROMIDE MONOHYDRATE 18 MCG IN CAPS
18.0000 ug | ORAL_CAPSULE | Freq: Every day | RESPIRATORY_TRACT | Status: DC
  Administered 2016-02-10 – 2016-02-14 (×5): 18 ug via RESPIRATORY_TRACT
  Filled 2016-02-10 (×2): qty 5

## 2016-02-10 MED ORDER — DEXTROSE 5 % IV SOLN
Freq: Once | INTRAVENOUS | Status: AC
  Administered 2016-02-10: 03:00:00 via INTRAVENOUS

## 2016-02-10 MED ORDER — TERAZOSIN HCL 5 MG PO CAPS
5.0000 mg | ORAL_CAPSULE | Freq: Every evening | ORAL | Status: DC
  Administered 2016-02-10 – 2016-02-13 (×4): 5 mg via ORAL
  Filled 2016-02-10 (×4): qty 1

## 2016-02-10 MED ORDER — TRIAMCINOLONE ACETONIDE 0.1 % EX CREA
1.0000 "application " | TOPICAL_CREAM | Freq: Two times a day (BID) | CUTANEOUS | Status: DC | PRN
  Filled 2016-02-10: qty 15

## 2016-02-10 MED ORDER — SENNOSIDES-DOCUSATE SODIUM 8.6-50 MG PO TABS: 1.0000 | ORAL_TABLET | Freq: Every evening | ORAL | Status: DC | PRN

## 2016-02-10 MED ORDER — BRIMONIDINE TARTRATE 0.2 % OP SOLN
1.0000 [drp] | Freq: Every day | OPHTHALMIC | Status: DC
Start: 2016-02-10 — End: 2016-02-14
  Administered 2016-02-10 – 2016-02-14 (×5): 1 [drp] via OPHTHALMIC
  Filled 2016-02-10: qty 5

## 2016-02-10 MED ORDER — ONDANSETRON HCL 4 MG PO TABS: 4.0000 mg | ORAL_TABLET | Freq: Four times a day (QID) | ORAL | Status: DC | PRN

## 2016-02-10 MED ORDER — ONDANSETRON HCL 4 MG/2ML IJ SOLN
4.0000 mg | Freq: Four times a day (QID) | INTRAMUSCULAR | Status: DC | PRN
  Administered 2016-02-11: 4 mg via INTRAVENOUS
  Filled 2016-02-10: qty 2

## 2016-02-10 MED ORDER — SODIUM CHLORIDE 0.9% FLUSH
3.0000 mL | Freq: Two times a day (BID) | INTRAVENOUS | Status: DC
  Administered 2016-02-10 – 2016-02-14 (×9): 3 mL via INTRAVENOUS

## 2016-02-10 MED ORDER — OMEGA-3-ACID ETHYL ESTERS 1 G PO CAPS
1.0000 g | ORAL_CAPSULE | Freq: Every day | ORAL | Status: DC
  Administered 2016-02-10 – 2016-02-14 (×5): 1 g via ORAL
  Filled 2016-02-10 (×5): qty 1

## 2016-02-10 MED ORDER — ACETAMINOPHEN 650 MG RE SUPP: 650.0000 mg | Freq: Four times a day (QID) | RECTAL | Status: DC | PRN

## 2016-02-10 MED ORDER — PANTOPRAZOLE SODIUM 40 MG PO TBEC
40.0000 mg | DELAYED_RELEASE_TABLET | Freq: Every day | ORAL | Status: DC
  Administered 2016-02-10 – 2016-02-14 (×5): 40 mg via ORAL
  Filled 2016-02-10 (×5): qty 1

## 2016-02-10 MED ORDER — KETOROLAC TROMETHAMINE 0.5 % OP SOLN
1.0000 [drp] | Freq: Four times a day (QID) | OPHTHALMIC | Status: DC
  Administered 2016-02-10 – 2016-02-14 (×17): 1 [drp] via OPHTHALMIC
  Filled 2016-02-10: qty 3

## 2016-02-10 MED ORDER — SIMVASTATIN 40 MG PO TABS
40.0000 mg | ORAL_TABLET | Freq: Every evening | ORAL | Status: DC
Start: 2016-02-10 — End: 2016-02-14
  Administered 2016-02-10 – 2016-02-13 (×4): 40 mg via ORAL
  Filled 2016-02-10 (×4): qty 1

## 2016-02-10 MED ORDER — POLYVINYL ALCOHOL 1.4 % OP SOLN
1.0000 [drp] | OPHTHALMIC | Status: DC | PRN
  Filled 2016-02-10: qty 15

## 2016-02-10 MED ORDER — ACETAMINOPHEN 325 MG PO TABS
650.0000 mg | ORAL_TABLET | Freq: Four times a day (QID) | ORAL | Status: DC | PRN
  Filled 2016-02-10: qty 2

## 2016-02-10 MED ORDER — IPRATROPIUM-ALBUTEROL 0.5-2.5 (3) MG/3ML IN SOLN
3.0000 mL | Freq: Four times a day (QID) | RESPIRATORY_TRACT | Status: DC | PRN
  Administered 2016-02-14: 3 mL via RESPIRATORY_TRACT
  Filled 2016-02-10: qty 3

## 2016-02-10 MED ORDER — MOMETASONE FURO-FORMOTEROL FUM 200-5 MCG/ACT IN AERO
2.0000 | INHALATION_SPRAY | Freq: Two times a day (BID) | RESPIRATORY_TRACT | Status: DC
  Administered 2016-02-10 – 2016-02-12 (×6): 2 via RESPIRATORY_TRACT
  Filled 2016-02-10: qty 8.8

## 2016-02-10 MED ORDER — LATANOPROST 0.005 % OP SOLN
1.0000 [drp] | Freq: Every day | OPHTHALMIC | Status: DC
Start: 2016-02-10 — End: 2016-02-14
  Administered 2016-02-10 – 2016-02-13 (×4): 1 [drp] via OPHTHALMIC
  Filled 2016-02-10: qty 2.5

## 2016-02-10 MED ORDER — CLOPIDOGREL BISULFATE 75 MG PO TABS
75.0000 mg | ORAL_TABLET | Freq: Every day | ORAL | Status: DC
  Administered 2016-02-10: 75 mg via ORAL
  Filled 2016-02-10: qty 1

## 2016-02-10 MED ORDER — ALBUTEROL SULFATE (2.5 MG/3ML) 0.083% IN NEBU: 3.0000 mL | INHALATION_SOLUTION | RESPIRATORY_TRACT | Status: DC | PRN

## 2016-02-10 MED ORDER — OMEGA-3 1000 MG PO CAPS: 1000.0000 mg | ORAL_CAPSULE | Freq: Every day | ORAL | Status: DC

## 2016-02-10 MED ORDER — DEXTROSE-NACL 5-0.9 % IV SOLN
INTRAVENOUS | Status: DC
  Administered 2016-02-10 – 2016-02-11 (×4): via INTRAVENOUS

## 2016-02-10 NOTE — ED Notes (Addendum)
CBG 89 daughter leaving to go home and request that she be called if he is admitted or any changes in condition  [phone] [facility]

## 2016-02-10 NOTE — ED Notes (Signed)
CBG 122 

## 2016-02-10 NOTE — ED Provider Notes (Signed)
CT head unremarkable After previous D5 infusion completed, serial blood sugar checks showed a continuous decline in the serum blood glucose. When it got below 100, because the obvious trend was that the patient would soon become hypoglycemic, I started him on a D5 drip at 150 per hour. Glucose is stabilized with that infusion. Patient is to be admitted by hospitalist for further management.   Sharman Cheek, MD 02/10/16 [phone]

## 2016-02-10 NOTE — ED Notes (Signed)
CBG 87. 

## 2016-02-10 NOTE — H&P (Addendum)
[facility] - [place] [facility]   PATIENT NAME: Bobby Krueger    MR#:  [id number]  DATE OF BIRTH:  12-06-1926  DATE OF ADMISSION:  02/09/2016  PRIMARY CARE PHYSICIAN: [facility]   REQUESTING/REFERRING PHYSICIAN:   CHIEF COMPLAINT:   Chief Complaint  Patient presents with  . Hypoglycemia    HISTORY OF PRESENT ILLNESS: Bobby Krueger  is a 82 y.o. male with a known history of Type 2 diabetes mellitus insulin-dependent, atrial fibrillation on eliquis for anticoagulation, congestive heart failure, COPD, hyperlipidemia, hypertension presented to the emergency room with decreased responsiveness. Patient was at home sitting on the bed felt dizzy and confused. Son called EMS blood sugar was checked by the EMS and blood sugar was around 30 mg/dL in the field. He was given IV D50 and brought to the emergency room. Patient was put on IV D5 normal saline fluid and blood sugar was brought up in the emergency room. Patient does not remember the exact sequence of events but says he did not completely pass out. No history of any head injury. No complaints of any chest pain. No complaints of any shortness of breath. No history of any seizure. Patient was worked with a CT head in the emergency room which showed no acute intracranial abnormality. No history of fever or chills or cough. In the emergency room EKG showed bradycardia. Patient blood pressure was also low when he presented to the emergency room and he was given IV fluids. Bedside lactate level was normal and WBC count in the blood was also normal. Hospitalist service was consulted for further care of the patient. Patient lives alone at home and walks with a walker.  PAST MEDICAL HISTORY:   Past Medical History:  Diagnosis Date  . A-fib (HCC)   . CHF (congestive heart failure) (HCC)   . COPD (chronic obstructive pulmonary disease) (HCC)   . Diabetes mellitus without complication (HCC)   . High cholesterol    . Hypertension     PAST SURGICAL HISTORY: Past Surgical History:  Procedure Laterality Date  . BACK SURGERY    . CARPAL TUNNEL RELEASE    . CHOLECYSTECTOMY      SOCIAL HISTORY:  Social History  Substance Use Topics  . Smoking status: Former Games developer  . Smokeless tobacco: Never Used  . Alcohol use No    FAMILY HISTORY:  Family History  Problem Relation Age of Onset  . Prostate cancer      DRUG ALLERGIES:  Allergies  Allergen Reactions  . Aldactone [Spironolactone] Hives  . Aspirin Hives  . Ciprofloxacin Hives  . Cyclobenzaprine Hives  . Lexapro [Escitalopram] Hives    REVIEW OF SYSTEMS:   CONSTITUTIONAL: No fever, has weakness.  EYES: No blurred or double vision.  EARS, NOSE, AND THROAT: No tinnitus or ear pain.  RESPIRATORY: No cough, shortness of breath, wheezing or hemoptysis.  CARDIOVASCULAR: No chest pain, orthopnea, edema.  GASTROINTESTINAL: No nausea, vomiting, diarrhea or abdominal pain.  GENITOURINARY: No dysuria, hematuria.  ENDOCRINE: No polyuria, nocturia,  HEMATOLOGY: No anemia, easy bruising or bleeding SKIN: No rash or lesion. MUSCULOSKELETAL: No joint pain or arthritis.   NEUROLOGIC: No tingling, numbness, weakness. Felt dizzy PSYCHIATRY: No anxiety or depression.   MEDICATIONS AT HOME:  Prior to Admission medications   Medication Sig Start Date End Date Taking? Authorizing Provider  albuterol (PROAIR HFA) 108 (90 Base) MCG/ACT inhaler Inhale 2 puffs into the lungs every 4 (four) hours as needed for wheezing  or shortness of breath.    Yes Historical Provider, MD  apixaban (ELIQUIS) 5 MG TABS tablet Take 1 tablet (5 mg total) by mouth 2 (two) times daily. 11/21/15  Yes [name], MD  brimonidine (ALPHAGAN) 0.2 % ophthalmic solution Place 1 drop into both eyes daily.    Yes Historical Provider, MD  carvedilol (COREG) 12.5 MG tablet Take 12.5 mg by mouth 2 (two) times daily.   Yes Historical Provider, MD  Fluticasone-Salmeterol (ADVAIR DISKUS)  250-50 MCG/DOSE AEPB Inhale 1 puff into the lungs daily as needed (for wheezing or shortness).    Yes Historical Provider, MD  furosemide (LASIX) 40 MG tablet Take 40 mg by mouth daily as needed for fluid or edema.    Yes Historical Provider, MD  gabapentin (NEURONTIN) 300 MG capsule Take 300 mg by mouth at bedtime.   Yes Historical Provider, MD  insulin glargine (LANTUS) 100 UNIT/ML injection Inject 0.31 mLs (31 Units total) into the skin at bedtime. Patient taking differently: Inject 62 Units into the skin at bedtime.  11/21/15  Yes [name], MD  ipratropium-albuterol (DUONEB) 0.5-2.5 (3) MG/3ML SOLN Take 3 mLs by nebulization every 6 (six) hours as needed (for SOB).   Yes Historical Provider, MD  ketorolac (ACULAR) 0.5 % ophthalmic solution Place 1 drop into the right eye 4 (four) times daily. 02/02/16  Yes Christiane Ha D Cuthriell, PA-C  latanoprost (XALATAN) 0.005 % ophthalmic solution Place 1 drop into both eyes at bedtime. Reported on 12/21/2015   Yes Historical Provider, MD  losartan (COZAAR) 100 MG tablet Take 100 mg by mouth daily. 12/31/15  Yes Historical Provider, MD  nateglinide (STARLIX) 120 MG tablet Take 120 mg by mouth daily. 10/02/15  Yes Historical Provider, MD  Omega-3 1000 MG CAPS Take 1 g by mouth daily.   Yes Historical Provider, MD  omeprazole (PRILOSEC) 20 MG capsule Take 20 mg by mouth 2 (two) times daily.   Yes Historical Provider, MD  polyvinyl alcohol (LIQUIFILM TEARS) 1.4 % ophthalmic solution Place 1 drop into both eyes as needed for dry eyes. Reported on 12/21/2015   Yes Historical Provider, MD  potassium chloride SA (K-DUR,KLOR-CON) 20 MEQ tablet Take 20 mEq by mouth daily.   Yes Historical Provider, MD  promethazine (PHENERGAN) 25 MG tablet Take 12.5-25 mg by mouth every 4 (four) hours as needed.   Yes Historical Provider, MD  simvastatin (ZOCOR) 40 MG tablet Take 40 mg by mouth every evening.   Yes Historical Provider, MD  SPIRIVA HANDIHALER 18 MCG inhalation capsule  Place 1 application into inhaler and inhale daily. 10/13/15  Yes Historical Provider, MD  terazosin (HYTRIN) 5 MG capsule Take 5 mg by mouth every evening.   Yes Historical Provider, MD  triamcinolone cream (KENALOG) 0.1 % Apply 1 application topically 2 (two) times daily as needed (for irritation).   Yes Historical Provider, MD  trimethoprim-polymyxin b (POLYTRIM) ophthalmic solution Place 2 drops into the right eye every 6 (six) hours. 02/02/16  Yes Christiane Ha D Cuthriell, PA-C  zolpidem (AMBIEN) 10 MG tablet Take 10 mg by mouth at bedtime as needed for sleep.    Yes Historical Provider, MD  clopidogrel (PLAVIX) 75 MG tablet Take 75 mg by mouth daily.    Historical Provider, MD      PHYSICAL EXAMINATION:   VITAL SIGNS: Blood pressure (!) 106/53, pulse (!) 46, temperature 97.6 F (36.4 C), temperature source Oral, resp. rate 14, height 5\' 7"  (1.702 m), weight 91.8 kg (202 lb 4.8 oz),  SpO2 95 %.  GENERAL:  82 y.o.-year-old patient lying in the bed responding to all commands. EYES: Pupils equal, round, reactive to light and accommodation. No scleral icterus. Extraocular muscles intact.  HEENT: Head atraumatic, normocephalic. Oropharynx dry and nasopharynx clear.  NECK:  Supple, no jugular venous distention. No thyroid enlargement, no tenderness.  LUNGS: Normal breath sounds bilaterally, no wheezing, rales,rhonchi or crepitation. No use of accessory muscles of respiration.  CARDIOVASCULAR: S1, S2 bradycardia noted. No murmurs, rubs, or gallops.  ABDOMEN: Soft,obese, nontender, nondistended. Bowel sounds present. No organomegaly or mass.  EXTREMITIES: No pedal edema, cyanosis, or clubbing.  NEUROLOGIC: Cranial nerves II through XII are intact. Muscle strength 5/5 in all extremities. Sensation intact. Gait not checked.  PSYCHIATRIC: The patient is alert and oriented x 3.  SKIN: No obvious rash, lesion, or ulcer.   LABORATORY PANEL:   CBC  Recent Labs Lab 02/05/16 1519 02/07/16 0403  02/09/16 2244  WBC 7.1 7.4 5.8  HGB 13.8 14.5 12.1*  HCT 39.9* 41.8 34.5*  PLT 160 166 173  MCV 89.1 89.7 89.4  MCH 30.9 31.1 31.3  MCHC 34.7 34.7 35.0  RDW 13.1 13.4 13.0  LYMPHSABS  --  2.3  --   MONOABS  --  0.5  --   EOSABS  --  0.4  --   BASOSABS  --  0.1  --    ------------------------------------------------------------------------------------------------------------------  Chemistries   Recent Labs Lab 02/05/16 1519 02/07/16 0403 02/09/16 2244  NA 138 140 139  K 3.9 3.8 3.6  CL 103 105 107  CO2 GLUCOSE 95 128* 95  BUN CREATININE 0.78 1.08 0.99  CALCIUM 9.4 9.4 8.6*  AST ALT 12* 12* 13*  ALKPHOS 89 90 67  BILITOT 0.7 0.8 0.4   ------------------------------------------------------------------------------------------------------------------ estimated creatinine clearance is 56.8 mL/min (by C-G formula based on SCr of 0.99 mg/dL). ------------------------------------------------------------------------------------------------------------------ No results for input(s): TSH, T4TOTAL, T3FREE, THYROIDAB in the last 72 hours.  Invalid input(s): FREET3   Coagulation profile  Recent Labs Lab 02/09/16 2244  INR 1.10   ------------------------------------------------------------------------------------------------------------------- No results for input(s): DDIMER in the last 72 hours. -------------------------------------------------------------------------------------------------------------------  Cardiac Enzymes  Recent Labs Lab 02/07/16 0403 02/07/16 0744 02/09/16 2244  TROPONINI 0.03* 0.04* 0.04*   ------------------------------------------------------------------------------------------------------------------ Invalid input(s): POCBNP  ---------------------------------------------------------------------------------------------------------------  Urinalysis    Component Value Date/Time   COLORURINE YELLOW (A)  02/09/2016 2240   APPEARANCEUR CLEAR (A) 02/09/2016 2240   APPEARANCEUR Clear 06/05/2013 1502   LABSPEC 1.014 02/09/2016 2240   LABSPEC 1.012 06/05/2013 1502   PHURINE 6.0 02/09/2016 2240   GLUCOSEU NEGATIVE 02/09/2016 2240   GLUCOSEU 50 mg/dL 16/04/9603 5409   HGBUR 1+ (A) 02/09/2016 2240   BILIRUBINUR NEGATIVE 02/09/2016 2240   BILIRUBINUR Negative 06/05/2013 1502   KETONESUR NEGATIVE 02/09/2016 2240   PROTEINUR 100 (A) 02/09/2016 2240   NITRITE NEGATIVE 02/09/2016 2240   LEUKOCYTESUR NEGATIVE 02/09/2016 2240   LEUKOCYTESUR Negative 06/05/2013 1502     RADIOLOGY: Ct Head Wo Contrast  Result Date: 02/10/2016 CLINICAL DATA:  82 year old male with altered mental status EXAM: CT HEAD WITHOUT CONTRAST TECHNIQUE: Contiguous axial images were obtained from the base of the skull through the vertex without intravenous contrast. COMPARISON:  CT dated 11/16/2015 FINDINGS: The ventricles and sulci are appropriate in size for patient's age. Moderate periventricular and deep white matter chronic microvascular ischemic changes noted. There is no acute intracranial hemorrhage. No mass effect or midline shift noted. There is mild  mucoperiosteal thickening of paranasal sinuses. No air-fluid levels. The mastoid air cells are clear. The calvarium is intact. IMPRESSION: No acute intracranial hemorrhage. Age-related atrophy and chronic microvascular ischemic disease. If symptoms persist and there are no contraindications, MRI may provide better evaluation if clinically indicated. Electronically Signed   By: Elgie Collard M.D.   On: 02/10/2016 03:17  Dg Chest Portable 1 View  Result Date: 02/09/2016 CLINICAL DATA:  Unresponsive EXAM: PORTABLE CHEST 1 VIEW COMPARISON:  02/07/2016 FINDINGS: Cardiac shadow remains enlarged. The lungs are again well aerated without focal infiltrate or sizable effusion. Minimal left basilar atelectasis is noted. Postsurgical changes in the cervical spine are seen. Aortic  calcifications are again noted. IMPRESSION: Minimal left basilar atelectasis. Aortic atherosclerosis Electronically Signed   By: Alcide Clever M.D.   On: 02/09/2016 23:17   EKG: Orders placed or performed during the hospital encounter of 02/07/16  . EKG 12-Lead  . EKG 12-Lead  . EKG 12-Lead  . EKG 12-Lead    IMPRESSION AND PLAN: 82 year old elderly Caucasian male patient with history of type 2 diabetes mellitus insulin-dependent, hypertension, hyperlipidemia, COPD, congestive heart failure, atrial fibrillation presented to the emergency room with decreased responsiveness and low blood sugar along with low blood pressure. Admitting diagnosis 1. Altered mental status secondary to hypoglycemia 2. Hypoglycemia 3. Bradycardia 4. Hypotension 5. COPD which is stable Treatment plan : Admit patient to telemetry inpatient service IV fluid hydration with D5 normal saline Check blood sugars every 2 hourly Hold beta blocker in view of bradycardia Hold insulin medication and diabetic medications Regular diet Cycle troponin to rule out ischemia Resume eliquis for anticoagulation Resume statin medication for hyperlipidemia Supportive care.  All the records are reviewed and case discussed with ED provider. Management plans discussed with the patient, family and they are in agreement.  CODE STATUS:FULL Code Status History    Date Active Date Inactive Code Status Order ID Comments User Context   11/16/2015  5:20 AM 11/21/2015  7:37 PM Full Code [id number]  Clydie Braun, MD ED       TOTAL TIME TAKING CARE OF THIS PATIENT: 50 minutes.    Ihor Austin M.D on 02/10/2016 at 4:58 AM  Between 7am to 6pm - Pager - [phone]  After 6pm go to [email or web address] - password [email or web address] [facility]  [place] [facility]  Office  [phone]  CC: Primary care physician; [facility]

## 2016-02-10 NOTE — ED Notes (Signed)
  CBG 103  

## 2016-02-10 NOTE — Consult Note (Signed)
[facility] CLINIC CARDIOLOGY [facility]  CARDIOLOGY CONSULT NOTE  Patient ID: YUVAAN KAUK MRN: [id number] DOB/AGE: 1927-04-23 82 y.o.  Admit date: 02/09/2016 Referring Physician Dr. Amado Coe Primary Physician   Primary Cardiologist [place] Karl Reason for Consultation chronic afib and chf  HPI: Pt is a 82 yo male with history of afib anticoagulated with eliquis, diaabetes melitus, copd and hypertension  last seen in our office 2 years ago who was admitted with alterred mental status and  Noted to be relatively bradycardic and also noted ot have hypoglycemia. He was on carvedilol as outpatient. Heart rate has improved since coming off of the carvedilol.  He has ruled out for an mi.   Review of Systems  HENT: Negative.   Eyes: Negative.   Respiratory: Positive for shortness of breath.   Cardiovascular: Positive for leg swelling.  Gastrointestinal: Negative.   Genitourinary: Negative.   Musculoskeletal: Negative.   Skin: Negative.   Neurological: Positive for weakness.  Endo/Heme/Allergies: Bruises/bleeds easily.  Psychiatric/Behavioral: Positive for memory loss.    Past Medical History:  Diagnosis Date  . A-fib (HCC)   . CHF (congestive heart failure) (HCC)   . COPD (chronic obstructive pulmonary disease) (HCC)   . Diabetes mellitus without complication (HCC)   . High cholesterol   . Hypertension     Family History  Problem Relation Age of Onset  . Prostate cancer      Social History   Social History  . Marital status: Widowed    Spouse name: N/A  . Number of children: N/A  . Years of education: N/A   Occupational History  . retired    Social History Main Topics  . Smoking status: Former Games developer  . Smokeless tobacco: Never Used  . Alcohol use No  . Drug use: No  . Sexual activity: Not on file   Other Topics Concern  . Not on file   Social History Narrative  . No narrative on file    Past Surgical History:  Procedure Laterality Date  . BACK SURGERY     . CARPAL TUNNEL RELEASE    . CHOLECYSTECTOMY       Prescriptions Prior to Admission  Medication Sig Dispense Refill Last Dose  . albuterol (PROAIR HFA) 108 (90 Base) MCG/ACT inhaler Inhale 2 puffs into the lungs every 4 (four) hours as needed for wheezing or shortness of breath.    12/31/2015 at Unknown time  . apixaban (ELIQUIS) 5 MG TABS tablet Take 1 tablet (5 mg total) by mouth 2 (two) times daily. 60 tablet 0 12/31/2015 at Unknown time  . brimonidine (ALPHAGAN) 0.2 % ophthalmic solution Place 1 drop into both eyes daily.    12/31/2015 at Unknown time  . carvedilol (COREG) 12.5 MG tablet Take 12.5 mg by mouth 2 (two) times daily.   12/31/2015 at 1700  . Fluticasone-Salmeterol (ADVAIR DISKUS) 250-50 MCG/DOSE AEPB Inhale 1 puff into the lungs daily as needed (for wheezing or shortness).    Past Week at Unknown time  . furosemide (LASIX) 40 MG tablet Take 40 mg by mouth daily as needed for fluid or edema.    Past Week at Unknown time  . gabapentin (NEURONTIN) 300 MG capsule Take 300 mg by mouth at bedtime.   12/31/2015 at Unknown time  . insulin glargine (LANTUS) 100 UNIT/ML injection Inject 0.31 mLs (31 Units total) into the skin at bedtime. (Patient taking differently: Inject 62 Units into the skin at bedtime. ) 10 mL 0 12/31/2015 at Unknown time  .  ipratropium-albuterol (DUONEB) 0.5-2.5 (3) MG/3ML SOLN Take 3 mLs by nebulization every 6 (six) hours as needed (for SOB).     Marland Kitchen ketorolac (ACULAR) 0.5 % ophthalmic solution Place 1 drop into the right eye 4 (four) times daily. 5 mL 0   . latanoprost (XALATAN) 0.005 % ophthalmic solution Place 1 drop into both eyes at bedtime. Reported on 12/21/2015     . losartan (COZAAR) 100 MG tablet Take 100 mg by mouth daily.     . nateglinide (STARLIX) 120 MG tablet Take 120 mg by mouth daily.   11/15/2015 at Unknown time  . Omega-3 1000 MG CAPS Take 1 g by mouth daily.   11/15/2015 at Unknown time  . omeprazole (PRILOSEC) 20 MG capsule Take 20 mg by mouth 2 (two)  times daily.   11/15/2015 at am   . polyvinyl alcohol (LIQUIFILM TEARS) 1.4 % ophthalmic solution Place 1 drop into both eyes as needed for dry eyes. Reported on 12/21/2015     . potassium chloride SA (K-DUR,KLOR-CON) 20 MEQ tablet Take 20 mEq by mouth daily.   11/15/2015 at Unknown time  . promethazine (PHENERGAN) 25 MG tablet Take 12.5-25 mg by mouth every 4 (four) hours as needed.   prn  . simvastatin (ZOCOR) 40 MG tablet Take 40 mg by mouth every evening.   11/14/2015 at Unknown time  . SPIRIVA HANDIHALER 18 MCG inhalation capsule Place 1 application into inhaler and inhale daily.     Marland Kitchen terazosin (HYTRIN) 5 MG capsule Take 5 mg by mouth every evening.   11/14/2015 at Unknown time  . triamcinolone cream (KENALOG) 0.1 % Apply 1 application topically 2 (two) times daily as needed (for irritation).   Past Month at Unknown time  . trimethoprim-polymyxin b (POLYTRIM) ophthalmic solution Place 2 drops into the right eye every 6 (six) hours. 10 mL 0   . zolpidem (AMBIEN) 10 MG tablet Take 10 mg by mouth at bedtime as needed for sleep.    11/14/2015 at Unknown time  . clopidogrel (PLAVIX) 75 MG tablet Take 75 mg by mouth daily.   12/31/2015 at Unknown time    Physical Exam: Blood pressure 125/62, pulse 81, temperature 98.2 F (36.8 C), temperature source Oral, resp. rate 18, height  (1.702 m), weight 95.5 kg (210 lb 8 oz), SpO2 90 %.   Wt Readings from Last 1 Encounters:  02/10/16 95.5 kg (210 lb 8 oz)     General appearance: cooperative Resp: diminished breath sounds bilaterally Cardio: irregularly irregular rhythm Extremities: extremities normal, atraumatic, no cyanosis or edema Neurologic: Mental status: alertness: alert  Labs:   Lab Results  Component Value Date   WBC 4.9 02/10/2016   HGB 12.4 (L) 02/10/2016   HCT 35.9 (L) 02/10/2016   MCV 90.8 02/10/2016   PLT 140 (L) 02/10/2016    Recent Labs Lab 02/09/16 2244 02/10/16 0619  NA 139 139  K 3.6 3.6  CL 107 108  CO2 29 29  BUN  10 8  CREATININE 0.99 0.87  CALCIUM 8.6* 8.5*  PROT 5.9*  --   BILITOT 0.4  --   ALKPHOS 67  --   ALT 13*  --   AST 21  --   GLUCOSE 95 79   Lab Results  Component Value Date   CKTOTAL 76 06/05/2013   CKMB 0.9 06/05/2013   TROPONINI <0.03 02/10/2016      Radiology: minimal left basilar atelectasis.  EKG: afib with variable vr  ASSESSMENT AND PLAN:  Pt  with history of chornic afib trated with rate control with carvedilol and anticoagulated with eliquis, Was bradycardic aon admission but has improved with treatment of his hypoglycemia and holding carvedilol. Would contineu on eliquis and holding beta blocker. Has ruled out for an mi. Amualte in am with assistance.  Signed: [name] MD, [facility] 02/10/2016, 10:39 PM

## 2016-02-10 NOTE — Care Management (Addendum)
Admitted with hypoglycemia. Patient is active with the [facility]. Spoke with Selinda Michaels, they do have beds available for patient transfer. Spoke with patient. He refuses transfer. Refusal to transfer form completed and faxed with [employer] to [employer]. Patient states his PCP is at the [facility]. This is where he gets his medications. He lives alone. Uses a cane and a walker. Son lives about 50 yards from him and is supportive.  He uses home O2 at night but doesn't know the name of the company. Will monitor for home O2 atc need.

## 2016-02-10 NOTE — ED Notes (Signed)
Pt transported to room 254 

## 2016-02-10 NOTE — Progress Notes (Signed)
Per discussion with Dr. Amado Coe, will discontinue Clopidogrel (Plavix). Patient is on Apixaban 5mg  bid  (See Discharge note from 11/21/15, Dr. Melynda Ripple- plavix stopped, switched to eliquis 5mg  BID.)  Bari Mantis PharmD Clinical Pharmacist 02/10/2016

## 2016-02-10 NOTE — ED Notes (Signed)
Attempted to call report and was told that nurse was in another room and would call me back

## 2016-02-10 NOTE — Progress Notes (Signed)
CBG has mostly been in the 70's today, but is up to 115 currently. Orders for CBG monitoring have discontinued. MD paged and stated to check one more time at 1800 and if stable, order can be changed to Q4 hours. PT notified primary RN that patient is favoring one side and has a lot of dizziness when standing. Recommended MRI. MD ordered.

## 2016-02-10 NOTE — ED Notes (Signed)
CBG 163 

## 2016-02-10 NOTE — ED Notes (Addendum)
Pt cbg is decreasing every 30 mintues (checked per order) - Dr Scotty Court notified of readings - verbal order given for D5 @ 150/hr

## 2016-02-10 NOTE — ED Notes (Signed)
CBG 100 

## 2016-02-10 NOTE — Progress Notes (Signed)
Patient alert and oriented x 3. Some periods of dizziness and unsteadiness, vitals are stable. Blood glucose monitoring increased from 70's to upper 90's. Heart rate maintained high 60's. No complaints of pain, will continue to monitor.

## 2016-02-10 NOTE — ED Notes (Signed)
CBG 168. 

## 2016-02-10 NOTE — Progress Notes (Signed)
[facility] [place] [facility]   PATIENT NAME: Bobby Krueger    MR#:  [id number]  DATE OF BIRTH:  Jan 29, 1927  SUBJECTIVE:  CHIEF COMPLAINT:  Patient is resting comfortably today. Answering questions appropriately. Denies any dizziness or palpitations  REVIEW OF SYSTEMS:  CONSTITUTIONAL: No fever, fatigue or weakness.  EYES: No blurred or double vision.  EARS, NOSE, AND THROAT: No tinnitus or ear pain.  RESPIRATORY: No cough, shortness of breath, wheezing or hemoptysis.  CARDIOVASCULAR: No chest pain, orthopnea, edema.  GASTROINTESTINAL: No nausea, vomiting, diarrhea or abdominal pain.  GENITOURINARY: No dysuria, hematuria.  ENDOCRINE: No polyuria, nocturia,  HEMATOLOGY: No anemia, easy bruising or bleeding SKIN: No rash or lesion. MUSCULOSKELETAL: No joint pain or arthritis.   NEUROLOGIC: No tingling, numbness, weakness.  PSYCHIATRY: No anxiety or depression.   DRUG ALLERGIES:   Allergies  Allergen Reactions  . Aldactone [Spironolactone] Hives  . Aspirin Hives  . Ciprofloxacin Hives  . Cyclobenzaprine Hives  . Lexapro [Escitalopram] Hives    VITALS:  Blood pressure (!) 105/52, pulse (!) 59, temperature 97.5 F (36.4 C), temperature source Oral, resp. rate 20, height  (1.702 m), weight 95.5 kg (210 lb 8 oz), SpO2 100 %.  PHYSICAL EXAMINATION:  GENERAL:  82 y.o.-year-old patient lying in the bed with no acute distress.  EYES: Pupils equal, round, reactive to light and accommodation. No scleral icterus. Extraocular muscles intact.  HEENT: Head atraumatic, normocephalic. Oropharynx and nasopharynx clear.  NECK:  Supple, no jugular venous distention. No thyroid enlargement, no tenderness.  LUNGS: Normal breath sounds bilaterally, no wheezing, rales,rhonchi or crepitation. No use of accessory muscles of respiration.  CARDIOVASCULAR: Irregularly irregular. No murmurs, rubs, or gallops.  ABDOMEN: Soft, nontender, nondistended. Bowel sounds  present. No organomegaly or mass.  EXTREMITIES: No pedal edema, cyanosis, or clubbing.  NEUROLOGIC: Cranial nerves II through XII are intact. Muscle strength 5/5 in all extremities. Sensation intact. Gait not checked.  PSYCHIATRIC: The patient is alert and oriented x 3.  SKIN: No obvious rash, lesion, or ulcer.    LABORATORY PANEL:   CBC  Recent Labs Lab 02/10/16 0619  WBC 4.9  HGB 12.4*  HCT 35.9*  PLT 140*   ------------------------------------------------------------------------------------------------------------------  Chemistries   Recent Labs Lab 02/09/16 2244 02/10/16 0619  NA 139 139  K 3.6 3.6  CL 107 108  CO2 29 29  GLUCOSE 95 79  BUN 10 8  CREATININE 0.99 0.87  CALCIUM 8.6* 8.5*  AST 21  --   ALT 13*  --   ALKPHOS 67  --   BILITOT 0.4  --    ------------------------------------------------------------------------------------------------------------------  Cardiac Enzymes  Recent Labs Lab 02/10/16 0619  TROPONINI 0.03*   ------------------------------------------------------------------------------------------------------------------  RADIOLOGY:  Ct Head Wo Contrast  Result Date: 02/10/2016 CLINICAL DATA:  82 year old male with altered mental status EXAM: CT HEAD WITHOUT CONTRAST TECHNIQUE: Contiguous axial images were obtained from the base of the skull through the vertex without intravenous contrast. COMPARISON:  CT dated 11/16/2015 FINDINGS: The ventricles and sulci are appropriate in size for patient's age. Moderate periventricular and deep white matter chronic microvascular ischemic changes noted. There is no acute intracranial hemorrhage. No mass effect or midline shift noted. There is mild mucoperiosteal thickening of paranasal sinuses. No air-fluid levels. The mastoid air cells are clear. The calvarium is intact. IMPRESSION: No acute intracranial hemorrhage. Age-related atrophy and chronic microvascular ischemic disease. If symptoms persist and  there are no contraindications, MRI may provide better evaluation if clinically  indicated. Electronically Signed   By: Elgie Collard M.D.   On: 02/10/2016 03:17  Dg Chest Portable 1 View  Result Date: 02/09/2016 CLINICAL DATA:  Unresponsive EXAM: PORTABLE CHEST 1 VIEW COMPARISON:  02/07/2016 FINDINGS: Cardiac shadow remains enlarged. The lungs are again well aerated without focal infiltrate or sizable effusion. Minimal left basilar atelectasis is noted. Postsurgical changes in the cervical spine are seen. Aortic calcifications are again noted. IMPRESSION: Minimal left basilar atelectasis. Aortic atherosclerosis Electronically Signed   By: Alcide Clever M.D.   On: 02/09/2016 23:17   EKG:   Orders placed or performed during the hospital encounter of 02/07/16  . EKG 12-Lead  . EKG 12-Lead  . EKG 12-Lead  . EKG 12-Lead    ASSESSMENT AND PLAN:   82 year old elderly Caucasian male patient with history of type 2 diabetes mellitus insulin-dependent, hypertension, hyperlipidemia, COPD, congestive heart failure, atrial fibrillation presented to the emergency room with decreased responsiveness and low blood sugar along with low blood pressure.   1. Altered mental status secondary to hypoglycemia Resolved and back to his baseline at this time   2. Hypoglycemia with history of diabetes mellitus type 2-could be from an intentional drug overdose The patient is still hypoglycemic while on D5.  Continue Accu-Chek monitoring closely Hold  Lantus which is his home medicine Sliding scale if needed  3. Bradycardia-asymptomatic Avoid rate limiting drugs Echocardiogram and consult cardiology Dr. Lady Gary  4. Hypotension-rule out sepsis Blood cultures and urine cultures are negative so far. Patient is afebrile and white count is normal Provide IV fluids and monitor patient closely   5. COPD which is stable-inhalers as needed  6. Chronic history of atrial fibrillation  Rate controlled. Continue  eliquis     All the records are reviewed and case discussed with Care Management/Social Workerr. Management plans discussed with the patient, family and they are in agreement.  CODE STATUS: fc  TOTAL TIME TAKING CARE OF THIS PATIENT: 36  minutes.   POSSIBLE D/C IN 1-2  DAYS, DEPENDING ON CLINICAL CONDITION.  Note: This dictation was prepared with Dragon dictation along with smaller phrase technology. Any transcriptional errors that result from this process are unintentional.   Ramonita Lab M.D on 02/10/2016 at 12:15 PM  Between 7am to 6pm - Pager - [phone] After 6pm go to [email or web address] - password [email or web address] [facility]  [facility]  Office  [phone]  CC: Primary care physician; [facility]

## 2016-02-10 NOTE — Evaluation (Signed)
Physical Therapy Evaluation Patient Details Name: Bobby Krueger MRN: [id number] DOB: [birth year] Today's Date: 02/10/2016   History of Present Illness  presented to ER after episode of decreased responsiveness, dizziness and confusion; admitted for acute hospitalization secondary to acute hypoglycemia ( /dL in field, requiring IV D50).  Clinical Impression  Upon evaluation, patient alert and oriented; follows all commands and demonstrates good effort/participation with all PT interventions.  Bilat UE/LE strength and ROM grossly WFL for basic transfers and mobility; mild weakness noted in L UE/LE at times.  Denies sensory deficit.  Poor truncal stability noted with movement transitions, requiring hands-on assist from therapist at all times. Patient reporting significant dizziness with transition to upright during evaluation.  Orthostatics negative.  Vestibular assessment significant for horizontal nystagmus and mild (inconsistent) oscillations with saccades.  May benefit from additional work-up to rule out central cause of dizziness/vestibular findings if symptoms persist.  RN informed/aware and to communicate to MD. Would benefit from skilled PT to address above deficits and promote optimal return to PLOF; recommend transition to STR upon discharge from acute hospitalization, as patient unsafe/unable to return home alone at this time.     Follow Up Recommendations SNF    Equipment Recommendations       Recommendations for Other Services       Precautions / Restrictions Precautions Precautions: Fall Restrictions Weight Bearing Restrictions: No      Mobility  Bed Mobility Overal bed mobility: Needs Assistance Bed Mobility: Supine to Sit;Sit to Supine     Supine to sit: Supervision Sit to supine: Supervision   General bed mobility comments: heavy use of bedrails  Transfers Overall transfer level: Needs assistance Equipment used: Rolling walker (2 wheeled) Transfers: Sit  to/from Stand Sit to Stand: Min assist;Mod assist         General transfer comment: reported dizziness with transition to upright; poor truncal stability/increased sway requiring therapist assist to prevent LOB  Ambulation/Gait Ambulation/Gait assistance: Min assist;Mod assist Ambulation Distance (Feet): 5 Feet Assistive device: Rolling walker (2 wheeled)       General Gait Details: narrowed BOS with decreased step height/length bilat; mild L lateral lean.  Poor safety/stability. Unsafe to attempt without RW and +1; unable to tolerate beyond bed/chair due to dizziness this date  Stairs            Wheelchair Mobility    Modified Rankin (Stroke Patients Only)       Balance Overall balance assessment: Needs assistance Sitting-balance support: Feet supported;No upper extremity supported Sitting balance-Leahy Scale: Good     Standing balance support: Bilateral upper extremity supported Standing balance-Leahy Scale: Poor                               Pertinent Vitals/Pain Pain Assessment: No/denies pain    Home Living Family/patient expects to be discharged to:: Private residence Living Arrangements: Alone Available Help at Discharge: Family;Available PRN/intermittently Type of Home: House Home Access: Stairs to enter Entrance Stairs-Rails: Right Entrance Stairs-Number of Steps: 3 Home Layout: One level Home Equipment: Cane - single point;Walker - 2 wheels;Walker - 4 wheels;Shower seat - built in;Grab bars - tub/shower Additional Comments: Son lives close by and provides daily check in    Prior Function Level of Independence: Independent with assistive device(s)         Comments: Mod indep with use of SPC vs. RW (primarily SPC).  Denies fall history within previous six months.  Hand Dominance        Extremity/Trunk Assessment   Upper Extremity Assessment: Overall WFL for tasks assessed (question mild strength deficit in L UE. No  significant dysmetria, coordination deficits.  Sensation intact)           Lower Extremity Assessment: Overall WFL for tasks assessed (Mild weakness L LE (4/5) compared to R LE (5/5).  Negative babinski, negative clonus, negative sensory deficit.)         Communication   Communication: HOH  Cognition Arousal/Alertness: Awake/alert Behavior During Therapy: WFL for tasks assessed/performed Overall Cognitive Status: Within Functional Limits for tasks assessed                      General Comments      Exercises Other Exercises Other Exercises: Vestibular assessment: VOR intact; mild 1-2 beat nystagmus with horizontal gaze testing (no vertical nystagmus); no significant skew noted.  Intact smooth pursuit; inconsistent saccades.      Assessment/Plan    PT Assessment Patient needs continued PT services  PT Diagnosis Difficulty walking;Generalized weakness   PT Problem List Decreased strength;Decreased activity tolerance;Decreased balance;Decreased mobility;Decreased coordination;Decreased knowledge of use of DME;Decreased safety awareness;Decreased knowledge of precautions  PT Treatment Interventions DME instruction;Gait training;Stair training;Functional mobility training;Therapeutic activities;Therapeutic exercise;Balance training;Patient/family education   PT Goals (Current goals can be found in the Care Plan section) Acute Rehab PT Goals Patient Stated Goal: to do whatever you think I need to do PT Goal Formulation: With patient Time For Goal Achievement: 02/24/16 Potential to Achieve Goals: Good    Frequency Min 2X/week   Barriers to discharge Decreased caregiver support      Co-evaluation               End of Session Equipment Utilized During Treatment: Gait belt Activity Tolerance:  (limited by dizziness) Patient left: in chair;with call bell/phone within reach;with chair alarm set Nurse Communication: Mobility status         Time:  5681-2751 PT Time Calculation (min) (ACUTE ONLY): 41 min   Charges:   PT Evaluation $PT Eval Moderate Complexity: 1 Procedure PT Treatments $Therapeutic Activity: 8-22 mins   PT G Codes:        Malaak Stach H. Manson Passey, PT, DPT, NCS 02/10/16, 4:47 PM [phone]

## 2016-02-11 ENCOUNTER — Inpatient Hospital Stay: Payer: Non-veteran care

## 2016-02-11 ENCOUNTER — Ambulatory Visit: Payer: Medicare Other | Admitting: Urology

## 2016-02-11 LAB — URINE CULTURE: Culture: NO GROWTH

## 2016-02-11 LAB — GLUCOSE, CAPILLARY
GLUCOSE-CAPILLARY: 79 mg/dL (ref 65–99)
GLUCOSE-CAPILLARY: 147 mg/dL — AB (ref 65–99)
GLUCOSE-CAPILLARY: 141 mg/dL — AB (ref 65–99)
Glucose-Capillary: 111 mg/dL — ABNORMAL HIGH (ref 65–99)
Glucose-Capillary: 120 mg/dL — ABNORMAL HIGH (ref 65–99)
Glucose-Capillary: 120 mg/dL — ABNORMAL HIGH (ref 65–99)
Glucose-Capillary: 245 mg/dL — ABNORMAL HIGH (ref 65–99)

## 2016-02-11 MED ORDER — PREDNISONE 20 MG PO TABS
20.0000 mg | ORAL_TABLET | Freq: Every day | ORAL | Status: DC
  Administered 2016-02-11 – 2016-02-12 (×2): 20 mg via ORAL
  Filled 2016-02-11 (×2): qty 1

## 2016-02-11 MED ORDER — POLYETHYLENE GLYCOL 3350 17 G PO PACK
17.0000 g | PACK | Freq: Every day | ORAL | Status: DC
  Administered 2016-02-11 – 2016-02-14 (×4): 17 g via ORAL
  Filled 2016-02-11 (×4): qty 1

## 2016-02-11 NOTE — Clinical Social Work Note (Signed)
Clinical Social Work Assessment  Patient Details  Name: Bobby Krueger MRN: [id number] Date of Birth: 28-Oct-1926  Date of referral:  02/11/16               Reason for consult:  Discharge Planning                Permission sought to share information with:  Family Supports Permission granted to share information::  Yes, Verbal Permission Granted  Name::        Agency::     Relationship::   [name] - Son)  Contact Information:     Housing/Transportation Living arrangements for the past 2 months:  Apartment Source of Information:  Patient, Adult Children [name] - Son) Patient Interpreter Needed:  None Criminal Activity/Legal Involvement Pertinent to Current Situation/Hospitalization:    Significant Relationships:  Adult Children, Neighbor Lives with:  Self Do you feel safe going back to the place where you live?  Yes Need for family participation in patient care:  Yes (Comment) [name] - Son)  Care giving concerns:  Patient reports that he does not feel that he can return home at discharge.    Social Worker assessment / plan:  CSW met with patient and his sonFrankey Poot at bedside. Granted CSW verbal permission to discuss discharge plans in front of his son [name]. CSW introduced herself and her role. Patient's so reports that he lives 500 feet away from patient. He stated that he does not feel patient can return home. Reported that patient is weak and that he fears that he may die if he returns home in this current condition. CSW discussed PT's recommendation with patient and his son. Patient and his son are agreeable for SNF placement at discharge. Granted CSW verbal permission to send SNF referral. FL2/ PASRR completed and placed in MD's basket for cosign. Referral sent to SNFs in [facility]. Awaiting bed offers.   Employment status:  Retired Forensic scientist:  Medicare PT Recommendations:  [place] / Referral to community resources:  [place]  Patient/Family's Response to care:  Patient is in agreement with going to a SNF at discharge.   Patient/Family's Understanding of and Emotional Response to Diagnosis, Current Treatment, and Prognosis:  Patient and his son reported the understood patient's Diagnosis, Current Treatment, and Prognosis. Reported they feel that SNF is the best option for patient until he can get his strength back. Thanked CSW for her assistance.   Emotional Assessment Appearance:  Appears stated age Attitude/Demeanor/Rapport:   (None) Affect (typically observed):  Accepting, Calm, Pleasant Orientation:  Oriented to Self, Oriented to Place, Oriented to  Time, Oriented to Situation Alcohol / Substance use:  Not Applicable Psych involvement (Current and /or in the community):  No (Comment)  Discharge Needs  Concerns to be addressed:  Discharge Planning Concerns Readmission within the last 30 days:  No Current discharge risk:  Chronically ill Barriers to Discharge:  Continued Medical Work up   [employer], LCSW 02/11/2016, 11:40 AM

## 2016-02-11 NOTE — Progress Notes (Deleted)
   02/11/2016 12:39 PM   Bobby Krueger [birth year] [id number]  Referring provider: [facility] [address]  [place], [place] [address]  No chief complaint on file.   HPI:   PMH: Past Medical History:  Diagnosis Date  . A-fib (HCC)   . CHF (congestive heart failure) (HCC)   . COPD (chronic obstructive pulmonary disease) (HCC)   . Diabetes mellitus without complication (HCC)   . High cholesterol   . Hypertension     Surgical History: Past Surgical History:  Procedure Laterality Date  . BACK SURGERY    . CARPAL TUNNEL RELEASE    . CHOLECYSTECTOMY      Home Medications:    Medication List    Notice   This visit is during an admission. Changes to the med list made in this visit will be reflected in the After Visit Summary of the admission.     Allergies:  Allergies  Allergen Reactions  . Aldactone [Spironolactone] Hives  . Aspirin Hives  . Ciprofloxacin Hives  . Cyclobenzaprine Hives  . Lexapro [Escitalopram] Hives    Family History: Family History  Problem Relation Age of Onset  . Prostate cancer      Social History:  reports that he has quit smoking. He has never used smokeless tobacco. He reports that he does not drink alcohol or use drugs.  ROS:                                        Physical Exam: There were no vitals taken for this visit.  Constitutional:  Alert and oriented, No acute distress. HEENT: [place] AT, moist mucus membranes.  Trachea midline, no masses. Cardiovascular: No clubbing, cyanosis, or edema. Respiratory: Normal respiratory effort, no increased work of breathing. GI: Abdomen is soft, nontender, nondistended, no abdominal masses GU: No CVA tenderness. *** Skin: No rashes, bruises or suspicious lesions. Lymph: No cervical or inguinal adenopathy. Neurologic: Grossly intact, no focal deficits, moving all 4 extremities. Psychiatric: Normal mood and affect.  Laboratory Data: Lab Results    Component Value Date   WBC 4.9 02/10/2016   HGB 12.4 (L) 02/10/2016   HCT 35.9 (L) 02/10/2016   MCV 90.8 02/10/2016   PLT 140 (L) 02/10/2016    Lab Results  Component Value Date   CREATININE 0.87 02/10/2016    No results found for: PSA  No results found for: TESTOSTERONE  No results found for: HGBA1C  Urinalysis    Component Value Date/Time   COLORURINE YELLOW (A) 02/09/2016 2240   APPEARANCEUR CLEAR (A) 02/09/2016 2240   APPEARANCEUR Clear 06/05/2013 1502   LABSPEC 1.014 02/09/2016 2240   LABSPEC 1.012 06/05/2013 1502   PHURINE 6.0 02/09/2016 2240   GLUCOSEU NEGATIVE 02/09/2016 2240   GLUCOSEU 50 mg/dL 06/09/6243 6950   HGBUR 1+ (A) 02/09/2016 2240   BILIRUBINUR NEGATIVE 02/09/2016 2240   BILIRUBINUR Negative 06/05/2013 1502   KETONESUR NEGATIVE 02/09/2016 2240   PROTEINUR 100 (A) 02/09/2016 2240   NITRITE NEGATIVE 02/09/2016 2240   LEUKOCYTESUR NEGATIVE 02/09/2016 2240   LEUKOCYTESUR Negative 06/05/2013 1502    Pertinent Imaging: ***  Assessment & Plan:  ***  There are no diagnoses linked to this encounter.  No Follow-up on file.  Vanna Scotland, MD  [facility] [address] [place], [place] [address] [phone]

## 2016-02-11 NOTE — Progress Notes (Signed)
Notified by CCMD that patient had a 21 beat run of vtach. Dr. Renae Gloss on floor and notified. MD stated he had just palpated the patient's chest at the time. No new orders. Fluids have been discontinued and current CBG is 147.

## 2016-02-11 NOTE — Progress Notes (Signed)
Patient was to have an MRI of the brain today. Informed patient about test a few times today and patient voiced no concerns and stated it would be fine and he was okay to get in the machine. MRI has now called and stated patient refused to have the test done. Dr. Renae Gloss text paged and notified.

## 2016-02-11 NOTE — Progress Notes (Signed)
Patient has been alert and oriented. Vitals are stable. Patient has stated he doesn't feel well all day. His nausea has improved, but he states he "just feels tired". Updated one of the patient's daughters and she stated the patient has been having dizziness and some confusion for the past few months now. CBG's are stable and patient is now off IV fluids.

## 2016-02-11 NOTE — [place] FL2 (Signed)
[place] MEDICAID FL2 LEVEL OF CARE SCREENING TOOL     IDENTIFICATION  Patient Name: Bobby Krueger Birthdate: 19-Jul-1927 Sex: male Admission Date (Current Location): 02/09/2016  [place] and [place] Number:  Chiropodist and Address:  [facility], [address] [place], [place] [address]      Provider Number: [id number]  Attending Physician Name and Address:  Alford Highland, MD  Relative Name and Phone Number:       Current Level of Care: Hospital Recommended Level of Care: Skilled Nursing Facility Prior Approval Number:    Date Approved/Denied:   PASRR Number:  [id number])  Discharge Plan: SNF    Current Diagnoses: Patient Active Problem List   Diagnosis Date Noted  . Hypoglycemia 02/10/2016  . Bradycardia 02/10/2016  . Generalized abdominal pain   . Atrial fibrillation (HCC) 11/19/2015  . AKI (acute kidney injury) (HCC) 11/16/2015  . Dehydration 11/16/2015  . Leukocytosis 11/16/2015  . CAD (coronary artery disease) 11/16/2015  . COPD (chronic obstructive pulmonary disease) (HCC) 11/16/2015  . GERD (gastroesophageal reflux disease) 11/16/2015  . Dyspnea 11/16/2015    Orientation RESPIRATION BLADDER Height & Weight     Self, Time, Situation, Place  O2 (Nasal Cannula 2 (L/min) ) Continent Weight: 210 lb 8 oz (95.5 kg) Height:   (170.2 cm)  BEHAVIORAL SYMPTOMS/MOOD NEUROLOGICAL BOWEL NUTRITION STATUS   (None)  (None) Continent Diet (Regular )  AMBULATORY STATUS COMMUNICATION OF NEEDS Skin   Extensive Assist Verbally Normal                       Personal Care Assistance Level of Assistance  Bathing, Feeding, Dressing Bathing Assistance: Limited assistance Feeding assistance: Independent Dressing Assistance: Limited assistance     Functional Limitations Info  Sight, Hearing, Speech Sight Info: Impaired Hearing Info: Impaired (Right- Ear) Speech Info: Adequate    SPECIAL CARE FACTORS FREQUENCY  PT (By  licensed PT), OT (By licensed OT)     PT Frequency:  (5) OT Frequency:  (5)            Contractures      Additional Factors Info  Code Status, Allergies Code Status Info:  (Full Code) Allergies Info:  (Aldactone Spironolactone, Aspirin, Ciprofloxacin, Cyclobenzaprine, Lexapro Escitalopram)           Current Medications (02/11/2016):  This is the current hospital active medication list Current Facility-Administered Medications  Medication Dose Route Frequency Provider Last Rate Last Dose  . acetaminophen (TYLENOL) tablet 650 mg  650 mg Oral Q6H PRN Ihor Austin, MD       Or  . acetaminophen (TYLENOL) suppository 650 mg  650 mg Rectal Q6H PRN Pavan Pyreddy, MD      . albuterol (PROVENTIL) (2.5 MG/3ML) 0.083% nebulizer solution 3 mL  3 mL Inhalation Q4H PRN Pavan Pyreddy, MD      . apixaban (ELIQUIS) tablet 5 mg  5 mg Oral BID Ihor Austin, MD   5 mg at 02/11/16 1004  . brimonidine (ALPHAGAN) 0.2 % ophthalmic solution 1 drop  1 drop Both Eyes Daily Ihor Austin, MD   1 drop at 02/11/16 1005  . ipratropium-albuterol (DUONEB) 0.5-2.5 (3) MG/3ML nebulizer solution 3 mL  3 mL Nebulization Q6H PRN Pavan Pyreddy, MD      . ketorolac (ACULAR) 0.5 % ophthalmic solution 1 drop  1 drop Right Eye QID Ihor Austin, MD   1 drop at 02/11/16 1004  . latanoprost (XALATAN) 0.005 % ophthalmic solution 1  drop  1 drop Both Eyes QHS Ihor Austin, MD   1 drop at 02/10/16 2116  . mometasone-formoterol (DULERA) 200-5 MCG/ACT inhaler 2 puff  2 puff Inhalation BID Ihor Austin, MD   2 puff at 02/11/16 1005  . omega-3 acid ethyl esters (LOVAZA) capsule 1 g  1 g Oral Daily Pavan Pyreddy, MD   1 g at 02/11/16 1004  . ondansetron (ZOFRAN) tablet 4 mg  4 mg Oral Q6H PRN Ihor Austin, MD       Or  . ondansetron (ZOFRAN) injection 4 mg  4 mg Intravenous Q6H PRN Ihor Austin, MD   4 mg at 02/11/16 0809  . pantoprazole (PROTONIX) EC tablet 40 mg  40 mg Oral Daily Ihor Austin, MD   40 mg at 02/11/16 1004  .  polyethylene glycol (MIRALAX / GLYCOLAX) packet 17 g  17 g Oral Daily Alford Highland, MD      . polyvinyl alcohol (LIQUIFILM TEARS) 1.4 % ophthalmic solution 1 drop  1 drop Both Eyes PRN Pavan Pyreddy, MD      . predniSONE (DELTASONE) tablet 20 mg  20 mg Oral Q breakfast Alford Highland, MD      . senna-docusate (Senokot-S) tablet 1 tablet  1 tablet Oral QHS PRN Ihor Austin, MD      . simvastatin (ZOCOR) tablet 40 mg  40 mg Oral QPM Ihor Austin, MD   40 mg at 02/10/16 1753  . sodium chloride flush (NS) 0.9 % injection 3 mL  3 mL Intravenous Q12H Pavan Pyreddy, MD   3 mL at 02/11/16 1000  . terazosin (HYTRIN) capsule 5 mg  5 mg Oral QPM Ihor Austin, MD   5 mg at 02/10/16 1753  . tiotropium (SPIRIVA) inhalation capsule 18 mcg  18 mcg Inhalation Daily Ihor Austin, MD   18 mcg at 02/11/16 1005  . triamcinolone cream (KENALOG) 0.1 % 1 application  1 application Topical BID PRN Ihor Austin, MD      . trimethoprim-polymyxin b (POLYTRIM) ophthalmic solution 2 drop  2 drop Right Eye Q6H Ihor Austin, MD   2 drop at 02/11/16 0600     Discharge Medications: Please see discharge summary for a list of discharge medications.  Relevant Imaging Results:  Relevant Lab Results:   Additional Information  (SSN [id number])  Verta Ellen Myrna Vonseggern, LCSW

## 2016-02-11 NOTE — Progress Notes (Signed)
Patient ID: Bobby Krueger, male   DOB: Feb 21, 1927, 82 y.o.   MRN: [id number]  [employer] PROGRESS NOTE  Bobby Krueger:[id number] DOB: 11-Mar-1927 DOA: 02/09/2016 PCP: [name]., MD  HPI/Subjective: Patient this morning complaining of left leg weakness but it was specified to be more right toe pain. Patient refused MRI today. Feels a little dizzy and weak.  Objective: Vitals:   02/11/16 1003 02/11/16 1200  BP: (!) 148/68 (!) 157/74  Pulse: 81 73  Resp: 18 (!) 21  Temp:  97.9 F (36.6 C)    Filed Weights   02/09/16 2230 02/10/16 0621  Weight: 91.8 kg (202 lb 4.8 oz) 95.5 kg (210 lb 8 oz)    ROS: Review of Systems  Constitutional: Negative for chills and fever.  Eyes: Negative for blurred vision.  Respiratory: Negative for cough and shortness of breath.   Cardiovascular: Negative for chest pain.  Gastrointestinal: Positive for constipation. Negative for abdominal pain, diarrhea, nausea and vomiting.  Genitourinary: Negative for dysuria.  Musculoskeletal: Positive for joint pain.  Neurological: Positive for weakness. Negative for dizziness and headaches.   Exam: Physical Exam  HENT:  Nose: No mucosal edema.  Mouth/Throat: No oropharyngeal exudate or posterior oropharyngeal edema.  Eyes: Conjunctivae, EOM and lids are normal. Pupils are equal, round, and reactive to light.  Neck: No JVD present. Carotid bruit is not present. No edema present. No thyroid mass and no thyromegaly present.  Cardiovascular: S1 normal and S2 normal.  Exam reveals no gallop.   No murmur heard. Pulses:      Dorsalis pedis pulses are 2+ on the right side, and 2+ on the left side.  Respiratory: No respiratory distress. He has no wheezes. He has no rhonchi. He has no rales.  GI: Soft. Bowel sounds are normal. There is no tenderness.  Musculoskeletal:       Right ankle: He exhibits no swelling.       Left ankle: He exhibits no swelling.  Right first toe pain to movement and palpation   Lymphadenopathy:    He has no cervical adenopathy.  Neurological: He is alert. No cranial nerve deficit.  Skin: Skin is warm. No rash noted. Nails show no clubbing.  Psychiatric: He has a normal mood and affect.      Data Reviewed: Basic Metabolic Panel:  Recent Labs Lab 02/05/16 1519 02/07/16 0403 02/09/16 2244 02/10/16 0619  NA 138 140 139 139  K 3.9 3.8 3.6 3.6  CL 103 105 107 108  CO2 28 29 29 29   GLUCOSE 95 128* 95 79  BUN 10 13 10 8   CREATININE 0.78 1.08 0.99 0.87  CALCIUM 9.4 9.4 8.6* 8.5*   Liver Function Tests:  Recent Labs Lab 02/05/16 1519 02/07/16 0403 02/09/16 2244  AST 20 19 21   ALT 12* 12* 13*  ALKPHOS 89 90 67  BILITOT 0.7 0.8 0.4  PROT 7.3 7.3 5.9*  ALBUMIN 3.8 3.9 3.3*    Recent Labs Lab 02/05/16 1519 02/07/16 0403  LIPASE 13 14   CBC:  Recent Labs Lab 02/05/16 1519 02/07/16 0403 02/09/16 2244 02/10/16 0619  WBC 7.1 7.4 5.8 4.9  NEUTROABS  --  4.1  --   --   HGB 13.8 14.5 12.1* 12.4*  HCT 39.9* 41.8 34.5* 35.9*  MCV 89.1 89.7 89.4 90.8  PLT 160 166 173 140*   Cardiac Enzymes:  Recent Labs Lab 02/07/16 0744 02/09/16 2244 02/10/16 0619 02/10/16 1237 02/10/16 1759  TROPONINI 0.04* 0.04* 0.03* 0.03* <0.03  BNP (last 3 results)  Recent Labs  11/15/15 2107  BNP 42.7    CBG:  Recent Labs Lab 02/11/16 0005 02/11/16 0403 02/11/16 0716 02/11/16 1109 02/11/16 1201  GLUCAP 120* 79 111* 147* 141*    Recent Results (from the past 240 hour(s))  Blood culture (routine x 2)     Status: None (Preliminary result)   Collection Time: 02/09/16 11:04 PM  Result Value Ref Range Status   Specimen Description BLOOD LEFT FATTY CASTS  Final   Special Requests BOTTLES DRAWN AEROBIC AND ANAEROBIC  10AERO,10ANA  Final   Culture NO GROWTH 1 DAY  Final   Report Status PENDING  Incomplete  Urine culture     Status: None   Collection Time: 02/09/16 11:04 PM  Result Value Ref Range Status   Specimen Description URINE, RANDOM   Final   Special Requests NONE  Final   Culture NO GROWTH Performed at [facility]   Final   Report Status 02/11/2016 FINAL  Final  Blood culture (routine x 2)     Status: None (Preliminary result)   Collection Time: 02/09/16 11:05 PM  Result Value Ref Range Status   Specimen Description BLOOD RIGHT ASSIST CONTROL  Final   Special Requests BOTTLES DRAWN AEROBIC AND ANAEROBIC  10AERO, 10ANA  Final   Culture NO GROWTH 1 DAY  Final   Report Status PENDING  Incomplete     Studies: Ct Head Wo Contrast  Result Date: 02/10/2016 CLINICAL DATA:  82 year old male with altered mental status EXAM: CT HEAD WITHOUT CONTRAST TECHNIQUE: Contiguous axial images were obtained from the base of the skull through the vertex without intravenous contrast. COMPARISON:  CT dated 11/16/2015 FINDINGS: The ventricles and sulci are appropriate in size for patient's age. Moderate periventricular and deep white matter chronic microvascular ischemic changes noted. There is no acute intracranial hemorrhage. No mass effect or midline shift noted. There is mild mucoperiosteal thickening of paranasal sinuses. No air-fluid levels. The mastoid air cells are clear. The calvarium is intact. IMPRESSION: No acute intracranial hemorrhage. Age-related atrophy and chronic microvascular ischemic disease. If symptoms persist and there are no contraindications, MRI may provide better evaluation if clinically indicated. Electronically Signed   By: Elgie Collard M.D.   On: 02/10/2016 03:17  Dg Chest Portable 1 View  Result Date: 02/09/2016 CLINICAL DATA:  Unresponsive EXAM: PORTABLE CHEST 1 VIEW COMPARISON:  02/07/2016 FINDINGS: Cardiac shadow remains enlarged. The lungs are again well aerated without focal infiltrate or sizable effusion. Minimal left basilar atelectasis is noted. Postsurgical changes in the cervical spine are seen. Aortic calcifications are again noted. IMPRESSION: Minimal left basilar atelectasis. Aortic  atherosclerosis Electronically Signed   By: Alcide Clever M.D.   On: 02/09/2016 23:17   Scheduled Meds: . apixaban  5 mg Oral BID  . brimonidine  1 drop Both Eyes Daily  . ketorolac  1 drop Right Eye QID  . latanoprost  1 drop Both Eyes QHS  . mometasone-formoterol  2 puff Inhalation BID  . omega-3 acid ethyl esters  1 g Oral Daily  . pantoprazole  40 mg Oral Daily  . polyethylene glycol  17 g Oral Daily  . predniSONE  20 mg Oral Q breakfast  . simvastatin  40 mg Oral QPM  . sodium chloride flush  3 mL Intravenous Q12H  . terazosin  5 mg Oral QPM  . tiotropium  18 mcg Inhalation Daily  . trimethoprim-polymyxin b  2 drop Right Eye Q6H    Assessment/Plan:  1. Hypoglycemia with history of diabetes. I added on a hemoglobin A1c which is currently pending. Stop D5 drip. Holding all diabetic medications. 2. Altered mental status likely secondary to hypoglycemia. 3. Subjective right leg weakness. Patient refused MRI of the brain will repeat a CAT scan of the head. 4. Bradycardia asymptomatic. Off beta blocker. 5. Hypotension on presentation. Now hypertensive. 6. Patient on numerous eyedrops. 7. Chronic atrial fibrillation on eliquis 8. Weakness. Physical therapy recommended rehabilitation  Code Status:     Code Status Orders        Start     Ordered   02/10/16 0604  Full code  Continuous     02/10/16 0603    Code Status History    Date Active Date Inactive Code Status Order ID Comments User Context   11/16/2015  5:20 AM 11/21/2015  7:37 PM Full Code [id number]  Clydie Braun, MD ED     Family Communication: Spoke with son on the phone Disposition Plan: Out to rehabilitation in the next day or so  Consultants:  Cardiology  Time spent: 25 minutes  Alford Highland  [employer]

## 2016-02-12 LAB — GLUCOSE, CAPILLARY
GLUCOSE-CAPILLARY: 116 mg/dL — AB (ref 65–99)
GLUCOSE-CAPILLARY: 165 mg/dL — AB (ref 65–99)
Glucose-Capillary: 135 mg/dL — ABNORMAL HIGH (ref 65–99)
Glucose-Capillary: 190 mg/dL — ABNORMAL HIGH (ref 65–99)
Glucose-Capillary: 230 mg/dL — ABNORMAL HIGH (ref 65–99)
Glucose-Capillary: 267 mg/dL — ABNORMAL HIGH (ref 65–99)

## 2016-02-12 LAB — HEMOGLOBIN A1C: Hgb A1c MFr Bld: 7.4 % — ABNORMAL HIGH (ref 4.0–6.0)

## 2016-02-12 MED ORDER — LACTULOSE 10 GM/15ML PO SOLN
30.0000 g | Freq: Once | ORAL | Status: AC
  Administered 2016-02-12: 30 g via ORAL
  Filled 2016-02-12: qty 60

## 2016-02-12 MED ORDER — INSULIN ASPART 100 UNIT/ML ~~LOC~~ SOLN
0.0000 [IU] | Freq: Three times a day (TID) | SUBCUTANEOUS | Status: DC
  Administered 2016-02-12: 2 [IU] via SUBCUTANEOUS
  Administered 2016-02-12: 3 [IU] via SUBCUTANEOUS
  Administered 2016-02-13: 7 [IU] via SUBCUTANEOUS
  Administered 2016-02-13: 1 [IU] via SUBCUTANEOUS
  Administered 2016-02-13 – 2016-02-14 (×3): 2 [IU] via SUBCUTANEOUS
  Filled 2016-02-12: qty 3
  Filled 2016-02-12: qty 2
  Filled 2016-02-12: qty 1
  Filled 2016-02-12: qty 2
  Filled 2016-02-12: qty 7
  Filled 2016-02-12: qty 2
  Filled 2016-02-12: qty 3

## 2016-02-12 MED ORDER — INSULIN ASPART 100 UNIT/ML ~~LOC~~ SOLN
0.0000 [IU] | Freq: Every day | SUBCUTANEOUS | Status: DC
  Administered 2016-02-12 – 2016-02-13 (×2): 3 [IU] via SUBCUTANEOUS
  Filled 2016-02-12 (×2): qty 3

## 2016-02-12 MED ORDER — METOCLOPRAMIDE HCL 10 MG PO TABS
5.0000 mg | ORAL_TABLET | Freq: Three times a day (TID) | ORAL | Status: DC
  Administered 2016-02-12 – 2016-02-14 (×9): 5 mg via ORAL
  Filled 2016-02-12 (×2): qty 1
  Filled 2016-02-12: qty 2
  Filled 2016-02-12 (×8): qty 1

## 2016-02-12 NOTE — Progress Notes (Signed)
Patient ID: Bobby Krueger, male   DOB: [birth year], 82 y.o.   MRN: [id number]  [employer] PROGRESS NOTE  BODHI MORADI WUJ:[id number] DOB: 11-May-1927 DOA: 02/09/2016 PCP: [name]., MD  HPI/Subjective: Patient still with nausea and poor appetite. Still has not had a bowel movement. Right toe pain is a little bit better today.patient still feeling very weak.  Objective: Vitals:   02/12/16 0440 02/12/16 0842  BP: 138/60 (!) 142/62  Pulse: 77 74  Resp: 18 18  Temp: 98.2 F (36.8 C) 98.2 F (36.8 C)    Filed Weights   02/09/16 2230 02/10/16 0621  Weight: 91.8 kg (202 lb 4.8 oz) 95.5 kg (210 lb 8 oz)    ROS: Review of Systems  Constitutional: Negative for chills and fever.  Eyes: Negative for blurred vision.  Respiratory: Negative for cough and shortness of breath.   Cardiovascular: Negative for chest pain.  Gastrointestinal: Positive for constipation and nausea. Negative for abdominal pain, diarrhea and vomiting.  Genitourinary: Negative for dysuria.  Musculoskeletal: Positive for joint pain.  Neurological: Positive for weakness. Negative for dizziness and headaches.   Exam: Physical Exam  HENT:  Nose: No mucosal edema.  Mouth/Throat: No oropharyngeal exudate or posterior oropharyngeal edema.  Eyes: Conjunctivae, EOM and lids are normal. Pupils are equal, round, and reactive to light.  Neck: No JVD present. Carotid bruit is not present. No edema present. No thyroid mass and no thyromegaly present.  Cardiovascular: S1 normal and S2 normal.  Exam reveals no gallop.   No murmur heard. Pulses:      Dorsalis pedis pulses are 2+ on the right side, and 2+ on the left side.  Respiratory: No respiratory distress. He has no wheezes. He has no rhonchi. He has no rales.  GI: Soft. Bowel sounds are normal. There is no tenderness.  Musculoskeletal:       Right ankle: He exhibits no swelling.       Left ankle: He exhibits no swelling.  Right first toe pain to movement and  palpation  Lymphadenopathy:    He has no cervical adenopathy.  Neurological: He is alert. No cranial nerve deficit.  Skin: Skin is warm. No rash noted. Nails show no clubbing.  Psychiatric: He has a normal mood and affect.      Data Reviewed: Basic Metabolic Panel:  Recent Labs Lab 02/05/16 1519 02/07/16 0403 02/09/16 2244 02/10/16 0619  NA 138 140 139 139  K 3.9 3.8 3.6 3.6  CL 103 105 107 108  CO2 28 29 29 29   GLUCOSE 95 128* 95 79  BUN 10 13 10 8   CREATININE 0.78 1.08 0.99 0.87  CALCIUM 9.4 9.4 8.6* 8.5*   Liver Function Tests:  Recent Labs Lab 02/05/16 1519 02/07/16 0403 02/09/16 2244  AST 20 19 21   ALT 12* 12* 13*  ALKPHOS 89 90 67  BILITOT 0.7 0.8 0.4  PROT 7.3 7.3 5.9*  ALBUMIN 3.8 3.9 3.3*    Recent Labs Lab 02/05/16 1519 02/07/16 0403  LIPASE 13 14   CBC:  Recent Labs Lab 02/05/16 1519 02/07/16 0403 02/09/16 2244 02/10/16 0619  WBC 7.1 7.4 5.8 4.9  NEUTROABS  --  4.1  --   --   HGB 13.8 14.5 12.1* 12.4*  HCT 39.9* 41.8 34.5* 35.9*  MCV 89.1 89.7 89.4 90.8  PLT 160 166 173 140*   Cardiac Enzymes:  Recent Labs Lab 02/07/16 0744 02/09/16 2244 02/10/16 0619 02/10/16 1237 02/10/16 1759  TROPONINI 0.04* 0.04* 0.03* 0.03* <  0.03   BNP (last 3 results)  Recent Labs  11/15/15 2107  BNP 42.7    CBG:  Recent Labs Lab 02/11/16 1627 02/11/16 1958 02/12/16 0045 02/12/16 0441 02/12/16 0729  GLUCAP 120* 245* 190* 135* 116*    Recent Results (from the past 240 hour(s))  Blood culture (routine x 2)     Status: None (Preliminary result)   Collection Time: 02/09/16 11:04 PM  Result Value Ref Range Status   Specimen Description BLOOD LEFT FATTY CASTS  Final   Special Requests BOTTLES DRAWN AEROBIC AND ANAEROBIC  10AERO,10ANA  Final   Culture NO GROWTH 2 DAYS  Final   Report Status PENDING  Incomplete  Urine culture     Status: None   Collection Time: 02/09/16 11:04 PM  Result Value Ref Range Status   Specimen Description  URINE, RANDOM  Final   Special Requests NONE  Final   Culture NO GROWTH Performed at [facility]   Final   Report Status 02/11/2016 FINAL  Final  Blood culture (routine x 2)     Status: None (Preliminary result)   Collection Time: 02/09/16 11:05 PM  Result Value Ref Range Status   Specimen Description BLOOD RIGHT ASSIST CONTROL  Final   Special Requests BOTTLES DRAWN AEROBIC AND ANAEROBIC  10AERO, 10ANA  Final   Culture NO GROWTH 2 DAYS  Final   Report Status PENDING  Incomplete     Studies: Ct Head Wo Contrast  Result Date: 02/11/2016 CLINICAL DATA:  Left leg weakness beginning this morning with right toe pain. Dizziness. EXAM: CT HEAD WITHOUT CONTRAST TECHNIQUE: Contiguous axial images were obtained from the base of the skull through the vertex without intravenous contrast. COMPARISON:  02/10/2016 and 11/16/2015 FINDINGS: Ventricles, cisterns and other CSF spaces are within normal. There is no mass, mass effect, shift of midline structures or acute hemorrhage. There is evidence of chronic ischemic microvascular disease. No evidence of acute infarction. Remaining bones and soft tissues are within normal. IMPRESSION: No acute intracranial findings. Chronic ischemic microvascular disease. Electronically Signed   By: Elberta Fortis M.D.   On: 02/11/2016 15:10   Scheduled Meds: . apixaban  5 mg Oral BID  . brimonidine  1 drop Both Eyes Daily  . insulin aspart  0-5 Units Subcutaneous QHS  . insulin aspart  0-9 Units Subcutaneous TID WC  . ketorolac  1 drop Right Eye QID  . lactulose  30 g Oral Once  . latanoprost  1 drop Both Eyes QHS  . metoCLOPramide  5 mg Oral TID AC & HS  . mometasone-formoterol  2 puff Inhalation BID  . omega-3 acid ethyl esters  1 g Oral Daily  . pantoprazole  40 mg Oral Daily  . polyethylene glycol  17 g Oral Daily  . predniSONE  20 mg Oral Q breakfast  . simvastatin  40 mg Oral QPM  . sodium chloride flush  3 mL Intravenous Q12H  . terazosin  5 mg Oral  QPM  . tiotropium  18 mcg Inhalation Daily  . trimethoprim-polymyxin b  2 drop Right Eye Q6H    Assessment/Plan:  1. Hypoglycemia with history of diabetes.  Hemoglobin A1c 7.4. Patient off D5 drip since yesterday. I will put on sliding scale today. Hold off on Lantus insulin at this point. 2. Altered mental status likely secondary to hypoglycemia. 3. Subjective right leg weakness. Patient refused MRI of the brain. Repeat CAT scan negative for acute events. 4. Bradycardia asymptomatic. Off beta blocker. 5.  Hypotension on presentation. Now hypertensive. 6. Patient on numerous eyedrops. 7. Chronic atrial fibrillation on eliquis 8. Weakness. Physical therapy recommended rehabilitation 9. Nausea and poor appetite. Start Reglan. 10. Constipation we'll give a dose of lactulose. Patient already on MiraLAX.  Code Status:     Code Status Orders        Start     Ordered   02/10/16 0604  Full code  Continuous     02/10/16 0603    Code Status History    Date Active Date Inactive Code Status Order ID Comments User Context   11/16/2015  5:20 AM 11/21/2015  7:37 PM Full Code [id number]  Clydie Braun, MD ED     Family Communication: Spoke with son on the phone yesterday Disposition Plan: Out to rehabilitation potentially tomorrow  Consultants:  Cardiology  Time spent: 24 minutes  Alford Highland  [employer]

## 2016-02-12 NOTE — Progress Notes (Signed)
CSW presented bed offers to patient. Patient accepted bed offer at [facility]. CSW informed Shanda Bumps- Admissions at [facility]. Per MD [place] patient will be ready for discharge tomorrow February 13, 2016. CSW will continue to follow and assist.  Woodroe Mode, MSW, LCSW, LCAS-A Clinical Social Worker [phone]

## 2016-02-12 NOTE — Progress Notes (Signed)
Physical Therapy Treatment Patient Details Name: Bobby Krueger MRN: [id number] DOB: [birth year] Today's Date: 02/12/2016    History of Present Illness presented to ER after episode of decreased responsiveness, dizziness and confusion; admitted for acute hospitalization secondary to acute hypoglycemia ( /dL in field, requiring IV D50).    PT Comments    Pt able to progress ambulation distance to 87ft with RW and CGA this date, but remains limited by dizziness, reporting that he feels "swimmy headed" and as if the room is spinning. BP screened to be 147/62 mmHg. R great toe pain is present only with active dorsiflexion, I suspect this may be related to injury of the flexor hallicus longus tendon. Encouraged pt to rest and ice while injury is acute. Maintain recommendation for SNF as pt remains unsafe to d/c home alone.   Follow Up Recommendations  SNF     Equipment Recommendations  To be determined by post acute care   Recommendations for Other Services       Precautions / Restrictions Precautions Precautions: Fall Restrictions Weight Bearing Restrictions: No    Mobility  Bed Mobility Overal bed mobility: Needs Assistance Bed Mobility: Supine to Sit Supine to sit: Supervision General bed mobility comments: Pt able to assume supine > sit with HOB elevated and heavy use of bedrails. Supervision for safety.  Transfers Overall transfer level: Needs assistance Equipment used: Rolling walker (2 wheeled) Transfers: Sit to/from Stand (x 2 trials) Sit to Stand: Min assist General transfer comment: Pt able to complete 2 sit <> stand transfers with vc's for hand/foot placement and sequencing. Min A for boost up, but maintains standing with just CGA.  Ambulation/Gait Ambulation/Gait assistance: Min assist Ambulation Distance (Feet): 15 Feet Assistive device: Rolling walker (2 wheeled) Gait Pattern/deviations: Step-through pattern;Decreased step length - right;Decreased step  length - left;Narrow base of support Gait velocity: Decreased General Gait Details: Pt requiring min A for sequencing and DME use. No LOB noted. Distance limited by dizziness and feeling "swimmy headed". Vitals screened and noted to be as follows: BP 147/62 mmHg, HR 82 bpm, SaO2 94%   Stairs    Wheelchair Mobility    Modified Rankin (Stroke Patients Only)       Balance Overall balance assessment: Needs assistance Sitting-balance support: Feet unsupported;No upper extremity supported Sitting balance-Leahy Scale: Good     Standing balance support: Bilateral upper extremity supported (on RW) Standing balance-Leahy Scale: Fair    Cognition Arousal/Alertness: Awake/alert Behavior During Therapy: WFL for tasks assessed/performed Overall Cognitive Status: Within Functional Limits for tasks assessed    Exercises General Exercises - Lower Extremity Ankle Circles/Pumps: AROM;Both Quad Sets: AROM;Both Short Arc Quad: AROM;Both Heel Slides: AROM;Both Hip ABduction/ADduction: AROM;Both Straight Leg Raises: AROM;Both All exercises performed bilaterally x 10 reps in supine.  Other Exercises: Pt noted to have 1 beat nystagmus in R eye with horizontal gaze testing, but did not occur again with repeated testing.    General Comments General comments (skin integrity, edema, etc.): Pt with c/o R great toe pain, especially with dorsiflexion.       Pertinent Vitals/Pain Pain Assessment: No/denies pain at rest; pain in R great toe with active dorsiflexion Pt not wearing O2 upon room entry, SaO2 94% Immediately following supine therex, pt noted to be SOB, SaO2 92% Immediately following ambulation, SaO2 93%      PT Goals (current goals can now be found in the care plan section) Acute Rehab PT Goals Patient Stated Goal: to do whatever you think I need to do  PT Goal Formulation: With patient Time For Goal Achievement: 02/24/16 Potential to Achieve Goals: Good Progress towards PT goals:  Progressing toward goals    Frequency  Min 2X/week    PT Plan Current plan remains appropriate       End of Session Equipment Utilized During Treatment: Gait belt Activity Tolerance:  (Limited by dizziness) Patient left: in chair;with call bell/phone within reach;with chair alarm set;with nursing/sitter in room     Time: 3474-2595 PT Time Calculation (min) (ACUTE ONLY): 27 min  Charges:                       G Codes:      Bobby Krueger, SPT 02/12/2016, 2:29 PM

## 2016-02-13 LAB — GLUCOSE, CAPILLARY
GLUCOSE-CAPILLARY: 211 mg/dL — AB (ref 65–99)
GLUCOSE-CAPILLARY: 350 mg/dL — AB (ref 65–99)
Glucose-Capillary: 132 mg/dL — ABNORMAL HIGH (ref 65–99)
Glucose-Capillary: 282 mg/dL — ABNORMAL HIGH (ref 65–99)

## 2016-02-13 LAB — CBC
HCT: 35.2 % — ABNORMAL LOW (ref 40.0–52.0)
Hemoglobin: 12 g/dL — ABNORMAL LOW (ref 13.0–18.0)
MCH: 30.8 pg (ref 26.0–34.0)
MCHC: 34.1 g/dL (ref 32.0–36.0)
MCV: 90.5 fL (ref 80.0–100.0)
Platelets: 141 10*3/uL — ABNORMAL LOW (ref 150–440)
RBC: 3.89 MIL/uL — ABNORMAL LOW (ref 4.40–5.90)
RDW: 13.1 % (ref 11.5–14.5)
WBC: 6.2 10*3/uL (ref 3.8–10.6)

## 2016-02-13 LAB — CREATININE, SERUM
CREATININE: 0.97 mg/dL (ref 0.61–1.24)
GFR calc Af Amer: >60 mL/min (ref >60)

## 2016-02-13 MED ORDER — IPRATROPIUM-ALBUTEROL 0.5-2.5 (3) MG/3ML IN SOLN
3.0000 mL | Freq: Four times a day (QID) | RESPIRATORY_TRACT | Status: DC
  Administered 2016-02-13 (×3): 3 mL via RESPIRATORY_TRACT
  Filled 2016-02-13 (×3): qty 3

## 2016-02-13 MED ORDER — PREDNISONE 20 MG PO TABS
40.0000 mg | ORAL_TABLET | Freq: Every day | ORAL | Status: DC
  Administered 2016-02-13 – 2016-02-14 (×3): 40 mg via ORAL
  Filled 2016-02-13 (×3): qty 2

## 2016-02-13 MED ORDER — PREDNISONE 10 MG PO TABS: 10.0000 mg | ORAL_TABLET | Freq: Every day | ORAL | Status: DC

## 2016-02-13 MED ORDER — INSULIN GLARGINE 100 UNIT/ML ~~LOC~~ SOLN
8.0000 [IU] | Freq: Every day | SUBCUTANEOUS | Status: DC
  Administered 2016-02-13 – 2016-02-14 (×2): 8 [IU] via SUBCUTANEOUS
  Filled 2016-02-13 (×2): qty 0.08

## 2016-02-13 MED ORDER — BUDESONIDE 0.25 MG/2ML IN SUSP
0.2500 mg | Freq: Two times a day (BID) | RESPIRATORY_TRACT | Status: DC
  Administered 2016-02-13 – 2016-02-14 (×3): 0.25 mg via RESPIRATORY_TRACT
  Filled 2016-02-13 (×3): qty 2

## 2016-02-13 MED ORDER — IPRATROPIUM-ALBUTEROL 0.5-2.5 (3) MG/3ML IN SOLN
3.0000 mL | Freq: Three times a day (TID) | RESPIRATORY_TRACT | Status: DC
  Administered 2016-02-14 (×2): 3 mL via RESPIRATORY_TRACT
  Filled 2016-02-13 (×2): qty 3

## 2016-02-13 NOTE — Progress Notes (Signed)
States he is lightheaded.  Lying in bed at approx 30 degree elevation of head. BP 153/66. Pulse 87.

## 2016-02-13 NOTE — Progress Notes (Signed)
Patient ID: Bobby Krueger, male   DOB: July 18, 1927, 82 y.o.   MRN: [id number]  [facility] PROGRESS NOTE  Bobby Krueger VHQ:[id number] DOB: 11-06-1926 DOA: 02/09/2016 PCP: [name]., MD  HPI/Subjective: Patient seen this morning and then reevaluated again. He states he wasn't feeling well he was audible wheezing. A little short of breath. He did have a bowel movement yesterday. Appetite a little bit better.  Objective: Vitals:   02/13/16 1111 02/13/16 1301  BP: (!) 141/63 (!) 153/66  Pulse: 63 84  Resp: (!) 22   Temp: 97.7 F (36.5 C)     Filed Weights   02/09/16 2230 02/10/16 0621  Weight: 91.8 kg (202 lb 4.8 oz) 95.5 kg (210 lb 8 oz)    ROS: Review of Systems  Constitutional: Negative for chills and fever.  Eyes: Negative for blurred vision.  Respiratory: Positive for shortness of breath and wheezing. Negative for cough.   Cardiovascular: Negative for chest pain.  Gastrointestinal: Negative for abdominal pain, constipation, diarrhea, nausea and vomiting.  Genitourinary: Negative for dysuria.  Musculoskeletal: Positive for joint pain.  Neurological: Positive for weakness. Negative for dizziness and headaches.   Exam: Physical Exam  HENT:  Nose: No mucosal edema.  Mouth/Throat: No oropharyngeal exudate or posterior oropharyngeal edema.  Eyes: Conjunctivae, EOM and lids are normal. Pupils are equal, round, and reactive to light.  Neck: No JVD present. Carotid bruit is not present. No edema present. No thyroid mass and no thyromegaly present.  Cardiovascular: S1 normal and S2 normal.  Exam reveals no gallop.   No murmur heard. Pulses:      Dorsalis pedis pulses are 2+ on the right side, and 2+ on the left side.  Respiratory: No respiratory distress. He has decreased breath sounds in the right lower field and the left lower field. He has wheezes in the right upper field, the right middle field, the right lower field, the left upper field, the left middle field  and the left lower field. He has no rhonchi. He has no rales.  GI: Soft. Bowel sounds are normal. There is no tenderness.  Musculoskeletal:       Right ankle: He exhibits no swelling.       Left ankle: He exhibits no swelling.  Right first toe pain to movement and palpation  Lymphadenopathy:    He has no cervical adenopathy.  Neurological: He is alert. No cranial nerve deficit.  Skin: Skin is warm. No rash noted. Nails show no clubbing.  Psychiatric: He has a normal mood and affect.      Data Reviewed: Basic Metabolic Panel:  Recent Labs Lab 02/07/16 0403 02/09/16 2244 02/10/16 0619 02/13/16 0521  NA 140 139 139  --   K 3.8 3.6 3.6  --   CL 105 107 108  --   CO2 --   GLUCOSE 128* 95 79  --   BUN --   CREATININE 1.08 0.99 0.87 0.97  CALCIUM 9.4 8.6* 8.5*  --    Liver Function Tests:  Recent Labs Lab 02/07/16 0403 02/09/16 2244  AST 19 21  ALT 12* 13*  ALKPHOS 90 67  BILITOT 0.8 0.4  PROT 7.3 5.9*  ALBUMIN 3.9 3.3*    Recent Labs Lab 02/07/16 0403  LIPASE 14   CBC:  Recent Labs Lab 02/07/16 0403 02/09/16 2244 02/10/16 0619 02/13/16 0521  WBC 7.4 5.8 4.9 6.2  NEUTROABS 4.1  --   --   --  HGB 14.5 12.1* 12.4* 12.0*  HCT 41.8 34.5* 35.9* 35.2*  MCV 89.7 89.4 90.8 90.5  PLT 166 173 140* 141*   Cardiac Enzymes:  Recent Labs Lab 02/07/16 0744 02/09/16 2244 02/10/16 0619 02/10/16 1237 02/10/16 1759  TROPONINI 0.04* 0.04* 0.03* 0.03* <0.03   BNP (last 3 results)  Recent Labs  11/15/15 2107  BNP 42.7    CBG:  Recent Labs Lab 02/12/16 1201 02/12/16 1627 02/12/16 2040 02/13/16 0716 02/13/16 1111  GLUCAP 165* 230* 267* 132* 211*    Recent Results (from the past 240 hour(s))  Blood culture (routine x 2)     Status: None (Preliminary result)   Collection Time: 02/09/16 11:04 PM  Result Value Ref Range Status   Specimen Description BLOOD LEFT FATTY CASTS  Final   Special Requests BOTTLES DRAWN AEROBIC AND  ANAEROBIC  10AERO,10ANA  Final   Culture NO GROWTH 2 DAYS  Final   Report Status PENDING  Incomplete  Urine culture     Status: None   Collection Time: 02/09/16 11:04 PM  Result Value Ref Range Status   Specimen Description URINE, RANDOM  Final   Special Requests NONE  Final   Culture NO GROWTH Performed at [facility]   Final   Report Status 02/11/2016 FINAL  Final  Blood culture (routine x 2)     Status: None (Preliminary result)   Collection Time: 02/09/16 11:05 PM  Result Value Ref Range Status   Specimen Description BLOOD RIGHT ASSIST CONTROL  Final   Special Requests BOTTLES DRAWN AEROBIC AND ANAEROBIC  10AERO, 10ANA  Final   Culture NO GROWTH 2 DAYS  Final   Report Status PENDING  Incomplete     Studies: Ct Head Wo Contrast  Result Date: 02/11/2016 CLINICAL DATA:  Left leg weakness beginning this morning with right toe pain. Dizziness. EXAM: CT HEAD WITHOUT CONTRAST TECHNIQUE: Contiguous axial images were obtained from the base of the skull through the vertex without intravenous contrast. COMPARISON:  02/10/2016 and 11/16/2015 FINDINGS: Ventricles, cisterns and other CSF spaces are within normal. There is no mass, mass effect, shift of midline structures or acute hemorrhage. There is evidence of chronic ischemic microvascular disease. No evidence of acute infarction. Remaining bones and soft tissues are within normal. IMPRESSION: No acute intracranial findings. Chronic ischemic microvascular disease. Electronically Signed   By: Elberta Fortis M.D.   On: 02/11/2016 15:10   Scheduled Meds: . apixaban  5 mg Oral BID  . brimonidine  1 drop Both Eyes Daily  . budesonide (PULMICORT) nebulizer solution  0.25 mg Nebulization BID  . insulin aspart  0-5 Units Subcutaneous QHS  . insulin aspart  0-9 Units Subcutaneous TID WC  . insulin glargine  8 Units Subcutaneous Daily  . ipratropium-albuterol  3 mL Nebulization Q6H  . ketorolac  1 drop Right Eye QID  . latanoprost  1 drop  Both Eyes QHS  . metoCLOPramide  5 mg Oral TID AC & HS  . omega-3 acid ethyl esters  1 g Oral Daily  . pantoprazole  40 mg Oral Daily  . polyethylene glycol  17 g Oral Daily  . predniSONE  40 mg Oral Q breakfast  . simvastatin  40 mg Oral QPM  . sodium chloride flush  3 mL Intravenous Q12H  . terazosin  5 mg Oral QPM  . tiotropium  18 mcg Inhalation Daily  . trimethoprim-polymyxin b  2 drop Right Eye Q6H    Assessment/Plan:  1. COPD exacerbation with immobility  and being in the bed. Start prednisone 40 mg daily, DuoNeb and budesonide nebulizers  2. Hypoglycemia with history of diabetes.  Hemoglobin A1c 7.4.  With me adding steroids today I did put him on Lantus low-dose. 3. Altered mental status likely secondary to hypoglycemia. 4. Subjective right leg weakness. Patient refused MRI of the brain. Repeat CAT scan negative for acute events. 5. Bradycardia asymptomatic. Off beta blocker. 6. Hypotension on presentation. Now hypertensive. Add low dose ace 7. Patient on numerous eyedrops. 8. Chronic atrial fibrillation on eliquis 9. Weakness. Physical therapy recommended rehabilitation 10. Nausea and poor appetite. Started Reglan. 11. Constipation. Finally had bm yesterday.  Code Status:     Code Status Orders        Start     Ordered   02/10/16 0604  Full code  Continuous     02/10/16 0603    Code Status History    Date Active Date Inactive Code Status Order ID Comments User Context   11/16/2015  5:20 AM 11/21/2015  7:37 PM Full Code [id number]  Clydie Braun, MD ED     Family Communication: Spoke with son on the phone today Disposition Plan: Out to rehabilitation when lungs are clear  Consultants:  Cardiology  Time spent: 24 minutes  Alford Highland  [employer]

## 2016-02-13 NOTE — Progress Notes (Signed)
Patient was unable to walk any distance because he gets dizzy and feels lightheaded.  Sau up in the chair for about 2 hours, then requested to return to bed.

## 2016-02-13 NOTE — Care Management Important Message (Signed)
Important Message  Patient Details  Name: Bobby Krueger MRN: [id number] Date of Birth: 1926/08/01   Medicare Important Message Given:  Yes    Marily Memos, RN 02/13/2016, 8:56 AM

## 2016-02-14 LAB — GLUCOSE, CAPILLARY
GLUCOSE-CAPILLARY: 174 mg/dL — AB (ref 65–99)
GLUCOSE-CAPILLARY: 182 mg/dL — AB (ref 65–99)

## 2016-02-14 MED ORDER — INSULIN ASPART 100 UNIT/ML ~~LOC~~ SOLN: SUBCUTANEOUS | 11 refills | Status: DC

## 2016-02-14 MED ORDER — PREDNISONE 10 MG (21) PO TBPK: 10.0000 mg | ORAL_TABLET | Freq: Every day | ORAL | 0 refills | Status: DC

## 2016-02-14 MED ORDER — INSULIN GLARGINE 100 UNIT/ML ~~LOC~~ SOLN: 8.0000 [IU] | Freq: Every day | SUBCUTANEOUS | 0 refills | Status: DC

## 2016-02-14 NOTE — Discharge Summary (Signed)
[facility] -  at [facility]   PATIENT NAME: Bobby Krueger    MR#:  [id number]  DATE OF BIRTH:  09/02/1926  DATE OF ADMISSION:  02/09/2016 ADMITTING PHYSICIAN: Ihor Austin, MD  DATE OF DISCHARGE: 02/14/16  PRIMARY CARE PHYSICIAN: [name]., MD    ADMISSION DIAGNOSIS:  Sinus bradycardia [R00.1] Hypoglycemia [E16.2] Hypotension, unspecified hypotension type [I95.9] Altered mental status, unspecified altered mental status type [R41.82]  DISCHARGE DIAGNOSIS:  Principal Problem:   Hypoglycemia Active Problems:   Bradycardia copd exacerbation  SECONDARY DIAGNOSIS:   Past Medical History:  Diagnosis Date  . A-fib (HCC)   . CHF (congestive heart failure) (HCC)   . COPD (chronic obstructive pulmonary disease) (HCC)   . Diabetes mellitus without complication (HCC)   . High cholesterol   . Hypertension     HOSPITAL COURSE:  Bobby Krueger  is a 82 y.o. male admitted 02/09/2016 with chief complaint Hypoglycemia . Please see H&P performed by Ihor Austin, MD for further information. Patient presented with the above findings/symptoms. Found to have issues with hypoglycemia. His insulin had been drastically decreased with improvement. He was also noted to have asymptomatic bradycardia and taken off beta blockers. He has been on steroids and breathing treatments for COPD exacerbation with improvement. He does have oxygen at home - usually 2L at night.   DISCHARGE CONDITIONS:   stable  CONSULTS OBTAINED:    DRUG ALLERGIES:   Allergies  Allergen Reactions  . Aldactone [Spironolactone] Hives  . Aspirin Hives  . Ciprofloxacin Hives  . Cyclobenzaprine Hives  . Lexapro [Escitalopram] Hives    DISCHARGE MEDICATIONS:   Current Discharge Medication List    START taking these medications   Details  !! insulin aspart (NOVOLOG) 100 UNIT/ML injection 0-5 Units, Subcutaneous, Daily at bedtime, First dose on Thu 02/12/16 at 2200 Correction  coverage: HS scale CBG < 70: implement hypoglycemia protocol CBG 70 - 120: 0 units CBG 121 - 150: 0 units CBG 151 - 200: 0 units CBG 201 - 250: 2 units CBG 251 - 300: 3 units CBG 301 - 350: 4 units CBG 351 - 400: 5 units CBG > 400: Qty: 10 mL, Refills: 11    !! insulin aspart (NOVOLOG) 100 UNIT/ML injection 0-9 Units, Subcutaneous, 3 times daily with meals, First dose on Thu 02/12/16 at 1200 Correction coverage: Sensitive (thin, NPO, renal) CBG < 70: implement hypoglycemia protocol CBG 70 - 120: 0 units CBG 121 - 150: 1 unit CBG 151 - 200: 2 units CBG 201 - 250: 3 units CBG 251 - 300: 5 units CBG 301 - 350: 7 units CBG 351 - 400: 9 units CBG > 400: call MD Qty: 10 mL, Refills: 11    predniSONE (STERAPRED UNI-PAK 21 TAB) 10 MG (21) TBPK tablet Take 1 tablet (10 mg total) by mouth daily. 40mg  x1 day, 20mg  x2 day, 10mg  x2 day, then stop Qty: 10 tablet, Refills: 0     !! - Potential duplicate medications found. Please discuss with provider.    CONTINUE these medications which have CHANGED   Details  insulin glargine (LANTUS) 100 UNIT/ML injection Inject 0.08 mLs (8 Units total) into the skin at bedtime. Qty: 10 mL, Refills: 0      CONTINUE these medications which have NOT CHANGED   Details  albuterol (PROAIR HFA) 108 (90 Base) MCG/ACT inhaler Inhale 2 puffs into the lungs every 4 (four) hours as needed for wheezing or shortness of breath.     apixaban (ELIQUIS)  5 MG TABS tablet Take 1 tablet (5 mg total) by mouth 2 (two) times daily. Qty: 60 tablet, Refills: 0    brimonidine (ALPHAGAN) 0.2 % ophthalmic solution Place 1 drop into both eyes daily.     carvedilol (COREG) 12.5 MG tablet Take 12.5 mg by mouth 2 (two) times daily.    Fluticasone-Salmeterol (ADVAIR DISKUS) 250-50 MCG/DOSE AEPB Inhale 1 puff into the lungs daily as needed (for wheezing or shortness).     furosemide (LASIX) 40 MG tablet Take 40 mg by mouth daily as needed for fluid or edema.     gabapentin  (NEURONTIN) 300 MG capsule Take 300 mg by mouth at bedtime.    ipratropium-albuterol (DUONEB) 0.5-2.5 (3) MG/3ML SOLN Take 3 mLs by nebulization every 6 (six) hours as needed (for SOB).    ketorolac (ACULAR) 0.5 % ophthalmic solution Place 1 drop into the right eye 4 (four) times daily. Qty: 5 mL, Refills: 0    latanoprost (XALATAN) 0.005 % ophthalmic solution Place 1 drop into both eyes at bedtime. Reported on 12/21/2015    losartan (COZAAR) 100 MG tablet Take 100 mg by mouth daily.    nateglinide (STARLIX) 120 MG tablet Take 120 mg by mouth daily.    Omega-3 1000 MG CAPS Take 1 g by mouth daily.    omeprazole (PRILOSEC) 20 MG capsule Take 20 mg by mouth 2 (two) times daily.    polyvinyl alcohol (LIQUIFILM TEARS) 1.4 % ophthalmic solution Place 1 drop into both eyes as needed for dry eyes. Reported on 12/21/2015    potassium chloride SA (K-DUR,KLOR-CON) 20 MEQ tablet Take 20 mEq by mouth daily.    promethazine (PHENERGAN) 25 MG tablet Take 12.5-25 mg by mouth every 4 (four) hours as needed.    simvastatin (ZOCOR) 40 MG tablet Take 40 mg by mouth every evening.    SPIRIVA HANDIHALER 18 MCG inhalation capsule Place 1 application into inhaler and inhale daily.    terazosin (HYTRIN) 5 MG capsule Take 5 mg by mouth every evening.    triamcinolone cream (KENALOG) 0.1 % Apply 1 application topically 2 (two) times daily as needed (for irritation).    trimethoprim-polymyxin b (POLYTRIM) ophthalmic solution Place 2 drops into the right eye every 6 (six) hours. Qty: 10 mL, Refills: 0    zolpidem (AMBIEN) 10 MG tablet Take 10 mg by mouth at bedtime as needed for sleep.     clopidogrel (PLAVIX) 75 MG tablet Take 75 mg by mouth daily.         DISCHARGE INSTRUCTIONS:    DIET:  Diabetic diet  DISCHARGE CONDITION:  Good  ACTIVITY:  Activity as tolerated  OXYGEN:  Home Oxygen: Yes.     Oxygen Delivery: 2 liters/min via Patient connected to nasal cannula oxygen  DISCHARGE  LOCATION:  nursing home   If you experience worsening of your admission symptoms, develop shortness of breath, life threatening emergency, suicidal or homicidal thoughts you must seek medical attention immediately by calling 911 or calling your MD immediately  if symptoms less severe.  You Must read complete instructions/literature along with all the possible adverse reactions/side effects for all the Medicines you take and that have been prescribed to you. Take any new Medicines after you have completely understood and accpet all the possible adverse reactions/side effects.   Please note  You were cared for by a hospitalist during your hospital stay. If you have any questions about your discharge medications or the care you received while you were in the  hospital after you are discharged, you can call the unit and asked to speak with the hospitalist on call if the hospitalist that took care of you is not available. Once you are discharged, your primary care physician will handle any further medical issues. Please note that NO REFILLS for any discharge medications will be authorized once you are discharged, as it is imperative that you return to your primary care physician (or establish a relationship with a primary care physician if you do not have one) for your aftercare needs so that they can reassess your need for medications and monitor your lab values.    On the day of Discharge:   VITAL SIGNS:  Blood pressure (!) 155/72, pulse (!) 102, temperature 97.8 F (36.6 C), resp. rate 18, height 5\' 7"  (1.702 m), weight 210 lb 8 oz (95.5 kg), SpO2 92 %.  I/O:   Intake/Output Summary (Last 24 hours) at 02/14/16 1037 Last data filed at 02/14/16 0433  Gross per 24 hour  Intake              480 ml  Output             1850 ml  Net            -1370 ml    PHYSICAL EXAMINATION:  GENERAL:  82 y.o.-year-old patient lying in the bed with no acute distress.  EYES: Pupils equal, round, reactive to  light and accommodation. No scleral icterus. Extraocular muscles intact.  HEENT: Head atraumatic, normocephalic. Oropharynx and nasopharynx clear.  NECK:  Supple, no jugular venous distention. No thyroid enlargement, no tenderness.  LUNGS: Normal breath sounds bilaterally, no wheezing, rales,rhonchi or crepitation. No use of accessory muscles of respiration.  CARDIOVASCULAR: S1, S2 normal. No murmurs, rubs, or gallops.  ABDOMEN: Soft, non-tender, non-distended. Bowel sounds present. No organomegaly or mass.  EXTREMITIES: No pedal edema, cyanosis, or clubbing.  NEUROLOGIC: Cranial nerves II through XII are intact. Muscle strength 5/5 in all extremities. Sensation intact. Gait not checked.  PSYCHIATRIC: The patient is alert and oriented x 3.  SKIN: No obvious rash, lesion, or ulcer.   DATA REVIEW:   CBC  Recent Labs Lab 02/13/16 0521  WBC 6.2  HGB 12.0*  HCT 35.2*  PLT 141*    Chemistries   Recent Labs Lab 02/09/16 2244 02/10/16 0619 02/13/16 0521  NA 139 139  --   K 3.6 3.6  --   CL 107 108  --   CO2 29 29  --   GLUCOSE 95 79  --   BUN 10 8  --   CREATININE 0.99 0.87 0.97  CALCIUM 8.6* 8.5*  --   AST 21  --   --   ALT 13*  --   --   ALKPHOS 67  --   --   BILITOT 0.4  --   --     Cardiac Enzymes  Recent Labs Lab 02/10/16 1759  TROPONINI <0.03    Microbiology Results  Results for orders placed or performed during the hospital encounter of 02/09/16  Blood culture (routine x 2)     Status: None (Preliminary result)   Collection Time: 02/09/16 11:04 PM  Result Value Ref Range Status   Specimen Description BLOOD LEFT FATTY CASTS  Final   Special Requests BOTTLES DRAWN AEROBIC AND ANAEROBIC  10AERO,10ANA  Final   Culture NO GROWTH 2 DAYS  Final   Report Status PENDING  Incomplete  Urine culture     Status: None  Collection Time: 02/09/16 11:04 PM  Result Value Ref Range Status   Specimen Description URINE, RANDOM  Final   Special Requests NONE  Final    Culture NO GROWTH Performed at [facility]   Final   Report Status 02/11/2016 FINAL  Final  Blood culture (routine x 2)     Status: None (Preliminary result)   Collection Time: 02/09/16 11:05 PM  Result Value Ref Range Status   Specimen Description BLOOD RIGHT ASSIST CONTROL  Final   Special Requests BOTTLES DRAWN AEROBIC AND ANAEROBIC  10AERO, 10ANA  Final   Culture NO GROWTH 2 DAYS  Final   Report Status PENDING  Incomplete    RADIOLOGY:  No results found.   Management plans discussed with the patient, family and they are in agreement.  CODE STATUS:     Code Status Orders        Start     Ordered   02/10/16 0604  Full code  Continuous     02/10/16 0603    Code Status History    Date Active Date Inactive Code Status Order ID Comments User Context   11/16/2015  5:20 AM 11/21/2015  7:37 PM Full Code [id number]  Clydie Braun, MD ED      TOTAL TIME TAKING CARE OF THIS PATIENT: 33 minutes.    Jabril Pursell,  Mardi Mainland.D on 02/14/2016 at 10:37 AM  Between 7am to 6pm - Pager - [phone]  After 6pm go to [email or web address] - Social research officer, government  [facility] [place] [facility]  Office  [phone]  CC: Primary care physician; [name]., MD

## 2016-02-14 NOTE — Progress Notes (Signed)
A&O. Up with assist. On 3L O2. Uneventful night until this morning when patient got up to use urinal and his heart rate went up to 150s and he got short of breath. THe heart rate came down once he settled back in the bed. Breathing treatment given for shortness of breath and wheezing.

## 2016-02-14 NOTE — Clinical Social Work Note (Signed)
Patient to discharge today to go to [facility] CSW contacted Shanda Bumps at [facility] and she stated that she needed to the admission paperwork completed. CSW spoke with patient about the paperwork and the transportation. Patient is alert and oriented X4 and stated that he would not mind his son taking him or completing the paperwork. CSW contacted patient's son: Sohil Elizardo: [phone] and Simonne Come stated that he would be able to transport and complete paperwork. CSW spoke with Shanda Bumps at [facility] and stated Simonne Come could come on over to do the paperwork. CSW then called Simonne Come back and he was going on over to [place] and then would come to the hospital. Discharge information sent to [place]. York Spaniel MSW,LCSW [phone]

## 2016-02-15 LAB — CULTURE, BLOOD (ROUTINE X 2)
CULTURE: NO GROWTH
Culture: NO GROWTH

## 2016-02-16 ENCOUNTER — Encounter: Payer: Self-pay | Admitting: Internal Medicine

## 2016-02-16 ENCOUNTER — Non-Acute Institutional Stay (SKILLED_NURSING_FACILITY): Payer: Medicare Other | Admitting: Internal Medicine

## 2016-02-16 DIAGNOSIS — R5381 Other malaise: Secondary | ICD-10-CM

## 2016-02-16 DIAGNOSIS — I10 Essential (primary) hypertension: Secondary | ICD-10-CM

## 2016-02-16 DIAGNOSIS — E162 Hypoglycemia, unspecified: Secondary | ICD-10-CM

## 2016-02-16 DIAGNOSIS — R001 Bradycardia, unspecified: Secondary | ICD-10-CM | POA: Diagnosis not present

## 2016-02-16 DIAGNOSIS — E1149 Type 2 diabetes mellitus with other diabetic neurological complication: Secondary | ICD-10-CM | POA: Diagnosis not present

## 2016-02-16 DIAGNOSIS — R600 Localized edema: Secondary | ICD-10-CM | POA: Diagnosis not present

## 2016-02-16 DIAGNOSIS — I2510 Atherosclerotic heart disease of native coronary artery without angina pectoris: Secondary | ICD-10-CM | POA: Diagnosis not present

## 2016-02-16 DIAGNOSIS — J441 Chronic obstructive pulmonary disease with (acute) exacerbation: Secondary | ICD-10-CM

## 2016-02-16 DIAGNOSIS — I480 Paroxysmal atrial fibrillation: Secondary | ICD-10-CM

## 2016-02-16 DIAGNOSIS — G6289 Other specified polyneuropathies: Secondary | ICD-10-CM | POA: Diagnosis not present

## 2016-02-16 DIAGNOSIS — K219 Gastro-esophageal reflux disease without esophagitis: Secondary | ICD-10-CM | POA: Diagnosis not present

## 2016-02-16 DIAGNOSIS — E785 Hyperlipidemia, unspecified: Secondary | ICD-10-CM | POA: Diagnosis not present

## 2016-02-16 NOTE — Progress Notes (Signed)
LOCATION: [facility]  PCP: [name]., MD   Code Status: Full Code  Goals of care: Advanced Directive information Advanced Directives 02/10/2016  Does patient have an advance directive? No  Type of Advance Directive -  Does patient want to make changes to advanced directive? -  Copy of advanced directive(s) in chart? -  Would patient like information on creating an advanced directive? -       Extended Emergency Contact Information Primary Emergency Contact: Edwin, Baines Address: [address]          [place], [place] [address] [place] Home Phone: [phone] Relation: Son Secondary Emergency Contact: Charlott [place] Home Phone: [phone] Mobile Phone: [phone] Relation: None   Allergies  Allergen Reactions  . Aldactone [Spironolactone] Hives  . Aspirin Hives  . Ciprofloxacin Hives  . Cyclobenzaprine Hives  . Lexapro [Escitalopram] Hives    Chief Complaint  Patient presents with  . New Admit To SNF    New Admission     HPI:  Patient is a 82 y.o. male seen today for short term rehabilitation post hospital admission from 02/09/16-02/14/16 with hypoglycemia, bradycardia and COPD exacerbation. His insulin dosing was adjusted. He was taken off b blocker and was treated with steroids and breathing treatment. He made clinical improvement. He is seen in his room today.  Review of Systems:  Constitutional: Negative for fever, chills, diaphoresis. Feels weak and tired.  HENT: Negative for headache, congestion, hearing loss, sore throat, difficulty swallowing. Positive for occasional nasal discharge.   Eyes: Negative for blurred vision, double vision and discharge.  Respiratory: Negative for wheezing. Positive for dry cough and shortness of breath with exertion.   Cardiovascular: Negative for chest pain, palpitations, leg swelling.  Gastrointestinal: Negative for heartburn, nausea, vomiting, abdominal pain. Last  bowel movement was yesterday.  Genitourinary: Negative for dysuria and flank pain.  Musculoskeletal: Negative for fall in the facility.  Skin: Negative for itching, rash.  Neurological: Negative for dizziness. Psychiatric/Behavioral: Negative for depression   Past Medical History:  Diagnosis Date  . A-fib (HCC)   . CHF (congestive heart failure) (HCC)   . COPD (chronic obstructive pulmonary disease) (HCC)   . Diabetes mellitus without complication (HCC)   . High cholesterol   . Hypertension    Past Surgical History:  Procedure Laterality Date  . BACK SURGERY    . CARPAL TUNNEL RELEASE    . CHOLECYSTECTOMY     Social History:   reports that he has quit smoking. He has never used smokeless tobacco. He reports that he does not drink alcohol or use drugs.  Family History  Problem Relation Age of Onset  . Prostate cancer      Medications:   Medication List       Accurate as of 02/16/16  1:41 PM. Always use your most recent med list.          ADVAIR DISKUS 250-50 MCG/DOSE Aepb Generic drug:  Fluticasone-Salmeterol Inhale 1 puff into the lungs daily as needed (for wheezing or shortness).   apixaban 5 MG Tabs tablet Commonly known as:  ELIQUIS Take 1 tablet (5 mg total) by mouth 2 (two) times daily.   brimonidine 0.2 % ophthalmic solution Commonly known as:  ALPHAGAN Place 1 drop into both eyes daily.   carvedilol 12.5 MG tablet Commonly known as:  COREG Take 12.5 mg by mouth 2 (two) times daily with a meal.   clopidogrel 75 MG tablet Commonly  known as:  PLAVIX Take 75 mg by mouth daily.   furosemide 40 MG tablet Commonly known as:  LASIX Take 40 mg by mouth daily as needed for fluid or edema.   gabapentin 300 MG capsule Commonly known as:  NEURONTIN Take 300 mg by mouth at bedtime.   insulin aspart 100 UNIT/ML injection Commonly known as:  novoLOG 0-5 Units, Subcutaneous, Daily at bedtime, First dose on Thu 02/12/16 at 2200 Correction coverage: HS scale  CBG < 70: implement hypoglycemia protocol CBG 70 - 120: 0 units CBG 121 - 150: 0 units CBG 151 - 200: 0 units CBG 201 - 250: 2 units CBG 251 - 300: 3 units CBG 301 - 350: 4 units CBG 351 - 400: 5 units CBG > 400:   insulin aspart 100 UNIT/ML injection Commonly known as:  novoLOG 0-9 Units, Subcutaneous, 3 times daily with meals, First dose on Thu 02/12/16 at 1200 Correction coverage: Sensitive (thin, NPO, renal) CBG < 70: implement hypoglycemia protocol CBG 70 - 120: 0 units CBG 121 - 150: 1 unit CBG 151 - 200: 2 units CBG 201 - 250: 3 units CBG 251 - 300: 5 units CBG 301 - 350: 7 units CBG 351 - 400: 9 units CBG > 400: call MD   insulin glargine 100 UNIT/ML injection Commonly known as:  LANTUS Inject 0.08 mLs (8 Units total) into the skin at bedtime.   ipratropium-albuterol 0.5-2.5 (3) MG/3ML Soln Commonly known as:  DUONEB Take 3 mLs by nebulization every 6 (six) hours as needed (for SOB).   ketorolac 0.5 % ophthalmic solution Commonly known as:  ACULAR Place 1 drop into the right eye 4 (four) times daily.   losartan 100 MG tablet Commonly known as:  COZAAR Take 100 mg by mouth daily.   nateglinide 120 MG tablet Commonly known as:  STARLIX Take 120 mg by mouth daily.   Omega-3 1000 MG Caps Take 1 g by mouth daily.   omeprazole 20 MG capsule Commonly known as:  PRILOSEC Take 20 mg by mouth 2 (two) times daily.   polyvinyl alcohol 1.4 % ophthalmic solution Commonly known as:  LIQUIFILM TEARS Place 1 drop into both eyes as needed for dry eyes. Reported on 12/21/2015   potassium chloride SA 20 MEQ tablet Commonly known as:  K-DUR,KLOR-CON Take 20 mEq by mouth daily.   predniSONE 10 MG (21) Tbpk tablet Commonly known as:  STERAPRED UNI-PAK 21 TAB Take 1 tablet (10 mg total) by mouth daily.  x1 day,  x2 day,  x2 day, then stop   PROAIR HFA 108 (90 Base) MCG/ACT inhaler Generic drug:  albuterol Inhale 2 puffs into the lungs every 4 (four) hours as needed for  wheezing or shortness of breath.   promethazine 25 MG tablet Commonly known as:  PHENERGAN Take 12.5-25 mg by mouth every 4 (four) hours as needed.   simvastatin 40 MG tablet Commonly known as:  ZOCOR Take 40 mg by mouth every evening.   SPIRIVA HANDIHALER 18 MCG inhalation capsule Generic drug:  tiotropium Place 1 application into inhaler and inhale daily.   terazosin 5 MG capsule Commonly known as:  HYTRIN Take 5 mg by mouth every evening.   triamcinolone cream 0.1 % Commonly known as:  KENALOG Apply 1 application topically 2 (two) times daily as needed (for irritation).   trimethoprim-polymyxin b ophthalmic solution Commonly known as:  POLYTRIM Place 2 drops into the right eye every 6 (six) hours.   zolpidem 10 MG tablet Commonly  known as:  AMBIEN Take 10 mg by mouth at bedtime as needed for sleep.       Immunizations:  There is no immunization history on file for this patient.   Physical Exam:  Vitals:   02/16/16 1207  BP: (!) 160/78  Pulse: 88  Resp: 20  Temp: 98.2 F (36.8 C)  TempSrc: Oral  SpO2: 95%  Weight: 205 lb (93 kg)  Height: 5\' 6"  (1.676 m)   Body mass index is 33.09 kg/m.  General- elderly male, obese, in no acute distress Head- normocephalic, atraumatic Nose- no maxillary or frontal sinus tenderness, no nasal discharge Throat- moist mucus membrane, has dentures Eyes- PERRLA, EOMI, no pallor, no icterus, no discharge Neck- no cervical lymphadenopathy Cardiovascular- normal s1,s2, no murmur Respiratory- bilateral clear to auscultation, no wheeze, no rhonchi, no crackles, no use of accessory muscles Abdomen- bowel sounds present, soft, non tender, periumbilical hernia present Musculoskeletal- able to move all 4 extremities, generalized weakness, no leg edema Neurological- alert and oriented to person, place and time Skin- warm and dry Psychiatry- normal mood and affect    Labs reviewed: Basic Metabolic Panel:  Recent Labs   11/16/15 0535  02/07/16 0403 02/09/16 2244 02/10/16 0619 02/13/16 0521  NA 137  < > 140 139 139  --   K 3.8  < > 3.8 3.6 3.6  --   CL 100*  < > 105 107 108  --   CO2 28  < > 29 29 29   --   GLUCOSE 241*  < > 128* 95 79  --   BUN 11  < > 13 10 8   --   CREATININE 1.23  < > 1.08 0.99 0.87 0.97  CALCIUM 8.8*  < > 9.4 8.6* 8.5*  --   MG 1.7  --   --   --   --   --   PHOS 4.3  --   --   --   --   --   < > = values in this interval not displayed. Liver Function Tests:  Recent Labs  02/05/16 1519 02/07/16 0403 02/09/16 2244  AST 20 19 21   ALT 12* 12* 13*  ALKPHOS 89 90 67  BILITOT 0.7 0.8 0.4  PROT 7.3 7.3 5.9*  ALBUMIN 3.8 3.9 3.3*    Recent Labs  12/21/15 2030 02/05/16 1519 02/07/16 0403  LIPASE 16 13 14    No results for input(s): AMMONIA in the last 8760 hours. CBC:  Recent Labs  12/21/15 2030 01/01/16 1003  02/07/16 0403 02/09/16 2244 02/10/16 0619 02/13/16 0521  WBC 5.9 5.5  < > 7.4 5.8 4.9 6.2  NEUTROABS 3.7 3.4  --  4.1  --   --   --   HGB 13.2 13.0  < > 14.5 12.1* 12.4* 12.0*  HCT 38.9* 38.5*  < > 41.8 34.5* 35.9* 35.2*  MCV 87.2 88.1  < > 89.7 89.4 90.8 90.5  PLT 175 156  < > 166 173 140* 141*  < > = values in this interval not displayed. Cardiac Enzymes:  Recent Labs  02/10/16 0619 02/10/16 1237 02/10/16 1759  TROPONINI 0.03* 0.03* <0.03   BNP: Invalid input(s): POCBNP CBG:  Recent Labs  02/13/16 2118 02/14/16 0747 02/14/16 1227  GLUCAP 282* 174* 182*    Radiological Exams: Ct Head Wo Contrast  Result Date: 02/11/2016 CLINICAL DATA:  Left leg weakness beginning this morning with right toe pain. Dizziness. EXAM: CT HEAD WITHOUT CONTRAST TECHNIQUE: Contiguous axial images were obtained  from the base of the skull through the vertex without intravenous contrast. COMPARISON:  02/10/2016 and 11/16/2015 FINDINGS: Ventricles, cisterns and other CSF spaces are within normal. There is no mass, mass effect, shift of midline structures or acute  hemorrhage. There is evidence of chronic ischemic microvascular disease. No evidence of acute infarction. Remaining bones and soft tissues are within normal. IMPRESSION: No acute intracranial findings. Chronic ischemic microvascular disease. Electronically Signed   By: Elberta Fortis M.D.   On: 02/11/2016 15:10  Ct Head Wo Contrast  Result Date: 02/10/2016 CLINICAL DATA:  82 year old male with altered mental status EXAM: CT HEAD WITHOUT CONTRAST TECHNIQUE: Contiguous axial images were obtained from the base of the skull through the vertex without intravenous contrast. COMPARISON:  CT dated 11/16/2015 FINDINGS: The ventricles and sulci are appropriate in size for patient's age. Moderate periventricular and deep white matter chronic microvascular ischemic changes noted. There is no acute intracranial hemorrhage. No mass effect or midline shift noted. There is mild mucoperiosteal thickening of paranasal sinuses. No air-fluid levels. The mastoid air cells are clear. The calvarium is intact. IMPRESSION: No acute intracranial hemorrhage. Age-related atrophy and chronic microvascular ischemic disease. If symptoms persist and there are no contraindications, MRI may provide better evaluation if clinically indicated. Electronically Signed   By: Elgie Collard M.D.   On: 02/10/2016 03:17  Dg Chest Portable 1 View  Result Date: 02/09/2016 CLINICAL DATA:  Unresponsive EXAM: PORTABLE CHEST 1 VIEW COMPARISON:  02/07/2016 FINDINGS: Cardiac shadow remains enlarged. The lungs are again well aerated without focal infiltrate or sizable effusion. Minimal left basilar atelectasis is noted. Postsurgical changes in the cervical spine are seen. Aortic calcifications are again noted. IMPRESSION: Minimal left basilar atelectasis. Aortic atherosclerosis Electronically Signed   By: Alcide Clever M.D.   On: 02/09/2016 23:17  Dg Abd Acute W/chest  Result Date: 02/07/2016 CLINICAL DATA:  Pain and nausea EXAM: DG ABDOMEN ACUTE W/ 1V  CHEST COMPARISON:  01/01/2016 chest x-ray.  Abdominal CT 12/21/2015 FINDINGS: Chronic cardiomegaly and aortic tortuosity. There is no edema, consolidation, effusion, or pneumothorax. Calcified granuloma at the left base. Nonobstructive bowel gas pattern. No concern for pneumatosis or pneumoperitoneum. Vascular calcifications. No evidence of urolithiasis. Cholecystectomy changes. IMPRESSION: No acute findings in the chest or abdomen. Electronically Signed   By: Marnee Spring M.D.   On: 02/07/2016 03:46    Assessment/Plan   physical deconditioning Will have her work with physical therapy and occupational therapy team to help with gait training and muscle strengthening exercises.fall precautions. Skin care. Encourage to be out of bed.   COPD exacerbation Improved breathing. Continue and complete tapering course of prednisone on 02/19/16. Continue his bronchodilators and wean off o2 as tolerated to only at bedtime for o2 sat goal of >88%  DM type 2 with neuropathy Lab Results  Component Value Date   HGBA1C 7.4 (H) 02/10/2016   Continue lantus 8 u daily with SSI novolog and nateglinide. Monitor cbg. continue gabapentin  Hypoglycemia Had low cbg causing hospitalization. Monitor cbg  afib Rate controlled. Continue eliquis 5 mg bid and coreg  Bradycardia Per discharge summary, b blocker was discontinued but he has been discharged on low dose b blocker with his afib. Continue this and monitor HR for now  HTN Monitor bp bid, continue coreg 12.5 mg bid, check bmp  gerd Stable, D/C omeprazole with him on plavix and start pantoprazole 40 mg daily and monitor  CAD Continue losartan 100 mg daily, coreg 12.5 mg bid, statin and plavix  Peripheral neuropathy Continue gabapentin 300 mg qhs and monitor  HLD Continue his statin  Leg edema On lasix as needed and kcl supplement, check bmp   Goals of care: short term rehabilitation   Labs/tests ordered: cbc, cmp 02/17/16  Family/ staff  Communication: reviewed care plan with patient and nursing supervisor    Oneal Grout, MD Internal Medicine [facility] [address] [place], [place] [address] Cell Phone (Monday-Friday 8 am - 5 pm): [phone] On Call: [phone] and follow prompts after 5 pm and on weekends Office Phone: [phone] Office Fax: [phone]

## 2016-02-19 ENCOUNTER — Non-Acute Institutional Stay (SKILLED_NURSING_FACILITY): Payer: Medicare Other | Admitting: Family

## 2016-02-19 DIAGNOSIS — E1165 Type 2 diabetes mellitus with hyperglycemia: Secondary | ICD-10-CM

## 2016-02-19 DIAGNOSIS — IMO0001 Reserved for inherently not codable concepts without codable children: Secondary | ICD-10-CM

## 2016-02-19 DIAGNOSIS — Z794 Long term (current) use of insulin: Secondary | ICD-10-CM

## 2016-02-19 DIAGNOSIS — E1149 Type 2 diabetes mellitus with other diabetic neurological complication: Secondary | ICD-10-CM | POA: Insufficient documentation

## 2016-02-19 NOTE — Progress Notes (Signed)
Location:   [facility]    Place of Service:  SNF (31) Provider:  Tavares Levinson FNP-C   [name]., MD  Patient Care Team: [name], MD as PCP - General (Cardiology)  Extended Emergency Contact Information Primary Emergency Contact: Gavin Potters Address: [address]          [place], [place] [address] [place] Home Phone: [phone] Relation: Son Secondary Emergency Contact: Charlott Rakes [place] Home Phone: [phone] Mobile Phone: [phone] Relation: None  Code Status:  Full Code  Goals of care: Advanced Directive information Advanced Directives 02/10/2016  Does patient have an advance directive? No  Type of Advance Directive -  Does patient want to make changes to advanced directive? -  Copy of advanced directive(s) in chart? -  Would patient like information on creating an advanced directive? -     Chief Complaint  Patient presents with  . Acute Visit    HPI:  Pt is a 82 y.o. male seen today at [facility] for an acute visit for evaluation high blood sugars. He is seen in his room today per facility Nurse request who states patient's CBG's ranging in the 400's. On call provider notified overnight CBG 416  a one time dose of Humalog 12 units ordered. He denies any acute issues this visit. He denies eating any high concentrated sweet snacks. No Hyperglycemic symptoms. He continues to exercise with PT/OT. He states his breathing has improved. Afebrile.     Past Medical History:  Diagnosis Date  . A-fib (HCC)   . CHF (congestive heart failure) (HCC)   . COPD (chronic obstructive pulmonary disease) (HCC)   . Diabetes mellitus without complication (HCC)   . High cholesterol   . Hypertension    Past Surgical History:  Procedure Laterality Date  . BACK SURGERY    . CARPAL TUNNEL RELEASE    . CHOLECYSTECTOMY      Allergies  Allergen Reactions  . Aldactone [Spironolactone]  Hives  . Aspirin Hives  . Ciprofloxacin Hives  . Cyclobenzaprine Hives  . Lexapro [Escitalopram] Hives      Medication List       Accurate as of 02/19/16  5:21 PM. Always use your most recent med list.          ADVAIR DISKUS 250-50 MCG/DOSE Aepb Generic drug:  Fluticasone-Salmeterol Inhale 1 puff into the lungs daily as needed (for wheezing or shortness).   apixaban 5 MG Tabs tablet Commonly known as:  ELIQUIS Take 1 tablet (5 mg total) by mouth 2 (two) times daily.   brimonidine 0.2 % ophthalmic solution Commonly known as:  ALPHAGAN Place 1 drop into both eyes daily.   carvedilol 12.5 MG tablet Commonly known as:  COREG Take 12.5 mg by mouth 2 (two) times daily with a meal.   clopidogrel 75 MG tablet Commonly known as:  PLAVIX Take 75 mg by mouth daily.   furosemide 40 MG tablet Commonly known as:  LASIX Take 40 mg by mouth daily as needed for fluid or edema.   gabapentin 300 MG capsule Commonly known as:  NEURONTIN Take 300 mg by mouth at bedtime.   insulin glargine 100 UNIT/ML injection Commonly known as:  LANTUS Inject 0.08 mLs (8 Units total) into the skin at bedtime.   insulin lispro 100 UNIT/ML injection Commonly known as:  HUMALOG Inject 0-9 Units into the skin 3 (three) times daily before meals. CBG's 70-120 =  0 units; 121-150 =1 units; 151-200 = 2 units; 201-250 =3 units; 251-300 =5 units; 301-350 =7 units; 351-400=9 units and > 400 Notify provider.   ipratropium-albuterol 0.5-2.5 (3) MG/3ML Soln Commonly known as:  DUONEB Take 3 mLs by nebulization every 6 (six) hours as needed (for SOB).   ketorolac 0.5 % ophthalmic solution Commonly known as:  ACULAR Place 1 drop into the right eye 4 (four) times daily.   losartan 100 MG tablet Commonly known as:  COZAAR Take 100 mg by mouth daily.   nateglinide 120 MG tablet Commonly known as:  STARLIX Take 120 mg by mouth daily.   Omega-3 1000 MG Caps Take 1 g by mouth daily.   pantoprazole 40 MG  tablet Commonly known as:  PROTONIX Take 40 mg by mouth daily.   polyvinyl alcohol 1.4 % ophthalmic solution Commonly known as:  LIQUIFILM TEARS Place 1 drop into both eyes as needed for dry eyes. Reported on 12/21/2015   potassium chloride SA 20 MEQ tablet Commonly known as:  K-DUR,KLOR-CON Take 20 mEq by mouth daily.   predniSONE 10 MG (21) Tbpk tablet Commonly known as:  STERAPRED UNI-PAK 21 TAB Take 1 tablet (10 mg total) by mouth daily.  x1 day,  x2 day,  x2 day, then stop   PROAIR HFA 108 (90 Base) MCG/ACT inhaler Generic drug:  albuterol Inhale 2 puffs into the lungs every 4 (four) hours as needed for wheezing or shortness of breath.   promethazine 25 MG tablet Commonly known as:  PHENERGAN Take 12.5-25 mg by mouth every 4 (four) hours as needed.   simvastatin 40 MG tablet Commonly known as:  ZOCOR Take 40 mg by mouth every evening.   SPIRIVA HANDIHALER 18 MCG inhalation capsule Generic drug:  tiotropium Place 1 application into inhaler and inhale daily.   terazosin 5 MG capsule Commonly known as:  HYTRIN Take 5 mg by mouth every evening.   triamcinolone cream 0.1 % Commonly known as:  KENALOG Apply 1 application topically 2 (two) times daily as needed (for irritation).   trimethoprim-polymyxin b ophthalmic solution Commonly known as:  POLYTRIM Place 2 drops into the right eye every 6 (six) hours.   zolpidem 10 MG tablet Commonly known as:  AMBIEN Take 10 mg by mouth at bedtime as needed for sleep.       Review of Systems  Constitutional: Negative for activity change, appetite change, chills, fatigue and fever.  HENT: Negative for congestion, rhinorrhea, sinus pressure, sneezing and sore throat.   Respiratory: Negative for cough, chest tightness and wheezing.        Continuous oxygen   Cardiovascular: Negative for chest pain, palpitations and leg swelling.  Gastrointestinal: Negative for abdominal distention, abdominal pain, constipation,  diarrhea, nausea and vomiting.  Endocrine: Negative for cold intolerance, heat intolerance, polydipsia, polyphagia and polyuria.  Genitourinary: Negative for dysuria, flank pain, frequency and urgency.  Musculoskeletal: Positive for gait problem.       Uses walker   Skin: Negative for color change, pallor and rash.  Neurological: Negative for dizziness, seizures, syncope, light-headedness and headaches.  Psychiatric/Behavioral: Negative for agitation, confusion, hallucinations and sleep disturbance. The patient is not nervous/anxious.      There is no immunization history on file for this patient. Pertinent  Health Maintenance Due  Topic Date Due  . FOOT EXAM  10/08/1936  . OPHTHALMOLOGY EXAM  10/08/1936  . PNA vac Low Risk Adult (1 of 2 - PCV13) 10/07/1993  . INFLUENZA VACCINE  02/17/2016  .  HEMOGLOBIN A1C  08/12/2016      Vitals:   02/19/16 1440  BP: 132/79  Pulse: 67  Resp: 20  Temp: 98.2 F (36.8 C)  SpO2: 98%  Weight: 200 lb (90.7 kg)  Height: 5\' 6"  (1.676 m)   Body mass index is 32.28 kg/m. Physical Exam  Constitutional: He is oriented to person, place, and time.  Obese Elderly in no acute distress.   HENT:  Head: Normocephalic.  Mouth/Throat: Oropharynx is clear and moist. No oropharyngeal exudate.  Eyes: Conjunctivae and EOM are normal. Pupils are equal, round, and reactive to light. Right eye exhibits no discharge. Left eye exhibits no discharge. No scleral icterus.  Neck: Normal range of motion. No JVD present. No thyromegaly present.  Cardiovascular: Normal rate, regular rhythm, normal heart sounds and intact distal pulses.  Exam reveals no gallop and no friction rub.   No murmur heard. Pulmonary/Chest: Effort normal and breath sounds normal. No respiratory distress. He has no wheezes.  O2 2 liters via Nasal cannula  Abdominal: Soft. Bowel sounds are normal. He exhibits no distension. There is no tenderness. There is no rebound and no guarding.    Genitourinary:  Genitourinary Comments: Continent   Musculoskeletal: Normal range of motion. He exhibits no edema, tenderness or deformity.  Lymphadenopathy:    He has no cervical adenopathy.  Neurological: He is oriented to person, place, and time.  Skin: Skin is warm and dry. No rash noted. No erythema. No pallor.  Psychiatric: He has a normal mood and affect.    Labs reviewed:  Recent Labs  11/16/15 0535  02/07/16 0403 02/09/16 2244 02/10/16 0619 02/13/16 0521  NA 137  < > 140 139 139  --   K 3.8  < > 3.8 3.6 3.6  --   CL 100*  < > 105 107 108  --   CO2 28  < > 29 29 29   --   GLUCOSE 241*  < > 128* 95 79  --   BUN 11  < > 13 10 8   --   CREATININE 1.23  < > 1.08 0.99 0.87 0.97  CALCIUM 8.8*  < > 9.4 8.6* 8.5*  --   MG 1.7  --   --   --   --   --   PHOS 4.3  --   --   --   --   --   < > = values in this interval not displayed.  Recent Labs  02/05/16 1519 02/07/16 0403 02/09/16 2244  AST 20 19 21   ALT 12* 12* 13*  ALKPHOS 89 90 67  BILITOT 0.7 0.8 0.4  PROT 7.3 7.3 5.9*  ALBUMIN 3.8 3.9 3.3*    Recent Labs  12/21/15 2030 01/01/16 1003  02/07/16 0403 02/09/16 2244 02/10/16 0619 02/13/16 0521  WBC 5.9 5.5  < > 7.4 5.8 4.9 6.2  NEUTROABS 3.7 3.4  --  4.1  --   --   --   HGB 13.2 13.0  < > 14.5 12.1* 12.4* 12.0*  HCT 38.9* 38.5*  < > 41.8 34.5* 35.9* 35.2*  MCV 87.2 88.1  < > 89.7 89.4 90.8 90.5  PLT 175 156  < > 166 173 140* 141*  < > = values in this interval not displayed. Lab Results  Component Value Date   TSH 0.821 02/10/2016   Lab Results  Component Value Date   HGBA1C 7.4 (H) 02/10/2016   Lab Results  Component Value Date   CHOL 89  06/06/2013   HDL 37 (L) 06/06/2013   LDLCALC 33 06/06/2013   TRIG 95 06/06/2013    Significant Diagnostic Results in last 30 days:  Ct Head Wo Contrast  Result Date: 02/11/2016 CLINICAL DATA:  Left leg weakness beginning this morning with right toe pain. Dizziness. EXAM: CT HEAD WITHOUT CONTRAST TECHNIQUE:  Contiguous axial images were obtained from the base of the skull through the vertex without intravenous contrast. COMPARISON:  02/10/2016 and 11/16/2015 FINDINGS: Ventricles, cisterns and other CSF spaces are within normal. There is no mass, mass effect, shift of midline structures or acute hemorrhage. There is evidence of chronic ischemic microvascular disease. No evidence of acute infarction. Remaining bones and soft tissues are within normal. IMPRESSION: No acute intracranial findings. Chronic ischemic microvascular disease. Electronically Signed   By: Elberta Fortis M.D.   On: 02/11/2016 15:10  Ct Head Wo Contrast  Result Date: 02/10/2016 CLINICAL DATA:  82 year old male with altered mental status EXAM: CT HEAD WITHOUT CONTRAST TECHNIQUE: Contiguous axial images were obtained from the base of the skull through the vertex without intravenous contrast. COMPARISON:  CT dated 11/16/2015 FINDINGS: The ventricles and sulci are appropriate in size for patient's age. Moderate periventricular and deep white matter chronic microvascular ischemic changes noted. There is no acute intracranial hemorrhage. No mass effect or midline shift noted. There is mild mucoperiosteal thickening of paranasal sinuses. No air-fluid levels. The mastoid air cells are clear. The calvarium is intact. IMPRESSION: No acute intracranial hemorrhage. Age-related atrophy and chronic microvascular ischemic disease. If symptoms persist and there are no contraindications, MRI may provide better evaluation if clinically indicated. Electronically Signed   By: Elgie Collard M.D.   On: 02/10/2016 03:17  Dg Chest Portable 1 View  Result Date: 02/09/2016 CLINICAL DATA:  Unresponsive EXAM: PORTABLE CHEST 1 VIEW COMPARISON:  02/07/2016 FINDINGS: Cardiac shadow remains enlarged. The lungs are again well aerated without focal infiltrate or sizable effusion. Minimal left basilar atelectasis is noted. Postsurgical changes in the cervical spine are seen.  Aortic calcifications are again noted. IMPRESSION: Minimal left basilar atelectasis. Aortic atherosclerosis Electronically Signed   By: Alcide Clever M.D.   On: 02/09/2016 23:17  Dg Abd Acute W/chest  Result Date: 02/07/2016 CLINICAL DATA:  Pain and nausea EXAM: DG ABDOMEN ACUTE W/ 1V CHEST COMPARISON:  01/01/2016 chest x-ray.  Abdominal CT 12/21/2015 FINDINGS: Chronic cardiomegaly and aortic tortuosity. There is no edema, consolidation, effusion, or pneumothorax. Calcified granuloma at the left base. Nonobstructive bowel gas pattern. No concern for pneumatosis or pneumoperitoneum. Vascular calcifications. No evidence of urolithiasis. Cholecystectomy changes. IMPRESSION: No acute findings in the chest or abdomen. Electronically Signed   By: Marnee Spring M.D.   On: 02/07/2016 03:46    Assessment/Plan 1. Uncontrolled type 2 diabetes mellitus without complication, with long-term current use of insulin (HCC) CBG's ranging 180's -400's . Continue on Humalog per SSI. Increase Lantus to 10 units SQ at bedtime.  Hgb A1C and Lipid panel 02/20/2016    Family/ staff Communication: Reviewed plan of care with patient and facility Nurse supervisor.   Labs/tests ordered:  Hgb A1C and Lipid panel 02/20/2016

## 2016-02-23 ENCOUNTER — Non-Acute Institutional Stay (SKILLED_NURSING_FACILITY): Payer: Medicare Other | Admitting: Family

## 2016-02-23 DIAGNOSIS — I5022 Chronic systolic (congestive) heart failure: Secondary | ICD-10-CM | POA: Diagnosis not present

## 2016-02-23 DIAGNOSIS — R635 Abnormal weight gain: Secondary | ICD-10-CM | POA: Diagnosis not present

## 2016-02-23 DIAGNOSIS — R600 Localized edema: Secondary | ICD-10-CM

## 2016-02-23 MED ORDER — FUROSEMIDE 40 MG PO TABS: 40.0000 mg | ORAL_TABLET | Freq: Every day | ORAL | 0 refills | Status: AC

## 2016-02-23 NOTE — Progress Notes (Signed)
Location:   [facility]    Place of Service:  SNF (31) Provider:  Patton Rabinovich FNP-C   [name]., MD  Patient Care Team: [name], MD as PCP - General (Cardiology)  Extended Emergency Contact Information Primary Emergency Contact: Gavin Potters Address: [address]          [place], [place] [address] [place] Home Phone: [phone] Relation: Son Secondary Emergency Contact: Charlott Rakes [place] Home Phone: [phone] Mobile Phone: [phone] Relation: None  Code Status:  Full Code  Goals of care: Advanced Directive information Advanced Directives 02/10/2016  Does patient have an advance directive? No  Type of Advance Directive -  Does patient want to make changes to advanced directive? -  Copy of advanced directive(s) in chart? -  Would patient like information on creating an advanced directive? -     Chief Complaint  Patient presents with  . Acute Visit    HPI:  Pt is a 82 y.o. male seen today at [facility] for an acute visit for evaluation of weight gain. He is seen in his room today. He denies any acute issues. Facility staff reports patient has had a weight gain of 7 pounds in 6 days. He denies any shortness of breath or wheezing. He has not required any oxygen during the day but states wears oxygen only at bedtime. Facility Nurse also states patient complained on chest pain over the weekend and was given Nitro with symptom relief. He states usually gets chest pain across the chest once in a while at home but does not take any medication. He denies any hurt burn or cough.    Past Medical History:  Diagnosis Date  . A-fib (HCC)   . CHF (congestive heart failure) (HCC)   . COPD (chronic obstructive pulmonary disease) (HCC)   . Diabetes mellitus without complication (HCC)   . High cholesterol   . Hypertension    Past Surgical History:  Procedure Laterality Date  . BACK  SURGERY    . CARPAL TUNNEL RELEASE    . CHOLECYSTECTOMY      Allergies  Allergen Reactions  . Aldactone [Spironolactone] Hives  . Aspirin Hives  . Ciprofloxacin Hives  . Cyclobenzaprine Hives  . Lexapro [Escitalopram] Hives      Medication List       Accurate as of 02/23/16  3:28 PM. Always use your most recent med list.          ADVAIR DISKUS 250-50 MCG/DOSE Aepb Generic drug:  Fluticasone-Salmeterol Inhale 1 puff into the lungs daily as needed (for wheezing or shortness).   apixaban 5 MG Tabs tablet Commonly known as:  ELIQUIS Take 1 tablet (5 mg total) by mouth 2 (two) times daily.   brimonidine 0.2 % ophthalmic solution Commonly known as:  ALPHAGAN Place 1 drop into both eyes daily.   carvedilol 12.5 MG tablet Commonly known as:  COREG Take 12.5 mg by mouth 2 (two) times daily with a meal.   clopidogrel 75 MG tablet Commonly known as:  PLAVIX Take 75 mg by mouth daily.   furosemide 40 MG tablet Commonly known as:  LASIX Take 1 tablet (40 mg total) by mouth daily.   gabapentin 300 MG capsule Commonly known as:  NEURONTIN Take 300 mg by mouth at bedtime.   insulin glargine 100 UNIT/ML injection Commonly known as:  LANTUS Inject 0.08 mLs (8 Units total) into the skin at  bedtime.   insulin lispro 100 UNIT/ML injection Commonly known as:  HUMALOG Inject 0-9 Units into the skin 3 (three) times daily before meals. CBG's 70-120 = 0 units; 121-150 =1 units; 151-200 = 2 units; 201-250 =3 units; 251-300 =5 units; 301-350 =7 units; 351-400=9 units and > 400 Notify provider.   ipratropium-albuterol 0.5-2.5 (3) MG/3ML Soln Commonly known as:  DUONEB Take 3 mLs by nebulization every 6 (six) hours as needed (for SOB).   ketorolac 0.5 % ophthalmic solution Commonly known as:  ACULAR Place 1 drop into the right eye 4 (four) times daily.   losartan 100 MG tablet Commonly known as:  COZAAR Take 100 mg by mouth daily.   nateglinide 120 MG tablet Commonly known as:   STARLIX Take 120 mg by mouth daily.   Omega-3 1000 MG Caps Take 1 g by mouth daily.   pantoprazole 40 MG tablet Commonly known as:  PROTONIX Take 40 mg by mouth daily.   polyvinyl alcohol 1.4 % ophthalmic solution Commonly known as:  LIQUIFILM TEARS Place 1 drop into both eyes as needed for dry eyes. Reported on 12/21/2015   potassium chloride SA 20 MEQ tablet Commonly known as:  K-DUR,KLOR-CON Take 20 mEq by mouth daily.   predniSONE 10 MG (21) Tbpk tablet Commonly known as:  STERAPRED UNI-PAK 21 TAB Take 1 tablet (10 mg total) by mouth daily. 40mg  x1 day, 20mg  x2 day, 10mg  x2 day, then stop   PROAIR HFA 108 (90 Base) MCG/ACT inhaler Generic drug:  albuterol Inhale 2 puffs into the lungs every 4 (four) hours as needed for wheezing or shortness of breath.   promethazine 25 MG tablet Commonly known as:  PHENERGAN Take 12.5-25 mg by mouth every 4 (four) hours as needed.   simvastatin 40 MG tablet Commonly known as:  ZOCOR Take 40 mg by mouth every evening.   SPIRIVA HANDIHALER 18 MCG inhalation capsule Generic drug:  tiotropium Place 1 application into inhaler and inhale daily.   terazosin 5 MG capsule Commonly known as:  HYTRIN Take 5 mg by mouth every evening.   triamcinolone cream 0.1 % Commonly known as:  KENALOG Apply 1 application topically 2 (two) times daily as needed (for irritation).   trimethoprim-polymyxin b ophthalmic solution Commonly known as:  POLYTRIM Place 2 drops into the right eye every 6 (six) hours.   zolpidem 10 MG tablet Commonly known as:  AMBIEN Take 10 mg by mouth at bedtime as needed for sleep.       Review of Systems  Constitutional: Negative for activity change, appetite change, chills, fatigue and fever.  HENT: Negative for congestion, rhinorrhea, sinus pressure, sneezing and sore throat.   Respiratory: Negative for cough, chest tightness and wheezing.         Not wearing oxygen during visit   Cardiovascular: Negative for chest  pain, palpitations and leg swelling.       Had chest pain over the weekend.   Gastrointestinal: Negative for abdominal distention, abdominal pain, constipation, diarrhea, nausea and vomiting.  Endocrine: Negative for cold intolerance, heat intolerance, polydipsia, polyphagia and polyuria.  Genitourinary: Negative for dysuria, flank pain, frequency and urgency.  Musculoskeletal: Positive for gait problem.       Uses walker   Skin: Negative for color change, pallor and rash.  Neurological: Negative for dizziness, seizures, syncope, light-headedness and headaches.  Psychiatric/Behavioral: Negative for agitation, confusion, hallucinations and sleep disturbance. The patient is not nervous/anxious.      There is no immunization history on  file for this patient. Pertinent  Health Maintenance Due  Topic Date Due  . FOOT EXAM  10/08/1936  . OPHTHALMOLOGY EXAM  10/08/1936  . PNA vac Low Risk Adult (1 of 2 - PCV13) 10/07/1993  . INFLUENZA VACCINE  02/17/2016  . HEMOGLOBIN A1C  08/12/2016   No flowsheet data found. Functional Status Survey:    Vitals:   02/23/16 1512  BP: (!) 151/90  Pulse: 80  Resp: 16  Temp: 98.2 F (36.8 C)  SpO2: 96%  Weight: 206 lb 6.4 oz (93.6 kg)  Height: 5\' 6"  (1.676 m)   Body mass index is 33.31 kg/m. Physical Exam  Constitutional: He is oriented to person, place, and time.  Obese Elderly in no acute distress.   HENT:  Head: Normocephalic.  Mouth/Throat: Oropharynx is clear and moist. No oropharyngeal exudate.  Eyes: Conjunctivae and EOM are normal. Pupils are equal, round, and reactive to light. Right eye exhibits no discharge. Left eye exhibits no discharge. No scleral icterus.  Neck: Normal range of motion. No JVD present. No thyromegaly present.  Cardiovascular: Normal rate, regular rhythm, normal heart sounds and intact distal pulses.  Exam reveals no gallop and no friction rub.   No murmur heard. Pulmonary/Chest: Effort normal and breath sounds  normal. No respiratory distress. He has no wheezes.  O2 2 liters via Nasal cannula  Abdominal: Soft. Bowel sounds are normal. He exhibits no distension. There is no tenderness. There is no rebound and no guarding.  Genitourinary:  Genitourinary Comments: Continent   Musculoskeletal: Normal range of motion. He exhibits no tenderness or deformity.  Bilateral lower extremities 2+ edema   Lymphadenopathy:    He has no cervical adenopathy.  Neurological: He is oriented to person, place, and time.  Skin: Skin is warm and dry. No rash noted. No erythema. No pallor.  Psychiatric: He has a normal mood and affect.    Labs reviewed:  Recent Labs  11/16/15 0535  02/07/16 0403 02/09/16 2244 02/10/16 0619 02/13/16 0521  NA 137  < > 140 139 139  --   K 3.8  < > 3.8 3.6 3.6  --   CL 100*  < > 105 107 108  --   CO2 28  < > 29 29 29   --   GLUCOSE 241*  < > 128* 95 79  --   BUN 11  < > 13 10 8   --   CREATININE 1.23  < > 1.08 0.99 0.87 0.97  CALCIUM 8.8*  < > 9.4 8.6* 8.5*  --   MG 1.7  --   --   --   --   --   PHOS 4.3  --   --   --   --   --   < > = values in this interval not displayed.  Recent Labs  02/05/16 1519 02/07/16 0403 02/09/16 2244  AST 20 19 21   ALT 12* 12* 13*  ALKPHOS 89 90 67  BILITOT 0.7 0.8 0.4  PROT 7.3 7.3 5.9*  ALBUMIN 3.8 3.9 3.3*    Recent Labs  12/21/15 2030 01/01/16 1003  02/07/16 0403 02/09/16 2244 02/10/16 0619 02/13/16 0521  WBC 5.9 5.5  < > 7.4 5.8 4.9 6.2  NEUTROABS 3.7 3.4  --  4.1  --   --   --   HGB 13.2 13.0  < > 14.5 12.1* 12.4* 12.0*  HCT 38.9* 38.5*  < > 41.8 34.5* 35.9* 35.2*  MCV 87.2 88.1  < > 89.7  89.4 90.8 90.5  PLT 175 156  < > 166 173 140* 141*  < > = values in this interval not displayed. Lab Results  Component Value Date   TSH 0.821 02/10/2016   Lab Results  Component Value Date   HGBA1C 7.4 (H) 02/10/2016   Lab Results  Component Value Date   CHOL 89 06/06/2013   HDL 37 (L) 06/06/2013   LDLCALC 33 06/06/2013    TRIG 95 06/06/2013    Significant Diagnostic Results in last 30 days:  Ct Head Wo Contrast  Result Date: 02/11/2016 CLINICAL DATA:  Left leg weakness beginning this morning with right toe pain. Dizziness. EXAM: CT HEAD WITHOUT CONTRAST TECHNIQUE: Contiguous axial images were obtained from the base of the skull through the vertex without intravenous contrast. COMPARISON:  02/10/2016 and 11/16/2015 FINDINGS: Ventricles, cisterns and other CSF spaces are within normal. There is no mass, mass effect, shift of midline structures or acute hemorrhage. There is evidence of chronic ischemic microvascular disease. No evidence of acute infarction. Remaining bones and soft tissues are within normal. IMPRESSION: No acute intracranial findings. Chronic ischemic microvascular disease. Electronically Signed   By: Elberta Fortis M.D.   On: 02/11/2016 15:10  Ct Head Wo Contrast  Result Date: 02/10/2016 CLINICAL DATA:  82 year old male with altered mental status EXAM: CT HEAD WITHOUT CONTRAST TECHNIQUE: Contiguous axial images were obtained from the base of the skull through the vertex without intravenous contrast. COMPARISON:  CT dated 11/16/2015 FINDINGS: The ventricles and sulci are appropriate in size for patient's age. Moderate periventricular and deep white matter chronic microvascular ischemic changes noted. There is no acute intracranial hemorrhage. No mass effect or midline shift noted. There is mild mucoperiosteal thickening of paranasal sinuses. No air-fluid levels. The mastoid air cells are clear. The calvarium is intact. IMPRESSION: No acute intracranial hemorrhage. Age-related atrophy and chronic microvascular ischemic disease. If symptoms persist and there are no contraindications, MRI may provide better evaluation if clinically indicated. Electronically Signed   By: Elgie Collard M.D.   On: 02/10/2016 03:17  Dg Chest Portable 1 View  Result Date: 02/09/2016 CLINICAL DATA:  Unresponsive EXAM: PORTABLE  CHEST 1 VIEW COMPARISON:  02/07/2016 FINDINGS: Cardiac shadow remains enlarged. The lungs are again well aerated without focal infiltrate or sizable effusion. Minimal left basilar atelectasis is noted. Postsurgical changes in the cervical spine are seen. Aortic calcifications are again noted. IMPRESSION: Minimal left basilar atelectasis. Aortic atherosclerosis Electronically Signed   By: Alcide Clever M.D.   On: 02/09/2016 23:17  Dg Abd Acute W/chest  Result Date: 02/07/2016 CLINICAL DATA:  Pain and nausea EXAM: DG ABDOMEN ACUTE W/ 1V CHEST COMPARISON:  01/01/2016 chest x-ray.  Abdominal CT 12/21/2015 FINDINGS: Chronic cardiomegaly and aortic tortuosity. There is no edema, consolidation, effusion, or pneumothorax. Calcified granuloma at the left base. Nonobstructive bowel gas pattern. No concern for pneumatosis or pneumoperitoneum. Vascular calcifications. No evidence of urolithiasis. Cholecystectomy changes. IMPRESSION: No acute findings in the chest or abdomen. Electronically Signed   By: Marnee Spring M.D.   On: 02/07/2016 03:46    Assessment/Plan CHF Has had 7 pound weight gain over 6 days. No wheezing, rales or shortness of breath noted during the visit. Continue on Coreg 12.5 mg Tablet. Change Furosemide 40 mg Tablet to one by mouth daily. Continue Potassium chloride. BMP 01/30/2016.    Edema  Change Furosemide 40 mg Tablet to one by mouth daily. Apply bilateral Knee high Ted hose on in the morning and off at bedtime.  Weight gain  Monitor daily weight.    Family/ staff Communication:Reviewed plan of care with patient and facility Nurse supervisor.  Labs/tests ordered:  BMP 01/30/2016

## 2016-02-25 ENCOUNTER — Non-Acute Institutional Stay (SKILLED_NURSING_FACILITY): Payer: Medicare Other | Admitting: Family

## 2016-02-25 DIAGNOSIS — I482 Chronic atrial fibrillation, unspecified: Secondary | ICD-10-CM

## 2016-02-25 DIAGNOSIS — IMO0001 Reserved for inherently not codable concepts without codable children: Secondary | ICD-10-CM

## 2016-02-25 DIAGNOSIS — J449 Chronic obstructive pulmonary disease, unspecified: Secondary | ICD-10-CM | POA: Diagnosis not present

## 2016-02-25 DIAGNOSIS — I5022 Chronic systolic (congestive) heart failure: Secondary | ICD-10-CM | POA: Diagnosis not present

## 2016-02-25 DIAGNOSIS — I2510 Atherosclerotic heart disease of native coronary artery without angina pectoris: Secondary | ICD-10-CM

## 2016-02-25 DIAGNOSIS — E1165 Type 2 diabetes mellitus with hyperglycemia: Secondary | ICD-10-CM | POA: Diagnosis not present

## 2016-02-25 DIAGNOSIS — K219 Gastro-esophageal reflux disease without esophagitis: Secondary | ICD-10-CM | POA: Diagnosis not present

## 2016-02-25 NOTE — Progress Notes (Signed)
Location:   [facility]  Nursing Home Room Number: 103  Place of Service:  SNF [phone])  Provider: Richarda Blade FNP-C   PCP: [name]., MD Patient Care Team: [name], MD as PCP - General (Cardiology)  Extended Emergency Contact Information Primary Emergency Contact: Gavin Potters Address: [address]          [place], [place] [address] [place] Home Phone: [phone] Relation: Son Secondary Emergency Contact: Charlott Rakes [place] Home Phone: [phone] Mobile Phone: [phone] Relation: None  Code Status: Full Code  Goals of care:  Advanced Directive information Advanced Directives 02/10/2016  Does patient have an advance directive? No  Type of Advance Directive -  Does patient want to make changes to advanced directive? -  Copy of advanced directive(s) in chart? -  Would patient like information on creating an advanced directive? -     Allergies  Allergen Reactions  . Aldactone [Spironolactone] Hives  . Aspirin Hives  . Ciprofloxacin Hives  . Cyclobenzaprine Hives  . Lexapro [Escitalopram] Hives    Chief Complaint  Patient presents with  . Discharge Note    HPI:  82 y.o. male seen today at  [facility] for discharge home. He was here for short term rehabilitation post hospital admission from 02/09/16-02/14/16 with hypoglycemia, bradycardia and COPD exacerbation. His insulin dosing was adjusted. He was taken off b blocker and was treated with steroids and breathing treatment. He made clinical improvement.He has a significant medical history of HTN, CHF, Afib, CAD, COPD, GERD, type 2 DM among other conditions. His condition has improved here at Rehab. He complained of chest pain recently and was given Nitro with relief of symptoms. He states he gets chronic chest pains occasional at home has own Nitro. EKG done showed old MI infarct and Bradycardia.No changes from previous EKG. HR  stable this visit.He has worked well with PT/OT now stable for discharge home.He will be discharged home with Home health PT/OT to continue with ROM, Exercise, Gait stability and muscle strengthening. He does not need any DME he has own FWW. Home health services will be arranged by facility social worker prior to discharge. Prescription medication will be written x 1 month then patient to follow up with PCP in 1-2 weeks.Facility staff report no new concerns    Past Medical History:  Diagnosis Date  . A-fib (HCC)   . CHF (congestive heart failure) (HCC)   . COPD (chronic obstructive pulmonary disease) (HCC)   . Diabetes mellitus without complication (HCC)   . High cholesterol   . Hypertension     Past Surgical History:  Procedure Laterality Date  . BACK SURGERY    . CARPAL TUNNEL RELEASE    . CHOLECYSTECTOMY        reports that he has quit smoking. He has never used smokeless tobacco. He reports that he does not drink alcohol or use drugs. Social History   Social History  . Marital status: Widowed    Spouse name: N/A  . Number of children: N/A  . Years of education: N/A   Occupational History  . retired    Social History Main Topics  . Smoking status: Former Games developer  . Smokeless tobacco: Never Used  . Alcohol use No  . Drug use: No  . Sexual activity: Not on file   Other Topics Concern  . Not on file   Social History Narrative  . No narrative  on file    Allergies  Allergen Reactions  . Aldactone [Spironolactone] Hives  . Aspirin Hives  . Ciprofloxacin Hives  . Cyclobenzaprine Hives  . Lexapro [Escitalopram] Hives    Pertinent  Health Maintenance Due  Topic Date Due  . FOOT EXAM  10/08/1936  . OPHTHALMOLOGY EXAM  10/08/1936  . PNA vac Low Risk Adult (1 of 2 - PCV13) 10/07/1993  . INFLUENZA VACCINE  02/17/2016  . HEMOGLOBIN A1C  08/12/2016    Medications:   Medication List       Accurate as of 02/25/16  7:10 PM. Always use your most recent med list.            ADVAIR DISKUS 250-50 MCG/DOSE Aepb Generic drug:  Fluticasone-Salmeterol Inhale 1 puff into the lungs daily as needed (for wheezing or shortness).   apixaban 5 MG Tabs tablet Commonly known as:  ELIQUIS Take 1 tablet (5 mg total) by mouth 2 (two) times daily.   brimonidine 0.2 % ophthalmic solution Commonly known as:  ALPHAGAN Place 1 drop into both eyes daily.   carvedilol 6.25 MG tablet Commonly known as:  COREG Take 6.25 mg by mouth 2 (two) times daily with a meal.   clopidogrel 75 MG tablet Commonly known as:  PLAVIX Take 75 mg by mouth daily.   furosemide 40 MG tablet Commonly known as:  LASIX Take 1 tablet (40 mg total) by mouth daily.   gabapentin 300 MG capsule Commonly known as:  NEURONTIN Take 300 mg by mouth at bedtime.   insulin glargine 100 UNIT/ML injection Commonly known as:  LANTUS Inject 0.08 mLs (8 Units total) into the skin at bedtime.   insulin lispro 100 UNIT/ML injection Commonly known as:  HUMALOG Inject 0-9 Units into the skin 3 (three) times daily before meals. CBG's 70-120 = 0 units; 121-150 =1 units; 151-200 = 2 units; 201-250 =3 units; 251-300 =5 units; 301-350 =7 units; 351-400=9 units and > 400 Notify provider.   ipratropium-albuterol 0.5-2.5 (3) MG/3ML Soln Commonly known as:  DUONEB Take 3 mLs by nebulization every 6 (six) hours as needed (for SOB).   ketorolac 0.5 % ophthalmic solution Commonly known as:  ACULAR Place 1 drop into the right eye 4 (four) times daily.   losartan 100 MG tablet Commonly known as:  COZAAR Take 100 mg by mouth daily.   nateglinide 120 MG tablet Commonly known as:  STARLIX Take 120 mg by mouth daily.   Omega-3 1000 MG Caps Take 1 g by mouth daily.   pantoprazole 40 MG tablet Commonly known as:  PROTONIX Take 40 mg by mouth daily.   polyvinyl alcohol 1.4 % ophthalmic solution Commonly known as:  LIQUIFILM TEARS Place 1 drop into both eyes as needed for dry eyes. Reported on 12/21/2015    potassium chloride SA 20 MEQ tablet Commonly known as:  K-DUR,KLOR-CON Take 20 mEq by mouth daily.   predniSONE 10 MG (21) Tbpk tablet Commonly known as:  STERAPRED UNI-PAK 21 TAB Take 1 tablet (10 mg total) by mouth daily. 40mg  x1 day, 20mg  x2 day, 10mg  x2 day, then stop   PROAIR HFA 108 (90 Base) MCG/ACT inhaler Generic drug:  albuterol Inhale 2 puffs into the lungs every 4 (four) hours as needed for wheezing or shortness of breath.   promethazine 25 MG tablet Commonly known as:  PHENERGAN Take 12.5-25 mg by mouth every 4 (four) hours as needed.   simvastatin 40 MG tablet Commonly known as:  ZOCOR Take 40 mg by  mouth every evening.   SPIRIVA HANDIHALER 18 MCG inhalation capsule Generic drug:  tiotropium Place 1 application into inhaler and inhale daily.   terazosin 5 MG capsule Commonly known as:  HYTRIN Take 5 mg by mouth every evening.   triamcinolone cream 0.1 % Commonly known as:  KENALOG Apply 1 application topically 2 (two) times daily as needed (for irritation).   trimethoprim-polymyxin b ophthalmic solution Commonly known as:  POLYTRIM Place 2 drops into the right eye every 6 (six) hours.   zolpidem 10 MG tablet Commonly known as:  AMBIEN Take 10 mg by mouth at bedtime as needed for sleep.       Review of Systems  Constitutional: Negative for activity change, appetite change, chills, fatigue and fever.  HENT: Negative for congestion, rhinorrhea, sinus pressure, sneezing and sore throat.   Eyes: Negative.   Respiratory: Negative for cough, chest tightness and wheezing.          oxygen 2 L/[place]   Cardiovascular: Negative for chest pain, palpitations and leg swelling.  Gastrointestinal: Negative for abdominal distention, abdominal pain, constipation, diarrhea, nausea and vomiting.  Endocrine: Negative for cold intolerance, heat intolerance, polydipsia, polyphagia and polyuria.  Genitourinary: Negative for dysuria, flank pain, frequency and urgency.   Musculoskeletal: Positive for gait problem.       Uses walker   Skin: Negative for color change, pallor and rash.  Neurological: Negative for dizziness, seizures, syncope, light-headedness and headaches.  Hematological: Does not bruise/bleed easily.  Psychiatric/Behavioral: Negative for agitation, confusion, hallucinations and sleep disturbance. The patient is not nervous/anxious.     Vitals:   02/25/16 1858  BP: 122/70  Pulse: 74  Resp: 18  Temp: 98 F (36.7 C)  SpO2: 98%  Weight: 207 lb 3.2 oz (94 kg)  Height: 5\' 6"  (1.676 m)   Body mass index is 33.44 kg/m. Physical Exam  Constitutional: He is oriented to person, place, and time.  Obese Elderly in no acute distress.   HENT:  Head: Normocephalic.  Mouth/Throat: Oropharynx is clear and moist. No oropharyngeal exudate.  Eyes: Conjunctivae and EOM are normal. Pupils are equal, round, and reactive to light. Right eye exhibits no discharge. Left eye exhibits no discharge. No scleral icterus.  Neck: Normal range of motion. No JVD present. No thyromegaly present.  Cardiovascular: Normal rate, regular rhythm, normal heart sounds and intact distal pulses.  Exam reveals no gallop and no friction rub.   No murmur heard. Pulmonary/Chest: Effort normal and breath sounds normal. No respiratory distress. He has no wheezes.  O2 2 liters via Nasal cannula  Abdominal: Soft. Bowel sounds are normal. He exhibits no distension. There is no tenderness. There is no rebound and no guarding.  Genitourinary:  Genitourinary Comments: Continent   Musculoskeletal: Normal range of motion. He exhibits no tenderness or deformity.  Bilateral lower extremities knee high Ted hose in place.   Lymphadenopathy:    He has no cervical adenopathy.  Neurological: He is oriented to person, place, and time.  Skin: Skin is warm and dry. No rash noted. No erythema. No pallor.  Psychiatric: He has a normal mood and affect.    Labs reviewed: Basic Metabolic  Panel:  Recent Labs  11/16/15 0535  02/07/16 0403 02/09/16 2244 02/10/16 0619 02/13/16 0521  NA 137  < > 140 139 139  --   K 3.8  < > 3.8 3.6 3.6  --   CL 100*  < > 105 107 108  --   CO2 28  < >  --   GLUCOSE 241*  < > 128* 95 79  --   BUN 11  < > --   CREATININE 1.23  < > 1.08 0.99 0.87 0.97  CALCIUM 8.8*  < > 9.4 8.6* 8.5*  --   MG 1.7  --   --   --   --   --   PHOS 4.3  --   --   --   --   --   < > = values in this interval not displayed. Liver Function Tests:  Recent Labs  02/05/16 1519 02/07/16 0403 02/09/16 2244  AST ALT 12* 12* 13*  ALKPHOS 89 90 67  BILITOT 0.7 0.8 0.4  PROT 7.3 7.3 5.9*  ALBUMIN 3.8 3.9 3.3*    Recent Labs  12/21/15 2030 02/05/16 1519 02/07/16 0403  LIPASE CBC:  Recent Labs  12/21/15 2030 01/01/16 1003  02/07/16 0403 02/09/16 2244 02/10/16 0619 02/13/16 0521  WBC 5.9 5.5  < > 7.4 5.8 4.9 6.2  NEUTROABS 3.7 3.4  --  4.1  --   --   --   HGB 13.2 13.0  < > 14.5 12.1* 12.4* 12.0*  HCT 38.9* 38.5*  < > 41.8 34.5* 35.9* 35.2*  MCV 87.2 88.1  < > 89.7 89.4 90.8 90.5  PLT 175 156  < > 166 173 140* 141*  < > = values in this interval not displayed. Cardiac Enzymes:  Recent Labs  02/10/16 0619 02/10/16 1237 02/10/16 1759  TROPONINI 0.03* 0.03* <0.03   Recent Labs  02/13/16 2118 02/14/16 0747 02/14/16 1227  GLUCAP 282* 174* 182*   Assessment/Plan:   1. Chronic systolic congestive heart failure (HCC) Stable. Exam findings negative for edema, shortness of breath or wheezing. Continue on Coreg 12.5 mg Tablet. Change Furosemide 40 mg Tablet to one by mouth daily. Continue Potassium chloride. BMP in 1-2 weeks with PCP  2. Chronic atrial fibrillation (HCC) HR controlled. Continue on Apixaban.   3. Coronary artery disease involving native coronary artery of native heart without angina pectoris Has chronic chest pain  X 1 episode relieved by Nitro.continue with Nitro encouraged to call 911  or go to ED if no relief. No changes in EKG from previous.    4. Chronic obstructive pulmonary disease, unspecified COPD type (HCC) Stable. Continue on Advair, Spiriva and DuoNeb.   5. Gastroesophageal reflux disease without esophagitis Continue on Omeprazole.   6. Uncontrolled diabetes mellitus type 2 without complications, unspecified long term insulin use status (HCC) Continue on Humalog per SSI and  Lantus to 10 units SQ at bedtime.  Hgb A1C with PCP.   7. Abnormal Gait   Has worked well with PT/ OT. Will discharge home PT/OT to continue with ROM, Exercise, Gait stability and muscle strengthening. No DME required has own FWW. Fall and safety precautions.    Patient is being discharged with the following home health services:   PT/OT to continue with ROM, Exercise, Gait stability and muscle strengthening.  Patient is being discharged with the following durable medical equipment:    None required has own FWW and oxygen concentrator at home.   Patient has been advised to f/u with their PCP in 1-2 weeks to bring them up to date on their rehab stay.  Social services at facility was responsible for arranging this appointment.  Pt was provided with a 30 day supply of prescriptions for medications  and refills must be obtained from their PCP.  For controlled substances, a more limited supply may be provided adequate until PCP appointment only.  Future labs/tests needed: CBC, BMP in 1-2 weeks with PCP

## 2016-02-29 DIAGNOSIS — E11649 Type 2 diabetes mellitus with hypoglycemia without coma: Secondary | ICD-10-CM

## 2016-02-29 DIAGNOSIS — I10 Essential (primary) hypertension: Secondary | ICD-10-CM

## 2016-02-29 DIAGNOSIS — I482 Chronic atrial fibrillation: Secondary | ICD-10-CM

## 2016-02-29 DIAGNOSIS — J441 Chronic obstructive pulmonary disease with (acute) exacerbation: Secondary | ICD-10-CM

## 2016-02-29 DIAGNOSIS — K219 Gastro-esophageal reflux disease without esophagitis: Secondary | ICD-10-CM | POA: Diagnosis not present

## 2016-02-29 DIAGNOSIS — I2510 Atherosclerotic heart disease of native coronary artery without angina pectoris: Secondary | ICD-10-CM | POA: Diagnosis not present

## 2016-03-22 ENCOUNTER — Emergency Department (HOSPITAL_COMMUNITY): Payer: Medicare Other

## 2016-03-22 ENCOUNTER — Encounter (HOSPITAL_COMMUNITY): Payer: Self-pay | Admitting: Emergency Medicine

## 2016-03-22 ENCOUNTER — Inpatient Hospital Stay (HOSPITAL_COMMUNITY)
Admission: EM | Admit: 2016-03-22 | Discharge: 2016-03-26 | DRG: 065 | Disposition: A | Discharge status: Skilled Nursing Facility | Payer: Medicare Other | Attending: Family Medicine | Admitting: Family Medicine

## 2016-03-22 DIAGNOSIS — K219 Gastro-esophageal reflux disease without esophagitis: Secondary | ICD-10-CM | POA: Diagnosis present

## 2016-03-22 DIAGNOSIS — E669 Obesity, unspecified: Secondary | ICD-10-CM | POA: Diagnosis present

## 2016-03-22 DIAGNOSIS — I480 Paroxysmal atrial fibrillation: Secondary | ICD-10-CM | POA: Diagnosis present

## 2016-03-22 DIAGNOSIS — I708 Atherosclerosis of other arteries: Secondary | ICD-10-CM | POA: Diagnosis present

## 2016-03-22 DIAGNOSIS — R079 Chest pain, unspecified: Secondary | ICD-10-CM | POA: Diagnosis present

## 2016-03-22 DIAGNOSIS — I255 Ischemic cardiomyopathy: Secondary | ICD-10-CM | POA: Diagnosis present

## 2016-03-22 DIAGNOSIS — R42 Dizziness and giddiness: Secondary | ICD-10-CM

## 2016-03-22 DIAGNOSIS — K30 Functional dyspepsia: Secondary | ICD-10-CM | POA: Diagnosis present

## 2016-03-22 DIAGNOSIS — I5042 Chronic combined systolic (congestive) and diastolic (congestive) heart failure: Secondary | ICD-10-CM | POA: Diagnosis present

## 2016-03-22 DIAGNOSIS — Z886 Allergy status to analgesic agent status: Secondary | ICD-10-CM

## 2016-03-22 DIAGNOSIS — I2510 Atherosclerotic heart disease of native coronary artery without angina pectoris: Secondary | ICD-10-CM | POA: Diagnosis present

## 2016-03-22 DIAGNOSIS — Z7901 Long term (current) use of anticoagulants: Secondary | ICD-10-CM

## 2016-03-22 DIAGNOSIS — I447 Left bundle-branch block, unspecified: Secondary | ICD-10-CM | POA: Diagnosis present

## 2016-03-22 DIAGNOSIS — E1165 Type 2 diabetes mellitus with hyperglycemia: Secondary | ICD-10-CM | POA: Diagnosis present

## 2016-03-22 DIAGNOSIS — Z888 Allergy status to other drugs, medicaments and biological substances status: Secondary | ICD-10-CM

## 2016-03-22 DIAGNOSIS — E7800 Pure hypercholesterolemia, unspecified: Secondary | ICD-10-CM | POA: Diagnosis present

## 2016-03-22 DIAGNOSIS — Z6832 Body mass index (BMI) 32.0-32.9, adult: Secondary | ICD-10-CM

## 2016-03-22 DIAGNOSIS — R1013 Epigastric pain: Secondary | ICD-10-CM

## 2016-03-22 DIAGNOSIS — I638 Other cerebral infarction: Principal | ICD-10-CM | POA: Diagnosis present

## 2016-03-22 DIAGNOSIS — Z87891 Personal history of nicotine dependence: Secondary | ICD-10-CM

## 2016-03-22 DIAGNOSIS — I4891 Unspecified atrial fibrillation: Secondary | ICD-10-CM | POA: Diagnosis present

## 2016-03-22 DIAGNOSIS — R109 Unspecified abdominal pain: Secondary | ICD-10-CM | POA: Diagnosis present

## 2016-03-22 DIAGNOSIS — J449 Chronic obstructive pulmonary disease, unspecified: Secondary | ICD-10-CM | POA: Diagnosis present

## 2016-03-22 DIAGNOSIS — I4581 Long QT syndrome: Secondary | ICD-10-CM | POA: Diagnosis present

## 2016-03-22 DIAGNOSIS — E1149 Type 2 diabetes mellitus with other diabetic neurological complication: Secondary | ICD-10-CM | POA: Diagnosis present

## 2016-03-22 DIAGNOSIS — Z794 Long term (current) use of insulin: Secondary | ICD-10-CM

## 2016-03-22 DIAGNOSIS — E876 Hypokalemia: Secondary | ICD-10-CM | POA: Diagnosis not present

## 2016-03-22 DIAGNOSIS — Y92230 Patient room in hospital as the place of occurrence of the external cause: Secondary | ICD-10-CM | POA: Diagnosis not present

## 2016-03-22 DIAGNOSIS — Z7951 Long term (current) use of inhaled steroids: Secondary | ICD-10-CM

## 2016-03-22 DIAGNOSIS — T502X5A Adverse effect of carbonic-anhydrase inhibitors, benzothiadiazides and other diuretics, initial encounter: Secondary | ICD-10-CM | POA: Diagnosis not present

## 2016-03-22 DIAGNOSIS — I482 Chronic atrial fibrillation: Secondary | ICD-10-CM | POA: Diagnosis not present

## 2016-03-22 DIAGNOSIS — I6789 Other cerebrovascular disease: Secondary | ICD-10-CM | POA: Diagnosis present

## 2016-03-22 DIAGNOSIS — Z79899 Other long term (current) drug therapy: Secondary | ICD-10-CM

## 2016-03-22 DIAGNOSIS — Z9114 Patient's other noncompliance with medication regimen: Secondary | ICD-10-CM

## 2016-03-22 DIAGNOSIS — I952 Hypotension due to drugs: Secondary | ICD-10-CM | POA: Diagnosis not present

## 2016-03-22 DIAGNOSIS — I700 Atherosclerosis of aorta: Secondary | ICD-10-CM | POA: Diagnosis present

## 2016-03-22 DIAGNOSIS — Z7902 Long term (current) use of antithrombotics/antiplatelets: Secondary | ICD-10-CM

## 2016-03-22 DIAGNOSIS — Z881 Allergy status to other antibiotic agents status: Secondary | ICD-10-CM

## 2016-03-22 DIAGNOSIS — Z8042 Family history of malignant neoplasm of prostate: Secondary | ICD-10-CM

## 2016-03-22 DIAGNOSIS — I25118 Atherosclerotic heart disease of native coronary artery with other forms of angina pectoris: Secondary | ICD-10-CM | POA: Diagnosis present

## 2016-03-22 DIAGNOSIS — I10 Essential (primary) hypertension: Secondary | ICD-10-CM | POA: Diagnosis present

## 2016-03-22 DIAGNOSIS — N400 Enlarged prostate without lower urinary tract symptoms: Secondary | ICD-10-CM | POA: Diagnosis present

## 2016-03-22 DIAGNOSIS — I639 Cerebral infarction, unspecified: Secondary | ICD-10-CM | POA: Diagnosis present

## 2016-03-22 DIAGNOSIS — R29700 NIHSS score 0: Secondary | ICD-10-CM | POA: Diagnosis present

## 2016-03-22 DIAGNOSIS — I110 Hypertensive heart disease with heart failure: Secondary | ICD-10-CM | POA: Diagnosis present

## 2016-03-22 HISTORY — DX: Chronic combined systolic (congestive) and diastolic (congestive) heart failure: I50.42

## 2016-03-22 LAB — CBC WITH DIFFERENTIAL/PLATELET
BASOS PCT: 1 %
Basophils Absolute: 0 10*3/uL (ref 0.0–0.1)
Eosinophils Absolute: 0.1 10*3/uL (ref 0.0–0.7)
Eosinophils Relative: 2 %
HEMATOCRIT: 40.8 % (ref 39.0–52.0)
Hemoglobin: 13.5 g/dL (ref 13.0–17.0)
LYMPHS PCT: 31 %
Lymphs Abs: 1.8 10*3/uL (ref 0.7–4.0)
MCH: 30.1 pg (ref 26.0–34.0)
MCHC: 33.1 g/dL (ref 30.0–36.0)
MCV: 91.1 fL (ref 78.0–100.0)
Monocytes Absolute: 0.4 10*3/uL (ref 0.1–1.0)
Monocytes Relative: 7 %
NEUTROS ABS: 3.5 10*3/uL (ref 1.7–7.7)
Neutrophils Relative %: 59 %
PLATELETS: 173 10*3/uL (ref 150–400)
RBC: 4.48 MIL/uL (ref 4.22–5.81)
RDW: 12.6 % (ref 11.5–15.5)
WBC: 5.9 10*3/uL (ref 4.0–10.5)

## 2016-03-22 LAB — RAPID URINE DRUG SCREEN, HOSP PERFORMED
Amphetamines: NOT DETECTED
BARBITURATES: NOT DETECTED
Benzodiazepines: NOT DETECTED
Cocaine: NOT DETECTED
Opiates: NOT DETECTED
Tetrahydrocannabinol: NOT DETECTED

## 2016-03-22 LAB — GLUCOSE, CAPILLARY: Glucose-Capillary: 144 mg/dL — ABNORMAL HIGH (ref 65–99)

## 2016-03-22 LAB — LIPASE, BLOOD: Lipase: 16 U/L (ref 11–51)

## 2016-03-22 LAB — URINALYSIS, ROUTINE W REFLEX MICROSCOPIC
BILIRUBIN URINE: NEGATIVE
Glucose, UA: 250 mg/dL — AB
Ketones, ur: NEGATIVE mg/dL
Leukocytes, UA: NEGATIVE
NITRITE: NEGATIVE
PROTEIN: 30 mg/dL — AB
SPECIFIC GRAVITY, URINE: 1.015 (ref 1.005–1.030)
pH: 6 (ref 5.0–8.0)

## 2016-03-22 LAB — URINE MICROSCOPIC-ADD ON

## 2016-03-22 LAB — COMPREHENSIVE METABOLIC PANEL
ALK PHOS: 77 U/L (ref 38–126)
ALT: 13 U/L — ABNORMAL LOW (ref 17–63)
ANION GAP: 6 (ref 5–15)
AST: 16 U/L (ref 15–41)
Albumin: 3.5 g/dL (ref 3.5–5.0)
BILIRUBIN TOTAL: 0.7 mg/dL (ref 0.3–1.2)
BUN: 8 mg/dL (ref 6–20)
CALCIUM: 9.3 mg/dL (ref 8.9–10.3)
CO2: 24 mmol/L (ref 22–32)
Chloride: 106 mmol/L (ref 101–111)
Creatinine, Ser: 0.97 mg/dL (ref 0.61–1.24)
Glucose, Bld: 196 mg/dL — ABNORMAL HIGH (ref 65–99)
POTASSIUM: 3.6 mmol/L (ref 3.5–5.1)
Sodium: 136 mmol/L (ref 135–145)
TOTAL PROTEIN: 6.3 g/dL — AB (ref 6.5–8.1)

## 2016-03-22 LAB — I-STAT TROPONIN, ED: Troponin i, poc: 0 ng/mL (ref 0.00–0.08)

## 2016-03-22 LAB — TROPONIN I: Troponin I: 0.03 ng/mL (ref <0.03)

## 2016-03-22 LAB — BRAIN NATRIURETIC PEPTIDE: B NATRIURETIC PEPTIDE 5: 482.9 pg/mL — AB (ref 0.0–100.0)

## 2016-03-22 LAB — PROTIME-INR
INR: 1.09
PROTHROMBIN TIME: 14.1 s (ref 11.4–15.2)

## 2016-03-22 MED ORDER — POLYVINYL ALCOHOL 1.4 % OP SOLN
1.0000 [drp] | OPHTHALMIC | Status: DC | PRN
  Filled 2016-03-22: qty 15

## 2016-03-22 MED ORDER — TIOTROPIUM BROMIDE MONOHYDRATE 18 MCG IN CAPS
18.0000 ug | ORAL_CAPSULE | Freq: Every day | RESPIRATORY_TRACT | Status: DC
  Administered 2016-03-24 – 2016-03-26 (×3): 18 ug via RESPIRATORY_TRACT
  Filled 2016-03-22 (×2): qty 5

## 2016-03-22 MED ORDER — GABAPENTIN 300 MG PO CAPS
300.0000 mg | ORAL_CAPSULE | Freq: Every day | ORAL | Status: DC
  Administered 2016-03-22 – 2016-03-25 (×4): 300 mg via ORAL
  Filled 2016-03-22 (×4): qty 1

## 2016-03-22 MED ORDER — LATANOPROST 0.005 % OP SOLN
1.0000 [drp] | Freq: Every day | OPHTHALMIC | Status: DC
  Administered 2016-03-22 – 2016-03-25 (×3): 1 [drp] via OPHTHALMIC
  Filled 2016-03-22 (×2): qty 2.5

## 2016-03-22 MED ORDER — MOMETASONE FURO-FORMOTEROL FUM 200-5 MCG/ACT IN AERO
2.0000 | INHALATION_SPRAY | Freq: Two times a day (BID) | RESPIRATORY_TRACT | Status: DC
  Administered 2016-03-23 – 2016-03-26 (×8): 2 via RESPIRATORY_TRACT
  Filled 2016-03-22 (×2): qty 8.8

## 2016-03-22 MED ORDER — HYDROXYZINE HCL 50 MG/ML IM SOLN
25.0000 mg | Freq: Four times a day (QID) | INTRAMUSCULAR | Status: DC | PRN
  Filled 2016-03-22: qty 0.5

## 2016-03-22 MED ORDER — ACETAMINOPHEN 325 MG PO TABS: 650.0000 mg | ORAL_TABLET | ORAL | Status: DC | PRN

## 2016-03-22 MED ORDER — CARVEDILOL 6.25 MG PO TABS
6.2500 mg | ORAL_TABLET | Freq: Two times a day (BID) | ORAL | Status: DC
  Administered 2016-03-23 – 2016-03-25 (×6): 6.25 mg via ORAL
  Filled 2016-03-22 (×7): qty 1

## 2016-03-22 MED ORDER — NITROGLYCERIN 0.4 MG SL SUBL
0.4000 mg | SUBLINGUAL_TABLET | SUBLINGUAL | Status: DC | PRN
  Filled 2016-03-22: qty 1

## 2016-03-22 MED ORDER — OMEGA-3 1000 MG PO CAPS: 1000.0000 mg | ORAL_CAPSULE | Freq: Every day | ORAL | Status: DC

## 2016-03-22 MED ORDER — DM-GUAIFENESIN ER 30-600 MG PO TB12
1.0000 | ORAL_TABLET | Freq: Two times a day (BID) | ORAL | Status: DC
  Administered 2016-03-22 – 2016-03-26 (×8): 1 via ORAL
  Filled 2016-03-22 (×8): qty 1

## 2016-03-22 MED ORDER — OMEGA-3-ACID ETHYL ESTERS 1 G PO CAPS
1.0000 g | ORAL_CAPSULE | Freq: Every day | ORAL | Status: DC
  Administered 2016-03-23 – 2016-03-26 (×4): 1 g via ORAL
  Filled 2016-03-22 (×4): qty 1

## 2016-03-22 MED ORDER — HYDRALAZINE HCL 20 MG/ML IJ SOLN: 5.0000 mg | INTRAMUSCULAR | Status: DC | PRN

## 2016-03-22 MED ORDER — FUROSEMIDE 10 MG/ML IJ SOLN
40.0000 mg | Freq: Once | INTRAMUSCULAR | Status: AC
  Administered 2016-03-22: 40 mg via INTRAVENOUS
  Filled 2016-03-22: qty 4

## 2016-03-22 MED ORDER — INSULIN ASPART 100 UNIT/ML ~~LOC~~ SOLN
0.0000 [IU] | Freq: Three times a day (TID) | SUBCUTANEOUS | Status: DC
  Administered 2016-03-23 (×2): 2 [IU] via SUBCUTANEOUS
  Administered 2016-03-23: 1 [IU] via SUBCUTANEOUS
  Administered 2016-03-24: 2 [IU] via SUBCUTANEOUS
  Administered 2016-03-24: 3 [IU] via SUBCUTANEOUS
  Administered 2016-03-24 – 2016-03-25 (×2): 2 [IU] via SUBCUTANEOUS
  Administered 2016-03-25: 3 [IU] via SUBCUTANEOUS
  Administered 2016-03-26 (×3): 2 [IU] via SUBCUTANEOUS

## 2016-03-22 MED ORDER — HEPARIN SODIUM (PORCINE) 5000 UNIT/ML IJ SOLN
5000.0000 [IU] | Freq: Three times a day (TID) | INTRAMUSCULAR | Status: DC
  Administered 2016-03-22 – 2016-03-23 (×3): 5000 [IU] via SUBCUTANEOUS
  Filled 2016-03-22 (×3): qty 1

## 2016-03-22 MED ORDER — FUROSEMIDE 40 MG PO TABS
40.0000 mg | ORAL_TABLET | Freq: Every day | ORAL | Status: DC
  Administered 2016-03-23 – 2016-03-26 (×4): 40 mg via ORAL
  Filled 2016-03-22 (×4): qty 1

## 2016-03-22 MED ORDER — HYDROCHLOROTHIAZIDE 25 MG PO TABS
12.5000 mg | ORAL_TABLET | Freq: Every day | ORAL | Status: DC
  Administered 2016-03-23 – 2016-03-24 (×2): 12.5 mg via ORAL
  Filled 2016-03-22 (×2): qty 1

## 2016-03-22 MED ORDER — INSULIN GLARGINE 100 UNIT/ML ~~LOC~~ SOLN
7.0000 [IU] | Freq: Every day | SUBCUTANEOUS | Status: DC
  Administered 2016-03-22 – 2016-03-25 (×4): 7 [IU] via SUBCUTANEOUS
  Filled 2016-03-22 (×5): qty 0.07

## 2016-03-22 MED ORDER — SIMVASTATIN 40 MG PO TABS
40.0000 mg | ORAL_TABLET | Freq: Every evening | ORAL | Status: DC
  Administered 2016-03-22: 40 mg via ORAL
  Filled 2016-03-22: qty 1

## 2016-03-22 MED ORDER — PANTOPRAZOLE SODIUM 40 MG PO TBEC
40.0000 mg | DELAYED_RELEASE_TABLET | Freq: Every day | ORAL | Status: DC
  Administered 2016-03-23 – 2016-03-26 (×4): 40 mg via ORAL
  Filled 2016-03-22 (×4): qty 1

## 2016-03-22 MED ORDER — MORPHINE SULFATE (PF) 2 MG/ML IV SOLN: 2.0000 mg | INTRAVENOUS | Status: DC | PRN

## 2016-03-22 MED ORDER — TERAZOSIN HCL 5 MG PO CAPS
5.0000 mg | ORAL_CAPSULE | Freq: Every evening | ORAL | Status: DC
  Administered 2016-03-22 – 2016-03-26 (×5): 5 mg via ORAL
  Filled 2016-03-22 (×5): qty 1

## 2016-03-22 MED ORDER — IPRATROPIUM-ALBUTEROL 0.5-2.5 (3) MG/3ML IN SOLN: 3.0000 mL | Freq: Four times a day (QID) | RESPIRATORY_TRACT | Status: DC | PRN

## 2016-03-22 MED ORDER — TRIAMCINOLONE ACETONIDE 0.1 % EX CREA: 1.0000 "application " | TOPICAL_CREAM | Freq: Two times a day (BID) | CUTANEOUS | Status: DC | PRN

## 2016-03-22 MED ORDER — AMLODIPINE BESYLATE 5 MG PO TABS: 10.0000 mg | ORAL_TABLET | Freq: Every day | ORAL | Status: DC

## 2016-03-22 MED ORDER — OMEGA-3-ACID ETHYL ESTERS 1 G PO CAPS: 1.0000 g | ORAL_CAPSULE | Freq: Two times a day (BID) | ORAL | Status: DC

## 2016-03-22 MED ORDER — ZOLPIDEM TARTRATE 5 MG PO TABS: 10.0000 mg | ORAL_TABLET | Freq: Every evening | ORAL | Status: DC | PRN

## 2016-03-22 MED ORDER — AMLODIPINE BESYLATE 5 MG PO TABS
5.0000 mg | ORAL_TABLET | Freq: Every day | ORAL | Status: DC
  Administered 2016-03-23 – 2016-03-24 (×2): 5 mg via ORAL
  Filled 2016-03-22 (×2): qty 1

## 2016-03-22 MED ORDER — CLOPIDOGREL BISULFATE 75 MG PO TABS
75.0000 mg | ORAL_TABLET | Freq: Every day | ORAL | Status: DC
  Administered 2016-03-23: 75 mg via ORAL
  Filled 2016-03-22: qty 1

## 2016-03-22 MED ORDER — BRIMONIDINE TARTRATE 0.2 % OP SOLN
1.0000 [drp] | Freq: Every day | OPHTHALMIC | Status: DC
  Administered 2016-03-23 – 2016-03-26 (×4): 1 [drp] via OPHTHALMIC
  Filled 2016-03-22 (×2): qty 5

## 2016-03-22 NOTE — ED Provider Notes (Signed)
Emergency Department Provider Note   I have reviewed the triage vital signs and the nursing notes.   HISTORY  Chief Complaint Dizziness   HPI Bobby Krueger is a 82 y.o. male with PMH of a-fib on Eliquis, CHF, COPD, DM, HLD, and HTN presents to the emergency department for evaluation of chest discomfort and lightheadedness. Patient reports prior episodes of similar discomfort. He presented today because the lightheadedness and chest pain seemed slightly worse. He notes that symptoms are worse with exertion and relieved with rest. Pain is in the center of his chest with no radiation. No symptoms at rest. No prior history of coronary artery disease. Patient was recently evaluated and admitted for lightheadedness but to be due to hypoglycemia and possible beta blocker medication. Patient states he saw a cardiologist during that visit but has not seen him since. No associated fever, productive cough, shaking chills. No abdominal discomfort or dysuria.    Past Medical History:  Diagnosis Date  . A-fib (HCC)   . CHF (congestive heart failure) (HCC)   . Chronic combined systolic (congestive) and diastolic (congestive) heart failure (HCC)   . COPD (chronic obstructive pulmonary disease) (HCC)   . Diabetes mellitus without complication (HCC)   . High cholesterol   . Hypertension     Patient Active Problem List   Diagnosis Date Noted  . Cerebral thrombosis with cerebral infarction 03/23/2016  . Acute ischemic stroke (HCC) 03/23/2016  . Chest pain 03/22/2016  . Dizziness 03/22/2016  . Abdominal pain 03/22/2016  . Essential hypertension 03/22/2016  . Chronic combined systolic (congestive) and diastolic (congestive) heart failure (HCC)   . 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 Bradycardia 02/10/2016  . Atrial fibrillation (HCC) 11/19/2015  . CAD (coronary artery disease) 11/16/2015  . COPD (chronic obstructive pulmonary disease) (HCC)  11/16/2015  . GERD (gastroesophageal reflux disease) 11/16/2015    Past Surgical History:  Procedure Laterality Date  . BACK SURGERY    . CARPAL TUNNEL RELEASE    . CHOLECYSTECTOMY        Allergies Aldactone [spironolactone]; Aspirin; Ciprofloxacin; Cyclobenzaprine; and Lexapro [escitalopram]  Family History  Problem Relation Age of Onset  . Prostate cancer      Social History Social History  Substance Use Topics  . Smoking status: Former Games developer  . Smokeless tobacco: Never Used  . Alcohol use No    Review of Systems  Constitutional: No fever/chills Eyes: No visual changes. ENT: No sore throat. Cardiovascular: Positive chest pain and lightheadedness.  Respiratory: Denies shortness of breath. Gastrointestinal: No abdominal pain.  No nausea, no vomiting.  No diarrhea.  No constipation. Genitourinary: Negative for dysuria. Musculoskeletal: Negative for back pain. Skin: Negative for rash. Neurological: Negative for headaches, focal weakness or numbness.  10-point ROS otherwise negative.  ____________________________________________   PHYSICAL EXAM:  VITAL SIGNS: ED Triage Vitals [03/22/16 1806]  Enc Vitals Group     BP 139/91     Pulse Rate 74     Resp 19     Temp 97.8 F (36.6 C)     Temp Source Oral     SpO2 97 %   Constitutional: Alert and oriented. Well appearing and in no acute distress. Eyes: Conjunctivae are normal.  Head: Atraumatic. Nose: No congestion/rhinnorhea. Mouth/Throat: Mucous membranes are moist.  Oropharynx non-erythematous. Neck: No stridor.   Cardiovascular: Normal rate, a fib. Good peripheral circulation. Grossly normal heart sounds.   Respiratory: Normal respiratory effort.  No retractions. Lungs CTAB.  Gastrointestinal: Soft and nontender. No distention.  Musculoskeletal: No lower extremity tenderness nor edema. No gross deformities of extremities. Neurologic:  Normal speech and language. No gross focal neurologic deficits are  appreciated.  Skin:  Skin is warm, dry and intact. No rash noted. Psychiatric: Mood and affect are normal. Speech and behavior are normal.  ____________________________________________   LABS (all labs ordered are listed, but only abnormal results are displayed)  Labs Reviewed  COMPREHENSIVE METABOLIC PANEL - Abnormal; Notable for the following:       Result Value   Glucose, Bld 196 (*)    Total Protein 6.3 (*)    ALT 13 (*)    All other components within normal limits  URINALYSIS, ROUTINE W REFLEX MICROSCOPIC (NOT AT [facility]) - Abnormal; Notable for the following:    Glucose, UA 250 (*)    Hgb urine dipstick SMALL (*)    Protein, ur 30 (*)    All other components within normal limits  BRAIN NATRIURETIC PEPTIDE - Abnormal; Notable for the following:    B Natriuretic Peptide 482.9 (*)    All other components within normal limits  URINE MICROSCOPIC-ADD ON - Abnormal; Notable for the following:    Squamous Epithelial / LPF 0-5 (*)    Bacteria, UA FEW (*)    All other components within normal limits  TROPONIN I - Abnormal; Notable for the following:    Troponin I 0.03 (*)    All other components within normal limits  TROPONIN I - Abnormal; Notable for the following:    Troponin I 0.04 (*)    All other components within normal limits  TROPONIN I - Abnormal; Notable for the following:    Troponin I 0.03 (*)    All other components within normal limits  GLUCOSE, CAPILLARY - Abnormal; Notable for the following:    Glucose-Capillary 144 (*)    All other components within normal limits  LIPID PANEL - Abnormal; Notable for the following:    HDL 37 (*)    All other components within normal limits  GLUCOSE, CAPILLARY - Abnormal; Notable for the following:    Glucose-Capillary 143 (*)    All other components within normal limits  GLUCOSE, CAPILLARY - Abnormal; Notable for the following:    Glucose-Capillary 197 (*)    All other components within normal limits  CBC WITH  DIFFERENTIAL/PLATELET  LIPASE, BLOOD  URINE RAPID DRUG SCREEN, HOSP PERFORMED  PROTIME-INR  HEMOGLOBIN A1C  I-STAT TROPOININ, ED   ____________________________________________  EKG   EKG Interpretation  Date/Time:  Monday March 22 2016 18:05:40 EDT Ventricular Rate:  75 PR Interval:    QRS Duration: 244 QT Interval:  515 QTC Calculation: 564 R Axis:   -26 Text Interpretation:  Atrial fibrillation LVH with secondary repolarization abnormality Prolonged QT interval Baseline wander in lead(s) I aVL No STEMI.  Similar to prior tracing.  Confirmed by LONG MD, JOSHUA [id number]) on 03/22/2016 6:42:32 PM       ____________________________________________  RADIOLOGY  CXR reviewed  ____________________________________________   PROCEDURES  Procedure(s) performed:   Procedures  None ____________________________________________   INITIAL IMPRESSION / ASSESSMENT AND PLAN / ED COURSE  Pertinent labs & imaging results that were available during my care of the patient were reviewed by me and considered in my medical decision making (see chart for details).  Patient with multiple medical comorbidities presents to the emergency department for evaluation of chest discomfort. No associated dyspnea. No active chest pain right now. Plan for baseline labs and  chest x-ray. Will obtain BNP given the patient's history of congestive heart failure likely is not volume overloaded. Patient is currently on Eliquis for atrial fibrillation. EKG reviewed and documented in MUSE.   Labs resulted. Discussed admission with hospitalist and placed temporary orders. Patient with no additional CP. Updated him and gave lasix with elevated BNP. No hypoxemia or respiratory distress.  ____________________________________________  FINAL CLINICAL IMPRESSION(S) / ED DIAGNOSES  Final diagnoses:  Chest pain, unspecified chest pain type  Chronic combined systolic (congestive) and diastolic (congestive) heart  failure (HCC)  Abdominal pain  Dizziness  Stroke (cerebrum) (HCC)     MEDICATIONS GIVEN DURING THIS VISIT:  Medications  hydrochlorothiazide (HYDRODIURIL) tablet 12.5 mg (12.5 mg Oral Given 03/23/16 0940)  latanoprost (XALATAN) 0.005 % ophthalmic solution 1 drop (1 drop Both Eyes Given 03/22/16 2321)  pantoprazole (PROTONIX) EC tablet 40 mg (40 mg Oral Given 03/23/16 0940)  furosemide (LASIX) tablet 40 mg (40 mg Oral Given 03/23/16 0940)  carvedilol (COREG) tablet 6.25 mg (6.25 mg Oral Given 03/23/16 0759)  brimonidine (ALPHAGAN) 0.2 % ophthalmic solution 1 drop (1 drop Both Eyes Given 03/23/16 1007)  clopidogrel (PLAVIX) tablet 75 mg (75 mg Oral Given 03/23/16 0940)  mometasone-formoterol (DULERA) 200-5 MCG/ACT inhaler 2 puff (2 puffs Inhalation Given 03/23/16 1136)  gabapentin (NEURONTIN) capsule 300 mg (300 mg Oral Given 03/22/16 2320)  ipratropium-albuterol (DUONEB) 0.5-2.5 (3) MG/3ML nebulizer solution 3 mL (not administered)  polyvinyl alcohol (LIQUIFILM TEARS) 1.4 % ophthalmic solution 1 drop (not administered)  simvastatin (ZOCOR) tablet 40 mg (40 mg Oral Given 03/22/16 2320)  tiotropium (SPIRIVA) inhalation capsule 18 mcg (18 mcg Inhalation Not Given 03/23/16 1137)  terazosin (HYTRIN) capsule 5 mg (5 mg Oral Given 03/22/16 2321)  triamcinolone cream (KENALOG) 0.1 % 1 application (not administered)  hydrOXYzine (VISTARIL) injection 25 mg (not administered)  amLODipine (NORVASC) tablet 5 mg (5 mg Oral Given 03/23/16 0940)  insulin glargine (LANTUS) injection 7 Units (7 Units Subcutaneous Given 03/22/16 2320)  insulin aspart (novoLOG) injection 0-9 Units (2 Units Subcutaneous Given 03/23/16 1200)  nitroGLYCERIN (NITROSTAT) SL tablet 0.4 mg (not administered)  acetaminophen (TYLENOL) tablet 650 mg (not administered)  heparin injection 5,000 Units (5,000 Units Subcutaneous Given 03/23/16 0611)  dextromethorphan-guaiFENesin (MUCINEX DM) 30-600 MG per 12 hr tablet 1 tablet (1 tablet Oral Given 03/23/16 0940)    omega-3 acid ethyl esters (LOVAZA) capsule 1 g (1 g Oral Given 03/23/16 0940)  furosemide (LASIX) injection 40 mg (40 mg Intravenous Given 03/22/16 2047)   stroke: mapping our early stages of recovery book ( Does not apply Given 03/23/16 0130)  iopamidol (ISOVUE-300) 61 % injection (30 mLs  Contrast Given 03/23/16 0614)  iopamidol (ISOVUE-300) 61 % injection (100 mLs  Contrast Given 03/23/16 0915)     NEW OUTPATIENT MEDICATIONS STARTED DURING THIS VISIT:  None   Note:  This document was prepared using Dragon voice recognition software and may include unintentional dictation errors.  Alona Bene, MD Emergency Medicine   Maia Plan, MD 03/23/16 [phone]

## 2016-03-22 NOTE — ED Notes (Signed)
Attempted to call report to [address] x 1. 

## 2016-03-22 NOTE — H&P (Addendum)
History and Physical    Bobby Krueger:[id number] DOB: 29-May-1927 DOA: 03/22/2016  Referring MD/NP/PA:   PCP: [name]., MD   Patient coming from:  The patient is coming from home.  At baseline, pt is independent for most of ADL.      Chief Complaint: Chest pain, abdominal pain, dizziness and dry cough.  HPI: Bobby Krueger is a 82 y.o. male with medical history significant of hypertension, hyperlipidemia, diabetes mellitus, COPD, GERD, chronic combined systolic and diastolic CHF with EF 40-45 percent, atrial fibrillation (not compliant to Eliquis), who presents with chest pain, abdominal pain, dizziness and dry cough.  Patient reports that he started having chest pain in this morning. It is located and frontal chest, constant, 3 out of 10 in severity, pressure like pain, nonradiating. It is not aggravated or alleviated by any known factors. He has dry cough which is chronic issue, but no shortness of breath. No tenderness over calf areas..  Patient reports chronic and intermittent epigastric abdominal pain. Currently his abdominal pain is located in epigastric area, constant, sharp, 8 out of 10 in severity. Patient states that his abdominal pain has been going on for more than a year. It is not aggravated or alleviated by any known factors. He has nausea, but no vomiting or diarrhea.   He also reports intermittent dizziness which has been going on for years per patient. He states that he had one episode of dizziness in this morning, which lasted for about 1 hour, then resolved spontaneously. He does not have vision change, hearing loss. No feeling of room spinning. He denies unilateral weakness, numbness or tingling sensations in his extremities. He reports having chronic dry cough, currently no shortness with, fever or chills.  ED Course: pt was found to have negative troponin, BNP 4 8249, negative urinalysis, temperature normal, oxygen saturation 96% on room air, electrolytes and  renal function okay. Chest x-ray showed mild pulmonary vascular congestion. Patient is placed on telemetry bed for observation.   Review of Systems:   General: no fevers, chills, no changes in body weight, has fatigue HEENT: no blurry vision, hearing changes or sore throat Respiratory: no dyspnea, has dry coughing, no wheezing CV: has chest pain, no palpitations GI: has nausea, abdominal pain, no diarrhea, vomiting, constipation GU: no dysuria, burning on urination, increased urinary frequency, hematuria  Ext: no leg edema. No tenderness over calf areas. Neuro: no unilateral weakness, numbness, or tingling, no vision change or hearing loss. Has dizziness. Skin: no rash, no skin tear. MSK: No muscle spasm, no deformity, no limitation of range of movement in spin Heme: No easy bruising.  Travel history: No recent long distant travel.  Allergy:  Allergies  Allergen Reactions  . Aldactone [Spironolactone] Hives  . Aspirin Hives  . Ciprofloxacin Hives  . Cyclobenzaprine Hives  . Lexapro [Escitalopram] Hives    Past Medical History:  Diagnosis Date  . A-fib (HCC)   . CHF (congestive heart failure) (HCC)   . Chronic combined systolic (congestive) and diastolic (congestive) heart failure (HCC)   . COPD (chronic obstructive pulmonary disease) (HCC)   . Diabetes mellitus without complication (HCC)   . High cholesterol   . Hypertension     Past Surgical History:  Procedure Laterality Date  . BACK SURGERY    . CARPAL TUNNEL RELEASE    . CHOLECYSTECTOMY      Social History:  reports that he has quit smoking. He has never used smokeless tobacco. He reports that he does  not drink alcohol or use drugs.  Family History:  Family History  Problem Relation Age of Onset  . Prostate cancer       Prior to Admission medications   Medication Sig Start Date End Date Taking? Authorizing Provider  albuterol (PROAIR HFA) 108 (90 Base) MCG/ACT inhaler Inhale 2 puffs into the lungs every 4  (four) hours as needed for wheezing or shortness of breath.    Yes Historical Provider, MD  brimonidine (ALPHAGAN) 0.2 % ophthalmic solution Place 1 drop into both eyes daily.    Yes Historical Provider, MD  carvedilol (COREG) 6.25 MG tablet Take 6.25 mg by mouth 2 (two) times daily with a meal.    Yes Historical Provider, MD  clopidogrel (PLAVIX) 75 MG tablet Take 75 mg by mouth daily.   Yes Historical Provider, MD  Fluticasone-Salmeterol (ADVAIR DISKUS) 250-50 MCG/DOSE AEPB Inhale 1 puff into the lungs daily as needed (for wheezing or shortness).    Yes Historical Provider, MD  furosemide (LASIX) 40 MG tablet Take 1 tablet (40 mg total) by mouth daily. 02/23/16  Yes [name], NP  gabapentin (NEURONTIN) 300 MG capsule Take 300 mg by mouth at bedtime.   Yes Historical Provider, MD  hydrochlorothiazide (HYDRODIURIL) 25 MG tablet Take 12.5 mg by mouth daily.   Yes Historical Provider, MD  ibuprofen (ADVIL,MOTRIN) 400 MG tablet Take 400 mg by mouth every 6 (six) hours as needed for moderate pain.   Yes Historical Provider, MD  insulin glargine (LANTUS) 100 UNIT/ML injection Inject 0.08 mLs (8 Units total) into the skin at bedtime. Patient taking differently: Inject 10 Units into the skin at bedtime.  02/14/16  Yes Wyatt Hasteavid K Hower, MD  insulin lispro (HUMALOG) 100 UNIT/ML injection Inject 0-9 Units into the skin 3 (three) times daily before meals. CBG's 70-120 = 0 units; 121-150 =1 units; 151-200 = 2 units; 201-250 =3 units; 251-300 =5 units; 301-350 =7 units; 351-400=9 units and > 400 Notify provider.   Yes Historical Provider, MD  ipratropium-albuterol (DUONEB) 0.5-2.5 (3) MG/3ML SOLN Take 3 mLs by nebulization every 6 (six) hours as needed (for SOB).   Yes Historical Provider, MD  latanoprost (XALATAN) 0.005 % ophthalmic solution Place 1 drop into both eyes at bedtime.   Yes Historical Provider, MD  losartan (COZAAR) 100 MG tablet Take 100 mg by mouth daily. 12/31/15  Yes Historical Provider, MD    nateglinide (STARLIX) 120 MG tablet Take 120 mg by mouth daily. 10/02/15  Yes Historical Provider, MD  Omega-3 1000 MG CAPS Take 1 g by mouth daily.   Yes Historical Provider, MD  omeprazole (PRILOSEC) 20 MG capsule Take 20 mg by mouth daily.   Yes Historical Provider, MD  polyvinyl alcohol (LIQUIFILM TEARS) 1.4 % ophthalmic solution Place 1 drop into both eyes as needed for dry eyes. Reported on 12/21/2015   Yes Historical Provider, MD  potassium chloride SA (K-DUR,KLOR-CON) 20 MEQ tablet Take 20 mEq by mouth daily.   Yes Historical Provider, MD  promethazine (PHENERGAN) 25 MG tablet Take 12.5-25 mg by mouth every 4 (four) hours as needed.   Yes Historical Provider, MD  simvastatin (ZOCOR) 40 MG tablet Take 40 mg by mouth every evening.   Yes Historical Provider, MD  SPIRIVA HANDIHALER 18 MCG inhalation capsule Place 1 application into inhaler and inhale daily. 10/13/15  Yes Historical Provider, MD  terazosin (HYTRIN) 5 MG capsule Take 5 mg by mouth every evening.   Yes Historical Provider, MD  triamcinolone cream (KENALOG) 0.1 %  Apply 1 application topically 2 (two) times daily as needed (for irritation).   Yes Historical Provider, MD  zolpidem (AMBIEN) 10 MG tablet Take 10 mg by mouth at bedtime as needed for sleep.    Yes Historical Provider, MD  apixaban (ELIQUIS) 5 MG TABS tablet Take 1 tablet (5 mg total) by mouth 2 (two) times daily. Patient not taking: Reported on 03/22/2016 11/21/15   [name], MD  ketorolac (ACULAR) 0.5 % ophthalmic solution Place 1 drop into the right eye 4 (four) times daily. Patient not taking: Reported on 03/22/2016 02/02/16   [name], PA-C  predniSONE (STERAPRED UNI-PAK 21 TAB) 10 MG (21) TBPK tablet Take 1 tablet (10 mg total) by mouth daily. 40mg  x1 day, 20mg  x2 day, 10mg  x2 day, then stop Patient not taking: Reported on 03/22/2016 02/14/16   [name], MD  trimethoprim-polymyxin b (POLYTRIM) ophthalmic solution Place 2 drops into the right eye every 6  (six) hours. Patient not taking: Reported on 03/22/2016 02/02/16   [name], PA-C    Physical Exam: Vitals:   03/22/16 2030 03/22/16 2045 03/22/16 2145 03/22/16 2200  BP: 156/95 177/98 (!) 142/101 138/89  Pulse: 86 85 86 85  Resp:  19 15 17   Temp:      TempSrc:      SpO2: 96% 97% 98% 95%   General: Not in acute distress HEENT:       Eyes: PERRL, EOMI, no scleral icterus.       ENT: No discharge from the ears and nose, no pharynx injection, no tonsillar enlargement.        Neck: No JVD, no bruit, no mass felt. Heme: No neck lymph node enlargement. Cardiac: S1/S2, RRR, No murmurs, No gallops or rubs. Respiratory: No rales, wheezing, rhonchi or rubs. GI: Soft, nondistended, has tenderness over epigastric area, no rebound pain, no organomegaly, BS present. GU: No hematuria Ext: No pitting leg edema bilaterally. 2+DP/PT pulse bilaterally. Musculoskeletal: No joint deformities, No joint redness or warmth, no limitation of ROM in spin. Skin: No rashes.  Neuro: Alert, oriented X3, cranial nerves II-XII grossly intact, moves all extremities normally. Muscle strength 5/5 in all extremities, sensation to light touch intact. Brachial reflex 2+ bilaterally.  Negative Babinski's sign. Normal finger to nose test. Psych: Patient is not psychotic, no suicidal or hemocidal ideation.  Labs on Admission: I have personally reviewed following labs and imaging studies  CBC:  Recent Labs Lab 03/22/16 1840  WBC 5.9  NEUTROABS 3.5  HGB 13.5  HCT 40.8  MCV 91.1  PLT 173   Basic Metabolic Panel:  Recent Labs Lab 03/22/16 1840  NA 136  K 3.6  CL 106  CO2 24  GLUCOSE 196*  BUN 8  CREATININE 0.97  CALCIUM 9.3   GFR: CrCl cannot be calculated (Unknown ideal weight.). Liver Function Tests:  Recent Labs Lab 03/22/16 1840  AST 16  ALT 13*  ALKPHOS 77  BILITOT 0.7  PROT 6.3*  ALBUMIN 3.5    Recent Labs Lab 03/22/16 2138  LIPASE 16   No results for input(s): AMMONIA  in the last 168 hours. Coagulation Profile:  Recent Labs Lab 03/22/16 2138  INR 1.09   Cardiac Enzymes:  Recent Labs Lab 03/22/16 2138  TROPONINI 0.03*   BNP (last 3 results) No results for input(s): PROBNP in the last 8760 hours. HbA1C: No results for input(s): HGBA1C in the last 72 hours. CBG: No results for input(s): GLUCAP in the last 168 hours.  Lipid Profile: No results for input(s): CHOL, HDL, LDLCALC, TRIG, CHOLHDL, LDLDIRECT in the last 72 hours. Thyroid Function Tests: No results for input(s): TSH, T4TOTAL, FREET4, T3FREE, THYROIDAB in the last 72 hours. Anemia Panel: No results for input(s): VITAMINB12, FOLATE, FERRITIN, TIBC, IRON, RETICCTPCT in the last 72 hours. Urine analysis:    Component Value Date/Time   COLORURINE YELLOW 03/22/2016 1825   APPEARANCEUR CLEAR 03/22/2016 1825   APPEARANCEUR Clear 06/05/2013 1502   LABSPEC 1.015 03/22/2016 1825   LABSPEC 1.012 06/05/2013 1502   PHURINE 6.0 03/22/2016 1825   GLUCOSEU 250 (A) 03/22/2016 1825   GLUCOSEU 50 mg/dL 62/13/0865 7846   HGBUR SMALL (A) 03/22/2016 1825   BILIRUBINUR NEGATIVE 03/22/2016 1825   BILIRUBINUR Negative 06/05/2013 1502   KETONESUR NEGATIVE 03/22/2016 1825   PROTEINUR 30 (A) 03/22/2016 1825   NITRITE NEGATIVE 03/22/2016 1825   LEUKOCYTESUR NEGATIVE 03/22/2016 1825   LEUKOCYTESUR Negative 06/05/2013 1502   Sepsis Labs: @LABRCNTIP (procalcitonin:4,lacticidven:4) )No results found for this or any previous visit (from the past 240 hour(s)).   Radiological Exams on Admission: Dg Chest 2 View  Result Date: 03/22/2016 CLINICAL DATA:  81 year old male with chest pain. Initial encounter. EXAM: CHEST  2 VIEW COMPARISON:  02/09/2016 and earlier. FINDINGS: Calcified aortic atherosclerosis. Stable cardiomegaly and mediastinal contours. Stable visible cervical ACDF hardware. Stable cholecystectomy clips. Stable somewhat low lung volumes. Mildly increased pulmonary vascularity but no overt edema. No  pneumothorax, pleural effusion or confluent pulmonary opacity. Small chronic calcified granuloma at the left lung base. No acute osseous abnormality identified. Negative visible bowel gas pattern. IMPRESSION: Stable cardiomegaly and calcified aortic atherosclerosis. Mildly increased pulmonary vascularity but no overt edema or other acute cardiopulmonary abnormality. Electronically Signed   By: Odessa Fleming M.D.   On: 03/22/2016 19:15     EKG: Independently reviewed.  QTC 564, atrial fibrillation, poor R-wave progression, LAD, old left bundle blockage which existed in previous EKG on 02/07/16, anteroseptal infarction pattern  Assessment/Plan Principal Problem:   Chest pain Active Problems:   CAD (coronary artery disease)   COPD (chronic obstructive pulmonary disease) (HCC)   GERD (gastroesophageal reflux disease)   Atrial fibrillation (HCC)   DM (diabetes mellitus), type 2, uncontrolled (HCC)   Dizziness   Abdominal pain   Chronic combined systolic (congestive) and diastolic (congestive) heart failure (HCC)   Essential hypertension   Chest pain and hx of CAD: Etiology is not clear. No infiltration on chest x-ray. No signs of DVT, less likely to have PE. Patient has significant risk factors, including hypertension, hyperlipidemia, diabetes mellitus and old age, will do chest pain r/o work up.  - will place on Tele bed for obs - cycle CE q6 x3 and repeat her EKG in the am  - Nitroglycerin, Morphine, and plavix (pt is allergic to ASA), Zocor - Risk factor stratification: will check FLP, UDS and A1C  - 2d echo - please call Card in AM  Epigastric abdominal pain: This is chronic issue. Etiology is not clear. Patient is taking ibuprofen, which may have partially contributed. Other differential diagnosis includes pancreatitis and gastric ulcer or gastritis. -Continue PPI, Protonix -Hold ibuprofen -Check lipase -CT abdomen/pelvis  Dizziness: Etiology is not clear. Patient states that it has been  going intermittently on for years. Potential differential diagnosis include orthostatic status, peripheral vertigo versus TIA/stroke. He had another episode of dizziness in this morning, which has resolved. Currently patient does not have dizziness. Pt has hx of atrial fibrillation, but not compliant to anticoagulants, he has high  risk of developing TIA/stroke. -will get MRI-brain to r/o stroke. -pt/ot -ortho static vital signs  Addendum: MRI of brain showed 4 mm acute ischemic infarct within the anterior right centrum semi ovale, most likely due to acute small vessel disease. -consulted neurology, Dr. Roseanne Reno will see pt, will follow up recommendations -Atrial fibrillation: not present  -tPA was not given because symptoms has resolved. - Risk factor modification: HgbA1c, fasting lipid panel as above - will get MRA of the brain without contrast  - PT consult, OT consult - Bedside swallowing screen was ordered, will get speech consult in not passing swallowing study - 2 d Echocardiogram  - Carotid dopplers  - on plavix  COPD: stable. Patient has dry cough, but no wheezing or rhonchi on auscultation.  -Dulera inhaler -prn DuoNebsx -spiriva inhaler  GERD: -Protonix  HTN: -hold Cozzar due to dry cough -start Amlodipine 5 mg daily -Continue Coreg, HCTZ -Also on Tetrazosin and lasix   DM-II: Last A1c 7.4, not ideally controled. Patient is taking Nateglinde, Humalog and Lantus at home -will decrease Lantus dose from 10 units to 7 units daily -SSI  Atrial Fibrillation: CHA2DS2-VASc Score is 5, needs oral anticoagulation. Patient is supposed to take Eliquis at home, but has not been compliant. Currently he is not taking Eliquis. Heart rate is well controlled. -will hold Eliquis now due to ongoing abdominal pain (in case he needs any procedure) -tele monitoring -continue coreg  Chronic combined systolic (congestive) and diastolic (congestive) heart failure (HCC): 2-D echo on 11/18/15  showed EF of 40-45 percent with grade 1 diastolic dysfunction. Patient is on Lasix 40 mg daily and HCTZ 12.5 mg daily.  BNP is slightly elevated 482.9, but patient does not have leg edema or JVD. Clinically patient does not seem to have CHF exacerbation. Pt was given one dose of lasix 40 mg by IV in ED  -Continue home oral Lasix and HCTZ -Continue Coreg   DVT ppx: SQ Heparin (if pt develops severe chest pain or significantly elevated trop, will be easier to switch to IV heparin or stop heparin for procedure than using Lovenox).  Code Status: Full code Family Communication: None at bed side.  Disposition Plan:  Anticipate discharge back to previous home environment Consults called: none  Admission status: Obs / tele  Date of Service 03/22/2016    Lorretta Harp [facility] Pager [phone]  If 7PM-7AM, please contact night-coverage [email or web address] Password [email or web address] 03/22/2016, 10:15 PM

## 2016-03-22 NOTE — ED Notes (Signed)
Patient transported to x-ray. ?

## 2016-03-22 NOTE — ED Notes (Addendum)
Dr. Niu at bedside at this time.  

## 2016-03-22 NOTE — ED Notes (Signed)
Patient requesting to know what will happen next. MD states he will see patient shortly to update him on plan of care. Patient aware.

## 2016-03-22 NOTE — ED Triage Notes (Signed)
Per [facility] called out due to dizziness.  Patient states he has been dizzy for approximately one month but states that the dizziness is worse today.  Patient denies falling.  Denies LOC, nausea, pain.  Patient alert and oriented at this time.

## 2016-03-23 ENCOUNTER — Observation Stay (HOSPITAL_COMMUNITY): Payer: Medicare Other

## 2016-03-23 ENCOUNTER — Inpatient Hospital Stay (HOSPITAL_COMMUNITY): Payer: Medicare Other

## 2016-03-23 ENCOUNTER — Encounter (HOSPITAL_COMMUNITY): Payer: Self-pay | Admitting: Radiology

## 2016-03-23 DIAGNOSIS — N400 Enlarged prostate without lower urinary tract symptoms: Secondary | ICD-10-CM | POA: Diagnosis present

## 2016-03-23 DIAGNOSIS — R42 Dizziness and giddiness: Secondary | ICD-10-CM | POA: Diagnosis present

## 2016-03-23 DIAGNOSIS — I5042 Chronic combined systolic (congestive) and diastolic (congestive) heart failure: Secondary | ICD-10-CM | POA: Diagnosis present

## 2016-03-23 DIAGNOSIS — R079 Chest pain, unspecified: Secondary | ICD-10-CM

## 2016-03-23 DIAGNOSIS — I25118 Atherosclerotic heart disease of native coronary artery with other forms of angina pectoris: Secondary | ICD-10-CM | POA: Diagnosis present

## 2016-03-23 DIAGNOSIS — I110 Hypertensive heart disease with heart failure: Secondary | ICD-10-CM | POA: Diagnosis present

## 2016-03-23 DIAGNOSIS — I255 Ischemic cardiomyopathy: Secondary | ICD-10-CM | POA: Diagnosis present

## 2016-03-23 DIAGNOSIS — T502X5A Adverse effect of carbonic-anhydrase inhibitors, benzothiadiazides and other diuretics, initial encounter: Secondary | ICD-10-CM | POA: Diagnosis not present

## 2016-03-23 DIAGNOSIS — R29700 NIHSS score 0: Secondary | ICD-10-CM | POA: Diagnosis present

## 2016-03-23 DIAGNOSIS — K219 Gastro-esophageal reflux disease without esophagitis: Secondary | ICD-10-CM | POA: Diagnosis present

## 2016-03-23 DIAGNOSIS — R1013 Epigastric pain: Secondary | ICD-10-CM | POA: Diagnosis not present

## 2016-03-23 DIAGNOSIS — R072 Precordial pain: Secondary | ICD-10-CM | POA: Diagnosis not present

## 2016-03-23 DIAGNOSIS — I639 Cerebral infarction, unspecified: Secondary | ICD-10-CM | POA: Diagnosis not present

## 2016-03-23 DIAGNOSIS — I6789 Other cerebrovascular disease: Secondary | ICD-10-CM | POA: Diagnosis not present

## 2016-03-23 DIAGNOSIS — E669 Obesity, unspecified: Secondary | ICD-10-CM | POA: Diagnosis present

## 2016-03-23 DIAGNOSIS — I480 Paroxysmal atrial fibrillation: Secondary | ICD-10-CM | POA: Diagnosis present

## 2016-03-23 DIAGNOSIS — I700 Atherosclerosis of aorta: Secondary | ICD-10-CM | POA: Diagnosis present

## 2016-03-23 DIAGNOSIS — I481 Persistent atrial fibrillation: Secondary | ICD-10-CM | POA: Diagnosis not present

## 2016-03-23 DIAGNOSIS — E1165 Type 2 diabetes mellitus with hyperglycemia: Secondary | ICD-10-CM | POA: Diagnosis present

## 2016-03-23 DIAGNOSIS — Z794 Long term (current) use of insulin: Secondary | ICD-10-CM

## 2016-03-23 DIAGNOSIS — E1142 Type 2 diabetes mellitus with diabetic polyneuropathy: Secondary | ICD-10-CM

## 2016-03-23 DIAGNOSIS — E876 Hypokalemia: Secondary | ICD-10-CM | POA: Diagnosis not present

## 2016-03-23 DIAGNOSIS — I10 Essential (primary) hypertension: Secondary | ICD-10-CM | POA: Diagnosis not present

## 2016-03-23 DIAGNOSIS — I482 Chronic atrial fibrillation: Secondary | ICD-10-CM | POA: Diagnosis present

## 2016-03-23 DIAGNOSIS — I447 Left bundle-branch block, unspecified: Secondary | ICD-10-CM | POA: Diagnosis present

## 2016-03-23 DIAGNOSIS — I4581 Long QT syndrome: Secondary | ICD-10-CM | POA: Diagnosis present

## 2016-03-23 DIAGNOSIS — I25119 Atherosclerotic heart disease of native coronary artery with unspecified angina pectoris: Secondary | ICD-10-CM | POA: Diagnosis not present

## 2016-03-23 DIAGNOSIS — J449 Chronic obstructive pulmonary disease, unspecified: Secondary | ICD-10-CM

## 2016-03-23 DIAGNOSIS — E7800 Pure hypercholesterolemia, unspecified: Secondary | ICD-10-CM | POA: Diagnosis present

## 2016-03-23 DIAGNOSIS — I952 Hypotension due to drugs: Secondary | ICD-10-CM | POA: Diagnosis not present

## 2016-03-23 DIAGNOSIS — I638 Other cerebral infarction: Secondary | ICD-10-CM | POA: Diagnosis present

## 2016-03-23 DIAGNOSIS — I2510 Atherosclerotic heart disease of native coronary artery without angina pectoris: Secondary | ICD-10-CM

## 2016-03-23 DIAGNOSIS — Z6832 Body mass index (BMI) 32.0-32.9, adult: Secondary | ICD-10-CM | POA: Diagnosis not present

## 2016-03-23 DIAGNOSIS — Y92230 Patient room in hospital as the place of occurrence of the external cause: Secondary | ICD-10-CM | POA: Diagnosis not present

## 2016-03-23 DIAGNOSIS — I708 Atherosclerosis of other arteries: Secondary | ICD-10-CM | POA: Diagnosis present

## 2016-03-23 DIAGNOSIS — E1149 Type 2 diabetes mellitus with other diabetic neurological complication: Secondary | ICD-10-CM | POA: Diagnosis present

## 2016-03-23 DIAGNOSIS — I6330 Cerebral infarction due to thrombosis of unspecified cerebral artery: Secondary | ICD-10-CM | POA: Insufficient documentation

## 2016-03-23 LAB — LIPID PANEL
CHOL/HDL RATIO: 3 ratio
Cholesterol: 111 mg/dL (ref 0–200)
HDL: 37 mg/dL — ABNORMAL LOW (ref >40)
LDL Cholesterol: 52 mg/dL (ref 0–99)
Triglycerides: 108 mg/dL (ref <150)
VLDL: 22 mg/dL (ref 0–40)

## 2016-03-23 LAB — GLUCOSE, CAPILLARY
GLUCOSE-CAPILLARY: 167 mg/dL — AB (ref 65–99)
Glucose-Capillary: 143 mg/dL — ABNORMAL HIGH (ref 65–99)
Glucose-Capillary: 197 mg/dL — ABNORMAL HIGH (ref 65–99)
Glucose-Capillary: 202 mg/dL — ABNORMAL HIGH (ref 65–99)

## 2016-03-23 LAB — TROPONIN I
TROPONIN I: 0.03 ng/mL — AB (ref <0.03)
Troponin I: 0.04 ng/mL (ref <0.03)

## 2016-03-23 MED ORDER — IOPAMIDOL (ISOVUE-300) INJECTION 61%
INTRAVENOUS | Status: AC
  Administered 2016-03-23: 30 mL
  Filled 2016-03-23: qty 30

## 2016-03-23 MED ORDER — IOPAMIDOL (ISOVUE-300) INJECTION 61%
INTRAVENOUS | Status: AC
  Administered 2016-03-23: 100 mL
  Filled 2016-03-23: qty 100

## 2016-03-23 MED ORDER — STROKE: EARLY STAGES OF RECOVERY BOOK
Freq: Once | Status: AC
  Administered 2016-03-23: 02:00:00
  Filled 2016-03-23: qty 1

## 2016-03-23 MED ORDER — ONDANSETRON HCL 4 MG/2ML IJ SOLN
4.0000 mg | Freq: Four times a day (QID) | INTRAMUSCULAR | Status: DC | PRN
  Filled 2016-03-23: qty 2

## 2016-03-23 MED ORDER — ATORVASTATIN CALCIUM 10 MG PO TABS
20.0000 mg | ORAL_TABLET | Freq: Every day | ORAL | Status: DC
  Administered 2016-03-23 – 2016-03-26 (×4): 20 mg via ORAL
  Filled 2016-03-23 (×4): qty 2

## 2016-03-23 NOTE — Progress Notes (Addendum)
Pt admitted to 33M room 15. A&O x4. Vital signs stable and pt is in no signs of distress. Telemetry box placed and central monitoring notified. Bed alarm on. will continue to monitor. Royston Cowper, RN

## 2016-03-23 NOTE — Evaluation (Signed)
Occupational Therapy Evaluation Patient Details Name: Bobby Krueger MRN: [id number] DOB: 10-31-1926 Today's Date: 03/23/2016    History of Present Illness 82 yo male admitted with dizziness and MRI(+_) R centrum semiovale infarct. PMH:  Past Medical History:  Diagnosis Date  . A-fib (HCC)   . 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 03/2014: EF 35%, normal nuc in 05/2014 b. echo 11/2015: EF 40-45% w/ diffuse HK and Grade 1 DD  . COPD (chronic obstructive pulmonary disease) (HCC)   . Diabetes mellitus without complication (HCC)   . High cholesterol   . Hypertension       Clinical Impression   PT admitted with acute infarct . Pt currently with functional limitiations due to the deficits listed below (see OT problem list). Pt with multiple admissions recently with last admission to [facility] with SNF placement. Pt currently with balance deficits and high risk for falling.  Pt will benefit from skilled OT to increase their independence and safety with adls and balance to allow discharge SNF.  SEE VITALS for elevated BP with sit<>Stand      Follow Up Recommendations  SNF;Supervision/Assistance - 24 hour    Equipment Recommendations  3 in 1 bedside comode;Other (comment) (defer to SNF)    Recommendations for Other Services       Precautions / Restrictions Precautions Precautions: Fall      Mobility Bed Mobility Overal bed mobility: Needs Assistance Bed Mobility: Supine to Sit           General bed mobility comments: incr time   Transfers Overall transfer level: Needs assistance Equipment used: Straight cane Transfers: Sit to/from Stand Sit to Stand: Min assist         General transfer comment: with LOB upon standing    Balance Overall balance assessment: Needs assistance Sitting-balance support: Bilateral upper extremity supported;Feet supported Sitting balance-Leahy Scale: Good     Standing balance  support: Single extremity supported;During functional activity Standing balance-Leahy Scale: Poor                              ADL Overall ADL's : Needs assistance/impaired     Grooming: Wash/dry hands;Min guard;Sitting   Upper Body Bathing: Moderate assistance           Lower Body Dressing: Total assistance Lower Body Dressing Details (indicate cue type and reason): doff socks for new socks Toilet Transfer: Moderate assistance   Toileting- Clothing Manipulation and Hygiene: Moderate assistance Toileting - Clothing Manipulation Details (indicate cue type and reason): pt static standing to void with urinal and spilling on floor.        General ADL Comments: Pt with dizziness supine<>sit and sit<>Stand with elevated BP. Pt with symptoms consistent with elevated BP. pt with posterior lean immediately upon standing and bil LE braced against bed surface. pt with one step forward per OT request and immediate posterior lean and bil ankles attempting to use compensatory strategies      Vision     Perception     Praxis      Pertinent Vitals/Pain Pain Assessment: No/denies pain     Hand Dominance Right   Extremity/Trunk Assessment Upper Extremity Assessment Upper Extremity Assessment: Overall WFL for tasks assessed;LUE deficits/detail LUE Deficits / Details: reports soreness 4th digit at MCP. bruise noted and states "it did go all the way around. I dont know how i did it"   Lower Extremity  Assessment Lower Extremity Assessment: Defer to PT evaluation   Cervical / Trunk Assessment Cervical / Trunk Assessment: Kyphotic   Communication Communication Communication: HOH (very HOH)   Cognition Arousal/Alertness: Awake/alert Behavior During Therapy: WFL for tasks assessed/performed                       General Comments       Exercises       Shoulder Instructions      Home Living Family/patient expects to be discharged to:: Private  residence Living Arrangements: Alone Available Help at Discharge: Family;Available PRN/intermittently (son ) Type of Home: House Home Access: Stairs to enter Entergy CorporationEntrance Stairs-Number of Steps: 3 Entrance Stairs-Rails: Right Home Layout: One level     Bathroom Shower/Tub: Producer, television/film/videoWalk-in shower   Bathroom Toilet: Standard     Home Equipment: Cane - single point;Walker - 2 wheels;Walker - 4 wheels;Shower seat - built in;Grab bars - tub/shower   Additional Comments: Son lives close by and provides daily check in. reports driving to grocery store alone.       Prior Functioning/Environment Level of Independence: Independent with assistive device(s)        Comments: Mod indep with use of SPC vs. RW (primarily SPC).  Denies fall history within previous six months.    OT Diagnosis: Generalized weakness;Cognitive deficits   OT Problem List: Decreased strength;Decreased activity tolerance;Impaired balance (sitting and/or standing);Decreased cognition;Decreased safety awareness;Decreased knowledge of use of DME or AE;Decreased knowledge of precautions;Cardiopulmonary status limiting activity   OT Treatment/Interventions: Self-care/ADL training;Therapeutic exercise;DME and/or AE instruction;Therapeutic activities;Cognitive remediation/compensation;Patient/family education;Balance training    OT Goals(Current goals can be found in the care plan section) Acute Rehab OT Goals Patient Stated Goal: to return home to 2 dogs ( patient forgot about dogs until OT asked a second time and states "Oh yeah there is two of them." pt reports being the main caregiver for the animals OT Goal Formulation: With patient Time For Goal Achievement: 04/06/16 Potential to Achieve Goals: Good ADL Goals Pt Will Perform Grooming: with min guard assist;standing Pt Will Perform Upper Body Bathing: with min guard assist;standing Pt Will Perform Lower Body Bathing: with min guard assist;sit to/from stand Pt Will Transfer to  Toilet: with min guard assist;ambulating;bedside commode  OT Frequency: Min 2X/week   Barriers to D/C: Decreased caregiver support  son lives next door but patient is home alone.       Co-evaluation              End of Session Equipment Utilized During Treatment: Gait belt;Oxygen Nurse Communication: Mobility status;Precautions  Activity Tolerance: Patient tolerated treatment well Patient left: in bed;with call bell/phone within reach;with nursing/sitter in room (RN in room to do rounding. )   Time: [id number] OT Time Calculation (min): 19 min Charges:  OT General Charges $OT Visit: 1 Procedure OT Evaluation $OT Eval Moderate Complexity: 1 Procedure G-Codes:    Harolyn RutherfordJones, Keaira Whitehurst B 03/23/2016, 3:30 PM   Mateo FlowJones, Brynn   OTR/L Pager: [phone] Office: [phone] .

## 2016-03-23 NOTE — Care Management Note (Signed)
Case Management Note  Patient Details  Name: Bobby Krueger MRN: [id number] Date of Birth: 22-Apr-1927  Subjective/Objective:   Pt admitted with chest pain. Found to have positive MRI.                  Action/Plan: Awaiting PT/OT recommendations. CM following for discharge disposition.   Expected Discharge Date:                  Expected Discharge Plan:     In-House Referral:     Discharge planning Services     Post Acute Care Choice:    Choice offered to:     DME Arranged:    DME Agency:     HH Arranged:    HH Agency:     Status of Service:  In process, will continue to follow  If discussed at Long Length of Stay Meetings, dates discussed:    Additional Comments:  Kermit Balo, RN 03/23/2016, 2:40 PM

## 2016-03-23 NOTE — Progress Notes (Signed)
Pt transferred to rm 5M15 via bed in stable condition.

## 2016-03-23 NOTE — Progress Notes (Addendum)
PROGRESS NOTE    Bobby Krueger  QVZ:[id number] DOB: 1927-02-28 DOA: 03/22/2016 PCP: [name]., MD      Brief Narrative:  Bobby Krueger is a 81 y.o. male with medical history significant of hypertension, hyperlipidemia, diabetes mellitus, COPD, GERD, chronic combined systolic and diastolic CHF with EF 40-45 percent, atrial fibrillation (not compliant to Eliquis), who presents with chest pain, abdominal pain, dizziness and dry cough. He complained for frontal, constant chest pain, 3/10 in severity, pressure-like, and non-radiating.   Assessment & Plan:   Principal Problem:   Chest pain Active Problems:   CAD (coronary artery disease)   COPD (chronic obstructive pulmonary disease) (HCC)   GERD (gastroesophageal reflux disease)   Atrial fibrillation (HCC)   DM (diabetes mellitus), type 2, uncontrolled (HCC)   Dizziness   Abdominal pain   Chronic combined systolic (congestive) and diastolic (congestive) heart failure (HCC)   Essential hypertension   Stroke (HCC)  Acute right centrum semiovale ischemic infarct  tPA held due to improvement in symptoms  Neurology following   MRA brain - patient refused   PT/OT   Echo pending   Carotid dopplers pending   Plavix - will need to switch to anticoagulant due to A Fib CHADSVASc 7   Neuro checks    Chest pain with hx of CAD  Nitro prn for CP   Troponin x3   Ha1c 7.4  Zocor   Plavix   Cardiology paged for consult  Chronic A Fib  CHADSVASc 7   Non-compliant with Eliquis   Coreg   Tele   Cardiology paged for consult  Chronic combined systolic and diastolic heart failure   Previous Echo 11/2015: EF 40-45%, grade I diastolic heart dysfunction   Echo pending   Lasix   HCTZ   Coreg  Cozaar held due to cough   Epigastric abdominal pain   CT abd/pelvis negative   Lipase normal    PPI  Essential HTN  Hold cozaar due to cough  Amlodipine  Coreg  HCTZ  Lasix  DM type 2, insulin dependent, with neuropathy  Ha1c  pending   Lantus 7u qhs   SSI   Neurontin   COPD, well controlled   Dulera  Spiriva  Duonebs prn  BPH  Terazosin   GERD  PPI  DVT prophylaxis: subq hep  Code Status: Full Family Communication: no family in room during exam Disposition Plan: further tests pending. Discharge timing pending tests, consulting services    Consultants:   Neurology/Stroke   Cardiology   Procedures:  None  Antimicrobials:   None    Subjective: Patient doing well on my exam today. He had just finished eating breakfast without event. He tells me that he had anterior bilateral chest pain that was tightness in nature, associated with shortness of breath yesterday. He also admits to chronic dizziness. He also had some epigastric abdominal pain without nausea, vomiting, diarrhea. All of the symptoms have since resolved. He states that he does not recall taking Eliquis in the past.    Objective: Vitals:   03/23/16 0225 03/23/16 0430 03/23/16 0615 03/23/16 0800  BP: (!) 146/81 (!) 155/82 (!) 150/85 (!) 161/90  Pulse: 92 89 89 83  Resp: 18 18 18 18   Temp: 98 F (36.7 C) 98 F (36.7 C) 98.1 F (36.7 C) 98.1 F (36.7 C)  TempSrc: Oral Oral Oral Oral  SpO2: 94% 96% 94% 95%  Weight: 90.9 kg (200 lb 8 oz)     Height: 5\' 6"  (  1.676 m)       Intake/Output Summary (Last 24 hours) at 03/23/16 1021 Last data filed at 03/23/16 0658  Gross per 24 hour  Intake                0 ml  Output              950 ml  Net             -950 ml   Filed Weights   03/22/16 2232 03/23/16 0225  Weight: 88.8 kg (195 lb 11.2 oz) 90.9 kg (200 lb 8 oz)    Examination:  General exam: Appears calm and comfortable  Respiratory system: Clear to auscultation. Respiratory effort normal. Cardiovascular system: S1 & S2 heard, irregular rhythm. No JVD, murmurs, rubs, gallops or clicks. No pedal edema. Gastrointestinal system: Abdomen is nondistended, +TTP epigastric . No organomegaly or masses felt. Normal bowel sounds  heard. Central nervous system: Alert and oriented. No focal neurological deficits. Extremities: Symmetric 5 x 5 power. Skin: No rashes, lesions or ulcers Psychiatry: Judgement and insight appear normal. Mood & affect appropriate.   Data Reviewed: I have personally reviewed following labs and imaging studies  CBC:  Recent Labs Lab 03/22/16 1840  WBC 5.9  NEUTROABS 3.5  HGB 13.5  HCT 40.8  MCV 91.1  PLT 173   Basic Metabolic Panel:  Recent Labs Lab 03/22/16 1840  NA 136  K 3.6  CL 106  CO2 24  GLUCOSE 196*  BUN 8  CREATININE 0.97  CALCIUM 9.3   GFR: Estimated Creatinine Clearance: 56.6 mL/min (by C-G formula based on SCr of 0.97 mg/dL). Liver Function Tests:  Recent Labs Lab 03/22/16 1840  AST 16  ALT 13*  ALKPHOS 77  BILITOT 0.7  PROT 6.3*  ALBUMIN 3.5    Recent Labs Lab 03/22/16 2138  LIPASE 16   No results for input(s): AMMONIA in the last 168 hours. Coagulation Profile:  Recent Labs Lab 03/22/16 2138  INR 1.09   Cardiac Enzymes:  Recent Labs Lab 03/22/16 2138 03/23/16 0504 03/23/16 0856  TROPONINI 0.03* 0.04* 0.03*   BNP (last 3 results) No results for input(s): PROBNP in the last 8760 hours. HbA1C: No results for input(s): HGBA1C in the last 72 hours. CBG:  Recent Labs Lab 03/22/16 2241 03/23/16 0611  GLUCAP 144* 143*   Lipid Profile:  Recent Labs  03/23/16 0504  CHOL 111  HDL 37*  LDLCALC 52  TRIG 161  CHOLHDL 3.0   Thyroid Function Tests: No results for input(s): TSH, T4TOTAL, FREET4, T3FREE, THYROIDAB in the last 72 hours. Anemia Panel: No results for input(s): VITAMINB12, FOLATE, FERRITIN, TIBC, IRON, RETICCTPCT in the last 72 hours. Sepsis Labs: No results for input(s): PROCALCITON, LATICACIDVEN in the last 168 hours.  No results found for this or any previous visit (from the past 240 hour(s)).       Radiology Studies: Dg Chest 2 View  Result Date: 03/22/2016 CLINICAL DATA:  82 year old male with  chest pain. Initial encounter. EXAM: CHEST  2 VIEW COMPARISON:  02/09/2016 and earlier. FINDINGS: Calcified aortic atherosclerosis. Stable cardiomegaly and mediastinal contours. Stable visible cervical ACDF hardware. Stable cholecystectomy clips. Stable somewhat low lung volumes. Mildly increased pulmonary vascularity but no overt edema. No pneumothorax, pleural effusion or confluent pulmonary opacity. Small chronic calcified granuloma at the left lung base. No acute osseous abnormality identified. Negative visible bowel gas pattern. IMPRESSION: Stable cardiomegaly and calcified aortic atherosclerosis. Mildly increased pulmonary vascularity but no overt edema  or other acute cardiopulmonary abnormality. Electronically Signed   By: Odessa FlemingH  Hall M.D.   On: 03/22/2016 19:15   Mr Brain Wo Contrast  Result Date: 03/23/2016 CLINICAL DATA:  Initial evaluation for acute dizziness. EXAM: MRI HEAD WITHOUT CONTRAST TECHNIQUE: Multiplanar, multiecho pulse sequences of the brain and surrounding structures were obtained without intravenous contrast. COMPARISON:  Prior CT from 02/11/2016. FINDINGS: Study limited as only axial diffusion, T2 and FLAIR sequences, with sagittal T1 weighted sequences performed. Patient refused to continue. Axial diffusion-weighted sequences demonstrate a 4 mm focus of restricted diffusion within the anterior right centrum semi ovale, compatible with a small acute ischemic infarct. Finding likely reflects a small small vessel type infarct. No other abnormal foci of restricted diffusion identified. Diffuse prominence of the CSF containing spaces compatible with generalized cerebral atrophy. Patchy and confluent T2/FLAIR hyperintensity present within the periventricular and deep white matter, most compatible with chronic microvascular ischemic disease, moderate in nature. Chronic microvascular ischemic changes present within the pons. Normal intracranial vascular flow voids are maintained. Evaluation for  hemorrhage limited due to lack of gradient echo sequence. No mass lesion, midline shift, or mass effect. No hydrocephalus. No extra-axial fluid collection. Major dural sinuses are grossly patent. Craniocervical junction within normal limits. Visualized upper cervical spine unremarkable without significant stenosis or other acute abnormality. Pituitary gland within normal limits. No acute abnormality about the globes and orbits. Patient is status post lens extraction bilaterally. Scattered mucosal thickening within the ethmoidal air cells. Paranasal sinuses are otherwise clear. Trace opacity within the inferior left mastoid air cells. Right mastoid air cells clear. Inner ear structures grossly normal. Bone marrow signal intensity within normal limits. No scalp soft tissue abnormality. IMPRESSION: 1. 4 mm acute ischemic infarct within the anterior right centrum semi ovale, most likely due to acute small vessel disease. 2. No other acute intracranial process. 3. Age-related cerebral atrophy with moderate chronic microvascular ischemic disease. Electronically Signed   By: Rise MuBenjamin  McClintock M.D.   On: 03/23/2016 00:54   Ct Abdomen Pelvis W Contrast  Result Date: 03/23/2016 CLINICAL DATA:  [age] year old patient with epigastric pain for several months. Denies nausea vomiting. History of cholecystectomy. EXAM: CT ABDOMEN AND PELVIS WITH CONTRAST TECHNIQUE: Multidetector CT imaging of the abdomen and pelvis was performed using the standard protocol following bolus administration of intravenous contrast. CONTRAST:  100mL ISOVUE-300 IOPAMIDOL (ISOVUE-300) INJECTION 61% COMPARISON:  CT abdomen and pelvis with contrast 12/21/2015 FINDINGS: The heart is enlarged. Scattered coronary artery calcification noted. Two benign densely calcified granulomas left lower lobe, stable. Negative for consolidation or pleural effusion at the lung bases. Cholecystectomy. Negative for biliary ductal dilatation. Liver size normal. Negative for  hepatic mass. The portal vein is patent. Normal spleen, adrenal glands, and pancreas. Symmetric bilateral cortical thinning of both kidneys, stable. Negative for hydronephrosis. No visible renal stones. Mild nonspecific perinephric stranding is unchanged compared to prior CT. Ureters normal in caliber bilaterally. The stomach is normally positioned and partially filled with oral contrast. Gastric wall appears normal in thickness. Distal esophagus appears normal. The duodenum is normal in caliber and shows no inflammatory change or wall thickening. Small bowel loops are normal in caliber. Terminal ileum unremarkable. Normal appendix. Normal caliber colon. Mild scattered diverticulosis. No colonic wall thickening. Normal appearance of the rectum. Prostamegaly. Prostate gland 6 cm transverse diameter. Slight bladder wall thickening appears stable, and may reflect changes of chronic bladder outlet obstruction. Bilateral fat containing inguinal hernias. Abdominal aorta normal in caliber and contains moderate atherosclerosis. Moderate atherosclerosis of  the bilateral iliac vasculature. The bilateral renal arteries appear patent. There are 2 right and 2 left renal arteries. The mesenteric vessels are patent. Negative for lymphadenopathy or ascites. Stable appearance of the visualized lower thoracic and the lumbar spine. Multiple prominent anterior osteophytes in the visualized thoracic spine. Stable home angioma at L2 vertebral body. Prominent Schmorl stenosis and L2 and L3. Partial fusion of L4 and L5 vertebral bodies and disc desiccation and calcification at L5-S1. Extensive facet joint degenerative change and hypertrophy in the lower lumbar spine. Postsurgical changes of the lumbar spine. IMPRESSION: 1. No acute findings are identified in the abdomen or pelvis. Specifically, no explanation for epigastric pain identified. No significant change identified compared to recent CT abdomen/pelvis of June 2017. 2.  Cholecystectomy. 3. Mild colonic diverticulosis without acute inflammatory change. 4. Prostamegaly and possible probable chronic bladder wall thickening likely related to chronic bladder outlet obstruction. 5. Coronary artery and aortoiliac atherosclerosis. 6. Chronic degenerative changes and postsurgical changes of the lumbar spine. Electronically Signed   By: Britta Mccreedy M.D.   On: 03/23/2016 09:56        Scheduled Meds: . amLODipine  5 mg Oral Daily  . brimonidine  1 drop Both Eyes Daily  . carvedilol  6.25 mg Oral BID WC  . clopidogrel  75 mg Oral Daily  . dextromethorphan-guaiFENesin  1 tablet Oral BID  . furosemide  40 mg Oral Daily  . gabapentin  300 mg Oral QHS  . heparin  5,000 Units Subcutaneous Q8H  . hydrochlorothiazide  12.5 mg Oral Daily  . insulin aspart  0-9 Units Subcutaneous TID WC  . insulin glargine  7 Units Subcutaneous QHS  . latanoprost  1 drop Both Eyes QHS  . mometasone-formoterol  2 puff Inhalation BID  . omega-3 acid ethyl esters  1 g Oral Daily  . pantoprazole  40 mg Oral Daily  . simvastatin  40 mg Oral QPM  . terazosin  5 mg Oral QPM  . tiotropium  18 mcg Inhalation Daily   Continuous Infusions:    LOS: 0 days    Time spent: 30 minutes    Jordan Hawks, DO [facility] Pager [phone]  If 7PM-7AM, please contact night-coverage [email or web address] Password [email or web address] 03/23/2016, 10:21 AM

## 2016-03-23 NOTE — Progress Notes (Signed)
Notified by RN regarding patient not feeling well. Patient was evaluated. On exam, he states that he felt bad but cannot tell me any specific symptoms. He denied any headaches, chest pain, shortness of breath but states that he felt nauseous. He states that he feels better now and denies any other symptoms and states he feels good. Continue to monitor.  Jordan Hawks, DO [facility] Pager [phone]  If 7PM-7AM, please contact night-coverage [email or web address] Password [email or web address] 03/23/2016, 2:30 PM

## 2016-03-23 NOTE — Progress Notes (Signed)
STROKE TEAM PROGRESS NOTE   HISTORY OF PRESENT ILLNESS (per record) NICHOALS Krueger is an 82 y.o. male history of hypertension, hypercholesterolemia, diabetes mellitus, COPD, heart failure and atrial fibrillation, who presented earlier with complaint of an episode of dizziness, more severe than usual. There was no associated diplopia nor focal weakness. There was no change in speech. Symptoms lasted for about one hour then resolved. Patient has been taking Plavix daily. He was prescribed Eliquis in May of this year but has not been taking this medication. MRI of his brain showed a small right acute ischemic infarction in the centrum semiovale region. NIH stroke score at the time of this evaluation was 0.  LSN: 9 AM on 03/22/2016 tPA Given: No: Symptoms resolved; no deficits on exam. mRankin:.    SUBJECTIVE (INTERVAL HISTORY) No family members present. The patient states he has a true aspirin allergy     OBJECTIVE Temp:  [97.8 F (36.6 C)-98.1 F (36.7 C)] 98.1 F (36.7 C) (09/05 0615) Pulse Rate:  [61-92] 89 (09/05 0615) Cardiac Rhythm: Heart block;Bundle branch block (09/05 0159) Resp:  [15-27] 18 (09/05 0615) BP: (124-177)/(73-101) 150/85 (09/05 0615) SpO2:  [94 %-98 %] 94 % (09/05 0615) Weight:  [88.8 kg (195 lb 11.2 oz)-90.9 kg (200 lb 8 oz)] 90.9 kg (200 lb 8 oz) (09/05 0225)  CBC:  Recent Labs Lab 03/22/16 1840  WBC 5.9  NEUTROABS 3.5  HGB 13.5  HCT 40.8  MCV 91.1  PLT 173    Basic Metabolic Panel:  Recent Labs Lab 03/22/16 1840  NA 136  K 3.6  CL 106  CO2 24  GLUCOSE 196*  BUN 8  CREATININE 0.97  CALCIUM 9.3    Lipid Panel:    Component Value Date/Time   CHOL 111 03/23/2016 0504   CHOL 89 06/06/2013 0637   TRIG 108 03/23/2016 0504   TRIG 95 06/06/2013 0637   HDL 37 (L) 03/23/2016 0504   HDL 37 (L) 06/06/2013 0637   CHOLHDL 3.0 03/23/2016 0504   VLDL 22 03/23/2016 0504   VLDL 19 06/06/2013 0637   LDLCALC 52 03/23/2016 0504   LDLCALC 33  06/06/2013 0637   HgbA1c:  Lab Results  Component Value Date   HGBA1C 7.4 (H) 02/10/2016   Urine Drug Screen:    Component Value Date/Time   LABOPIA NONE DETECTED 03/22/2016 1825   COCAINSCRNUR NONE DETECTED 03/22/2016 1825   COCAINSCRNUR NONE DETECTED 02/09/2016 2304   LABBENZ NONE DETECTED 03/22/2016 1825   AMPHETMU NONE DETECTED 03/22/2016 1825   THCU NONE DETECTED 03/22/2016 1825   LABBARB NONE DETECTED 03/22/2016 1825      IMAGING   Dg Chest 2 View 03/22/2016 Stable cardiomegaly and calcified aortic atherosclerosis. Mildly increased pulmonary vascularity but no overt edema or other acute cardiopulmonary abnormality.   Mr Brain Wo Contrast 03/23/2016 1. 4 mm acute ischemic infarct within the anterior right centrum semi ovale, most likely due to acute small vessel disease.  2. No other acute intracranial process.  3. Age-related cerebral atrophy with moderate chronic microvascular ischemic disease.     PHYSICAL EXAM Pleasant elderly Caucasian male currently not in distress. . Afebrile. Head is nontraumatic. Neck is supple without bruit.    Cardiac exam no murmur or gallop. Lungs are clear to auscultation. Distal pulses are well felt.  Neurological Exam ;  Awake  Alert oriented x 3. Normal speech and language.eye movements full without nystagmus.fundi were not visualized. Vision acuity and fields appear normal. Hearing is normal. Palatal movements  are normal. Face symmetric. Tongue midline. Normal strength, tone, reflexes and coordination. Normal sensation. Gait deferred.      ASSESSMENT/PLAN Bobby Krueger is a [age] y.o. male with history of congestive heart failure, hyEssie Christinepertension, hyperlipidemia, diabetes mellitus, COPD, and atrial fibrillation (not anticoagulated) presenting with dizziness. He did not receive IV t-PA due to resolution of deficits.  Stroke:  Non-dominant infarct secondary to small vessel disease clinically silent  Resultant  No deficits  MRI - 4  mm acute ischemic infarct within the anterior right centrum semi ovale  MRA - not performed  Carotid Doppler pending  2D Echo pending  LDL -  52  HgbA1c pending  VTE prophylaxis - subcutaneous heparin  Diet heart healthy/carb modified Room service appropriate? Yes; Fluid consistency: Thin  clopidogrel 75 mg daily prior to admission, now on clopidogrel 75 mg daily  Patient counseled to be compliant with his antithrombotic medications  Ongoing aggressive stroke risk factor management  Therapy recommendations:  Pending  Disposition: Pending  Hypertension  Stable  Permissive hypertension (OK if < 220/120) but gradually normalize in 5-7 days  Long-term BP goal normotensive  Hyperlipidemia  Home meds:  Zocor 40 mg daily resumed in hospital  LDL 52, goal < 70  Continue statin at discharge  Diabetes  HgbA1c pending, goal < 7.0  Uncontrolled  Other Stroke Risk Factors  Advanced age  Cigarette smoker - the patient quit smoking.  Obesity, Body mass index is 32.36 kg/m., recommend weight loss, diet and exercise as appropriate    Other Active Problems  Aspirin allergy  Hospital day # 0  Delton Seeavid Rinehuls PA-C [facility] Pager [phone] 03/23/2016, 3:02 PM I have personally examined this patient, reviewed notes, independently viewed imaging studies, participated in medical decision making and plan of care.ROS completed by me personally and pertinent positives fully documented  I have made any additions or clarifications directly to the above note. Agree with note above. . Patient has a history of atrial fibrillation but has not been compliant with taking his eliquis. He presented with dizziness episode and MRI scan shows a tiny right white matter infarct likely asymptomatic. Patient remains at risk for recurrent stroke and TIAs given history of rectal fibrillation. Patient counseled to be compliant with his medications and take eliquis given his high  CHAD2Vasc score of 8 . Agree with cardiology recommendation of starting eliquis 5 mg twice daily. 50% of time during this 25 minute visit was spent on counseling and coordination of care about stroke and atrial fibrillation risk, treatment and answered questions  Delia HeadyPramod Breleigh Carpino, MD Medical Director [facility] Pager: [phone] 03/23/2016 3:44 PM     To contact Stroke Continuity provider, please refer to [email or web address]. After hours, contact General Neurology

## 2016-03-23 NOTE — Progress Notes (Signed)
Pt. Complaining of not feeling well. Denies chest pain, but has shortness of breath, O2 applied. Notified MD. Awaiting response.

## 2016-03-23 NOTE — Progress Notes (Signed)
Pt admitted to rm 3E02 from ED, pt alert and oriented, denied pain at this time, call bell placed within reach,  Orders carried out.

## 2016-03-23 NOTE — Progress Notes (Signed)
*  PRELIMINARY RESULTS* Vascular Ultrasound Carotid Duplex (Doppler) has been completed.  Preliminary findings: Bilateral: No significant (1-39%) ICA stenosis. Antegrade vertebral flow.   Farrel Demark, RDMS, RVT  03/23/2016, 4:05 PM

## 2016-03-23 NOTE — Progress Notes (Signed)
ANTICOAGULATION CONSULT NOTE - Initial Consult  Pharmacy Consult for apixaban Indication: atrial fibrillation  Allergies  Allergen Reactions  . Aldactone [Spironolactone] Hives  . Aspirin Hives  . Ciprofloxacin Hives  . Cyclobenzaprine Hives  . Lexapro [Escitalopram] Hives    Patient Measurements: Height: 5\' 6"  (167.6 cm) Weight: 200 lb 8 oz (90.9 kg) IBW/kg (Calculated) : 63.8  Vital Signs: Temp: 97.9 F (36.6 C) (09/05 1435) Temp Source: Oral (09/05 1435) BP: 163/81 (09/05 1435) Pulse Rate: 72 (09/05 1435)  Labs:  Recent Labs  03/22/16 1840 03/22/16 2138 03/23/16 0504 03/23/16 0856  HGB 13.5  --   --   --   HCT 40.8  --   --   --   PLT 173  --   --   --   LABPROT  --  14.1  --   --   INR  --  1.09  --   --   CREATININE 0.97  --   --   --   TROPONINI  --  0.03* 0.04* 0.03*    Estimated Creatinine Clearance: 56.6 mL/min (by C-G formula based on SCr of 0.97 mg/dL).   Medical History: Past Medical History:  Diagnosis Date  . A-fib (HCC)   . 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 03/2014: EF 35%, normal nuc in 05/2014 b. echo 11/2015: EF 40-45% w/ diffuse HK and Grade 1 DD  . COPD (chronic obstructive pulmonary disease) (HCC)   . Diabetes mellitus without complication (HCC)   . High cholesterol   . Hypertension     Medications:  Prescriptions Prior to Admission  Medication Sig Dispense Refill Last Dose  . albuterol (PROAIR HFA) 108 (90 Base) MCG/ACT inhaler Inhale 2 puffs into the lungs every 4 (four) hours as needed for wheezing or shortness of breath.    Past Month at Unknown time  . brimonidine (ALPHAGAN) 0.2 % ophthalmic solution Place 1 drop into both eyes daily.    03/22/2016 at Unknown time  . carvedilol (COREG) 6.25 MG tablet Take 6.25 mg by mouth 2 (two) times daily with a meal.    03/22/2016 at 0800  . clopidogrel (PLAVIX) 75 MG tablet Take 75 mg by mouth daily.   03/22/2016 at Unknown time  .  Fluticasone-Salmeterol (ADVAIR DISKUS) 250-50 MCG/DOSE AEPB Inhale 1 puff into the lungs daily as needed (for wheezing or shortness).    03/22/2016 at Unknown time  . furosemide (LASIX) 40 MG tablet Take 1 tablet (40 mg total) by mouth daily. 30 tablet 0 03/22/2016 at Unknown time  . gabapentin (NEURONTIN) 300 MG capsule Take 300 mg by mouth at bedtime.   03/21/2016 at Unknown time  . hydrochlorothiazide (HYDRODIURIL) 25 MG tablet Take 12.5 mg by mouth daily.   03/22/2016 at Unknown time  . ibuprofen (ADVIL,MOTRIN) 400 MG tablet Take 400 mg by mouth every 6 (six) hours as needed for moderate pain.   Past Week at Unknown time  . insulin glargine (LANTUS) 100 UNIT/ML injection Inject 0.08 mLs (8 Units total) into the skin at bedtime. (Patient taking differently: Inject 10 Units into the skin at bedtime. ) 10 mL 0 03/21/2016 at Unknown time  . insulin lispro (HUMALOG) 100 UNIT/ML injection Inject 0-9 Units into the skin 3 (three) times daily before meals. CBG's 70-120 = 0 units; 121-150 =1 units; 151-200 = 2 units; 201-250 =3 units; 251-300 =5 units; 301-350 =7 units; 351-400=9 units and > 400 Notify provider.   unknown at  Unknown time  . ipratropium-albuterol (DUONEB) 0.5-2.5 (3) MG/3ML SOLN Take 3 mLs by nebulization every 6 (six) hours as needed (for SOB).   unknown at unknown  . latanoprost (XALATAN) 0.005 % ophthalmic solution Place 1 drop into both eyes at bedtime.   03/21/2016 at Unknown time  . losartan (COZAAR) 100 MG tablet Take 100 mg by mouth daily.   03/22/2016 at Unknown time  . nateglinide (STARLIX) 120 MG tablet Take 120 mg by mouth daily.   03/22/2016 at Unknown time  . Omega-3 1000 MG CAPS Take 1 g by mouth daily.   03/22/2016 at Unknown time  . omeprazole (PRILOSEC) 20 MG capsule Take 20 mg by mouth daily.   03/22/2016 at Unknown time  . polyvinyl alcohol (LIQUIFILM TEARS) 1.4 % ophthalmic solution Place 1 drop into both eyes as needed for dry eyes. Reported on 12/21/2015   03/22/2016 at Unknown time  .  potassium chloride SA (K-DUR,KLOR-CON) 20 MEQ tablet Take 20 mEq by mouth daily.   03/22/2016 at Unknown time  . promethazine (PHENERGAN) 25 MG tablet Take 12.5-25 mg by mouth every 4 (four) hours as needed.   Past Week at Unknown time  . simvastatin (ZOCOR) 40 MG tablet Take 40 mg by mouth every evening.   03/21/2016 at Unknown time  . SPIRIVA HANDIHALER 18 MCG inhalation capsule Place 1 application into inhaler and inhale daily.   03/22/2016 at Unknown time  . terazosin (HYTRIN) 5 MG capsule Take 5 mg by mouth every evening.   03/21/2016 at Unknown time  . triamcinolone cream (KENALOG) 0.1 % Apply 1 application topically 2 (two) times daily as needed (for irritation).   03/22/2016 at Unknown time  . zolpidem (AMBIEN) 10 MG tablet Take 10 mg by mouth at bedtime as needed for sleep.    03/21/2016 at Unknown time  . apixaban (ELIQUIS) 5 MG TABS tablet Take 1 tablet (5 mg total) by mouth 2 (two) times daily. (Patient not taking: Reported on 03/22/2016) 60 tablet 0 Not Taking at Unknown time  . ketorolac (ACULAR) 0.5 % ophthalmic solution Place 1 drop into the right eye 4 (four) times daily. (Patient not taking: Reported on 03/22/2016) 5 mL 0 Not Taking at Unknown time  . predniSONE (STERAPRED UNI-PAK 21 TAB) 10 MG (21) TBPK tablet Take 1 tablet (10 mg total) by mouth daily. 40mg  x1 day, 20mg  x2 day, 10mg  x2 day, then stop (Patient not taking: Reported on 03/22/2016) 10 tablet 0 Not Taking at Unknown time  . trimethoprim-polymyxin b (POLYTRIM) ophthalmic solution Place 2 drops into the right eye every 6 (six) hours. (Patient not taking: Reported on 03/22/2016) 10 mL 0 Not Taking at Unknown time    Assessment: 82 y/o male admitted for chest pain and dizziness found to have an acute stroke. Pharmacy consulted to begin apixaban for Afib and hx stroke. He was previously on apixaban but was noncompliant. Renal function is normal. CBC is normal. Heparin for VTE prophylaxis has already been discontinued. Previous dosing was  appropriate for weight and SCr.  Goal of Therapy:  stroke prevention   Plan:  Resume apixaban 5 mg PO bid Pharmacy to complete re-education Pharmacy signing off - but will follow peripherally to watch for s/sx of bleeding or change in renal function  Loura Back, PharmD, BCPS Clinical Pharmacist Phone for tonight [phone] Main pharmacy - [phone] 03/23/2016 3:53 PM

## 2016-03-23 NOTE — Consult Note (Signed)
Cardiology Consult    Patient ID: Bobby Krueger MRN: [id number], DOB/AGE: 1927/06/08   Admit date: 03/22/2016 Date of Consult: 03/23/2016  Primary Physician: [name]., MD Reason for Consult: Chest Pain, Atrial Fibrillation Primary Cardiologist: Dr. Fabienne Bruns, [place] Requesting Provider: Dr. Fabio Neighbors   History of Present Illness    Bobby Krueger is a 82 y.o. male with past medical history of PAF (initially diagnosed in 11/2015, started on Eliquis), chronic combined systolic and diastolic CHF, known LBBB, Type 2 DM, HTN, and HLD who presented to [facility] ED on 03/22/2016 for progressive dizziness and chest pain.   He was recently admitted at [facility] from 7/24 - 7/29 for altered mental status and dizziness. He was found to be hypoglycemic and bradycardiac that admission. He was continued on Eliquis and Coreg was discontinued.  Patient reports he initially went to rehab following his July hospital admission but has since returned home. Says he lives by himself and performs ADL's independently. Ambulates with a cane. He is unsure what medications he takes but says he puts them in a container for 2 weeks at a time. Unsure if he was taking Eliquis.  He reports having acute onset dizziness and chest discomfort on 9/4. Says the dizziness was present at rest and felt like the room was spinning. Also had a pressure across his precordium which was present at rest and did not worsen with exertion. Lasted for over 2 hours. Reports having similar symptoms in the past and says it usually resolves spontaneously. No positional component noted. Has not tried taking SL NTG for the pain. Denies any repeat episodes of chest discomfort since being admitted. Says his dizziness has improved.   While admitted, labs have shown a WBC of 5.9, Hgb 13.5, and platelets 173. K+ 3.6. Creatinine 0.97. BNP 482. Lipase 16. Cyclic troponin values have been flat at 0.03, 0.04, and 0.03. Lipid Panel with total  cholesterol 111, HDL 37, LDL 52. EKG showed atrial fibrillation, HR 75, with known LBBB (no acute changes when compared to previous tracings). CXR with stable cardiomegaly and calcified aortic atherosclerosis. Mildly increased pulmonary vascularity but no overt edema or other acute cardiopulmonary abnormality. MRI of the brain has shown a 4 mm acute ischemic infarct within the anterior right centrum semi ovale, most likely due to acute small vessel disease. CT Abdomen with no acute findings, but coronary artery and aortoiliac atherosclerosis noted.   Echocardiogram in 11/2015 showed an EF of 40% to 45% with hypokinesis of the anteroseptal, inferior, and inferoseptal myocardium. Grade 1 DD noted with severely dilated LA and PA peak pressure of 45 mm Hg (S). Echo in 03/2014 showed EF of 35% by review of Care Everywhere. Also had a NST in 05/2014 which showed normal myocardial thickening and wall motion with no abnormalities noted.  Past Medical History   Past Medical History:  Diagnosis Date  . A-fib (HCC)   . 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 03/2014: EF 35%, normal nuc in 05/2014 b. echo 11/2015: EF 40-45% w/ diffuse HK and Grade 1 DD  . COPD (chronic obstructive pulmonary disease) (HCC)   . Diabetes mellitus without complication (HCC)   . High cholesterol   . Hypertension     Past Surgical History:  Procedure Laterality Date  . BACK SURGERY    . CARPAL TUNNEL RELEASE    . CHOLECYSTECTOMY       Allergies  Allergies  Allergen  Reactions  . Aldactone [Spironolactone] Hives  . Aspirin Hives  . Ciprofloxacin Hives  . Cyclobenzaprine Hives  . Lexapro [Escitalopram] Hives    Inpatient Medications    . amLODipine  5 mg Oral Daily  . brimonidine  1 drop Both Eyes Daily  . carvedilol  6.25 mg Oral BID WC  . clopidogrel  75 mg Oral Daily  . dextromethorphan-guaiFENesin  1 tablet Oral BID  . furosemide  40 mg Oral Daily  .  gabapentin  300 mg Oral QHS  . heparin  5,000 Units Subcutaneous Q8H  . hydrochlorothiazide  12.5 mg Oral Daily  . insulin aspart  0-9 Units Subcutaneous TID WC  . insulin glargine  7 Units Subcutaneous QHS  . latanoprost  1 drop Both Eyes QHS  . mometasone-formoterol  2 puff Inhalation BID  . omega-3 acid ethyl esters  1 g Oral Daily  . pantoprazole  40 mg Oral Daily  . simvastatin  40 mg Oral QPM  . terazosin  5 mg Oral QPM  . tiotropium  18 mcg Inhalation Daily    Family History    Family History  Problem Relation Age of Onset  . Prostate cancer      Social History    Social History   Social History  . Marital status: Widowed    Spouse name: N/A  . Number of children: N/A  . Years of education: N/A   Occupational History  . retired    Social History Main Topics  . Smoking status: Former Games developer  . Smokeless tobacco: Never Used  . Alcohol use No  . Drug use: No  . Sexual activity: Not on file   Other Topics Concern  . Not on file   Social History Narrative  . No narrative on file     Review of Systems    General:  No chills, fever, night sweats or weight changes.  Cardiovascular:  No dyspnea on exertion, edema, orthopnea, palpitations, paroxysmal nocturnal dyspnea. Positive for chest pain.  Dermatological: No rash, lesions/masses Respiratory: No cough, dyspnea Urologic: No hematuria, dysuria Abdominal:   No nausea, vomiting, diarrhea, bright red blood per rectum, melena, or hematemesis Neurologic:  No visual changes, wkns, changes in mental status. Positive for dizziness.  All other systems reviewed and are otherwise negative except as noted above.  Physical Exam    Blood pressure (!) 163/81, pulse 70, temperature 97.9 F (36.6 C), temperature source Oral, resp. rate 19, height 5\' 6"  (1.676 m), weight 200 lb 8 oz (90.9 kg), SpO2 95 %.  General: Pleasant, elderly Caucasian male appearing in NAD. Psych: Normal affect. Neuro: Alert and oriented X 3.  Moves all extremities spontaneously. HEENT: Normal  Neck: Supple without bruits or JVD. Lungs:  Resp regular and unlabored, CTA without wheezing or rales. Heart: RRR no s3, s4, or murmurs. Abdomen: Soft, non-tender, non-distended, BS + x 4.  Extremities: No clubbing, cyanosis or edema. DP/PT/Radials 2+ and equal bilaterally.  Labs    Troponin [facility] Test)  Recent Labs  03/22/16 1842  TROPIPOC 0.00    Recent Labs  03/22/16 2138 03/23/16 0504 03/23/16 0856  TROPONINI 0.03* 0.04* 0.03*   Lab Results  Component Value Date   WBC 5.9 03/22/2016   HGB 13.5 03/22/2016   HCT 40.8 03/22/2016   MCV 91.1 03/22/2016   PLT 173 03/22/2016     Recent Labs Lab 03/22/16 1840  NA 136  K 3.6  CL 106  CO2 24  BUN 8  CREATININE 0.97  CALCIUM 9.3  PROT 6.3*  BILITOT 0.7  ALKPHOS 77  ALT 13*  AST 16  GLUCOSE 196*   Lab Results  Component Value Date   CHOL 111 03/23/2016   HDL 37 (L) 03/23/2016   LDLCALC 52 03/23/2016   TRIG 108 03/23/2016   No results found for: [facility]   Radiology Studies    Dg Chest 2 View  Result Date: 03/22/2016 CLINICAL DATA:  82 year old male with chest pain. Initial encounter. EXAM: CHEST  2 VIEW COMPARISON:  02/09/2016 and earlier. FINDINGS: Calcified aortic atherosclerosis. Stable cardiomegaly and mediastinal contours. Stable visible cervical ACDF hardware. Stable cholecystectomy clips. Stable somewhat low lung volumes. Mildly increased pulmonary vascularity but no overt edema. No pneumothorax, pleural effusion or confluent pulmonary opacity. Small chronic calcified granuloma at the left lung base. No acute osseous abnormality identified. Negative visible bowel gas pattern. IMPRESSION: Stable cardiomegaly and calcified aortic atherosclerosis. Mildly increased pulmonary vascularity but no overt edema or other acute cardiopulmonary abnormality. Electronically Signed   By: Odessa Fleming M.D.   On: 03/22/2016 19:15   Mr Brain Wo Contrast  Result Date:  03/23/2016 CLINICAL DATA:  Initial evaluation for acute dizziness. EXAM: MRI HEAD WITHOUT CONTRAST TECHNIQUE: Multiplanar, multiecho pulse sequences of the brain and surrounding structures were obtained without intravenous contrast. COMPARISON:  Prior CT from 02/11/2016. FINDINGS: Study limited as only axial diffusion, T2 and FLAIR sequences, with sagittal T1 weighted sequences performed. Patient refused to continue. Axial diffusion-weighted sequences demonstrate a 4 mm focus of restricted diffusion within the anterior right centrum semi ovale, compatible with a small acute ischemic infarct. Finding likely reflects a small small vessel type infarct. No other abnormal foci of restricted diffusion identified. Diffuse prominence of the CSF containing spaces compatible with generalized cerebral atrophy. Patchy and confluent T2/FLAIR hyperintensity present within the periventricular and deep white matter, most compatible with chronic microvascular ischemic disease, moderate in nature. Chronic microvascular ischemic changes present within the pons. Normal intracranial vascular flow voids are maintained. Evaluation for hemorrhage limited due to lack of gradient echo sequence. No mass lesion, midline shift, or mass effect. No hydrocephalus. No extra-axial fluid collection. Major dural sinuses are grossly patent. Craniocervical junction within normal limits. Visualized upper cervical spine unremarkable without significant stenosis or other acute abnormality. Pituitary gland within normal limits. No acute abnormality about the globes and orbits. Patient is status post lens extraction bilaterally. Scattered mucosal thickening within the ethmoidal air cells. Paranasal sinuses are otherwise clear. Trace opacity within the inferior left mastoid air cells. Right mastoid air cells clear. Inner ear structures grossly normal. Bone marrow signal intensity within normal limits. No scalp soft tissue abnormality. IMPRESSION: 1. 4 mm acute  ischemic infarct within the anterior right centrum semi ovale, most likely due to acute small vessel disease. 2. No other acute intracranial process. 3. Age-related cerebral atrophy with moderate chronic microvascular ischemic disease. Electronically Signed   By: Rise Mu M.D.   On: 03/23/2016 00:54   Ct Abdomen Pelvis W Contrast  Result Date: 03/23/2016 CLINICAL DATA:  82 year old patient with epigastric pain for several months. Denies nausea vomiting. History of cholecystectomy. EXAM: CT ABDOMEN AND PELVIS WITH CONTRAST TECHNIQUE: Multidetector CT imaging of the abdomen and pelvis was performed using the standard protocol following bolus administration of intravenous contrast. CONTRAST:  ISOVUE-300 IOPAMIDOL (ISOVUE-300) INJECTION 61% COMPARISON:  CT abdomen and pelvis with contrast 12/21/2015 FINDINGS: The heart is enlarged. Scattered coronary artery calcification noted. Two benign densely calcified granulomas left lower lobe,  stable. Negative for consolidation or pleural effusion at the lung bases. Cholecystectomy. Negative for biliary ductal dilatation. Liver size normal. Negative for hepatic mass. The portal vein is patent. Normal spleen, adrenal glands, and pancreas. Symmetric bilateral cortical thinning of both kidneys, stable. Negative for hydronephrosis. No visible renal stones. Mild nonspecific perinephric stranding is unchanged compared to prior CT. Ureters normal in caliber bilaterally. The stomach is normally positioned and partially filled with oral contrast. Gastric wall appears normal in thickness. Distal esophagus appears normal. The duodenum is normal in caliber and shows no inflammatory change or wall thickening. Small bowel loops are normal in caliber. Terminal ileum unremarkable. Normal appendix. Normal caliber colon. Mild scattered diverticulosis. No colonic wall thickening. Normal appearance of the rectum. Prostamegaly. Prostate gland 6 cm transverse diameter. Slight  bladder wall thickening appears stable, and may reflect changes of chronic bladder outlet obstruction. Bilateral fat containing inguinal hernias. Abdominal aorta normal in caliber and contains moderate atherosclerosis. Moderate atherosclerosis of the bilateral iliac vasculature. The bilateral renal arteries appear patent. There are 2 right and 2 left renal arteries. The mesenteric vessels are patent. Negative for lymphadenopathy or ascites. Stable appearance of the visualized lower thoracic and the lumbar spine. Multiple prominent anterior osteophytes in the visualized thoracic spine. Stable home angioma at L2 vertebral body. Prominent Schmorl stenosis and L2 and L3. Partial fusion of L4 and L5 vertebral bodies and disc desiccation and calcification at L5-S1. Extensive facet joint degenerative change and hypertrophy in the lower lumbar spine. Postsurgical changes of the lumbar spine. IMPRESSION: 1. No acute findings are identified in the abdomen or pelvis. Specifically, no explanation for epigastric pain identified. No significant change identified compared to recent CT abdomen/pelvis of June 2017. 2. Cholecystectomy. 3. Mild colonic diverticulosis without acute inflammatory change. 4. Prostamegaly and possible probable chronic bladder wall thickening likely related to chronic bladder outlet obstruction. 5. Coronary artery and aortoiliac atherosclerosis. 6. Chronic degenerative changes and postsurgical changes of the lumbar spine. Electronically Signed   By: Britta MccreedySusan  Turner M.D.   On: 03/23/2016 09:56    EKG & Cardiac Imaging    EKG:  Atrial fibrillation, HR 75, with known LBBB (no acute changes when compared to previous tracings).   Echocardiogram: 11/2015 Study Conclusions  - Left ventricle: The cavity size was normal. Systolic function was   mildly to moderately reduced. The estimated ejection fraction was   in the range of 40% to 45%. Hypokinesis of the anteroseptal,   inferior, and inferoseptal  myocardium. Doppler parameters are   consistent with abnormal left ventricular relaxation (grade 1   diastolic dysfunction). - Aortic valve: Transvalvular velocity was within the normal range.   There was no stenosis. There was no regurgitation. - Mitral valve: Transvalvular velocity was within the normal range.   There was no evidence for stenosis. There was trivial   regurgitation. - Left atrium: The atrium was severely dilated. - Right ventricle: The cavity size was normal. Wall thickness was   normal. Systolic function was normal. - Atrial septum: No defect or patent foramen ovale was identified   by color flow Doppler. - Tricuspid valve: There was trivial regurgitation. - Pulmonary arteries: Systolic pressure was mildly increased. PA   peak pressure: 45 mm Hg (S). - Inferior vena cava: The vessel was normal in size. The   respirophasic diameter changes were in the normal range (>= 50%),   consistent with normal central venous pressure.   Nuclear Stress Test: 05/2014 FINDINGS: Regional wall motion:reveals normal myocardial thickening and  wall  motion. The overall quality of the study is good. Artifacts noted: no Left ventricular cavity: normal.  Perfusion Analysis:SPECT images demonstrate homogeneous tracer  distribution throughout the myocardium.  Assessment & Plan    1. Chest Pain/ CAD - reports having episodes of chest discomfort for years, usually lasting for approximately 1 hour then resolving. No associated symptoms and no associated with exertion.  - Cyclic troponin values have been flat at 0.03, 0.04, and 0.03 and EKG shows atrial fibrillation, HR 75, with known LBBB (no acute changes when compared to previous tracings). - Echo in 11/2015 showed an EF of 40% to 45% with hypokinesis of the anteroseptal, inferior, and inferoseptal myocardium, with previous echo in 03/2014 showing an EF of 35% by review of Care Everywhere. Also had a NST in 05/2014 which showed  normal myocardial thickening and wall motion with no abnormalities noted. CT's show coronary and aortic calcifications. - repeat echocardiogram pending to assess EF. If significantly decreased may need to consider repeat ischemic evaluation. Symptoms seem most consistent with stable angina. Continue Amlodipine, Plavix, BB, and statin. If restarting Eliquis, may need to stop Plavix due to risk of bleeding in this 82 yo male. Denies any recent falls. Would benefit from having SL NTG at time to discharge.   2. Chronic Combined Systolic and Diastolic CHF - Echo in 11/2015 showed an EF of 40% to 45% with hypokinesis of the anteroseptal, inferior, and inferoseptal myocardium. Grade 1 DD noted with severely dilated LA and PA peak pressure of 45 mm Hg (S). Echo in 03/2014 showed EF of 35% by review of Care Everywhere. Also had a NST in 05/2014 which showed normal myocardial thickening and wall motion with no abnormalities noted. - does not appear overly volume overloaded on physical exam.  - continue BB (monitor on telemetry for bradycardia as this was discontinued in the past) and PO Lasix. Losartan currently held (allow for permissive HTN in setting of CVA) .   3. Persistent Atrial Fibrillation - in rate-controlled atrial fibrillation upon admission, now in NSR. Initially diagnosed in 11/2015. TSH normal in 01/2016. - This patients CHA2DS2-VASc Score and unadjusted Ischemic Stroke Rate (% per year) is equal to 10.8 % stroke rate/year from a score of 8 (CHF, HTN, DM, Vascular, Age (2), Stroke (2)). Was prescribed Eliquis but unclear if he has been taking this since leaving rehab in 02/2016. Would recommend restarting Eliquis  BID once ok with Neurology. - currently receiving Coreg 6.25mg  BID. This was stopped during a previous hospitalization due to bradycardia. HR currently in the 60's - 70's. Continue to monitor on telemetry.   4. Acute CVA - MRI of the brain this admission shows a 4 mm acute ischemic  infarct within the anterior right centrum semi ovale, most likely due to acute small vessel disease. - Neurology following.   Signed, Ellsworth Lennox, PA-C 03/23/2016, 1:39 PM Pager: [phone]   I have personally seen and examined this patient with Randall An, PA-C. I agree with the assessment and plan as outlined above. He is known to have a cardiomyopathy (presumed to be non-ischemic) and PAF and has been followed by Dr. Lady Gary in [place], [place]. He was started in Eliquis in May 2017 during an admission at [facility] but he never started this. Admitted here with dizziness and found to have acute CVA. Neurology is following. He is in atrial fibrillation. He has coronary calcification noted on CT scan. He has noted chest pain before admission, occurring several days per week  for the last two years. He has been managed conservatively as an outpatient due to advanced age.  My exam shows an elderly male in NAD. CV: Irreg irreg. Lungs: clear bilaterally. Ext: no edema. Abd: soft, NT Labs reviewed by me. Troponin 0.04 (flat trend) EKG reviewed by me shows atrial fibrillation, prolonged QTc (chronic) 1. Chest pain: Evidence of CAD on his CT scan. His symptoms are c/w chronic stable angina. Echo today to reassess LVEF. If unchanged, would not pursue further ischemic testing at this time. Continue current meds. If we elect to restart Eliquis, would not restart Plavix 2. Atrial fib, persistent: He is rate controlled. Consider restarting Eliquis if ok with Neurology after recent CVA.  3. Cardiomyopathy/Chronic systolic CHF: Volume status is ok. Continue current meds.  4. Acute CVA: Neurology following. High risk of recurrent CVA with persistent atrial fib off of Eliquis (he had not been taking for at least 2 months). Consider Eliquis if ok with Neurology. May need to d/c Plavix.   Verne Carrow 03/23/2016 2:38 PM

## 2016-03-23 NOTE — Progress Notes (Signed)
Physical Therapy Evaluation Patient Details Name: Bobby Krueger MRN: [id number] DOB: 14-Feb-1927 Today's Date: 03/23/2016   History of Present Illness  82 yo male admitted with dizziness and MRI(+_) R centrum semiovale infarct. PMH:  Clinical Impression  Pt admitted with/for dizziness and found to have infarct per MRI.  Pt is at min guard level of mobility in general at this time, which is better than on OT eval earlier in the day.  Main problem for returning directly home--son works days and pt has no one to stay in the home with him at least initially.   Pt currently limited functionally due to the problems listed. ( See problems list.)   Pt will benefit from PT to maximize function and safety in order to get ready for next venue listed below.     Follow Up Recommendations SNF (pt may push to go home and need super. for mobility.)    Equipment Recommendations  None recommended by PT    Recommendations for Other Services       Precautions / Restrictions Precautions Precautions: Fall      Mobility  Bed Mobility Overal bed mobility: Needs Assistance Bed Mobility: Sidelying to Sit;Sit to Sidelying   Sidelying to sit: Supervision     Sit to sidelying: Supervision General bed mobility comments: even scooted up in bed  Transfers Overall transfer level: Needs assistance Equipment used: Straight cane Transfers: Sit to/from Stand Sit to Stand: Min guard         General transfer comment: used cane appropriately, no LOB  Ambulation/Gait Ambulation/Gait assistance: Min guard Ambulation Distance (Feet): 190 Feet Assistive device: Straight cane Gait Pattern/deviations: Step-through pattern Gait velocity: slower Gait velocity interpretation: Below normal speed for age/gender General Gait Details: used cane appropriately.  Gait tentative with few episodes of mild unsteadiness, but generally steady.  Stairs Stairs: Yes Stairs assistance: Min guard Stair Management: One rail  Left;With cane;Alternating pattern;Forwards Number of Stairs: 3 General stair comments: safe and steady with rail and cane  Wheelchair Mobility    Modified Rankin (Stroke Patients Only) Modified Rankin (Stroke Patients Only) Pre-Morbid Rankin Score: No symptoms Modified Rankin: Moderate disability     Balance Overall balance assessment: No apparent balance deficits (not formally assessed) Sitting-balance support: Bilateral upper extremity supported;Feet supported Sitting balance-Leahy Scale: Good     Standing balance support: Single extremity supported;During functional activity Standing balance-Leahy Scale: Poor (used cane appropriately)                               Pertinent Vitals/Pain Pain Assessment: No/denies pain    Home Living Family/patient expects to be discharged to:: Private residence Living Arrangements: Alone Available Help at Discharge: Family;Available PRN/intermittently (son ) Type of Home: House Home Access: Stairs to enter Entrance Stairs-Rails: Right Entrance Stairs-Number of Steps: 3 Home Layout: One level Home Equipment: Cane - single point;Walker - 2 wheels;Walker - 4 wheels;Shower seat - built in;Grab bars - tub/shower Additional Comments: Son lives close by and provides daily check in. reports driving to grocery store alone.     Prior Function Level of Independence: Independent with assistive device(s)         Comments: Mod indep with use of SPC vs. RW (primarily SPC).  Denies fall history within previous six months.     Hand Dominance   Dominant Hand: Right    Extremity/Trunk Assessment   Upper Extremity Assessment: Overall WFL for tasks assessed;LUE deficits/detail  LUE Deficits / Details: reports soreness 4th digit at MCP. bruise noted and states "it did go all the way around. I dont know how i did it"   Lower Extremity Assessment: Overall WFL for tasks assessed (mild proximal weaknesses only, no focal  weakness)      Cervical / Trunk Assessment: Kyphotic  Communication   Communication: HOH (very HOH)  Cognition Arousal/Alertness: Awake/alert Behavior During Therapy: WFL for tasks assessed/performed                        General Comments General comments (skin integrity, edema, etc.): bruise on hand and reports pain but unabl to recall how it occurred    Exercises        Assessment/Plan    PT Assessment Patient needs continued PT services  PT Diagnosis Generalized weakness;Abnormality of gait   PT Problem List Decreased strength;Decreased activity tolerance;Decreased balance;Decreased mobility  PT Treatment Interventions Gait training;DME instruction;Stair training;Functional mobility training;Therapeutic activities;Patient/family education;Balance training   PT Goals (Current goals can be found in the Care Plan section) Acute Rehab PT Goals Patient Stated Goal: to return home to 2 dogs ( patient forgot about dogs until OT asked a second time and states "Oh yeah there is two of them." pt reports being the main caregiver for the animals PT Goal Formulation: With patient Time For Goal Achievement: 03/30/16 Potential to Achieve Goals: Good    Frequency Min 3X/week   Barriers to discharge Decreased caregiver support (son works days, but can be to patient quickly, per pt.)      Co-evaluation               End of Session   Activity Tolerance: Patient tolerated treatment well Patient left: Other (comment) (on toilet with direction to call for the nurse.) Nurse Communication: Mobility status         Time: 1730-1747 PT Time Calculation (min) (ACUTE ONLY): 17 min   Charges:   PT Evaluation $PT Eval Moderate Complexity: 1 Procedure     PT G Codes:        Avryl Roehm, Eliseo Gum 03/23/2016, 5:55 PM 03/23/2016  Dahlgren Bing, PT [phone]  (pager)

## 2016-03-23 NOTE — Consult Note (Signed)
Admission H&P    Chief Complaint: Transient dizziness; abnormal MRI.  HPI: Bobby Krueger is an 82 y.o. male history of hypertension, hypercholesterolemia, diabetes mellitus, COPD, heart failure and atrial fibrillation, who presented earlier with complaint of an episode of dizziness, more severe than usual. There was no associated diplopia nor focal weakness. There was no change in speech. Symptoms lasted for about one hour then resolved. Patient has been taking Plavix daily. He was prescribed Eliquis in May of this year but has not been taking this medication. MRI of his brain showed a small right acute ischemic infarction in the centrum semiovale region. NIH stroke score at the time of this evaluation was 0.  LSN: 9 AM on 03/22/2016 tPA Given: No: Symptoms resolved; no deficits on exam. mRankin:  Past Medical History:  Diagnosis Date  . A-fib ([place])   . CHF (congestive heart failure) ([place])   . Chronic combined systolic (congestive) and diastolic (congestive) heart failure ([place])   . COPD (chronic obstructive pulmonary disease) ([place])   . Diabetes mellitus without complication ([place])   . High cholesterol   . Hypertension     Past Surgical History:  Procedure Laterality Date  . BACK SURGERY    . CARPAL TUNNEL RELEASE    . CHOLECYSTECTOMY      Family History  Problem Relation Age of Onset  . Prostate cancer     Social History:  reports that he has quit smoking. He has never used smokeless tobacco. He reports that he does not drink alcohol or use drugs.  Allergies:  Allergies  Allergen Reactions  . Aldactone [Spironolactone] Hives  . Aspirin Hives  . Ciprofloxacin Hives  . Cyclobenzaprine Hives  . Lexapro [Escitalopram] Hives    Medications Prior to Admission  Medication Sig Dispense Refill  . albuterol (PROAIR HFA) 108 (90 Base) MCG/ACT inhaler Inhale 2 puffs into the lungs every 4 (four) hours as needed for wheezing or shortness of breath.     . brimonidine (ALPHAGAN) 0.2  % ophthalmic solution Place 1 drop into both eyes daily.     . carvedilol (COREG) 6.25 MG tablet Take 6.25 mg by mouth 2 (two) times daily with a meal.     . clopidogrel (PLAVIX) 75 MG tablet Take 75 mg by mouth daily.    . Fluticasone-Salmeterol (ADVAIR DISKUS) 250-50 MCG/DOSE AEPB Inhale 1 puff into the lungs daily as needed (for wheezing or shortness).     . furosemide (LASIX) 40 MG tablet Take 1 tablet (40 mg total) by mouth daily. 30 tablet 0  . gabapentin (NEURONTIN) 300 MG capsule Take 300 mg by mouth at bedtime.    . hydrochlorothiazide (HYDRODIURIL) 25 MG tablet Take 12.5 mg by mouth daily.    Marland Kitchen ibuprofen (ADVIL,MOTRIN) 400 MG tablet Take 400 mg by mouth every 6 (six) hours as needed for moderate pain.    Marland Kitchen insulin glargine (LANTUS) 100 UNIT/ML injection Inject 0.08 mLs (8 Units total) into the skin at bedtime. (Patient taking differently: Inject 10 Units into the skin at bedtime. ) 10 mL 0  . insulin lispro (HUMALOG) 100 UNIT/ML injection Inject 0-9 Units into the skin 3 (three) times daily before meals. CBG's 70-120 = 0 units; 121-150 =1 units; 151-200 = 2 units; 201-250 =3 units; 251-300 =5 units; 301-350 =7 units; 351-400=9 units and > 400 Notify provider.    Marland Kitchen ipratropium-albuterol (DUONEB) 0.5-2.5 (3) MG/3ML SOLN Take 3 mLs by nebulization every 6 (six) hours as needed (for SOB).    Marland Kitchen  latanoprost (XALATAN) 0.005 % ophthalmic solution Place 1 drop into both eyes at bedtime.    Marland Kitchen losartan (COZAAR) 100 MG tablet Take 100 mg by mouth daily.    . nateglinide (STARLIX) 120 MG tablet Take 120 mg by mouth daily.    . Omega-3 1000 MG CAPS Take 1 g by mouth daily.    Marland Kitchen omeprazole (PRILOSEC) 20 MG capsule Take 20 mg by mouth daily.    . polyvinyl alcohol (LIQUIFILM TEARS) 1.4 % ophthalmic solution Place 1 drop into both eyes as needed for dry eyes. Reported on 12/21/2015    . potassium chloride SA (K-DUR,KLOR-CON) 20 MEQ tablet Take 20 mEq by mouth daily.    . promethazine (PHENERGAN) 25 MG  tablet Take 12.5-25 mg by mouth every 4 (four) hours as needed.    . simvastatin (ZOCOR) 40 MG tablet Take 40 mg by mouth every evening.    Marland Kitchen SPIRIVA HANDIHALER 18 MCG inhalation capsule Place 1 application into inhaler and inhale daily.    Marland Kitchen terazosin (HYTRIN) 5 MG capsule Take 5 mg by mouth every evening.    . triamcinolone cream (KENALOG) 0.1 % Apply 1 application topically 2 (two) times daily as needed (for irritation).    Marland Kitchen zolpidem (AMBIEN) 10 MG tablet Take 10 mg by mouth at bedtime as needed for sleep.     Marland Kitchen apixaban (ELIQUIS) 5 MG TABS tablet Take 1 tablet (5 mg total) by mouth 2 (two) times daily. (Patient not taking: Reported on 03/22/2016) 60 tablet 0  . ketorolac (ACULAR) 0.5 % ophthalmic solution Place 1 drop into the right eye 4 (four) times daily. (Patient not taking: Reported on 03/22/2016) 5 mL 0  . predniSONE (STERAPRED UNI-PAK 21 TAB) 10 MG (21) TBPK tablet Take 1 tablet (10 mg total) by mouth daily. 86m x1 day, 258mx2 day, 1040m2 day, then stop (Patient not taking: Reported on 03/22/2016) 10 tablet 0  . trimethoprim-polymyxin b (POLYTRIM) ophthalmic solution Place 2 drops into the right eye every 6 (six) hours. (Patient not taking: Reported on 03/22/2016) 10 mL 0    ROS: History obtained from the patient  General ROS: negative for - chills, fatigue, fever, night sweats, weight gain or weight loss Psychological ROS: negative for - behavioral disorder, hallucinations, memory difficulties, mood swings or suicidal ideation Ophthalmic ROS: negative for - blurry vision, double vision, eye pain or loss of vision ENT ROS: Recurrent bouts of dizziness of mild severity Allergy and Immunology ROS: negative for - hives or itchy/watery eyes Hematological and Lymphatic ROS: negative for - bleeding problems, bruising or swollen lymph nodes Endocrine ROS: negative for - galactorrhea, hair pattern changes, polydipsia/polyuria or temperature intolerance Respiratory ROS: negative for - cough,  hemoptysis, shortness of breath or wheezing Cardiovascular ROS: Complains of nonradiating chest pain Gastrointestinal ROS: negative for - abdominal pain, diarrhea, hematemesis, nausea/vomiting or stool incontinence Genito-Urinary ROS: negative for - dysuria, hematuria, incontinence or urinary frequency/urgency Musculoskeletal ROS: negative for - joint swelling or muscular weakness Neurological ROS: as noted in HPI Dermatological ROS: negative for rash and skin lesion changes  Physical Examination: Blood pressure 124/77, pulse 86, temperature 97.8 F (36.6 C), temperature source Oral, resp. rate 17, weight 88.8 kg (195 lb 11.2 oz), SpO2 94 %.  HEENT-  Normocephalic, no lesions, without obvious abnormality.  Normal external eye and conjunctiva.  Normal TM's bilaterally.  Normal auditory canals and external ears. Normal external nose, mucus membranes and septum.  Normal pharynx. Neck supple with no masses, nodes, nodules or  enlargement. Cardiovascular - regular rate and rhythm, S1, S2 normal, no murmur, click, rub or gallop Lungs - chest clear, no wheezing, rales, normal symmetric air entry Abdomen - soft, non-tender; bowel sounds normal; no masses,  no organomegaly Extremities - no joint deformities, effusion, or inflammation and no edema  Neurologic Examination: Mental Status: Alert, oriented, thought content appropriate.  Speech fluent without evidence of aphasia. Able to follow commands without difficulty. Cranial Nerves: II-Visual fields were normal. III/IV/VI-Pupils were equal and reacted normally to light. Extraocular movements were full and conjugate.    V/VII-no facial numbness and no facial weakness. VIII-normal. X-normal speech and symmetrical palatal movement. XI: trapezius strength/neck flexion strength normal bilaterally XII-midline tongue extension with normal strength. Motor: 5/5 bilaterally with normal tone and bulk Sensory: Normal throughout. Deep Tendon Reflexes: 1+  and symmetric in upper extremities and absent in lower extremities. Plantars: Flexor bilaterally Cerebellar: Normal finger-to-nose testing. Carotid auscultation: Normal  Results for orders placed or performed during the hospital encounter of 03/22/16 (from the past 48 hour(s))  Urinalysis, Routine w reflex microscopic     Status: Abnormal   Collection Time: 03/22/16  6:25 PM  Result Value Ref Range   Color, Urine YELLOW YELLOW   APPearance CLEAR CLEAR   Specific Gravity, Urine 1.015 1.005 - 1.030   pH 6.0 5.0 - 8.0   Glucose, UA 250 (A) NEGATIVE mg/dL   Hgb urine dipstick SMALL (A) NEGATIVE   Bilirubin Urine NEGATIVE NEGATIVE   Ketones, ur NEGATIVE NEGATIVE mg/dL   Protein, ur 30 (A) NEGATIVE mg/dL   Nitrite NEGATIVE NEGATIVE   Leukocytes, UA NEGATIVE NEGATIVE  Urine microscopic-add on     Status: Abnormal   Collection Time: 03/22/16  6:25 PM  Result Value Ref Range   Squamous Epithelial / LPF 0-5 (A) NONE SEEN   WBC, UA 0-5 0 - 5 WBC/hpf   RBC / HPF 0-5 0 - 5 RBC/hpf   Bacteria, UA FEW (A) NONE SEEN  Urine rapid drug screen (hosp performed)     Status: None   Collection Time: 03/22/16  6:25 PM  Result Value Ref Range   Opiates NONE DETECTED NONE DETECTED   Cocaine NONE DETECTED NONE DETECTED   Benzodiazepines NONE DETECTED NONE DETECTED   Amphetamines NONE DETECTED NONE DETECTED   Tetrahydrocannabinol NONE DETECTED NONE DETECTED   Barbiturates NONE DETECTED NONE DETECTED    Comment:        DRUG SCREEN FOR MEDICAL PURPOSES ONLY.  IF CONFIRMATION IS NEEDED FOR ANY PURPOSE, NOTIFY LAB WITHIN 5 DAYS.        LOWEST DETECTABLE LIMITS FOR URINE DRUG SCREEN Drug Class       Cutoff (ng/mL) Amphetamine      1000 Barbiturate      200 Benzodiazepine   563 Tricyclics       875 Opiates          300 Cocaine          300 THC              50   Comprehensive metabolic panel     Status: Abnormal   Collection Time: 03/22/16  6:40 PM  Result Value Ref Range   Sodium 136 135 - 145  mmol/L   Potassium 3.6 3.5 - 5.1 mmol/L   Chloride 106 101 - 111 mmol/L   CO2 24 22 - 32 mmol/L   Glucose, Bld 196 (H) 65 - 99 mg/dL   BUN 8 6 - 20 mg/dL   Creatinine,  Ser 0.97 0.61 - 1.24 mg/dL   Calcium 9.3 8.9 - 10.3 mg/dL   Total Protein 6.3 (L) 6.5 - 8.1 g/dL   Albumin 3.5 3.5 - 5.0 g/dL   AST 16 15 - 41 U/L   ALT 13 (L) 17 - 63 U/L   Alkaline Phosphatase 77 38 - 126 U/L   Total Bilirubin 0.7 0.3 - 1.2 mg/dL   GFR calc non Af Amer >60 >60 mL/min   GFR calc Af Amer >60 >60 mL/min    Comment: (NOTE) The eGFR has been calculated using the CKD EPI equation. This calculation has not been validated in all clinical situations. eGFR's persistently <60 mL/min signify possible Chronic Kidney Disease.    Anion gap 6 5 - 15  CBC with Differential     Status: None   Collection Time: 03/22/16  6:40 PM  Result Value Ref Range   WBC 5.9 4.0 - 10.5 K/uL   RBC 4.48 4.22 - 5.81 MIL/uL   Hemoglobin 13.5 13.0 - 17.0 g/dL   HCT 40.8 39.0 - 52.0 %   MCV 91.1 78.0 - 100.0 fL   MCH 30.1 26.0 - 34.0 pg   MCHC 33.1 30.0 - 36.0 g/dL   RDW 12.6 11.5 - 15.5 %   Platelets 173 150 - 400 K/uL   Neutrophils Relative % 59 %   Neutro Abs 3.5 1.7 - 7.7 K/uL   Lymphocytes Relative 31 %   Lymphs Abs 1.8 0.7 - 4.0 K/uL   Monocytes Relative 7 %   Monocytes Absolute 0.4 0.1 - 1.0 K/uL   Eosinophils Relative 2 %   Eosinophils Absolute 0.1 0.0 - 0.7 K/uL   Basophils Relative 1 %   Basophils Absolute 0.0 0.0 - 0.1 K/uL  Brain natriuretic peptide     Status: Abnormal   Collection Time: 03/22/16  6:40 PM  Result Value Ref Range   B Natriuretic Peptide 482.9 (H) 0.0 - 100.0 pg/mL  I-stat troponin, ED     Status: None   Collection Time: 03/22/16  6:42 PM  Result Value Ref Range   Troponin i, poc 0.00 0.00 - 0.08 ng/mL   Comment 3            Comment: Due to the release kinetics of cTnI, a negative result within the first hours of the onset of symptoms does not rule out myocardial infarction with  certainty. If myocardial infarction is still suspected, repeat the test at appropriate intervals.   Lipase, blood     Status: None   Collection Time: 03/22/16  9:38 PM  Result Value Ref Range   Lipase 16 11 - 51 U/L  Protime-INR     Status: None   Collection Time: 03/22/16  9:38 PM  Result Value Ref Range   Prothrombin Time 14.1 11.4 - 15.2 seconds   INR 1.09   Troponin I (q 6hr x 3)     Status: Abnormal   Collection Time: 03/22/16  9:38 PM  Result Value Ref Range   Troponin I 0.03 (HH) <0.03 ng/mL    Comment: CRITICAL RESULT CALLED TO, READ BACK BY AND VERIFIED WITH: [facility] B,RN 03/22/16 2206 WAYK   Glucose, capillary     Status: Abnormal   Collection Time: 03/22/16 10:41 PM  Result Value Ref Range   Glucose-Capillary 144 (H) 65 - 99 mg/dL   Comment 1 Notify RN    Comment 2 Document in Chart    Dg Chest 2 View  Result Date: 03/22/2016 CLINICAL DATA:  82 year old male with chest pain. Initial encounter. EXAM: CHEST  2 VIEW COMPARISON:  02/09/2016 and earlier. FINDINGS: Calcified aortic atherosclerosis. Stable cardiomegaly and mediastinal contours. Stable visible cervical ACDF hardware. Stable cholecystectomy clips. Stable somewhat low lung volumes. Mildly increased pulmonary vascularity but no overt edema. No pneumothorax, pleural effusion or confluent pulmonary opacity. Small chronic calcified granuloma at the left lung base. No acute osseous abnormality identified. Negative visible bowel gas pattern. IMPRESSION: Stable cardiomegaly and calcified aortic atherosclerosis. Mildly increased pulmonary vascularity but no overt edema or other acute cardiopulmonary abnormality. Electronically Signed   By: Genevie Ann M.D.   On: 03/22/2016 19:15   Mr Brain Wo Contrast  Result Date: 03/23/2016 CLINICAL DATA:  Initial evaluation for acute dizziness. EXAM: MRI HEAD WITHOUT CONTRAST TECHNIQUE: Multiplanar, multiecho pulse sequences of the brain and surrounding structures were obtained without  intravenous contrast. COMPARISON:  Prior CT from 02/11/2016. FINDINGS: Study limited as only axial diffusion, T2 and FLAIR sequences, with sagittal T1 weighted sequences performed. Patient refused to continue. Axial diffusion-weighted sequences demonstrate a 4 mm focus of restricted diffusion within the anterior right centrum semi ovale, compatible with a small acute ischemic infarct. Finding likely reflects a small small vessel type infarct. No other abnormal foci of restricted diffusion identified. Diffuse prominence of the CSF containing spaces compatible with generalized cerebral atrophy. Patchy and confluent T2/FLAIR hyperintensity present within the periventricular and deep white matter, most compatible with chronic microvascular ischemic disease, moderate in nature. Chronic microvascular ischemic changes present within the pons. Normal intracranial vascular flow voids are maintained. Evaluation for hemorrhage limited due to lack of gradient echo sequence. No mass lesion, midline shift, or mass effect. No hydrocephalus. No extra-axial fluid collection. Major dural sinuses are grossly patent. Craniocervical junction within normal limits. Visualized upper cervical spine unremarkable without significant stenosis or other acute abnormality. Pituitary gland within normal limits. No acute abnormality about the globes and orbits. Patient is status post lens extraction bilaterally. Scattered mucosal thickening within the ethmoidal air cells. Paranasal sinuses are otherwise clear. Trace opacity within the inferior left mastoid air cells. Right mastoid air cells clear. Inner ear structures grossly normal. Bone marrow signal intensity within normal limits. No scalp soft tissue abnormality. IMPRESSION: 1. 4 mm acute ischemic infarct within the anterior right centrum semi ovale, most likely due to acute small vessel disease. 2. No other acute intracranial process. 3. Age-related cerebral atrophy with moderate chronic  microvascular ischemic disease. Electronically Signed   By: Jeannine Boga M.D.   On: 03/23/2016 00:54    Assessment: 82 y.o. male with multiple risk factors for stroke with small acute right ischemic infarction involving the centrum semiovale.  Stroke Risk Factors - diabetes mellitus, hyperlipidemia and hypertension  Plan: 1. HgbA1c, fasting lipid panel 2. MRA  of the brain without contrast 3. PT consult, OT consult, Speech consult 4. Echocardiogram 5. Carotid dopplers 6. Prophylactic therapy-Antiplatelet med: Plavix  7. Risk factor modification 8. Telemetry monitoring  C.R. Nicole Kindred, MD [facility] [phone]  03/23/2016, 2:01 AM

## 2016-03-23 NOTE — Progress Notes (Signed)
Pt will be transferred to 5M15 (neuro), pt showed ischemic infarct  Per MRI, Dr. Clyde Lundborg ordered to transfer pt to neurology floor. Report given to Dolly Rias RN.

## 2016-03-24 ENCOUNTER — Other Ambulatory Visit (HOSPITAL_COMMUNITY): Payer: Medicare Other

## 2016-03-24 DIAGNOSIS — I639 Cerebral infarction, unspecified: Secondary | ICD-10-CM

## 2016-03-24 DIAGNOSIS — I480 Paroxysmal atrial fibrillation: Secondary | ICD-10-CM

## 2016-03-24 LAB — BASIC METABOLIC PANEL
ANION GAP: 13 (ref 5–15)
BUN: 10 mg/dL (ref 6–20)
CALCIUM: 8.9 mg/dL (ref 8.9–10.3)
CO2: 29 mmol/L (ref 22–32)
Chloride: 96 mmol/L — ABNORMAL LOW (ref 101–111)
Creatinine, Ser: 1.05 mg/dL (ref 0.61–1.24)
Glucose, Bld: 154 mg/dL — ABNORMAL HIGH (ref 65–99)
Potassium: 3.1 mmol/L — ABNORMAL LOW (ref 3.5–5.1)
Sodium: 138 mmol/L (ref 135–145)

## 2016-03-24 LAB — CBC WITH DIFFERENTIAL/PLATELET
BASOS ABS: 0 10*3/uL (ref 0.0–0.1)
BASOS PCT: 0 %
EOS PCT: 2 %
Eosinophils Absolute: 0.1 10*3/uL (ref 0.0–0.7)
HCT: 41.4 % (ref 39.0–52.0)
Hemoglobin: 13.6 g/dL (ref 13.0–17.0)
Lymphocytes Relative: 32 %
Lymphs Abs: 1.6 10*3/uL (ref 0.7–4.0)
MCH: 29.4 pg (ref 26.0–34.0)
MCHC: 32.9 g/dL (ref 30.0–36.0)
MCV: 89.4 fL (ref 78.0–100.0)
MONO ABS: 0.4 10*3/uL (ref 0.1–1.0)
Monocytes Relative: 8 %
NEUTROS ABS: 2.8 10*3/uL (ref 1.7–7.7)
Neutrophils Relative %: 58 %
PLATELETS: 165 10*3/uL (ref 150–400)
RBC: 4.63 MIL/uL (ref 4.22–5.81)
RDW: 12.7 % (ref 11.5–15.5)
WBC: 4.9 10*3/uL (ref 4.0–10.5)

## 2016-03-24 LAB — GLUCOSE, CAPILLARY
GLUCOSE-CAPILLARY: 155 mg/dL — AB (ref 65–99)
Glucose-Capillary: 204 mg/dL — ABNORMAL HIGH (ref 65–99)
Glucose-Capillary: 174 mg/dL — ABNORMAL HIGH (ref 65–99)
Glucose-Capillary: 194 mg/dL — ABNORMAL HIGH (ref 65–99)

## 2016-03-24 LAB — VAS US CAROTID
LCCADSYS: -86 cm/s
LCCAPSYS: 80 cm/s
LEFT ECA DIAS: -10 cm/s
LEFT VERTEBRAL DIAS: 20 cm/s
Left CCA dist dias: -15 cm/s
Left CCA prox dias: 18 cm/s
Left ICA dist dias: -26 cm/s
Left ICA dist sys: -101 cm/s
Left ICA prox dias: 20 cm/s
Left ICA prox sys: 68 cm/s
RCCADSYS: -96 cm/s
RIGHT ECA DIAS: -9 cm/s
RIGHT VERTEBRAL DIAS: 11 cm/s
Right CCA prox dias: 11 cm/s
Right CCA prox sys: 113 cm/s

## 2016-03-24 LAB — HEMOGLOBIN A1C
HEMOGLOBIN A1C: 5.5 % (ref 4.8–5.6)
MEAN PLASMA GLUCOSE: 111 mg/dL

## 2016-03-24 MED ORDER — POTASSIUM CHLORIDE CRYS ER 20 MEQ PO TBCR
20.0000 meq | EXTENDED_RELEASE_TABLET | Freq: Every day | ORAL | Status: DC
  Administered 2016-03-24 – 2016-03-26 (×3): 20 meq via ORAL
  Filled 2016-03-24 (×3): qty 1

## 2016-03-24 MED ORDER — APIXABAN 5 MG PO TABS
5.0000 mg | ORAL_TABLET | Freq: Two times a day (BID) | ORAL | Status: DC
  Administered 2016-03-24 – 2016-03-26 (×5): 5 mg via ORAL
  Filled 2016-03-24 (×5): qty 1

## 2016-03-24 MED ORDER — POTASSIUM CHLORIDE CRYS ER 20 MEQ PO TBCR
40.0000 meq | EXTENDED_RELEASE_TABLET | Freq: Once | ORAL | Status: AC
Start: 2016-03-24 — End: 2016-03-24
  Administered 2016-03-24: 40 meq via ORAL
  Filled 2016-03-24: qty 2

## 2016-03-24 NOTE — Discharge Instructions (Signed)

## 2016-03-24 NOTE — Progress Notes (Signed)
Occupational Therapy Treatment Patient Details Name: Bobby Krueger MRN: [id number] DOB: 1926/12/11 Today's Date: 03/24/2016    History of present illness 82 yo male admitted with dizziness and MRI(+_) R centrum semiovale infarct. PMH:   OT comments  Pts progress toward OT goals limited by dizziness with positional changes. SpO2=90% on RA following limited activity; 2L supplemental O2 reapplied. BP=111/60 in sitting following activity. Pt reports dizziness resolving at end of session but now c/o headache; RN notified. Pt able to complete washing face in sitting with set up and functional mobility in room with min assist due to balance deficits. D/c plan remains appropriate. Will continue to follow acutely.   Follow Up Recommendations  SNF;Supervision/Assistance - 24 hour    Equipment Recommendations  3 in 1 bedside comode;Other (comment) (defer to SNF)    Recommendations for Other Services      Precautions / Restrictions Precautions Precautions: Fall Restrictions Weight Bearing Restrictions: No       Mobility Bed Mobility Overal bed mobility: Needs Assistance Bed Mobility: Supine to Sit   Sidelying to sit: Supervision       General bed mobility comments: Supervision for safety; no physcial assist required. HOB flat without use of bed rails.  Transfers Overall transfer level: Needs assistance Equipment used: Straight cane Transfers: Sit to/from Stand Sit to Stand: Min guard         General transfer comment: Min guard for safety. Pt c/o dizziness with positional changes.    Balance Overall balance assessment: Needs assistance Sitting-balance support: Feet supported;No upper extremity supported Sitting balance-Leahy Scale: Good     Standing balance support: Single extremity supported;During functional activity Standing balance-Leahy Scale: Poor                     ADL Overall ADL's : Needs assistance/impaired     Grooming: Set up;Sitting;Wash/dry  face           Upper Body Dressing : Set up;Min guard;Standing Upper Body Dressing Details (indicate cue type and reason): for hospital gown     Toilet Transfer: Minimal assistance;Ambulation [facility] Toilet Transfer Details (indicate cue type and reason): Simulated by sit to stand from EOB and functional mobility in room         Functional mobility during ADLs: Minimal assistance;Cane General ADL Comments: Pt c/o dizziness with supine to sit; resolved within ~2 min. Again, c/o dizziness with sit to stand-pt noticibly wobbly on feet. SpO2 down to 90 on RA following activity, BP 111/60. Pt reports dizziness resolved but c/o headache at end of session; RN notified.        Vision                     Perception     Praxis      Cognition   Behavior During Therapy: WFL for tasks assessed/performed Overall Cognitive Status: No family/caregiver present to determine baseline cognitive functioning                       Extremity/Trunk Assessment               Exercises     Shoulder Instructions       General Comments      Pertinent Vitals/ Pain       Pain Assessment: Faces Faces Pain Scale: Hurts little more Pain Location: head at end of session Pain Descriptors / Indicators: Sore Pain Intervention(s): Limited activity within patient's tolerance;Monitored during session;Repositioned  Home Living                                          Prior Functioning/Environment              Frequency Min 2X/week     Progress Toward Goals  OT Goals(current goals can now be found in the care plan section)  Progress towards OT goals: Not progressing toward goals - comment (dizziness in standing)  Acute Rehab OT Goals Patient Stated Goal: sit up for a little OT Goal Formulation: With patient  Plan Discharge plan remains appropriate    Co-evaluation                 End of Session Equipment Utilized During Treatment: Gait  belt;Oxygen;Other (comment) [facility])   Activity Tolerance Treatment limited secondary to medical complications (Comment);Other (comment) (dizziness)   Patient Left in chair;with call bell/phone within reach;with chair alarm set   Nurse Communication Mobility status;Other (comment) (dizziness, pt requesting to sit up in chair)        Time: 0626-9485 OT Time Calculation (min): 17 min  Charges: OT General Charges $OT Visit: 1 Procedure OT Treatments $Self Care/Home Management : 8-22 mins  Gaye Alken M.S., OTR/L Pager: [phone]  03/24/2016, 11:50 AM

## 2016-03-24 NOTE — Progress Notes (Signed)
Patient Name: Essie ChristineBilly E Buechler Date of Encounter: 03/24/2016     Principal Problem:   Acute ischemic stroke [facility](HCC) Active Problems:   CAD (coronary artery disease)   COPD (chronic obstructive pulmonary disease) (HCC)   GERD (gastroesophageal reflux disease)   Atrial fibrillation (HCC)   DM (diabetes mellitus), type 2 with neurological complications (HCC)   Pain in the chest   Dizziness   Abdominal pain   Chronic combined systolic (congestive) and diastolic (congestive) heart failure (HCC)   Essential hypertension   Acute CVA (cerebrovascular accident) (HCC)    SUBJECTIVE  No complaints. Feeling okay. No more chest pain or dizziness. No LE edema, orthopnea or PND.   CURRENT MEDS . amLODipine  5 mg Oral Daily  . atorvastatin  20 mg Oral q1800  . brimonidine  1 drop Both Eyes Daily  . carvedilol  6.25 mg Oral BID WC  . dextromethorphan-guaiFENesin  1 tablet Oral BID  . furosemide  40 mg Oral Daily  . gabapentin  300 mg Oral QHS  . hydrochlorothiazide  12.5 mg Oral Daily  . insulin aspart  0-9 Units Subcutaneous TID WC  . insulin glargine  7 Units Subcutaneous QHS  . latanoprost  1 drop Both Eyes QHS  . mometasone-formoterol  2 puff Inhalation BID  . omega-3 acid ethyl esters  1 g Oral Daily  . pantoprazole  40 mg Oral Daily  . terazosin  5 mg Oral QPM  . tiotropium  18 mcg Inhalation Daily    OBJECTIVE  Vitals:   03/23/16 2046 03/23/16 2112 03/24/16 0206 03/24/16 0549  BP:  137/73 117/63 114/63  Pulse:  71 79 70  Resp:  20 20 20   Temp:  98.2 F (36.8 C) 97.9 F (36.6 C) 98.2 F (36.8 C)  TempSrc:  Oral Oral Oral  SpO2: 96% 100% 97% 98%  Weight:      Height:        Intake/Output Summary (Last 24 hours) at 03/24/16 0839 Last data filed at 03/24/16 0550  Gross per 24 hour  Intake              480 ml  Output             1525 ml  Net            -1045 ml   Filed Weights   03/22/16 2232 03/23/16 0225  Weight: 195 lb 11.2 oz (88.8 kg) 200 lb 8 oz (90.9 kg)     PHYSICAL EXAM  General: Pleasant, NAD. Neuro: Alert and oriented X 3. Moves all extremities spontaneously. Psych: Normal affect. HEENT:  Normal  Neck: Supple without bruits or JVD. Lungs:  Resp regular and unlabored, CTA. Heart: RRR no s3, s4, or murmurs. Abdomen: Soft, non-tender, non-distended, BS + x 4.  Extremities: No clubbing, cyanosis or edema. DP/PT/Radials 2+ and equal bilaterally.  Accessory Clinical Findings  CBC  Recent Labs  03/22/16 1840 03/24/16 0557  WBC 5.9 4.9  NEUTROABS 3.5 2.8  HGB 13.5 13.6  HCT 40.8 41.4  MCV 91.1 89.4  PLT 173 165   Basic Metabolic Panel  Recent Labs  03/22/16 1840 03/24/16 0557  NA 136 138  K 3.6 3.1*  CL 106 96*  CO2 24 29  GLUCOSE 196* 154*  BUN 8 10  CREATININE 0.97 1.05  CALCIUM 9.3 8.9   Liver Function Tests  Recent Labs  03/22/16 1840  AST 16  ALT 13*  ALKPHOS 77  BILITOT 0.7  PROT 6.3*  ALBUMIN 3.5    Recent Labs  03/22/16 2138  LIPASE 16   Cardiac Enzymes  Recent Labs  03/22/16 2138 03/23/16 0504 03/23/16 0856  TROPONINI 0.03* 0.04* 0.03*   BNP Invalid input(s): POCBNP D-Dimer No results for input(s): DDIMER in the last 72 hours. Hemoglobin A1C  Recent Labs  03/23/16 0525  HGBA1C 5.5   Fasting Lipid Panel  Recent Labs  03/23/16 0504  CHOL 111  HDL 37*  LDLCALC 52  TRIG 161  CHOLHDL 3.0   Thyroid Function Tests No results for input(s): TSH, T4TOTAL, T3FREE, THYROIDAB in the last 72 hours.  Invalid input(s): FREET3  TELE  sinus with freq PVCs  Radiology/Studies  Dg Chest 2 View  Result Date: 03/22/2016 CLINICAL DATA:  82 year old male with chest pain. Initial encounter. EXAM: CHEST  2 VIEW COMPARISON:  02/09/2016 and earlier. FINDINGS: Calcified aortic atherosclerosis. Stable cardiomegaly and mediastinal contours. Stable visible cervical ACDF hardware. Stable cholecystectomy clips. Stable somewhat low lung volumes. Mildly increased pulmonary vascularity but no  overt edema. No pneumothorax, pleural effusion or confluent pulmonary opacity. Small chronic calcified granuloma at the left lung base. No acute osseous abnormality identified. Negative visible bowel gas pattern. IMPRESSION: Stable cardiomegaly and calcified aortic atherosclerosis. Mildly increased pulmonary vascularity but no overt edema or other acute cardiopulmonary abnormality. Electronically Signed   By: Odessa Fleming M.D.   On: 03/22/2016 19:15   Mr Brain Wo Contrast  Result Date: 03/23/2016 CLINICAL DATA:  Initial evaluation for acute dizziness. EXAM: MRI HEAD WITHOUT CONTRAST TECHNIQUE: Multiplanar, multiecho pulse sequences of the brain and surrounding structures were obtained without intravenous contrast. COMPARISON:  Prior CT from 02/11/2016. FINDINGS: Study limited as only axial diffusion, T2 and FLAIR sequences, with sagittal T1 weighted sequences performed. Patient refused to continue. Axial diffusion-weighted sequences demonstrate a 4 mm focus of restricted diffusion within the anterior right centrum semi ovale, compatible with a small acute ischemic infarct. Finding likely reflects a small small vessel type infarct. No other abnormal foci of restricted diffusion identified. Diffuse prominence of the CSF containing spaces compatible with generalized cerebral atrophy. Patchy and confluent T2/FLAIR hyperintensity present within the periventricular and deep white matter, most compatible with chronic microvascular ischemic disease, moderate in nature. Chronic microvascular ischemic changes present within the pons. Normal intracranial vascular flow voids are maintained. Evaluation for hemorrhage limited due to lack of gradient echo sequence. No mass lesion, midline shift, or mass effect. No hydrocephalus. No extra-axial fluid collection. Major dural sinuses are grossly patent. Craniocervical junction within normal limits. Visualized upper cervical spine unremarkable without significant stenosis or other acute  abnormality. Pituitary gland within normal limits. No acute abnormality about the globes and orbits. Patient is status post lens extraction bilaterally. Scattered mucosal thickening within the ethmoidal air cells. Paranasal sinuses are otherwise clear. Trace opacity within the inferior left mastoid air cells. Right mastoid air cells clear. Inner ear structures grossly normal. Bone marrow signal intensity within normal limits. No scalp soft tissue abnormality. IMPRESSION: 1. 4 mm acute ischemic infarct within the anterior right centrum semi ovale, most likely due to acute small vessel disease. 2. No other acute intracranial process. 3. Age-related cerebral atrophy with moderate chronic microvascular ischemic disease. Electronically Signed   By: Rise Mu M.D.   On: 03/23/2016 00:54   Ct Abdomen Pelvis W Contrast  Result Date: 03/23/2016 CLINICAL DATA:  82 year old patient with epigastric pain for several months. Denies nausea vomiting. History of cholecystectomy. EXAM: CT ABDOMEN AND PELVIS WITH CONTRAST TECHNIQUE: Multidetector CT imaging  of the abdomen and pelvis was performed using the standard protocol following bolus administration of intravenous contrast. CONTRAST:  ISOVUE-300 IOPAMIDOL (ISOVUE-300) INJECTION 61% COMPARISON:  CT abdomen and pelvis with contrast 12/21/2015 FINDINGS: The heart is enlarged. Scattered coronary artery calcification noted. Two benign densely calcified granulomas left lower lobe, stable. Negative for consolidation or pleural effusion at the lung bases. Cholecystectomy. Negative for biliary ductal dilatation. Liver size normal. Negative for hepatic mass. The portal vein is patent. Normal spleen, adrenal glands, and pancreas. Symmetric bilateral cortical thinning of both kidneys, stable. Negative for hydronephrosis. No visible renal stones. Mild nonspecific perinephric stranding is unchanged compared to prior CT. Ureters normal in caliber bilaterally. The stomach is  normally positioned and partially filled with oral contrast. Gastric wall appears normal in thickness. Distal esophagus appears normal. The duodenum is normal in caliber and shows no inflammatory change or wall thickening. Small bowel loops are normal in caliber. Terminal ileum unremarkable. Normal appendix. Normal caliber colon. Mild scattered diverticulosis. No colonic wall thickening. Normal appearance of the rectum. Prostamegaly. Prostate gland 6 cm transverse diameter. Slight bladder wall thickening appears stable, and may reflect changes of chronic bladder outlet obstruction. Bilateral fat containing inguinal hernias. Abdominal aorta normal in caliber and contains moderate atherosclerosis. Moderate atherosclerosis of the bilateral iliac vasculature. The bilateral renal arteries appear patent. There are 2 right and 2 left renal arteries. The mesenteric vessels are patent. Negative for lymphadenopathy or ascites. Stable appearance of the visualized lower thoracic and the lumbar spine. Multiple prominent anterior osteophytes in the visualized thoracic spine. Stable home angioma at L2 vertebral body. Prominent Schmorl stenosis and L2 and L3. Partial fusion of L4 and L5 vertebral bodies and disc desiccation and calcification at L5-S1. Extensive facet joint degenerative change and hypertrophy in the lower lumbar spine. Postsurgical changes of the lumbar spine. IMPRESSION: 1. No acute findings are identified in the abdomen or pelvis. Specifically, no explanation for epigastric pain identified. No significant change identified compared to recent CT abdomen/pelvis of June 2017. 2. Cholecystectomy. 3. Mild colonic diverticulosis without acute inflammatory change. 4. Prostamegaly and possible probable chronic bladder wall thickening likely related to chronic bladder outlet obstruction. 5. Coronary artery and aortoiliac atherosclerosis. 6. Chronic degenerative changes and postsurgical changes of the lumbar spine.  Electronically Signed   By: Britta Mccreedy M.D.   On: 03/23/2016 09:56    ASSESSMENT AND PLAN SHADY AKE is a 82 y.o. male with past medical history of PAF (initially diagnosed in 11/2015, started on Eliquis), chronic combined systolic and diastolic CHF, known LBBB, Type 2 DM, HTN, and HLD who presented to [facility] ED on 03/22/2016 for progressive dizziness and chest pain. He was found to be in atrial fibrillation and have acute CVA and cardiology consulted.   Chest pain: evidence of CAD on his CT scan. His symptoms are c/w chronic stable angina. Echo ordered this admission to reassess LVEF, but not yet completed. If unchanged, would not pursue further ischemic testing at this time. Continue current meds. He has been restarted on Eliquis 5mg  BID, so plavix should be discontinued.   Atrial fib, paroxysmal: now maintaining sinus with freq PVCs. Currently receiving Coreg 6.25mg  BID. This was stopped during a previous hospitalization due to bradycardia. HR currently in the 60's - 70's. Continue to monitor on telemetry. He was restarted on Eliquis for elevated CHADSVASC of 8 this AM per pharmacy consult.   Cardiomyopathy/Chronic systolic CHF: repeat 2D ECHO pending. Last echo in 11/2015 showed EF 40-45%. Volume  status is ok. Continue current meds with lasix  daily and coreg 6.25mg  BID. Would resume home Losartan if EF still low on repeat echo. Not sure why he is on both HCTZ and lasix, but would stop HCTZ.  Acute CVA: Neurology following. High risk of recurrent CVA with atrial fib off of Eliquis (he had not been taking for at least 2 months). Eliquis  BID has been restarted by pharmacy. Stop plavix.   Hypokalemia: likely 2/2 to HCTZ and lasix. Would stop HCTZ and replete K  Signed, Cline Crock PA-C  Pager [phone]   I have personally seen and examined this patient with Cline Crock, PA-C. I agree with the assessment and plan as outlined above. He is known to have a cardiomyopathy  (presumed to be non-ischemic) and PAF and has been followed by Dr. Lady Gary in [place], [place]. He was supposed to start Eliquis in May 2017 during an admission at [facility] but he never started this. Admitted here with dizziness and found to have acute CVA.  Neurology is following and recommends Eliquis. He was in atrial fibrillation at the time of admission. He is in sinus today. He has coronary calcification noted on CT scan. He has noted chest pain before admission, occurring several days per week for the last two years. He has been managed conservatively as an outpatient due to advanced age. My exam shows an elderly male in NAD. CV: regular with ectopy. Lungs: clear bilaterally. Ext: no edema. Abd: soft, NT Labs reviewed by me.  Echo today. If LV function overall unchanged, then no further cardiac workup. Start Eliquis. Stop Plavix. Follow up with Dr. Lady Gary in [place].   Verne Carrow 03/24/2016 11:30 AM

## 2016-03-24 NOTE — Progress Notes (Addendum)
PROGRESS NOTE    Bobby Krueger  ZOX:[id number] DOB: [birth year] DOA: 03/22/2016 PCP: [name]., MD      Brief Narrative:  Bobby Krueger is a 82 y.o. male with medical history significant of hypertension, hyperlipidemia, diabetes mellitus, COPD, GERD, chronic combined systolic and diastolic CHF (EF 81-19%), atrial fibrillation (not compliant to Eliquis), who presented with chest pain, and worsening dizziness. MRI of the brain subsequently showed small acute ischemic infarct of the right centrum semiovale. NIHSS on admission was zero.   Assessment & Plan:   Principal Problem:   Acute ischemic stroke [facility]) Active Problems:   CAD (coronary artery disease)   COPD (chronic obstructive pulmonary disease) (HCC)   GERD (gastroesophageal reflux disease)   Atrial fibrillation (HCC)   DM (diabetes mellitus), type 2 with neurological complications (HCC)   Pain in the chest   Dizziness   Abdominal pain   Chronic combined systolic (congestive) and diastolic (congestive) heart failure (HCC)   Essential hypertension   Acute CVA (cerebrovascular accident) (HCC)  Acute right centrum semiovale ischemic infarct: tPA held due to improvement in symptoms.  - Neurology following  - PT/OT recommend SNF, process underway, FL2 signed today  - Echo pending  - Carotid dopplers pending  - Restart eliquis, reinforced need for adherence  - Aspirin allergy noted.  Atypical chest pain:  - Nitro prn for CP  - Troponin's stable at 0.03 inconsistent with ACS   Hyperlipidemia: Chronic. Takes simvastatin at home, though this can increase risk of rhabdomyolysis when coadministered with norvasc at doses above 20mg . No symptoms of rhabdo.  - Permanently change to atorvastatin.   Chronic A Fib: CHADSVASc 7. Followed by Dr. Lady Gary in [place], [place]. Currently NSR.  - Restart eliquis and continue beta blocker. - Cardiology consulted, recommend no further work up for this or chest pain if echo (pending) shows  unchanged/improved EF.   Chronic combined systolic and diastolic heart failure: Previous Echo 11/2015: EF 40-45%, grade I diastolic heart dysfunction. Presumed due to nonischemic cardiomyopathy.  - Echo pending  - Continue coreg, lasix; holding cozaar as this ?caused cough.   Epigastric abdominal pain: Resolved. CT abd/pelvis negative. Lipase normal - PPI  Essential HTN: Chronic. Resistant vs. worsened by noncompliance.  - Hold cozaar due to cough - Continue home coreg, norvasc, HCTZ, lasix  DM type 2, insulin dependent, with neuropathy: Hb A1c 5.5%? Largely at inpatient goal.  - Lantus 7u qhs  - SSI  - Neurontin   COPD, well controlled  - Continue dulera, spiriva, duonebs prn  BPH - Terazosin, monitor for obstruction  GERD - PPI  DVT prophylaxis: Subcutaneous heparin Code Status: Full Family Communication: Declined offer to call family today, "maybe tomorrow."  Disposition Plan: further tests pending. Discharge timing pending tests, consulting services    Consultants:   Neurology/Stroke, Dr. Pearlean Brownie  Cardiology, Dr. Clifton James  Procedures:  None  Antimicrobials:   None    Subjective: Patient without complaints, eating breakfast. No active chest pain, dyspnea, or focal neurological symptoms. Pt in agreement that SNF is safest disposition for him. Son lives near, not with, him.   Objective: Vitals:   03/23/16 2112 03/24/16 0206 03/24/16 0549 03/24/16 0917  BP: 137/73 117/63 114/63 131/68  Pulse: 71 79 70 68  Resp: 20 20 20 18   Temp: 98.2 F (36.8 C) 97.9 F (36.6 C) 98.2 F (36.8 C) 97.8 F (36.6 C)  TempSrc: Oral Oral Oral Oral  SpO2: 100% 97% 98% 99%  Weight:  Height:        Intake/Output Summary (Last 24 hours) at 03/24/16 1722 Last data filed at 03/24/16 0550  Gross per 24 hour  Intake              480 ml  Output              300 ml  Net              180 ml   Filed Weights   03/22/16 2232 03/23/16 0225  Weight: 88.8 kg (195 lb 11.2 oz)  90.9 kg (200 lb 8 oz)    Examination:  General exam: Appears calm and comfortable  Respiratory system: Clear to auscultation. Respiratory effort normal. Cardiovascular system: S1 & S2 heard, regular rhythm. No JVD, murmurs, rubs, gallops or clicks. No pedal edema. Gastrointestinal system: Abdomen is nondistended, and nontender. No organomegaly or masses felt. Normal bowel sounds heard. Central nervous system: Alert and oriented. No focal neurological deficits. Extremities: Symmetric 5 x 5 strength Skin: No rashes, lesions or ulcers Psychiatry: Judgement and insight appear normal. Mood & affect appropriate.   Data Reviewed: I have personally reviewed following labs and imaging studies  CBC:  Recent Labs Lab 03/22/16 1840 03/24/16 0557  WBC 5.9 4.9  NEUTROABS 3.5 2.8  HGB 13.5 13.6  HCT 40.8 41.4  MCV 91.1 89.4  PLT 173 165   Basic Metabolic Panel:  Recent Labs Lab 03/22/16 1840 03/24/16 0557  NA 136 138  K 3.6 3.1*  CL 106 96*  CO2 24 29  GLUCOSE 196* 154*  BUN 8 10  CREATININE 0.97 1.05  CALCIUM 9.3 8.9   GFR: Estimated Creatinine Clearance: 52.3 mL/min (by C-G formula based on SCr of 1.05 mg/dL). Liver Function Tests:  Recent Labs Lab 03/22/16 1840  AST 16  ALT 13*  ALKPHOS 77  BILITOT 0.7  PROT 6.3*  ALBUMIN 3.5    Recent Labs Lab 03/22/16 2138  LIPASE 16   No results for input(s): AMMONIA in the last 168 hours. Coagulation Profile:  Recent Labs Lab 03/22/16 2138  INR 1.09   Cardiac Enzymes:  Recent Labs Lab 03/22/16 2138 03/23/16 0504 03/23/16 0856  TROPONINI 0.03* 0.04* 0.03*   BNP (last 3 results) No results for input(s): PROBNP in the last 8760 hours. HbA1C:  Recent Labs  03/23/16 0525  HGBA1C 5.5   CBG:  Recent Labs Lab 03/23/16 1655 03/23/16 2104 03/24/16 0622 03/24/16 1144 03/24/16 1625  GLUCAP 167* 202* 155* 204* 174*   Lipid Profile:  Recent Labs  03/23/16 0504  CHOL 111  HDL 37*  LDLCALC 52  TRIG  108  CHOLHDL 3.0   Thyroid Function Tests: No results for input(s): TSH, T4TOTAL, FREET4, T3FREE, THYROIDAB in the last 72 hours. Anemia Panel: No results for input(s): VITAMINB12, FOLATE, FERRITIN, TIBC, IRON, RETICCTPCT in the last 72 hours. Sepsis Labs: No results for input(s): PROCALCITON, LATICACIDVEN in the last 168 hours.  No results found for this or any previous visit (from the past 240 hour(s)).       Radiology Studies: Dg Chest 2 View  Result Date: 03/22/2016 CLINICAL DATA:  82 year old male with chest pain. Initial encounter. EXAM: CHEST  2 VIEW COMPARISON:  02/09/2016 and earlier. FINDINGS: Calcified aortic atherosclerosis. Stable cardiomegaly and mediastinal contours. Stable visible cervical ACDF hardware. Stable cholecystectomy clips. Stable somewhat low lung volumes. Mildly increased pulmonary vascularity but no overt edema. No pneumothorax, pleural effusion or confluent pulmonary opacity. Small chronic calcified granuloma at the left lung  base. No acute osseous abnormality identified. Negative visible bowel gas pattern. IMPRESSION: Stable cardiomegaly and calcified aortic atherosclerosis. Mildly increased pulmonary vascularity but no overt edema or other acute cardiopulmonary abnormality. Electronically Signed   By: Odessa FlemingH  Hall M.D.   On: 03/22/2016 19:15   Mr Brain Wo Contrast  Result Date: 03/23/2016 CLINICAL DATA:  Initial evaluation for acute dizziness. EXAM: MRI HEAD WITHOUT CONTRAST TECHNIQUE: Multiplanar, multiecho pulse sequences of the brain and surrounding structures were obtained without intravenous contrast. COMPARISON:  Prior CT from 02/11/2016. FINDINGS: Study limited as only axial diffusion, T2 and FLAIR sequences, with sagittal T1 weighted sequences performed. Patient refused to continue. Axial diffusion-weighted sequences demonstrate a 4 mm focus of restricted diffusion within the anterior right centrum semi ovale, compatible with a small acute ischemic infarct.  Finding likely reflects a small small vessel type infarct. No other abnormal foci of restricted diffusion identified. Diffuse prominence of the CSF containing spaces compatible with generalized cerebral atrophy. Patchy and confluent T2/FLAIR hyperintensity present within the periventricular and deep white matter, most compatible with chronic microvascular ischemic disease, moderate in nature. Chronic microvascular ischemic changes present within the pons. Normal intracranial vascular flow voids are maintained. Evaluation for hemorrhage limited due to lack of gradient echo sequence. No mass lesion, midline shift, or mass effect. No hydrocephalus. No extra-axial fluid collection. Major dural sinuses are grossly patent. Craniocervical junction within normal limits. Visualized upper cervical spine unremarkable without significant stenosis or other acute abnormality. Pituitary gland within normal limits. No acute abnormality about the globes and orbits. Patient is status post lens extraction bilaterally. Scattered mucosal thickening within the ethmoidal air cells. Paranasal sinuses are otherwise clear. Trace opacity within the inferior left mastoid air cells. Right mastoid air cells clear. Inner ear structures grossly normal. Bone marrow signal intensity within normal limits. No scalp soft tissue abnormality. IMPRESSION: 1. 4 mm acute ischemic infarct within the anterior right centrum semi ovale, most likely due to acute small vessel disease. 2. No other acute intracranial process. 3. Age-related cerebral atrophy with moderate chronic microvascular ischemic disease. Electronically Signed   By: Rise MuBenjamin  McClintock M.D.   On: 03/23/2016 00:54   Ct Abdomen Pelvis W Contrast  Result Date: 03/23/2016 CLINICAL DATA:  82 year old patient with epigastric pain for several months. Denies nausea vomiting. History of cholecystectomy. EXAM: CT ABDOMEN AND PELVIS WITH CONTRAST TECHNIQUE: Multidetector CT imaging of the abdomen and  pelvis was performed using the standard protocol following bolus administration of intravenous contrast. CONTRAST:  100mL ISOVUE-300 IOPAMIDOL (ISOVUE-300) INJECTION 61% COMPARISON:  CT abdomen and pelvis with contrast 12/21/2015 FINDINGS: The heart is enlarged. Scattered coronary artery calcification noted. Two benign densely calcified granulomas left lower lobe, stable. Negative for consolidation or pleural effusion at the lung bases. Cholecystectomy. Negative for biliary ductal dilatation. Liver size normal. Negative for hepatic mass. The portal vein is patent. Normal spleen, adrenal glands, and pancreas. Symmetric bilateral cortical thinning of both kidneys, stable. Negative for hydronephrosis. No visible renal stones. Mild nonspecific perinephric stranding is unchanged compared to prior CT. Ureters normal in caliber bilaterally. The stomach is normally positioned and partially filled with oral contrast. Gastric wall appears normal in thickness. Distal esophagus appears normal. The duodenum is normal in caliber and shows no inflammatory change or wall thickening. Small bowel loops are normal in caliber. Terminal ileum unremarkable. Normal appendix. Normal caliber colon. Mild scattered diverticulosis. No colonic wall thickening. Normal appearance of the rectum. Prostamegaly. Prostate gland 6 cm transverse diameter. Slight bladder wall thickening appears stable,  and may reflect changes of chronic bladder outlet obstruction. Bilateral fat containing inguinal hernias. Abdominal aorta normal in caliber and contains moderate atherosclerosis. Moderate atherosclerosis of the bilateral iliac vasculature. The bilateral renal arteries appear patent. There are 2 right and 2 left renal arteries. The mesenteric vessels are patent. Negative for lymphadenopathy or ascites. Stable appearance of the visualized lower thoracic and the lumbar spine. Multiple prominent anterior osteophytes in the visualized thoracic spine. Stable  home angioma at L2 vertebral body. Prominent Schmorl stenosis and L2 and L3. Partial fusion of L4 and L5 vertebral bodies and disc desiccation and calcification at L5-S1. Extensive facet joint degenerative change and hypertrophy in the lower lumbar spine. Postsurgical changes of the lumbar spine. IMPRESSION: 1. No acute findings are identified in the abdomen or pelvis. Specifically, no explanation for epigastric pain identified. No significant change identified compared to recent CT abdomen/pelvis of June 2017. 2. Cholecystectomy. 3. Mild colonic diverticulosis without acute inflammatory change. 4. Prostamegaly and possible probable chronic bladder wall thickening likely related to chronic bladder outlet obstruction. 5. Coronary artery and aortoiliac atherosclerosis. 6. Chronic degenerative changes and postsurgical changes of the lumbar spine. Electronically Signed   By: Britta Mccreedy M.D.   On: 03/23/2016 09:56   Scheduled Meds: . amLODipine  5 mg Oral Daily  . apixaban  5 mg Oral BID  . atorvastatin  20 mg Oral q1800  . brimonidine  1 drop Both Eyes Daily  . carvedilol  6.25 mg Oral BID WC  . dextromethorphan-guaiFENesin  1 tablet Oral BID  . furosemide  40 mg Oral Daily  . gabapentin  300 mg Oral QHS  . insulin aspart  0-9 Units Subcutaneous TID WC  . insulin glargine  7 Units Subcutaneous QHS  . latanoprost  1 drop Both Eyes QHS  . mometasone-formoterol  2 puff Inhalation BID  . omega-3 acid ethyl esters  1 g Oral Daily  . pantoprazole  40 mg Oral Daily  . potassium chloride  20 mEq Oral Daily  . terazosin  5 mg Oral QPM  . tiotropium  18 mcg Inhalation Daily   Continuous Infusions:    LOS: 1 day   Time spent: 25 minutes  Hazeline Junker , MD [facility] Pager [phone]  If 7PM-7AM, please contact night-coverage [email or web address] Password [email or web address] 03/24/2016, 5:22 PM

## 2016-03-24 NOTE — Progress Notes (Signed)
STROKE TEAM PROGRESS NOTE   HISTORY OF PRESENT ILLNESS (per record) Bobby Krueger is an 82 y.o. male history of hypertension, hypercholesterolemia, diabetes mellitus, COPD, heart failure and atrial fibrillation, who presented earlier with complaint of an episode of dizziness, more severe than usual. There was no associated diplopia nor focal weakness. There was no change in speech. Symptoms lasted for about one hour then resolved. Patient has been taking Plavix daily. He was prescribed Eliquis in May of this year but has not been taking this medication. MRI of his brain showed a small right acute ischemic infarction in the centrum semiovale region. NIH stroke score at the time of this evaluation was 0.  LSN: 9 AM on 03/22/2016 tPA Given: No: Symptoms resolved; no deficits on exam. mRankin:.    SUBJECTIVE (INTERVAL HISTORY) No family members present. The patient states he is doing well. No complaints   OBJECTIVE Temp:  [97.8 F (36.6 C)-98.2 F (36.8 C)] 97.8 F (36.6 C) (09/06 0917) Pulse Rate:  [68-79] 68 (09/06 0917) Cardiac Rhythm: Bundle branch block;Heart block (09/06 0700) Resp:  [18-20] 18 (09/06 0917) BP: (114-137)/(63-73) 131/68 (09/06 0917) SpO2:  [96 %-100 %] 99 % (09/06 0917)  CBC:   Recent Labs Lab 03/22/16 1840 03/24/16 0557  WBC 5.9 4.9  NEUTROABS 3.5 2.8  HGB 13.5 13.6  HCT 40.8 41.4  MCV 91.1 89.4  PLT 173 165    Basic Metabolic Panel:   Recent Labs Lab 03/22/16 1840 03/24/16 0557  NA 136 138  K 3.6 3.1*  CL 106 96*  CO2 24 29  GLUCOSE 196* 154*  BUN 8 10  CREATININE 0.97 1.05  CALCIUM 9.3 8.9    Lipid Panel:     Component Value Date/Time   CHOL 111 03/23/2016 0504   CHOL 89 06/06/2013 0637   TRIG 108 03/23/2016 0504   TRIG 95 06/06/2013 0637   HDL 37 (L) 03/23/2016 0504   HDL 37 (L) 06/06/2013 0637   CHOLHDL 3.0 03/23/2016 0504   VLDL 22 03/23/2016 0504   VLDL 19 06/06/2013 0637   LDLCALC 52 03/23/2016 0504   LDLCALC 33  06/06/2013 0637   HgbA1c:  Lab Results  Component Value Date   HGBA1C 5.5 03/23/2016   Urine Drug Screen:     Component Value Date/Time   LABOPIA NONE DETECTED 03/22/2016 1825   COCAINSCRNUR NONE DETECTED 03/22/2016 1825   COCAINSCRNUR NONE DETECTED 02/09/2016 2304   LABBENZ NONE DETECTED 03/22/2016 1825   AMPHETMU NONE DETECTED 03/22/2016 1825   THCU NONE DETECTED 03/22/2016 1825   LABBARB NONE DETECTED 03/22/2016 1825      IMAGING   Dg Chest 2 View 03/22/2016 Stable cardiomegaly and calcified aortic atherosclerosis. Mildly increased pulmonary vascularity but no overt edema or other acute cardiopulmonary abnormality.   Mr Brain Wo Contrast 03/23/2016 1. 4 mm acute ischemic infarct within the anterior right centrum semi ovale, most likely due to acute small vessel disease.  2. No other acute intracranial process.  3. Age-related cerebral atrophy with moderate chronic microvascular ischemic disease.     PHYSICAL EXAM Pleasant elderly Caucasian male currently not in distress. . Afebrile. Head is nontraumatic. Neck is supple without bruit.    Cardiac exam no murmur or gallop. Lungs are clear to auscultation. Distal pulses are well felt.  Neurological Exam ;  Awake  Alert oriented x 3. Normal speech and language.eye movements full without nystagmus.fundi were not visualized. Vision acuity and fields appear normal. Hearing is normal. Palatal movements are normal.  Face symmetric. Tongue midline. Normal strength, tone, reflexes and coordination. Normal sensation. Gait deferred.      ASSESSMENT/PLAN Mr. Bobby Krueger is a 82 y.o. male with history of congestive heart failure, hypertension, hyperlipidemia, diabetes mellitus, COPD, and atrial fibrillation (not anticoagulated) presenting with dizziness. He did not receive IV t-PA due to resolution of deficits.  Stroke:  Non-dominant infarct secondary to small vessel disease clinically silent  Resultant  No deficits  MRI - 4 mm  acute ischemic infarct within the anterior right centrum semi ovale  MRA - not performed  Carotid Doppler Bilateral: No significant (1-39%) ICA stenosis. Antegrade vertebral flow.   2D Echo pending  LDL -  52  HgbA1c 5.5  VTE prophylaxis - subcutaneous heparin Diet heart healthy/carb modified Room service appropriate? Yes; Fluid consistency: Thin  clopidogrel 75 mg daily prior to admission, now on clopidogrel 75 mg daily  Patient counseled to be compliant with his antithrombotic medications  Ongoing aggressive stroke risk factor management  Therapy recommendations:  snf Disposition: snf Hypertension  Stable  Permissive hypertension (OK if < 220/120) but gradually normalize in 5-7 days  Long-term BP goal normotensive  Hyperlipidemia  Home meds:  Zocor 40 mg daily resumed in hospital  LDL 52, goal < 70  Continue statin at discharge  Diabetes  HgbA1c 5.5 goal < 7.0  Uncontrolled  Other Stroke Risk Factors  Advanced age  Cigarette smoker - the patient quit smoking.  Obesity, Body mass index is 32.36 kg/m., recommend weight loss, diet and exercise as appropriate    Other Active Problems  Aspirin allergy  Hospital day # 1    I have personally examined this patient, reviewed notes, independently viewed imaging studies, participated in medical decision making and plan of care.ROS completed by me personally and pertinent positives fully documented  I have made any additions or clarifications directly to the above note. Agree with note above. . Patient has a history of atrial fibrillation but has not been compliant with taking his eliquis. He presented with dizziness episode and MRI scan shows a tiny right white matter infarct likely asymptomatic. Patient remains at risk for recurrent stroke and TIAs given history of rectal fibrillation. Patient counseled to be compliant with his medications and take eliquis given his high CHAD2Vasc score of 8 . Agree with cardiology  recommendation of starting eliquis 5 mg twice daily.  Stroke team will sign off. Kindly call for questions. Discussed with Dr. Belia Hemanyan Grunz [name], MD Medical Director [facility] Pager: [phone] 03/24/2016 4:23 PM     To contact Stroke Continuity provider, please refer to [email or web address]. After hours, contact General Neurology

## 2016-03-24 NOTE — Progress Notes (Deleted)
OT Cancellation Note  Patient Details Name: Bobby Krueger MRN: [id number] DOB: 1926-09-01   Cancelled Treatment:       Gaye Alken 03/24/2016, 11:50 AM

## 2016-03-25 ENCOUNTER — Inpatient Hospital Stay (HOSPITAL_COMMUNITY): Payer: Medicare Other

## 2016-03-25 DIAGNOSIS — I6789 Other cerebrovascular disease: Secondary | ICD-10-CM

## 2016-03-25 DIAGNOSIS — I25119 Atherosclerotic heart disease of native coronary artery with unspecified angina pectoris: Secondary | ICD-10-CM

## 2016-03-25 LAB — TROPONIN I: Troponin I: <0.03 ng/mL (ref <0.03)

## 2016-03-25 LAB — ECHOCARDIOGRAM COMPLETE
Height: 66 in
Weight: 3208 oz

## 2016-03-25 LAB — BASIC METABOLIC PANEL
Anion gap: 8 (ref 5–15)
BUN: 11 mg/dL (ref 6–20)
CALCIUM: 9.6 mg/dL (ref 8.9–10.3)
CHLORIDE: 99 mmol/L — AB (ref 101–111)
CO2: 31 mmol/L (ref 22–32)
CREATININE: 1.06 mg/dL (ref 0.61–1.24)
GFR calc non Af Amer: >60 mL/min (ref >60)
Glucose, Bld: 191 mg/dL — ABNORMAL HIGH (ref 65–99)
Potassium: 3.6 mmol/L (ref 3.5–5.1)
Sodium: 138 mmol/L (ref 135–145)

## 2016-03-25 LAB — GLUCOSE, CAPILLARY
GLUCOSE-CAPILLARY: 206 mg/dL — AB (ref 65–99)
GLUCOSE-CAPILLARY: 153 mg/dL — AB (ref 65–99)
GLUCOSE-CAPILLARY: 176 mg/dL — AB (ref 65–99)
Glucose-Capillary: 181 mg/dL — ABNORMAL HIGH (ref 65–99)

## 2016-03-25 LAB — MAGNESIUM: Magnesium: 2 mg/dL (ref 1.7–2.4)

## 2016-03-25 MED ORDER — GI COCKTAIL ~~LOC~~
30.0000 mL | Freq: Three times a day (TID) | ORAL | Status: DC | PRN
  Administered 2016-03-25: 30 mL via ORAL
  Filled 2016-03-25 (×2): qty 30

## 2016-03-25 MED ORDER — FUROSEMIDE 40 MG PO TABS
40.0000 mg | ORAL_TABLET | Freq: Once | ORAL | Status: AC
  Administered 2016-03-25: 40 mg via ORAL
  Filled 2016-03-25: qty 1

## 2016-03-25 MED ORDER — LOSARTAN POTASSIUM 50 MG PO TABS
50.0000 mg | ORAL_TABLET | Freq: Every day | ORAL | Status: DC
  Administered 2016-03-25: 50 mg via ORAL
  Filled 2016-03-25 (×2): qty 1

## 2016-03-25 NOTE — [place] FL2 (Signed)
[place] MEDICAID FL2 LEVEL OF CARE SCREENING TOOL     IDENTIFICATION  Patient Name: Bobby Krueger Birthdate: 1926-12-19 Sex: male Admission Date (Current Location): 03/22/2016  [facility] [place]   Facility and Address:  [facility], [address], [place], [place] [address]      Provider Number: [id number]  Attending Physician Name and Address:  [name], MD  Relative Name and Phone Number:       Current Level of Care: Hospital Recommended Level of Care: Skilled Nursing Facility Prior Approval Number:    Date Approved/Denied:   PASRR Number: [id number]  Discharge Plan: SNF    Current Diagnoses: Patient Active Problem List   Diagnosis Date Noted  . Cerebral thrombosis with cerebral infarction 03/23/2016  . Acute ischemic stroke (HCC) 03/23/2016  . Acute CVA (cerebrovascular accident) (HCC) 03/23/2016  . Pain in the chest 03/22/2016  . Dizziness 03/22/2016  . Abdominal pain 03/22/2016  . Essential hypertension 03/22/2016  . Chronic combined systolic (congestive) and diastolic (congestive) heart failure (HCC)   . 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 Bradycardia 02/10/2016  . Atrial fibrillation (HCC) 11/19/2015  . CAD (coronary artery disease) 11/16/2015  . COPD (chronic obstructive pulmonary disease) (HCC) 11/16/2015  . GERD (gastroesophageal reflux disease) 11/16/2015    Orientation RESPIRATION BLADDER Height & Weight     Self, Time, Situation, Place  Normal Continent Weight: 200 lb 8 oz (90.9 kg) Height:  5\' 6"  (167.6 cm)  BEHAVIORAL SYMPTOMS/MOOD NEUROLOGICAL BOWEL NUTRITION STATUS      Continent Diet (Heart Healthy, Carb Modified/Thin Liquids)  AMBULATORY STATUS COMMUNICATION OF NEEDS Skin   Limited Assist Verbally Normal                       Personal Care Assistance Level of Assistance  Bathing, Feeding, Dressing Bathing Assistance: Limited  assistance Feeding assistance: Independent Dressing Assistance: Limited assistance     Functional Limitations Info  Sight, Hearing, Speech Sight Info: Adequate Hearing Info: Adequate Speech Info: Adequate    SPECIAL CARE FACTORS FREQUENCY  PT (By licensed PT), OT (By licensed OT)     PT Frequency: 5 OT Frequency: 5            Contractures Contractures Info: Not present    Additional Factors Info  Code Status, Allergies, Insulin Sliding Scale Code Status Info: Full Code Allergies Info: Aldactone Spironolactone, Aspirin, Ciprofloxacin, Cyclobenzaprine, Lexapro Escitalopram   Insulin Sliding Scale Info: 4x/day       Current Medications (03/25/2016):  This is the current hospital active medication list Current Facility-Administered Medications  Medication Dose Route Frequency Provider Last Rate Last Dose  . acetaminophen (TYLENOL) tablet 650 mg  650 mg Oral Q4H PRN Lorretta HarpXilin Niu, MD      . apixaban Everlene Balls(ELIQUIS) tablet 5 mg  5 mg Oral BID Almon HerculesHaley P Baird, RPH   5 mg at 03/25/16 [phone]  . atorvastatin (LIPITOR) tablet 20 mg  20 mg Oral q1800 Almon HerculesHaley P Baird, RPH   20 mg at 03/24/16 1722  . brimonidine (ALPHAGAN) 0.2 % ophthalmic solution 1 drop  1 drop Both Eyes Daily Lorretta HarpXilin Niu, MD   1 drop at 03/25/16 0958  . carvedilol (COREG) tablet 6.25 mg  6.25 mg Oral BID WC Lorretta HarpXilin Niu, MD   6.25 mg at 03/25/16 0958  . dextromethorphan-guaiFENesin (MUCINEX DM) 30-600 MG per 12 hr tablet 1 tablet  1 tablet Oral BID  Lorretta Harp, MD   1 tablet at 03/25/16 [phone]  . furosemide (LASIX) tablet 40 mg  40 mg Oral Daily Lorretta Harp, MD   40 mg at 03/25/16 0958  . furosemide (LASIX) tablet 40 mg  40 mg Oral Once [name], MD      . gabapentin (NEURONTIN) capsule 300 mg  300 mg Oral QHS Lorretta Harp, MD   300 mg at 03/24/16 2159  . gi cocktail (Maalox,Lidocaine,Donnatal)  30 mL Oral TID PRN [name], MD   30 mL at 03/25/16 0958  . hydrOXYzine (VISTARIL) injection 25 mg  25 mg Intramuscular Q6H PRN Lorretta Harp, MD       . insulin aspart (novoLOG) injection 0-9 Units  0-9 Units Subcutaneous TID WC Lorretta Harp, MD   2 Units at 03/25/16 [phone]  . insulin glargine (LANTUS) injection 7 Units  7 Units Subcutaneous QHS Lorretta Harp, MD   7 Units at 03/24/16 2159  . ipratropium-albuterol (DUONEB) 0.5-2.5 (3) MG/3ML nebulizer solution 3 mL  3 mL Nebulization Q6H PRN Lorretta Harp, MD      . latanoprost (XALATAN) 0.005 % ophthalmic solution 1 drop  1 drop Both Eyes QHS Lorretta Harp, MD   1 drop at 03/23/16 2124  . mometasone-formoterol (DULERA) 200-5 MCG/ACT inhaler 2 puff  2 puff Inhalation BID Lorretta Harp, MD   2 puff at 03/25/16 0707  . nitroGLYCERIN (NITROSTAT) SL tablet 0.4 mg  0.4 mg Sublingual Q5 min PRN Lorretta Harp, MD      . omega-3 acid ethyl esters (LOVAZA) capsule 1 g  1 g Oral Daily Lorretta Harp, MD   1 g at 03/25/16 0958  . ondansetron (ZOFRAN) injection 4 mg  4 mg Intravenous Q6H PRN Carlton Adam Choi, DO      . pantoprazole (PROTONIX) EC tablet 40 mg  40 mg Oral Daily Lorretta Harp, MD   40 mg at 03/25/16 0958  . polyvinyl alcohol (LIQUIFILM TEARS) 1.4 % ophthalmic solution 1 drop  1 drop Both Eyes PRN Lorretta Harp, MD      . potassium chloride SA (K-DUR,KLOR-CON) CR tablet 20 mEq  20 mEq Oral Daily Janetta Hora, PA-C   20 mEq at 03/25/16 0958  . terazosin (HYTRIN) capsule 5 mg  5 mg Oral QPM Lorretta Harp, MD   5 mg at 03/24/16 1722  . tiotropium (SPIRIVA) inhalation capsule 18 mcg  18 mcg Inhalation Daily Lorretta Harp, MD   18 mcg at 03/25/16 0707  . triamcinolone cream (KENALOG) 0.1 % 1 application  1 application Topical BID PRN Lorretta Harp, MD         Discharge Medications: Please see discharge summary for a list of discharge medications.  Relevant Imaging Results:  Relevant Lab Results:   Additional Information SSN:  [id number]  Dede Query, LCSW

## 2016-03-25 NOTE — Progress Notes (Signed)
PROGRESS NOTE    Bobby ChristineBilly E Sing  YQM:[id number] DOB: 1926/09/10 DOA: 03/22/2016 PCP: Dalia HeadingFATH,KENNETH A., MD      Brief Narrative:  Bobby Krueger is a 82 y.o. male with medical history significant of hypertension, hyperlipidemia, diabetes mellitus, COPD, GERD, chronic combined systolic and diastolic CHF (EF 52-84%40-45%), atrial fibrillation (not compliant to Eliquis), who presented with chest pain, and worsening dizziness. MRI of the brain subsequently showed small acute ischemic infarct of the right centrum semiovale. NIHSS on admission was zero. Chest pain waxed and waned during hospitalization, and had dated back for 2 years. Troponins cycled, found to be <0.05, ECG showing preexisting LBBB. Cardiology was consulted and recommended no further work up if echocardiogram showed an overall unchanged LV function.   Assessment & Plan:   Principal Problem:   Acute ischemic stroke [facility](HCC) Active Problems:   CAD (coronary artery disease)   COPD (chronic obstructive pulmonary disease) (HCC)   GERD (gastroesophageal reflux disease)   Atrial fibrillation (HCC)   DM (diabetes mellitus), type 2 with neurological complications (HCC)   Pain in the chest   Dizziness   Abdominal pain   Chronic combined systolic (congestive) and diastolic (congestive) heart failure (HCC)   Essential hypertension   Acute CVA (cerebrovascular accident) (HCC)  Acute right centrum semiovale ischemic infarct: tPA held due to improvement in symptoms.  - Neurology following  - PT/OT recommend SNF: CSW efforts greatly appreciated.  - Echo pending  - Carotid dopplers pending  - Restart eliquis, reinforced need for adherence  - Aspirin allergy noted.  Atypical chest pain: Recurrent this morning per RN report, though this is epigastric "discomfort" after eating breakfast this morning without significant vital sign changes. LBBB on ECG unchanged from prior. Pt on anticoagulation.  - Nitro prn for CP (patient refused), O2 prn (no  documented hypoxemia) - Troponin's stable at 0.03 inconsistent with ACS; will check troponin again.  - ?indigestion: GI cocktail ordered  - RN to call to echo to hopefully expedite this.  Hyperlipidemia: Chronic. Takes simvastatin at home, though this can increase risk of rhabdomyolysis when coadministered with norvasc at doses above 20mg . No symptoms of rhabdo.  - Permanently change to atorvastatin.   Chronic A Fib: CHADSVASc 7. [place] by [place]. Lady GaryFath in [place], [place]. Currently NSR.  - Restart eliquis and continue beta blocker. - Cardiology consulted, recommend no further work up for this or chest pain if echo (pending) shows unchanged/improved EF.   Chronic combined systolic and diastolic heart failure: Previous Echo 11/2015: EF 40-45%, grade I diastolic heart dysfunction. Presumed due to nonischemic cardiomyopathy. BNP mildly elevated at admission, CXR showed stable cardiomegaly and increased vascularity.  - Crackles on exam today: Additional lasix x1 today. - Echo pending  - Continue coreg, lasix; holding cozaar as this ?caused cough.   GERD: Chronic, with epigastric pain. CT abd/pelvis negative. Lipase normal - PPI - GI cocktail as above.  Essential HTN: Chronic. Resistant vs. worsened by noncompliance.  - Hold cozaar due to cough - Continue home coreg, lasix - Pt normo/hypotensive so will hold norvasc, and HCTZ due to hypokalemia as well  DM type 2, insulin dependent, with neuropathy: Hb A1c 5.5%? Largely at inpatient goal.  - Lantus 7u qhs  - SSI  - Neurontin   COPD, well controlled  - Continue dulera, spiriva, duonebs prn  BPH - Terazosin, monitor for obstruction  DVT prophylaxis: Eliquis Code Status: Full Family Communication: Declined offer to call family today, "maybe tomorrow."  Disposition Plan: further tests pending  to r/o cardiac ischemia. Discharge timing pending tests, consulting services    Consultants:   Neurology/Stroke, Dr. Pearlean Brownie  Cardiology, Dr.  Clifton James  Procedures:  None  Antimicrobials:   None   Subjective: Patient complains of epigastric "discomfort," difficult to describe that occurred after breakfast this AM. Also dizzy, unchanged from admission. Denies chest pain, dyspnea, palpitations, N/V/D. Declined NTG.    Objective: Vitals:   03/24/16 2014 03/24/16 2115 03/25/16 0143 03/25/16 0522  BP:  133/63 129/60 (!) 121/54  Pulse:  70 72 69  Resp:  20 20 20   Temp:  98 F (36.7 C) 98.1 F (36.7 C) 98 F (36.7 C)  TempSrc:  Oral Oral Oral  SpO2: 99% 97% 98% 95%  Weight:      Height:        Intake/Output Summary (Last 24 hours) at 03/25/16 0831 Last data filed at 03/25/16 0746  Gross per 24 hour  Intake              840 ml  Output              650 ml  Net              190 ml   Filed Weights   03/22/16 2232 03/23/16 0225  Weight: 88.8 kg (195 lb 11.2 oz) 90.9 kg (200 lb 8 oz)    Examination:  General exam: Appears calm and comfortable  Respiratory system: Faint bibasilar crackles. Respiratory effort normal without wheezes. Cardiovascular system: S1 & S2 heard, regular rhythm. No JVD, murmurs, rubs, gallops or clicks. No pedal edema. Gastrointestinal system: Abdomen is nondistended, and nontender. No organomegaly or masses felt. Normal bowel sounds heard. Central nervous system: Alert and oriented. No focal neurological deficits. Extremities: Symmetric 5 x 5 strength Skin: No rashes, lesions or ulcers Psychiatry: Judgement and insight appear normal. Mood & affect appropriate.   Data Reviewed: I have personally reviewed following labs and imaging studies  CBC:  Recent Labs Lab 03/22/16 1840 03/24/16 0557  WBC 5.9 4.9  NEUTROABS 3.5 2.8  HGB 13.5 13.6  HCT 40.8 41.4  MCV 91.1 89.4  PLT 173 165   Basic Metabolic Panel:  Recent Labs Lab 03/22/16 1840 03/24/16 0557  NA 136 138  K 3.6 3.1*  CL 106 96*  CO2 24 29  GLUCOSE 196* 154*  BUN 8 10  CREATININE 0.97 1.05  CALCIUM 9.3 8.9    GFR: Estimated Creatinine Clearance: 52.3 mL/min (by C-G formula based on SCr of 1.05 mg/dL). Liver Function Tests:  Recent Labs Lab 03/22/16 1840  AST 16  ALT 13*  ALKPHOS 77  BILITOT 0.7  PROT 6.3*  ALBUMIN 3.5    Recent Labs Lab 03/22/16 2138  LIPASE 16   No results for input(s): AMMONIA in the last 168 hours. Coagulation Profile:  Recent Labs Lab 03/22/16 2138  INR 1.09   Cardiac Enzymes:  Recent Labs Lab 03/22/16 2138 03/23/16 0504 03/23/16 0856  TROPONINI 0.03* 0.04* 0.03*   BNP (last 3 results) No results for input(s): PROBNP in the last 8760 hours. HbA1C:  Recent Labs  03/23/16 0525  HGBA1C 5.5   CBG:  Recent Labs Lab 03/24/16 0622 03/24/16 1144 03/24/16 1625 03/24/16 2110 03/25/16 0613  GLUCAP 155* 204* 174* 194* 181*   Lipid Profile:  Recent Labs  03/23/16 0504  CHOL 111  HDL 37*  LDLCALC 52  TRIG 224  CHOLHDL 3.0   Thyroid Function Tests: No results for input(s): TSH, T4TOTAL, FREET4, T3FREE, THYROIDAB  in the last 72 hours. Anemia Panel: No results for input(s): VITAMINB12, FOLATE, FERRITIN, TIBC, IRON, RETICCTPCT in the last 72 hours. Sepsis Labs: No results for input(s): PROCALCITON, LATICACIDVEN in the last 168 hours.  No results found for this or any previous visit (from the past 240 hour(s)).   Radiology Studies: Ct Abdomen Pelvis W Contrast  Result Date: 03/23/2016 CLINICAL DATA:  82 year old patient with epigastric pain for several months. Denies nausea vomiting. History of cholecystectomy. EXAM: CT ABDOMEN AND PELVIS WITH CONTRAST TECHNIQUE: Multidetector CT imaging of the abdomen and pelvis was performed using the standard protocol following bolus administration of intravenous contrast. CONTRAST:  ISOVUE-300 IOPAMIDOL (ISOVUE-300) INJECTION 61% COMPARISON:  CT abdomen and pelvis with contrast 12/21/2015 FINDINGS: The heart is enlarged. Scattered coronary artery calcification noted. Two benign densely  calcified granulomas left lower lobe, stable. Negative for consolidation or pleural effusion at the lung bases. Cholecystectomy. Negative for biliary ductal dilatation. Liver size normal. Negative for hepatic mass. The portal vein is patent. Normal spleen, adrenal glands, and pancreas. Symmetric bilateral cortical thinning of both kidneys, stable. Negative for hydronephrosis. No visible renal stones. Mild nonspecific perinephric stranding is unchanged compared to prior CT. Ureters normal in caliber bilaterally. The stomach is normally positioned and partially filled with oral contrast. Gastric wall appears normal in thickness. Distal esophagus appears normal. The duodenum is normal in caliber and shows no inflammatory change or wall thickening. Small bowel loops are normal in caliber. Terminal ileum unremarkable. Normal appendix. Normal caliber colon. Mild scattered diverticulosis. No colonic wall thickening. Normal appearance of the rectum. Prostamegaly. Prostate gland 6 cm transverse diameter. Slight bladder wall thickening appears stable, and may reflect changes of chronic bladder outlet obstruction. Bilateral fat containing inguinal hernias. Abdominal aorta normal in caliber and contains moderate atherosclerosis. Moderate atherosclerosis of the bilateral iliac vasculature. The bilateral renal arteries appear patent. There are 2 right and 2 left renal arteries. The mesenteric vessels are patent. Negative for lymphadenopathy or ascites. Stable appearance of the visualized lower thoracic and the lumbar spine. Multiple prominent anterior osteophytes in the visualized thoracic spine. Stable home angioma at L2 vertebral body. Prominent Schmorl stenosis and L2 and L3. Partial fusion of L4 and L5 vertebral bodies and disc desiccation and calcification at L5-S1. Extensive facet joint degenerative change and hypertrophy in the lower lumbar spine. Postsurgical changes of the lumbar spine. IMPRESSION: 1. No acute findings  are identified in the abdomen or pelvis. Specifically, no explanation for epigastric pain identified. No significant change identified compared to recent CT abdomen/pelvis of June 2017. 2. Cholecystectomy. 3. Mild colonic diverticulosis without acute inflammatory change. 4. Prostamegaly and possible probable chronic bladder wall thickening likely related to chronic bladder outlet obstruction. 5. Coronary artery and aortoiliac atherosclerosis. 6. Chronic degenerative changes and postsurgical changes of the lumbar spine. Electronically Signed   By: Britta Mccreedy M.D.   On: 03/23/2016 09:56   Scheduled Meds: . amLODipine  5 mg Oral Daily  . apixaban  5 mg Oral BID  . atorvastatin  20 mg Oral q1800  . brimonidine  1 drop Both Eyes Daily  . carvedilol  6.25 mg Oral BID WC  . dextromethorphan-guaiFENesin  1 tablet Oral BID  . furosemide  40 mg Oral Daily  . gabapentin  300 mg Oral QHS  . insulin aspart  0-9 Units Subcutaneous TID WC  . insulin glargine  7 Units Subcutaneous QHS  . latanoprost  1 drop Both Eyes QHS  . mometasone-formoterol  2 puff Inhalation BID  .  omega-3 acid ethyl esters  1 g Oral Daily  . pantoprazole  40 mg Oral Daily  . potassium chloride  20 mEq Oral Daily  . terazosin  5 mg Oral QPM  . tiotropium  18 mcg Inhalation Daily   Continuous Infusions:    LOS: 2 days   Time spent: 25 minutes  Hazeline Junker , MD [facility] Pager [phone]  If 7PM-7AM, please contact night-coverage [email or web address] Password [email or web address] 03/25/2016, 8:31 AM

## 2016-03-25 NOTE — Progress Notes (Signed)
*  PRELIMINARY RESULTS* Echocardiogram 2D Echocardiogram has been performed.  Bobby Krueger 03/25/2016, 11:09 AM

## 2016-03-25 NOTE — Progress Notes (Signed)
Physical Therapy Treatment Patient Details Name: Essie ChristineBilly E Handlin MRN: [id number] DOB: 06-04-1927 Today's Date: 03/25/2016    History of Present Illness 82 yo male admitted with dizziness and MRI(+_) R centrum semiovale infarct. PMH:    PT Comments    Progressing steadily.  Mobility becoming more steady and now pt able to manage some challenges including scanning, speed changes, more abrupt turns and stepping over/around obstacles.   Follow Up Recommendations  SNF     Equipment Recommendations  None recommended by PT    Recommendations for Other Services       Precautions / Restrictions Precautions Precautions: Fall    Mobility  Bed Mobility               General bed mobility comments: up in the chair  Transfers Overall transfer level: Needs assistance Equipment used: Straight cane Transfers: Sit to/from Stand Sit to Stand: Supervision         General transfer comment: safe transfer technique  Ambulation/Gait Ambulation/Gait assistance: Supervision;Min guard Ambulation Distance (Feet): 200 Feet Assistive device: Straight cane Gait Pattern/deviations: Step-through pattern Gait velocity: slower, but able to increase speed to cuing Gait velocity interpretation: Below normal speed for age/gender General Gait Details: Initially gait was steady and smooth, but appeared more antalgic as he progressed and stayed closer for more safety..  Pt able to manage challenges including scanning, stepping over and around obstacles and changing speed without Overt LOB   Stairs            Wheelchair Mobility    Modified Rankin (Stroke Patients Only) Modified Rankin (Stroke Patients Only) Pre-Morbid Rankin Score: No symptoms Modified Rankin: Moderate disability     Balance Overall balance assessment: Needs assistance   Sitting balance-Leahy Scale: Good       Standing balance-Leahy Scale: Fair                      Cognition Arousal/Alertness:  Awake/alert Behavior During Therapy: WFL for tasks assessed/performed Overall Cognitive Status: No family/caregiver present to determine baseline cognitive functioning                      Exercises      General Comments        Pertinent Vitals/Pain Pain Assessment: Faces Faces Pain Scale: Hurts little more Pain Location: legs Pain Descriptors / Indicators: Sore Pain Intervention(s): Monitored during session;Limited activity within patient's tolerance    Home Living                      Prior Function            PT Goals (current goals can now be found in the care plan section) Acute Rehab PT Goals Patient Stated Goal: Love to go home PT Goal Formulation: With patient Time For Goal Achievement: 03/30/16 Potential to Achieve Goals: Good Progress towards PT goals: Progressing toward goals    Frequency  Min 3X/week    PT Plan Current plan remains appropriate    Co-evaluation             End of Session   Activity Tolerance: Patient tolerated treatment well Patient left: in chair;with call bell/phone within reach;with chair alarm set     Time: [id number] PT Time Calculation (min) (ACUTE ONLY): 15 min  Charges:  $Gait Training: 8-22 mins                    G Codes:  Rush Salce, Eliseo Gum 03/25/2016, 4:41 PM 03/25/2016  [place] Bing, PT [phone]  (pager)

## 2016-03-25 NOTE — Progress Notes (Signed)
Hospital Problem List     Principal Problem:   Acute ischemic stroke [facility]) Active Problems:   CAD (coronary artery disease)   COPD (chronic obstructive pulmonary disease) (HCC)   GERD (gastroesophageal reflux disease)   Atrial fibrillation (HCC)   DM (diabetes mellitus), type 2 with neurological complications (HCC)   Pain in the chest   Dizziness   Abdominal pain   Chronic combined systolic (congestive) and diastolic (congestive) heart failure (HCC)   Essential hypertension   Acute CVA (cerebrovascular accident) [facility])    Patient Profile:   Primary Cardiologist: Dr. Lady Gary - [place], [place]  82 y.o.male w/ PMH of PAF (initially diagnosed in 11/2015, started on Eliquis), chronic combined systolic and diastolic CHF, known LBBB,Type 2 DM, HTN, and HLD who presented to [facility] ED on 03/22/2016 for progressive dizziness and chest pain. He was found to be in atrial fibrillation and have acute CVA.   Subjective   Says he still has occasional episodes of CP, lasting for up to 1 hour then resolving spontaneously.  Seems confused about d/c plans. Says he is not sure if he is going home or to SNF. PT and OT both recommend SNF as the patient currently lives by himself.  Inpatient Medications    . apixaban  5 mg Oral BID  . atorvastatin  20 mg Oral q1800  . brimonidine  1 drop Both Eyes Daily  . carvedilol  6.25 mg Oral BID WC  . dextromethorphan-guaiFENesin  1 tablet Oral BID  . furosemide  40 mg Oral Daily  . furosemide  40 mg Oral Once  . gabapentin  300 mg Oral QHS  . insulin aspart  0-9 Units Subcutaneous TID WC  . insulin glargine  7 Units Subcutaneous QHS  . latanoprost  1 drop Both Eyes QHS  . mometasone-formoterol  2 puff Inhalation BID  . omega-3 acid ethyl esters  1 g Oral Daily  . pantoprazole  40 mg Oral Daily  . potassium chloride  20 mEq Oral Daily  . terazosin  5 mg Oral QPM  . tiotropium  18 mcg Inhalation Daily    Vital Signs    Vitals:   03/24/16 2014  03/24/16 2115 03/25/16 0143 03/25/16 0522  BP:  133/63 129/60 (!) 121/54  Pulse:  70 72 69  Resp:  20 20 20   Temp:  98 F (36.7 C) 98.1 F (36.7 C) 98 F (36.7 C)  TempSrc:  Oral Oral Oral  SpO2: 99% 97% 98% 95%  Weight:      Height:        Intake/Output Summary (Last 24 hours) at 03/25/16 0906 Last data filed at 03/25/16 0746  Gross per 24 hour  Intake              840 ml  Output              650 ml  Net              190 ml   Filed Weights   03/22/16 2232 03/23/16 0225  Weight: 195 lb 11.2 oz (88.8 kg) 200 lb 8 oz (90.9 kg)    Physical Exam    General: Pleasant elderly Caucasian male appearing in no acute distress. Head: Normocephalic, atraumatic.  Neck: Supple without bruits, JVD not elevated. Lungs:  Resp regular and unlabored, CTA without wheezing or rales. Heart: RRR, S1, S2, no S3, S4, or murmur; no rub. Abdomen: Soft, non-tender, non-distended with normoactive bowel sounds. No hepatomegaly. No  rebound/guarding. No obvious abdominal masses. Extremities: No clubbing, cyanosis, or edema. Distal pedal pulses are 2+ bilaterally. Neuro: Alert and oriented X 3. Moves all extremities spontaneously. Psych: Normal affect.  Labs    CBC  Recent Labs  03/22/16 1840 03/24/16 0557  WBC 5.9 4.9  NEUTROABS 3.5 2.8  HGB 13.5 13.6  HCT 40.8 41.4  MCV 91.1 89.4  PLT 173 165   Basic Metabolic Panel  Recent Labs  03/22/16 1840 03/24/16 0557  NA 136 138  K 3.6 3.1*  CL 106 96*  CO2 24 29  GLUCOSE 196* 154*  BUN 8 10  CREATININE 0.97 1.05  CALCIUM 9.3 8.9   Liver Function Tests  Recent Labs  03/22/16 1840  AST 16  ALT 13*  ALKPHOS 77  BILITOT 0.7  PROT 6.3*  ALBUMIN 3.5    Recent Labs  03/22/16 2138  LIPASE 16   Cardiac Enzymes  Recent Labs  03/22/16 2138 03/23/16 0504 03/23/16 0856  TROPONINI 0.03* 0.04* 0.03*   BNP Invalid input(s): POCBNP D-Dimer No results for input(s): DDIMER in the last 72 hours. Hemoglobin A1C  Recent Labs   03/23/16 0525  HGBA1C 5.5   Fasting Lipid Panel  Recent Labs  03/23/16 0504  CHOL 111  HDL 37*  LDLCALC 52  TRIG 119108  CHOLHDL 3.0   Thyroid Function Tests No results for input(s): TSH, T4TOTAL, T3FREE, THYROIDAB in the last 72 hours.  Invalid input(s): FREET3  Telemetry    NSR, HR in 60's - 80's. Frequent PVC's. Episodes of 3 beats NSVT.   ECG    NSR, HR 76, with LBBB. Frequent PVC's.   Cardiac Studies and Radiology    Dg Chest 2 View  Result Date: 03/22/2016 CLINICAL DATA:  82 year old male with chest pain. Initial encounter. EXAM: CHEST  2 VIEW COMPARISON:  02/09/2016 and earlier. FINDINGS: Calcified aortic atherosclerosis. Stable cardiomegaly and mediastinal contours. Stable visible cervical ACDF hardware. Stable cholecystectomy clips. Stable somewhat low lung volumes. Mildly increased pulmonary vascularity but no overt edema. No pneumothorax, pleural effusion or confluent pulmonary opacity. Small chronic calcified granuloma at the left lung base. No acute osseous abnormality identified. Negative visible bowel gas pattern. IMPRESSION: Stable cardiomegaly and calcified aortic atherosclerosis. Mildly increased pulmonary vascularity but no overt edema or other acute cardiopulmonary abnormality. Electronically Signed   By: Odessa FlemingH  Hall M.D.   On: 03/22/2016 19:15   Mr Brain Wo Contrast  Result Date: 03/23/2016 CLINICAL DATA:  Initial evaluation for acute dizziness. EXAM: MRI HEAD WITHOUT CONTRAST TECHNIQUE: Multiplanar, multiecho pulse sequences of the brain and surrounding structures were obtained without intravenous contrast. COMPARISON:  Prior CT from 02/11/2016. FINDINGS: Study limited as only axial diffusion, T2 and FLAIR sequences, with sagittal T1 weighted sequences performed. Patient refused to continue. Axial diffusion-weighted sequences demonstrate a 4 mm focus of restricted diffusion within the anterior right centrum semi ovale, compatible with a small acute ischemic infarct.  Finding likely reflects a small small vessel type infarct. No other abnormal foci of restricted diffusion identified. Diffuse prominence of the CSF containing spaces compatible with generalized cerebral atrophy. Patchy and confluent T2/FLAIR hyperintensity present within the periventricular and deep white matter, most compatible with chronic microvascular ischemic disease, moderate in nature. Chronic microvascular ischemic changes present within the pons. Normal intracranial vascular flow voids are maintained. Evaluation for hemorrhage limited due to lack of gradient echo sequence. No mass lesion, midline shift, or mass effect. No hydrocephalus. No extra-axial fluid collection. Major dural sinuses are grossly patent. Craniocervical junction  within normal limits. Visualized upper cervical spine unremarkable without significant stenosis or other acute abnormality. Pituitary gland within normal limits. No acute abnormality about the globes and orbits. Patient is status post lens extraction bilaterally. Scattered mucosal thickening within the ethmoidal air cells. Paranasal sinuses are otherwise clear. Trace opacity within the inferior left mastoid air cells. Right mastoid air cells clear. Inner ear structures grossly normal. Bone marrow signal intensity within normal limits. No scalp soft tissue abnormality. IMPRESSION: 1. 4 mm acute ischemic infarct within the anterior right centrum semi ovale, most likely due to acute small vessel disease. 2. No other acute intracranial process. 3. Age-related cerebral atrophy with moderate chronic microvascular ischemic disease. Electronically Signed   By: Rise Mu M.D.   On: 03/23/2016 00:54   Ct Abdomen Pelvis W Contrast  Result Date: 03/23/2016 CLINICAL DATA:  82 year old patient with epigastric pain for several months. Denies nausea vomiting. History of cholecystectomy. EXAM: CT ABDOMEN AND PELVIS WITH CONTRAST TECHNIQUE: Multidetector CT imaging of the abdomen and  pelvis was performed using the standard protocol following bolus administration of intravenous contrast. CONTRAST:  ISOVUE-300 IOPAMIDOL (ISOVUE-300) INJECTION 61% COMPARISON:  CT abdomen and pelvis with contrast 12/21/2015 FINDINGS: The heart is enlarged. Scattered coronary artery calcification noted. Two benign densely calcified granulomas left lower lobe, stable. Negative for consolidation or pleural effusion at the lung bases. Cholecystectomy. Negative for biliary ductal dilatation. Liver size normal. Negative for hepatic mass. The portal vein is patent. Normal spleen, adrenal glands, and pancreas. Symmetric bilateral cortical thinning of both kidneys, stable. Negative for hydronephrosis. No visible renal stones. Mild nonspecific perinephric stranding is unchanged compared to prior CT. Ureters normal in caliber bilaterally. The stomach is normally positioned and partially filled with oral contrast. Gastric wall appears normal in thickness. Distal esophagus appears normal. The duodenum is normal in caliber and shows no inflammatory change or wall thickening. Small bowel loops are normal in caliber. Terminal ileum unremarkable. Normal appendix. Normal caliber colon. Mild scattered diverticulosis. No colonic wall thickening. Normal appearance of the rectum. Prostamegaly. Prostate gland 6 cm transverse diameter. Slight bladder wall thickening appears stable, and may reflect changes of chronic bladder outlet obstruction. Bilateral fat containing inguinal hernias. Abdominal aorta normal in caliber and contains moderate atherosclerosis. Moderate atherosclerosis of the bilateral iliac vasculature. The bilateral renal arteries appear patent. There are 2 right and 2 left renal arteries. The mesenteric vessels are patent. Negative for lymphadenopathy or ascites. Stable appearance of the visualized lower thoracic and the lumbar spine. Multiple prominent anterior osteophytes in the visualized thoracic spine. Stable  home angioma at L2 vertebral body. Prominent Schmorl stenosis and L2 and L3. Partial fusion of L4 and L5 vertebral bodies and disc desiccation and calcification at L5-S1. Extensive facet joint degenerative change and hypertrophy in the lower lumbar spine. Postsurgical changes of the lumbar spine. IMPRESSION: 1. No acute findings are identified in the abdomen or pelvis. Specifically, no explanation for epigastric pain identified. No significant change identified compared to recent CT abdomen/pelvis of June 2017. 2. Cholecystectomy. 3. Mild colonic diverticulosis without acute inflammatory change. 4. Prostamegaly and possible probable chronic bladder wall thickening likely related to chronic bladder outlet obstruction. 5. Coronary artery and aortoiliac atherosclerosis. 6. Chronic degenerative changes and postsurgical changes of the lumbar spine. Electronically Signed   By: Britta Mccreedy M.D.   On: 03/23/2016 09:56    Echocardiogram: Pending  Assessment & Plan    1. Chest pain - evidence of CAD on his CT scan. His symptoms are  c/w chronic stable angina. Echo ordered this admission to reassess LVEF, but not yet completed. If unchanged, would not pursue further ischemic testing at this time. Called echo to verify he is on the schedule today.  - Continue BB and Statin. He has been restarted on Eliquis 5mg  BID, so no ASA or Plavix at this time. - still having episodes of CP, lasting up to 1 hour and occurring at rest. Partially relieved with GI cocktail.   2. Paroxysmal Atrial Fibrillation - now maintaining NSR with frequent PVCs.  - Currently receiving Coreg 6.25mg  BID. This was stopped during a previous hospitalization due to bradycardia. HR currently in the 60's - 70's. Continue to monitor on telemetry.  - He has been restarted on Eliquis for elevated CHADSVASC of 8.  3. Cardiomyopathy/Chronic systolic CHF - repeat 2D ECHO pending. Last echo in 11/2015 showed EF 40-45%. Volume status is ok.  -  Continue current meds with Lasix 40mg  daily and Coreg 6.25mg  BID. Would resume home Losartan if EF still low on repeat echo. Was on both HCTZ and Lasix PTA, but would stop HCTZ with need to be on Lasix.  4. Acute CVA - Neurology following. High risk of recurrent CVA with atrial fib off of Eliquis (he had not been taking for at least 2 months). Eliquis 5mg  BID has been restarted by pharmacy. Stop plavix.   5. Hypokalemia - K+ 3.1. Scheduled for replacement.   Lorri Frederick , PA-C 9:06 AM 03/25/2016 Pager: [phone]  I have personally seen and examined this patient with Randall An, PA-C. I agree with the assessment and plan as outlined above. He has chronic stable angina. He was admitted with a CVA. Now on Eliquis with PAF. If echo is unchanged today, can d/c home and f/u with Dr. Lady Gary in [place]. He may need outpatient stress testing. No plans for ischemic evaluation during this hospital setting given recent CVA.   Verne Carrow 03/25/2016 9:31 AM'

## 2016-03-26 DIAGNOSIS — R072 Precordial pain: Secondary | ICD-10-CM

## 2016-03-26 LAB — BASIC METABOLIC PANEL
ANION GAP: 8 (ref 5–15)
BUN: 16 mg/dL (ref 6–20)
CALCIUM: 9.4 mg/dL (ref 8.9–10.3)
CO2: 31 mmol/L (ref 22–32)
CREATININE: 1.23 mg/dL (ref 0.61–1.24)
Chloride: 99 mmol/L — ABNORMAL LOW (ref 101–111)
GFR, EST AFRICAN AMERICAN: 59 mL/min — AB (ref >60)
GFR, EST NON AFRICAN AMERICAN: 51 mL/min — AB (ref >60)
Glucose, Bld: 156 mg/dL — ABNORMAL HIGH (ref 65–99)
Potassium: 3.4 mmol/L — ABNORMAL LOW (ref 3.5–5.1)
SODIUM: 138 mmol/L (ref 135–145)

## 2016-03-26 LAB — GLUCOSE, CAPILLARY
GLUCOSE-CAPILLARY: 168 mg/dL — AB (ref 65–99)
Glucose-Capillary: 197 mg/dL — ABNORMAL HIGH (ref 65–99)
Glucose-Capillary: 196 mg/dL — ABNORMAL HIGH (ref 65–99)

## 2016-03-26 MED ORDER — LOSARTAN POTASSIUM 50 MG PO TABS: 25.0000 mg | ORAL_TABLET | Freq: Every day | ORAL | Status: DC

## 2016-03-26 MED ORDER — LOSARTAN POTASSIUM 25 MG PO TABS: 25.0000 mg | ORAL_TABLET | Freq: Every day | ORAL | Status: DC

## 2016-03-26 MED ORDER — ATORVASTATIN CALCIUM 20 MG PO TABS: 20.0000 mg | ORAL_TABLET | Freq: Every day | ORAL | Status: AC

## 2016-03-26 NOTE — Progress Notes (Signed)
MD called back and told writer to lower the bed of the bed and comtinue to monitor patient.

## 2016-03-26 NOTE — Discharge Summary (Signed)
Physician Discharge Summary  Bobby Krueger:[id number] DOB: 05-09-1927 DOA: 03/22/2016  PCP: [name]., MD  Admit date: 03/22/2016 Discharge date: 03/26/2016  Admitted From: Home Disposition: SNF   Recommendations for Outpatient Follow-up:  1. Follow up with PCP in 1-2 weeks 2. Monitor BP; doses of antihypertensives decreased due to hypotension without signs of sepsis. 3. Follow up with stroke clinic in 8 weeks. 4. Follow up with cardiology in the next 1 - 2 months.    Home Health: N/A Equipment/Devices: Per PT  Discharge Condition: Stable CODE STATUS: Full Diet recommendation: Carb modified, heart healthy  Brief/Interim Summary: Bobby E Overmanis a 82 y.o.malewith medical history significant of hypertension, hyperlipidemia, diabetes mellitus, COPD, GERD, chronic combined systolic and diastolic CHF (EF 81-19%), atrial fibrillation (not compliant to Eliquis), who presented with chest pain, and worsening dizziness. MRI of the brain subsequently showed small acute ischemic infarct of the right centrum semiovale. NIHSS on admission was zero. Chest pain waxed and waned during hospitalization, and had dated back for 2 years. Troponins cycled, found to be <0.05, ECG showing preexisting LBBB. Cardiology was consulted for decreased ejection fraction, though they felt this was stable from prior echocardiograms. He remained euvolemic. Physical therapy and occupational therapy was provided throughout hospitalization, and he showed improvement in endurance and balance, though he is still at very high risk for falling and readmission so he will require 24 hours supervision in addition to ongoing therapy at SNF.   Discharge Diagnoses:  Principal Problem:   Acute ischemic stroke [facility]) Active Problems:   CAD (coronary artery disease)   COPD (chronic obstructive pulmonary disease) (HCC)   GERD (gastroesophageal reflux disease)   Atrial fibrillation (HCC)   DM (diabetes mellitus), type 2 with  neurological complications (HCC)   Pain in the chest   Dizziness   Abdominal pain   Chronic combined systolic (congestive) and diastolic (congestive) heart failure (HCC)   Essential hypertension   Acute CVA (cerebrovascular accident) (HCC)  Acute right centrum semiovale ischemic infarct: tPA held due to improvement in symptoms.  - Neurology following  - PT/OT recommend SNF: CSW efforts greatly appreciated.  - Echo pending  - Carotid dopplers pending  - Restart eliquis, reinforced need for adherence and STOP aspirin - Start statin  Atypical chest pain: Recurrent this morning per RN report, though this is epigastric "discomfort" after eating breakfast this morning without significant vital sign changes. LBBB on ECG unchanged from prior. Pt on anticoagulation.  - Nitro prn for CP (patient refused), O2 prn (no documented hypoxemia) - Troponin's stable at 0.03 inconsistent with ACS; will check troponin again.  - ?indigestion: GI cocktail ordered  - RN to call to echo to hopefully expedite this.  Hyperlipidemia: Chronic. Takes simvastatin at home, though this can increase risk of rhabdomyolysis when coadministered with norvasc at doses above 20mg . No symptoms of rhabdo.  - Permanently change to atorvastatin.   Chronic A Fib: CHADSVASc 7. Followed by Dr. Lady Gary in [place], [place]. Currently NSR.  - Restart eliquis and continue beta blocker. - Cardiology consulted, recommend no further work up for this or chest pain  Chronic combined systolic and diastolic heart failure: Previous Echo 11/2015: EF 40-45%, grade I diastolic heart dysfunction. Presumed due to nonischemic cardiomyopathy. BNP mildly elevated at admission, CXR showed stable cardiomegaly and increased vascularity.  - Crackles on exam today: Additional lasix x1 today. - Echo read as 35-40% EF, thought to be relatively unchanged on cardiology assessment.  - Continue coreg, lasix; restarted cozaar at 25mg   dose (was on )  GERD:  Chronic, with epigastric pain. CT abd/pelvis negative. Lipase normal - PPI - GI cocktail as above.  Essential HTN: Chronic. Resistant vs. worsened by noncompliance.  - Continue home coreg, lasix - Pt normo/hypotensive so will hold norvasc, and HCTZ due to hypokalemia as well - Decrease BP meds per med rec.  DM type 2, insulin dependent, with neuropathy: Hb A1c 5.5%? Largely at inpatient goal.  - Lantus 7u qhs  - SSI  - Neurontin   COPD, well controlled  - Continue dulera, spiriva, duonebs prn  BPH - Terazosin, monitor for obstruction  Discharge Instructions Discharge Instructions    Diet - low sodium heart healthy    Complete by:  As directed   Discharge instructions    Complete by:  As directed   see discharge summary   Increase activity slowly    Complete by:  As directed       Medication List    STOP taking these medications   clopidogrel 75 MG tablet Commonly known as:  PLAVIX   hydrochlorothiazide 25 MG tablet Commonly known as:  HYDRODIURIL   simvastatin 40 MG tablet Commonly known as:  ZOCOR   zolpidem 10 MG tablet Commonly known as:  AMBIEN     TAKE these medications   ADVAIR DISKUS 250-50 MCG/DOSE Aepb Generic drug:  Fluticasone-Salmeterol Inhale 1 puff into the lungs daily as needed (for wheezing or shortness).   apixaban 5 MG Tabs tablet Commonly known as:  ELIQUIS Take 1 tablet (5 mg total) by mouth 2 (two) times daily.   atorvastatin 20 MG tablet Commonly known as:  LIPITOR Take 1 tablet (20 mg total) by mouth daily at 6 PM.   brimonidine 0.2 % ophthalmic solution Commonly known as:  ALPHAGAN Place 1 drop into both eyes daily.   carvedilol 6.25 MG tablet Commonly known as:  COREG Take 6.25 mg by mouth 2 (two) times daily with a meal.   furosemide 40 MG tablet Commonly known as:  LASIX Take 1 tablet (40 mg total) by mouth daily.   gabapentin 300 MG capsule Commonly known as:  NEURONTIN Take 300 mg by mouth at bedtime.    ibuprofen 400 MG tablet Commonly known as:  ADVIL,MOTRIN Take 400 mg by mouth every 6 (six) hours as needed for moderate pain.   insulin glargine 100 UNIT/ML injection Commonly known as:  LANTUS Inject 0.08 mLs (8 Units total) into the skin at bedtime. What changed:  how much to take   insulin lispro 100 UNIT/ML injection Commonly known as:  HUMALOG Inject 0-9 Units into the skin 3 (three) times daily before meals. CBG's 70-120 = 0 units; 121-150 =1 units; 151-200 = 2 units; 201-250 =3 units; 251-300 =5 units; 301-350 =7 units; 351-400=9 units and > 400 Notify provider.   ipratropium-albuterol 0.5-2.5 (3) MG/3ML Soln Commonly known as:  DUONEB Take 3 mLs by nebulization every 6 (six) hours as needed (for SOB).   ketorolac 0.5 % ophthalmic solution Commonly known as:  ACULAR Place 1 drop into the right eye 4 (four) times daily.   latanoprost 0.005 % ophthalmic solution Commonly known as:  XALATAN Place 1 drop into both eyes at bedtime.   losartan 25 MG tablet Commonly known as:  COZAAR Take 1 tablet (25 mg total) by mouth daily. Start taking on:  03/27/2016 What changed:  medication strength  how much to take   nateglinide 120 MG tablet Commonly known as:  STARLIX Take 120 mg by mouth  daily.   Omega-3 1000 MG Caps Take 1 g by mouth daily.   omeprazole 20 MG capsule Commonly known as:  PRILOSEC Take 20 mg by mouth daily.   polyvinyl alcohol 1.4 % ophthalmic solution Commonly known as:  LIQUIFILM TEARS Place 1 drop into both eyes as needed for dry eyes. Reported on 12/21/2015   potassium chloride SA 20 MEQ tablet Commonly known as:  K-DUR,KLOR-CON Take 20 mEq by mouth daily.   predniSONE 10 MG (21) Tbpk tablet Commonly known as:  STERAPRED UNI-PAK 21 TAB Take 1 tablet (10 mg total) by mouth daily. 40mg  x1 day, 20mg  x2 day, 10mg  x2 day, then stop   PROAIR HFA 108 (90 Base) MCG/ACT inhaler Generic drug:  albuterol Inhale 2 puffs into the lungs every 4 (four) hours  as needed for wheezing or shortness of breath.   promethazine 25 MG tablet Commonly known as:  PHENERGAN Take 12.5-25 mg by mouth every 4 (four) hours as needed.   SPIRIVA HANDIHALER 18 MCG inhalation capsule Generic drug:  tiotropium Place 1 application into inhaler and inhale daily.   terazosin 5 MG capsule Commonly known as:  HYTRIN Take 5 mg by mouth every evening.   triamcinolone cream 0.1 % Commonly known as:  KENALOG Apply 1 application topically 2 (two) times daily as needed (for irritation).   trimethoprim-polymyxin b ophthalmic solution Commonly known as:  POLYTRIM Place 2 drops into the right eye every 6 (six) hours.       Allergies  Allergen Reactions  . Aldactone [Spironolactone] Hives  . Aspirin Hives  . Ciprofloxacin Hives  . Cyclobenzaprine Hives  . Lexapro [Escitalopram] Hives    Consultations:  Cardiology, Dr. Clifton James  Stroke team, Dr. Pearlean Brownie  Procedures/Studies: Dg Chest 2 View  Result Date: 03/22/2016 CLINICAL DATA:  82 year old male with chest pain. Initial encounter. EXAM: CHEST  2 VIEW COMPARISON:  02/09/2016 and earlier. FINDINGS: Calcified aortic atherosclerosis. Stable cardiomegaly and mediastinal contours. Stable visible cervical ACDF hardware. Stable cholecystectomy clips. Stable somewhat low lung volumes. Mildly increased pulmonary vascularity but no overt edema. No pneumothorax, pleural effusion or confluent pulmonary opacity. Small chronic calcified granuloma at the left lung base. No acute osseous abnormality identified. Negative visible bowel gas pattern. IMPRESSION: Stable cardiomegaly and calcified aortic atherosclerosis. Mildly increased pulmonary vascularity but no overt edema or other acute cardiopulmonary abnormality. Electronically Signed   By: Odessa Fleming M.D.   On: 03/22/2016 19:15   Mr Brain Wo Contrast  Result Date: 03/23/2016 CLINICAL DATA:  Initial evaluation for acute dizziness. EXAM: MRI HEAD WITHOUT CONTRAST TECHNIQUE:  Multiplanar, multiecho pulse sequences of the brain and surrounding structures were obtained without intravenous contrast. COMPARISON:  Prior CT from 02/11/2016. FINDINGS: Study limited as only axial diffusion, T2 and FLAIR sequences, with sagittal T1 weighted sequences performed. Patient refused to continue. Axial diffusion-weighted sequences demonstrate a 4 mm focus of restricted diffusion within the anterior right centrum semi ovale, compatible with a small acute ischemic infarct. Finding likely reflects a small small vessel type infarct. No other abnormal foci of restricted diffusion identified. Diffuse prominence of the CSF containing spaces compatible with generalized cerebral atrophy. Patchy and confluent T2/FLAIR hyperintensity present within the periventricular and deep white matter, most compatible with chronic microvascular ischemic disease, moderate in nature. Chronic microvascular ischemic changes present within the pons. Normal intracranial vascular flow voids are maintained. Evaluation for hemorrhage limited due to lack of gradient echo sequence. No mass lesion, midline shift, or mass effect. No hydrocephalus. No extra-axial fluid collection.  Major dural sinuses are grossly patent. Craniocervical junction within normal limits. Visualized upper cervical spine unremarkable without significant stenosis or other acute abnormality. Pituitary gland within normal limits. No acute abnormality about the globes and orbits. Patient is status post lens extraction bilaterally. Scattered mucosal thickening within the ethmoidal air cells. Paranasal sinuses are otherwise clear. Trace opacity within the inferior left mastoid air cells. Right mastoid air cells clear. Inner ear structures grossly normal. Bone marrow signal intensity within normal limits. No scalp soft tissue abnormality. IMPRESSION: 1. 4 mm acute ischemic infarct within the anterior right centrum semi ovale, most likely due to acute small vessel  disease. 2. No other acute intracranial process. 3. Age-related cerebral atrophy with moderate chronic microvascular ischemic disease. Electronically Signed   By: Rise MuBenjamin  McClintock M.D.   On: 03/23/2016 00:54   Ct Abdomen Pelvis W Contrast  Result Date: 03/23/2016 CLINICAL DATA:  82 year old patient with epigastric pain for several months. Denies nausea vomiting. History of cholecystectomy. EXAM: CT ABDOMEN AND PELVIS WITH CONTRAST TECHNIQUE: Multidetector CT imaging of the abdomen and pelvis was performed using the standard protocol following bolus administration of intravenous contrast. CONTRAST:  100mL ISOVUE-300 IOPAMIDOL (ISOVUE-300) INJECTION 61% COMPARISON:  CT abdomen and pelvis with contrast 12/21/2015 FINDINGS: The heart is enlarged. Scattered coronary artery calcification noted. Two benign densely calcified granulomas left lower lobe, stable. Negative for consolidation or pleural effusion at the lung bases. Cholecystectomy. Negative for biliary ductal dilatation. Liver size normal. Negative for hepatic mass. The portal vein is patent. Normal spleen, adrenal glands, and pancreas. Symmetric bilateral cortical thinning of both kidneys, stable. Negative for hydronephrosis. No visible renal stones. Mild nonspecific perinephric stranding is unchanged compared to prior CT. Ureters normal in caliber bilaterally. The stomach is normally positioned and partially filled with oral contrast. Gastric wall appears normal in thickness. Distal esophagus appears normal. The duodenum is normal in caliber and shows no inflammatory change or wall thickening. Small bowel loops are normal in caliber. Terminal ileum unremarkable. Normal appendix. Normal caliber colon. Mild scattered diverticulosis. No colonic wall thickening. Normal appearance of the rectum. Prostamegaly. Prostate gland 6 cm transverse diameter. Slight bladder wall thickening appears stable, and may reflect changes of chronic bladder outlet obstruction.  Bilateral fat containing inguinal hernias. Abdominal aorta normal in caliber and contains moderate atherosclerosis. Moderate atherosclerosis of the bilateral iliac vasculature. The bilateral renal arteries appear patent. There are 2 right and 2 left renal arteries. The mesenteric vessels are patent. Negative for lymphadenopathy or ascites. Stable appearance of the visualized lower thoracic and the lumbar spine. Multiple prominent anterior osteophytes in the visualized thoracic spine. Stable home angioma at L2 vertebral body. Prominent Schmorl stenosis and L2 and L3. Partial fusion of L4 and L5 vertebral bodies and disc desiccation and calcification at L5-S1. Extensive facet joint degenerative change and hypertrophy in the lower lumbar spine. Postsurgical changes of the lumbar spine. IMPRESSION: 1. No acute findings are identified in the abdomen or pelvis. Specifically, no explanation for epigastric pain identified. No significant change identified compared to recent CT abdomen/pelvis of June 2017. 2. Cholecystectomy. 3. Mild colonic diverticulosis without acute inflammatory change. 4. Prostamegaly and possible probable chronic bladder wall thickening likely related to chronic bladder outlet obstruction. 5. Coronary artery and aortoiliac atherosclerosis. 6. Chronic degenerative changes and postsurgical changes of the lumbar spine. Electronically Signed   By: Britta MccreedySusan  Turner M.D.   On: 03/23/2016 09:56   Echo: Study Conclusions  - Left ventricle: The cavity size was normal. Septal wall thickness  was increased in a pattern of moderate LVH with severe posterior   wall hypertrophy. Systolic function was severely reduced. The   estimated ejection fraction was in the range of 25% to 30%.   Diffuse hypokinesis worse in the anterior, anteroseptal and   inferoseptal myocardium. Doppler parameters are consistent with   abnormal left ventricular relaxation (grade 1 diastolic   dysfunction). - Aortic valve:  Transvalvular velocity was within the normal range.   There was no stenosis. There was no regurgitation. - Mitral valve: Calcified annulus. Transvalvular velocity was   within the normal range. There was no evidence for stenosis.   There was no regurgitation. - Right ventricle: The cavity size was normal. Wall thickness was   normal. Systolic function was normal. - Atrial septum: No defect or patent foramen ovale was identified   by color flow Doppler. - Tricuspid valve: There was trivial regurgitation.  - Pulmonic valve: There was moderate regurgitation.  Subjective: Pt without complaints, still having light headedness when rising rapidly, better with slow progression. No chest pain, dyspnea, focal deficits.   Discharge Exam: Vitals:   03/26/16 1223 03/26/16 1455  BP: (!) 98/51 (!) 110/58  Pulse: (!) 51 (!) 54  Resp:  18  Temp:  97.5 F (36.4 C)   Vitals:   03/26/16 0922 03/26/16 1126 03/26/16 1223 03/26/16 1455  BP:  (!) 73/44 (!) 98/51 (!) 110/58  Pulse:  (!) 55 (!) 51 (!) 54  Resp:  16  18  Temp:  97.6 F (36.4 C)  97.5 F (36.4 C)  TempSrc:  Axillary  Oral  SpO2: 97% 96%  97%  Weight:      Height:       General: Pt is alert, awake, not in acute distress Cardiovascular: RRR, S1/S2 +, no rubs, no gallops Respiratory: CTA bilaterally, no wheezing, no rhonchi Abdominal: Soft, NT, ND, bowel sounds + Extremities: no edema, no cyanosis Neuro: No focal neurological deficits, though diffusely weak  The results of significant diagnostics from this hospitalization (including imaging, microbiology, ancillary and laboratory) are listed below for reference.    Microbiology: No results found for this or any previous visit (from the past 240 hour(s)).   Labs: BNP (last 3 results)  Recent Labs  11/15/15 2107 03/22/16 1840  BNP 42.7 482.9*   Basic Metabolic Panel:  Recent Labs Lab 03/22/16 1840 03/24/16 0557 03/25/16 0853 03/26/16 0610  NA 136 138 138 138  K 3.6  3.1* 3.6 3.4*  CL 106 96* 99* 99*  CO2 24 29 31 31   GLUCOSE 196* 154* 191* 156*  BUN 8 10 11 16   CREATININE 0.97 1.05 1.06 1.23  CALCIUM 9.3 8.9 9.6 9.4  MG  --   --  2.0  --    Liver Function Tests:  Recent Labs Lab 03/22/16 1840  AST 16  ALT 13*  ALKPHOS 77  BILITOT 0.7  PROT 6.3*  ALBUMIN 3.5    Recent Labs Lab 03/22/16 2138  LIPASE 16   No results for input(s): AMMONIA in the last 168 hours. CBC:  Recent Labs Lab 03/22/16 1840 03/24/16 0557  WBC 5.9 4.9  NEUTROABS 3.5 2.8  HGB 13.5 13.6  HCT 40.8 41.4  MCV 91.1 89.4  PLT 173 165   Cardiac Enzymes:  Recent Labs Lab 03/23/16 0504 03/23/16 0856 03/25/16 0853 03/25/16 1201 03/25/16 1510  TROPONINI 0.04* 0.03* <0.03 <0.03 <0.03   BNP: Invalid input(s): POCBNP CBG:  Recent Labs Lab 03/25/16 1618 03/25/16 2126 03/26/16 0616 03/26/16  1123 03/26/16 1625  GLUCAP 153* 176* 168* 197* 196*   D-Dimer No results for input(s): DDIMER in the last 72 hours. Hgb A1c No results for input(s): HGBA1C in the last 72 hours. Lipid Profile No results for input(s): CHOL, HDL, LDLCALC, TRIG, CHOLHDL, LDLDIRECT in the last 72 hours. Thyroid function studies No results for input(s): TSH, T4TOTAL, T3FREE, THYROIDAB in the last 72 hours.  Invalid input(s): FREET3 Anemia work up No results for input(s): VITAMINB12, FOLATE, FERRITIN, TIBC, IRON, RETICCTPCT in the last 72 hours. Urinalysis    Component Value Date/Time   COLORURINE YELLOW 03/22/2016 1825   APPEARANCEUR CLEAR 03/22/2016 1825   APPEARANCEUR Clear 06/05/2013 1502   LABSPEC 1.015 03/22/2016 1825   LABSPEC 1.012 06/05/2013 1502   PHURINE 6.0 03/22/2016 1825   GLUCOSEU 250 (A) 03/22/2016 1825   GLUCOSEU 50 mg/dL 85/90/9311 2162   HGBUR SMALL (A) 03/22/2016 1825   BILIRUBINUR NEGATIVE 03/22/2016 1825   BILIRUBINUR Negative 06/05/2013 1502   KETONESUR NEGATIVE 03/22/2016 1825   PROTEINUR 30 (A) 03/22/2016 1825   NITRITE NEGATIVE 03/22/2016 1825    LEUKOCYTESUR NEGATIVE 03/22/2016 1825   LEUKOCYTESUR Negative 06/05/2013 1502   Sepsis Labs Invalid input(s): PROCALCITONIN,  WBC,  LACTICIDVEN Microbiology No results found for this or any previous visit (from the past 240 hour(s)).  Time coordinating discharge: Over 30 minutes  Hazeline Junker, MD  [facility] 03/26/2016, 4:26 PM Pager [phone]  If 7PM-7AM, please contact night-coverage [email or web address] Password [email or web address]

## 2016-03-26 NOTE — Clinical Social Work Note (Signed)
Clinical Social Work Assessment  Patient Details  Name: Bobby Krueger MRN: [id number] Date of Birth: January 01, 1927  Date of referral:  03/26/16               Reason for consult:  Facility Placement                Permission sought to share information with:  Family Supports Permission granted to share information::  Yes, Verbal Permission Granted  Name::     Keishawn Rajewski  Relationship::  son   Contact Information:  [phone]  Housing/Transportation Living arrangements for the past 2 months:  [place] of Information:  Patient, Adult Children Patient Interpreter Needed:  None Criminal Activity/Legal Involvement Pertinent to Current Situation/Hospitalization:  No - Comment as needed Significant Relationships:  Adult Children Lives with:  Self Do you feel safe going back to the place where you live?  Yes Need for family participation in patient care:  Yes (Comment)  Care giving concerns:  Pt may need LTC after STR, pt's son is inquiring with [facility] about benefits.   Social Worker assessment / plan:  CSW met with pt to address consult for New SNF. CSW introduced herself and explained role of social work. CSW also explained the process of discharging to SNF. Pt shared that he has been to SNF before and is familiar with the process. Pt lives alone and his son is supportive. CSW initiated SNF search and followed up with bed offers. Pt chose [facility]. CSW updated son. CSW also updated [facility], who are able to accept pt either today or tomorrow. CSW will continue to follow.   Employment status:  Retired Forensic scientist:  Medicare PT Recommendations:  [place] / Referral to community resources:  [place]  Patient/Family's Response to care:  Pt and son were appreciative of CSW support.   Patient/Family's Understanding of and Emotional Response to Diagnosis, Current Treatment, and Prognosis:  Pt and son  are aware that pt would benefit from STR prior to new level of care.   Emotional Assessment Appearance:  Appears stated age Attitude/Demeanor/Rapport:   (appropriate) Affect (typically observed):  Accepting, Adaptable, Pleasant Orientation:  Oriented to Self, Oriented to Place, Oriented to  Time, Oriented to Situation Alcohol / Substance use:  Never Used Psych involvement (Current and /or in the community):  No (Comment)  Discharge Needs  Concerns to be addressed:  No discharge needs identified Readmission within the last 30 days:  No Current discharge risk:  Chronically ill Barriers to Discharge:  No Barriers Identified   Darden Dates, LCSW 03/26/2016, 2:47 PM

## 2016-03-26 NOTE — Progress Notes (Signed)
Physical Therapy Treatment Patient Details Name: Bobby Krueger MRN: [id number] DOB: Mar 09, 1927 Today's Date: 03/26/2016    History of Present Illness 82 yo male admitted with dizziness and MRI(+_) R centrum semiovale infarct. PMH:    PT Comments    Patient limited today by dizziness and nausea. Current plan remains appropriate.  Follow Up Recommendations  SNF     Equipment Recommendations  None recommended by PT    Recommendations for Other Services       Precautions / Restrictions Precautions Precautions: Fall Restrictions Weight Bearing Restrictions: No    Mobility  Bed Mobility Overal bed mobility: Needs Assistance Bed Mobility: Supine to Sit     Supine to sit: Supervision     General bed mobility comments: supervision for safety; increased time  Transfers Overall transfer level: Needs assistance Equipment used: Straight cane Transfers: Sit to/from Stand;Stand Pivot Transfers Sit to Stand: Supervision Stand pivot transfers: Supervision       General transfer comment: X2 from EOB; pt with safe hand placement and technique; took steps to recliner with supervision and [name]  Ambulation/Gait                 Stairs            Wheelchair Mobility    Modified Rankin (Stroke Patients Only) Modified Rankin (Stroke Patients Only) Pre-Morbid Rankin Score: No symptoms Modified Rankin: Moderate disability     Balance Overall balance assessment: Needs assistance   Sitting balance-Leahy Scale: Good     Standing balance support: Single extremity supported Standing balance-Leahy Scale: Fair                      Cognition Arousal/Alertness: Awake/alert Behavior During Therapy: WFL for tasks assessed/performed Overall Cognitive Status: Within Functional Limits for tasks assessed                      Exercises      General Comments General comments (skin integrity, edema, etc.): Pt c/o dizziness; BP in sitting 102/55 and in  standing BP 97/50 with SpO2 desat in low 80s on RA; 2L O2 reapplied and SpO2 up to 95%; after ~70mins EOB pt reported decreased feeling of dizziness       Pertinent Vitals/Pain Pain Assessment: No/denies pain    Home Living                      Prior Function            PT Goals (current goals can now be found in the care plan section) Acute Rehab PT Goals Patient Stated Goal: get better and go home PT Goal Formulation: With patient Time For Goal Achievement: 03/30/16 Potential to Achieve Goals: Good Progress towards PT goals: Progressing toward goals    Frequency  Min 3X/week    PT Plan Current plan remains appropriate    Co-evaluation             End of Session Equipment Utilized During Treatment: Gait belt;Oxygen Activity Tolerance: Treatment limited secondary to medical complications (Comment) (dizziness, "whooziness") Patient left: in chair;with call bell/phone within reach;with chair alarm set     Time: 1330-1357 PT Time Calculation (min) (ACUTE ONLY): 27 min  Charges:  $Therapeutic Activity: 23-37 mins                    G Codes:      Derek Mound, PTA Pager: [phone])  [phone]   03/26/2016, 2:08 PM

## 2016-03-26 NOTE — Clinical Social Work Placement (Signed)
   CLINICAL SOCIAL WORK PLACEMENT  NOTE  Date:  03/26/2016  Patient Details  Name: Bobby Krueger MRN: [id number] Date of Birth: [birth year]  Clinical Social Work is seeking post-discharge placement for this patient at the Skilled  Nursing Facility level of care (*CSW will initial, date and re-position this form in  chart as items are completed):  Yes   Patient/family provided with [facility] Clinical Social Work Department's list of facilities offering this level of care within the geographic area requested by the patient (or if unable, by the patient's family).  Yes   Patient/family informed of their freedom to choose among providers that offer the needed level of care, that participate in Medicare, Medicaid or managed care program needed by the patient, have an available bed and are willing to accept the patient.  Yes   Patient/family informed of [facility]'s ownership interest in [facility] and [facility], as well as of the fact that they are under no obligation to receive care at these facilities.  PASRR submitted to EDS on       PASRR number received on       Existing PASRR number confirmed on 03/26/16     FL2 transmitted to all facilities in geographic area requested by pt/family on 03/26/16     FL2 transmitted to all facilities within larger geographic area on       Patient informed that his/her managed care company has contracts with or will negotiate with certain facilities, including the following:        Yes   Patient/family informed of bed offers received.  Patient chooses bed at [facility]     Physician recommends and patient chooses bed at      Patient to be transferred to [facility] on  .  Patient to be transferred to facility by       Patient family notified on   of transfer.  Name of family member notified:        PHYSICIAN       Additional Comment:    _______________________________________________ Dede Query,  LCSW 03/26/2016, 2:27 PM

## 2016-03-26 NOTE — Progress Notes (Signed)
Patient is complaining of difficulty swallowing. Was given water, but Clinical research associate did not notice any choking. But patient said it takes him a minute to swallow it. MD notified.

## 2016-03-26 NOTE — Progress Notes (Signed)
Was called to the room by Nurse tech that patient is complaining of dizziness. Vital signs checked, patient's BP low at 73/44. MD paged to notify. Will continue to monitor.

## 2016-03-26 NOTE — Progress Notes (Signed)
SUBJECTIVE: No chest pain last 24 hours. Mild dyspnea  Tele: sinus with PVCs  BP (!) 116/57 (BP Location: Left Arm)   Pulse 64   Temp 98.3 F (36.8 C) (Oral)   Resp 16   Ht 5\' 6"  (1.676 m)   Wt 200 lb 8 oz (90.9 kg)   SpO2 95%   BMI 32.36 kg/m   Intake/Output Summary (Last 24 hours) at 03/26/16 0852 Last data filed at 03/26/16 0428  Gross per 24 hour  Intake              240 ml  Output              450 ml  Net             -210 ml    PHYSICAL EXAM General: Well developed, well nourished, in no acute distress. Alert and oriented x 3.  Psych:  Good affect, responds appropriately Neck: No JVD. No masses noted.  Lungs: Clear bilaterally with no wheezes or rhonci noted.  Heart: RRR with no murmurs noted. Abdomen: Bowel sounds are present. Soft, non-tender.  Extremities: No lower extremity edema.   LABS: Basic Metabolic Panel:  Recent Labs  59/56/38 0853 03/26/16 0610  NA 138 138  K 3.6 3.4*  CL 99* 99*  CO2 31 31  GLUCOSE 191* 156*  BUN 11 16  CREATININE 1.06 1.23  CALCIUM 9.6 9.4  MG 2.0  --    CBC:  Recent Labs  03/24/16 0557  WBC 4.9  NEUTROABS 2.8  HGB 13.6  HCT 41.4  MCV 89.4  PLT 165   Cardiac Enzymes:  Recent Labs  03/25/16 0853 03/25/16 1201 03/25/16 1510  TROPONINI <0.03 <0.03 <0.03   Fasting Lipid Panel: No results for input(s): CHOL, HDL, LDLCALC, TRIG, CHOLHDL, LDLDIRECT in the last 72 hours.  Current Meds: . apixaban  5 mg Oral BID  . atorvastatin  20 mg Oral q1800  . brimonidine  1 drop Both Eyes Daily  . carvedilol  6.25 mg Oral BID WC  . dextromethorphan-guaiFENesin  1 tablet Oral BID  . furosemide  40 mg Oral Daily  . gabapentin  300 mg Oral QHS  . insulin aspart  0-9 Units Subcutaneous TID WC  . insulin glargine  7 Units Subcutaneous QHS  . latanoprost  1 drop Both Eyes QHS  . losartan  50 mg Oral Daily  . mometasone-formoterol  2 puff Inhalation BID  . omega-3 acid ethyl esters  1 g Oral Daily  . pantoprazole   40 mg Oral Daily  . potassium chloride  20 mEq Oral Daily  . terazosin  5 mg Oral QPM  . tiotropium  18 mcg Inhalation Daily     ASSESSMENT AND PLAN:  1. Chest pain: He is known to have evidence of CAD by CT scan but no prior cardiac cath. He has chest pain for several years, no change in character or intensity. This has been followed by Dr. Lady Gary. His symptoms during this hospitalization are c/w chronic stable angina. Echo is reviewed by me and compared to the echo from may 2017. There is no change in LVEF by my assessment. His LVEF is around 35-40% on both studies. I would not pursue ischemic testing at this time given stability of LVEF, no change in his chronic pain and recent CVA. Can consider outpatient stress testing when he sees his cardiologist DR. Fath in [place] - Continue beta blocker and Statin. He has been restarted on  Eliquis 5mg  BID, so no ASA or Plavix at this time.  2. Paroxysmal Atrial Fibrillation: now maintaining NSR with PVCs. Currently receiving Coreg 6.25mg  BID. This was stopped during a previous hospitalization due to bradycardia. HR currently in the 60's - 70's.  - He has been restarted onEliquis with CHADSVASC of 8.  3. Cardiomyopathy/Chronic systolic CHF: Echo with LVEF35-40% by my assessment with no change in comparison to echo in May 2017. Volume status is ok today. His lungs are clear. Continue daily Lasix. Continue current meds.    4. Acute CVA: Neurology following. High risk of recurrent CVA with atrial fib off of Eliquis (he had not been taking for at least 2 months). Eliquis 5mg  BID has been restarted by pharmacy. Plavix stopped.   Bobby Krueger  9/8/20178:52 AM

## 2016-03-26 NOTE — Clinical Social Work Placement (Signed)
   CLINICAL SOCIAL WORK PLACEMENT  NOTE  Date:  03/26/2016  Patient Details  Name: Bobby Krueger MRN: [id number] Date of Birth: 1926-07-23  Clinical Social Work is seeking post-discharge placement for this patient at the Skilled  Nursing Facility level of care (*CSW will initial, date and re-position this form in  chart as items are completed):  Yes   Patient/family provided with [facility] Clinical Social Work Department's list of facilities offering this level of care within the geographic area requested by the patient (or if unable, by the patient's family).  Yes   Patient/family informed of their freedom to choose among providers that offer the needed level of care, that participate in Medicare, Medicaid or managed care program needed by the patient, have an available bed and are willing to accept the patient.  Yes   Patient/family informed of 's ownership interest in [facility] and [facility], as well as of the fact that they are under no obligation to receive care at these facilities.  PASRR submitted to EDS on       PASRR number received on       Existing PASRR number confirmed on 03/26/16     FL2 transmitted to all facilities in geographic area requested by pt/family on 03/26/16     FL2 transmitted to all facilities within larger geographic area on       Patient informed that his/her managed care company has contracts with or will negotiate with certain facilities, including the following:        Yes   Patient/family informed of bed offers received.  Patient chooses bed at [facility]     Physician recommends and patient chooses bed at      Patient to be transferred to [facility] on 03/26/16.  Patient to be transferred to facility by ambulance     Patient family notified on 03/26/16 of transfer.  Name of family member notified:  Ernestine Conrad     PHYSICIAN Please prepare priority discharge summary, including medications,  Please prepare prescriptions, Please sign FL2     Additional Comment:  Per MD patient is ready to discharge to [facility] . RN, patient, patient's family, and facility notified of discharge. RN given phone number for report and transport packet is on patient's chart. Ambulance transport requested. CSW signing off.   _______________________________________________ Reggy Eye, LCSW 03/26/2016, 5:26 PM

## 2016-03-26 NOTE — Progress Notes (Signed)
Patient is being d/c to a nursing home. Report called to the receiving nurse. Patient awaiting PTAR at this time.

## 2016-03-26 NOTE — Care Management Important Message (Signed)
Important Message  Patient Details  Name: Bobby Krueger MRN: [id number] Date of Birth: 1927/06/26   Medicare Important Message Given:  Yes    [name] Stefan Church 03/26/2016, 10:32 AM

## 2016-03-26 NOTE — Progress Notes (Signed)
Patient ambulated about 90 feet. Oxygen sat was maintained at 97. Patient did drop to 93, but only for few seconds.

## 2016-06-07 ENCOUNTER — Ambulatory Visit: Payer: Medicare Other | Admitting: Neurology

## 2016-06-08 ENCOUNTER — Encounter: Payer: Self-pay | Admitting: Neurology

## 2016-06-09 ENCOUNTER — Encounter: Payer: Self-pay | Admitting: Emergency Medicine

## 2016-06-09 ENCOUNTER — Observation Stay
Admission: EM | Admit: 2016-06-09 | Discharge: 2016-06-10 | Disposition: A | Discharge status: Home / Self Care | Payer: Medicare Other | Attending: Specialist | Admitting: Specialist

## 2016-06-09 DIAGNOSIS — R531 Weakness: Secondary | ICD-10-CM

## 2016-06-09 DIAGNOSIS — E7800 Pure hypercholesterolemia, unspecified: Secondary | ICD-10-CM | POA: Insufficient documentation

## 2016-06-09 DIAGNOSIS — I4891 Unspecified atrial fibrillation: Secondary | ICD-10-CM | POA: Diagnosis present

## 2016-06-09 DIAGNOSIS — I959 Hypotension, unspecified: Secondary | ICD-10-CM | POA: Diagnosis not present

## 2016-06-09 DIAGNOSIS — K219 Gastro-esophageal reflux disease without esophagitis: Secondary | ICD-10-CM | POA: Insufficient documentation

## 2016-06-09 DIAGNOSIS — I482 Chronic atrial fibrillation: Principal | ICD-10-CM | POA: Insufficient documentation

## 2016-06-09 DIAGNOSIS — Z79899 Other long term (current) drug therapy: Secondary | ICD-10-CM | POA: Diagnosis not present

## 2016-06-09 DIAGNOSIS — I110 Hypertensive heart disease with heart failure: Secondary | ICD-10-CM | POA: Diagnosis not present

## 2016-06-09 DIAGNOSIS — Z794 Long term (current) use of insulin: Secondary | ICD-10-CM | POA: Diagnosis not present

## 2016-06-09 DIAGNOSIS — I5042 Chronic combined systolic (congestive) and diastolic (congestive) heart failure: Secondary | ICD-10-CM | POA: Insufficient documentation

## 2016-06-09 DIAGNOSIS — J449 Chronic obstructive pulmonary disease, unspecified: Secondary | ICD-10-CM | POA: Diagnosis not present

## 2016-06-09 DIAGNOSIS — Z87891 Personal history of nicotine dependence: Secondary | ICD-10-CM | POA: Insufficient documentation

## 2016-06-09 DIAGNOSIS — Z7901 Long term (current) use of anticoagulants: Secondary | ICD-10-CM | POA: Insufficient documentation

## 2016-06-09 DIAGNOSIS — E785 Hyperlipidemia, unspecified: Secondary | ICD-10-CM | POA: Diagnosis not present

## 2016-06-09 DIAGNOSIS — E119 Type 2 diabetes mellitus without complications: Secondary | ICD-10-CM

## 2016-06-09 LAB — URINALYSIS COMPLETE WITH MICROSCOPIC (ARMC ONLY)
Bilirubin Urine: NEGATIVE
GLUCOSE, UA: 50 mg/dL — AB
HGB URINE DIPSTICK: NEGATIVE
Ketones, ur: NEGATIVE mg/dL
LEUKOCYTES UA: NEGATIVE
Nitrite: NEGATIVE
PH: 5 (ref 5.0–8.0)
PROTEIN: NEGATIVE mg/dL
SPECIFIC GRAVITY, URINE: 1.012 (ref 1.005–1.030)

## 2016-06-09 LAB — BASIC METABOLIC PANEL
Anion gap: 6 (ref 5–15)
BUN: 22 mg/dL — AB (ref 6–20)
CHLORIDE: 100 mmol/L — AB (ref 101–111)
CO2: 29 mmol/L (ref 22–32)
CREATININE: 1.19 mg/dL (ref 0.61–1.24)
Calcium: 8.5 mg/dL — ABNORMAL LOW (ref 8.9–10.3)
GFR calc Af Amer: >60 mL/min (ref >60)
GFR calc non Af Amer: 53 mL/min — ABNORMAL LOW (ref >60)
GLUCOSE: 223 mg/dL — AB (ref 65–99)
POTASSIUM: 4 mmol/L (ref 3.5–5.1)
SODIUM: 135 mmol/L (ref 135–145)

## 2016-06-09 LAB — CBC WITH DIFFERENTIAL/PLATELET
Basophils Absolute: 0 10*3/uL (ref 0–0.1)
Basophils Relative: 0 %
EOS ABS: 0.5 10*3/uL (ref 0–0.7)
EOS PCT: 6 %
HCT: 36.6 % — ABNORMAL LOW (ref 40.0–52.0)
HEMOGLOBIN: 12.7 g/dL — AB (ref 13.0–18.0)
LYMPHS ABS: 1.9 10*3/uL (ref 1.0–3.6)
LYMPHS PCT: 23 %
MCH: 30.3 pg (ref 26.0–34.0)
MCHC: 34.6 g/dL (ref 32.0–36.0)
MCV: 87.5 fL (ref 80.0–100.0)
MONOS PCT: 6 %
Monocytes Absolute: 0.5 10*3/uL (ref 0.2–1.0)
Neutro Abs: 5.3 10*3/uL (ref 1.4–6.5)
Neutrophils Relative %: 65 %
PLATELETS: 163 10*3/uL (ref 150–440)
RBC: 4.18 MIL/uL — AB (ref 4.40–5.90)
RDW: 12.8 % (ref 11.5–14.5)
WBC: 8.1 10*3/uL (ref 3.8–10.6)

## 2016-06-09 LAB — TROPONIN I

## 2016-06-09 LAB — GLUCOSE, CAPILLARY: GLUCOSE-CAPILLARY: 237 mg/dL — AB (ref 65–99)

## 2016-06-09 NOTE — ED Provider Notes (Signed)
[facility] Emergency Department Provider Note    ____________________________________________   I have reviewed the triage vital signs and the nursing notes.   HISTORY  Chief Complaint Weakness   History limited by: Not Limited   HPI Bobby Krueger is a 82 y.o. male who presents to the emergency department today via EMS. Apparently the patient's daughter who he is currently living with called EMS because she felt he was more sleepy and weak today than baseline. Patient himself was not even aware that the daughter had called EMS until they arrive. He states he did feel a little bit tired today and a little sleepy. He states he felt the same way yesterday. He denies any pain. Denies any fevers. No shortness of breath.    Past Medical History:  Diagnosis Date  . A-fib (HCC)   . Chronic combined systolic (congestive) and diastolic (congestive) heart failure    a. 03/2014: EF 35%, normal nuc in 05/2014 b. echo 11/2015: EF 40-45% w/ diffuse HK and Grade 1 DD  . COPD (chronic obstructive pulmonary disease) (HCC)   . Diabetes mellitus without complication (HCC)   . High cholesterol   . Hypertension     Patient Active Problem List   Diagnosis Date Noted  . Cerebral thrombosis with cerebral infarction 03/23/2016  . Acute ischemic stroke (HCC) 03/23/2016  . Acute CVA (cerebrovascular accident) (HCC) 03/23/2016  . Pain in the chest 03/22/2016  . Dizziness 03/22/2016  . Abdominal pain 03/22/2016  . Essential hypertension 03/22/2016  . Chronic combined systolic (congestive) and diastolic (congestive) heart failure   . 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 Bradycardia 02/10/2016  . Atrial fibrillation (HCC) 11/19/2015  . CAD (coronary artery disease) 11/16/2015  . COPD (chronic obstructive pulmonary disease) (HCC) 11/16/2015  . GERD (gastroesophageal reflux disease) 11/16/2015    Past Surgical History:   Procedure Laterality Date  . BACK SURGERY    . CARPAL TUNNEL RELEASE    . CHOLECYSTECTOMY      Prior to Admission medications   Medication Sig Start Date End Date Taking? Authorizing Provider  albuterol (PROAIR HFA) 108 (90 Base) MCG/ACT inhaler Inhale 2 puffs into the lungs every 4 (four) hours as needed for wheezing or shortness of breath.     Historical Provider, MD  apixaban (ELIQUIS) 5 MG TABS tablet Take 1 tablet (5 mg total) by mouth 2 (two) times daily. Patient not taking: Reported on 03/22/2016 11/21/15   [name], MD  atorvastatin (LIPITOR) 20 MG tablet Take 1 tablet (20 mg total) by mouth daily at 6 PM. 03/26/16   [name], MD  brimonidine (ALPHAGAN) 0.2 % ophthalmic solution Place 1 drop into both eyes daily.     Historical Provider, MD  carvedilol (COREG) 6.25 MG tablet Take 6.25 mg by mouth 2 (two) times daily with a meal.     Historical Provider, MD  Fluticasone-Salmeterol (ADVAIR DISKUS) 250-50 MCG/DOSE AEPB Inhale 1 puff into the lungs daily as needed (for wheezing or shortness).     Historical Provider, MD  furosemide (LASIX) 40 MG tablet Take 1 tablet (40 mg total) by mouth daily. 02/23/16   [name], NP  gabapentin (NEURONTIN) 300 MG capsule Take 300 mg by mouth at bedtime.    Historical Provider, MD  ibuprofen (ADVIL,MOTRIN) 400 MG tablet Take 400 mg by mouth every 6 (six) hours as needed for moderate pain.    Historical Provider, MD  insulin glargine (LANTUS)  100 UNIT/ML injection Inject 0.08 mLs (8 Units total) into the skin at bedtime. Patient taking differently: Inject 10 Units into the skin at bedtime.  02/14/16   Wyatt Hasteavid K Hower, MD  insulin lispro (HUMALOG) 100 UNIT/ML injection Inject 0-9 Units into the skin 3 (three) times daily before meals. CBG's 70-120 = 0 units; 121-150 =1 units; 151-200 = 2 units; 201-250 =3 units; 251-300 =5 units; 301-350 =7 units; 351-400=9 units and > 400 Notify provider.    Historical Provider, MD  ipratropium-albuterol (DUONEB)  0.5-2.5 (3) MG/3ML SOLN Take 3 mLs by nebulization every 6 (six) hours as needed (for SOB).    Historical Provider, MD  ketorolac (ACULAR) 0.5 % ophthalmic solution Place 1 drop into the right eye 4 (four) times daily. Patient not taking: Reported on 03/22/2016 02/02/16   Delorise RoyalsJonathan D Cuthriell, PA-C  latanoprost (XALATAN) 0.005 % ophthalmic solution Place 1 drop into both eyes at bedtime.    Historical Provider, MD  losartan (COZAAR) 25 MG tablet Take 1 tablet (25 mg total) by mouth daily. 03/27/16   [name], MD  nateglinide (STARLIX) 120 MG tablet Take 120 mg by mouth daily. 10/02/15   Historical Provider, MD  Omega-3 1000 MG CAPS Take 1 g by mouth daily.    Historical Provider, MD  omeprazole (PRILOSEC) 20 MG capsule Take 20 mg by mouth daily.    Historical Provider, MD  polyvinyl alcohol (LIQUIFILM TEARS) 1.4 % ophthalmic solution Place 1 drop into both eyes as needed for dry eyes. Reported on 12/21/2015    Historical Provider, MD  potassium chloride SA (K-DUR,KLOR-CON) 20 MEQ tablet Take 20 mEq by mouth daily.    Historical Provider, MD  predniSONE (STERAPRED UNI-PAK 21 TAB) 10 MG (21) TBPK tablet Take 1 tablet (10 mg total) by mouth daily. 40mg  x1 day, 20mg  x2 day, 10mg  x2 day, then stop Patient not taking: Reported on 03/22/2016 02/14/16   Wyatt Hasteavid K Hower, MD  promethazine (PHENERGAN) 25 MG tablet Take 12.5-25 mg by mouth every 4 (four) hours as needed.    Historical Provider, MD  SPIRIVA HANDIHALER 18 MCG inhalation capsule Place 1 application into inhaler and inhale daily. 10/13/15   Historical Provider, MD  terazosin (HYTRIN) 5 MG capsule Take 5 mg by mouth every evening.    Historical Provider, MD  triamcinolone cream (KENALOG) 0.1 % Apply 1 application topically 2 (two) times daily as needed (for irritation).    Historical Provider, MD  trimethoprim-polymyxin b (POLYTRIM) ophthalmic solution Place 2 drops into the right eye every 6 (six) hours. Patient not taking: Reported on 03/22/2016 02/02/16    Delorise RoyalsJonathan D Cuthriell, PA-C    Allergies Aldactone [spironolactone]; Aspirin; Ciprofloxacin; Cyclobenzaprine; and Lexapro [escitalopram]  Family History  Problem Relation Age of Onset  . Prostate cancer      Social History Social History  Substance Use Topics  . Smoking status: Former Games developermoker  . Smokeless tobacco: Never Used  . Alcohol use No    Review of Systems  Constitutional: Negative for fever. Positive for generalized weakness.  Cardiovascular: Negative for chest pain. Respiratory: Negative for shortness of breath. Gastrointestinal: Negative for abdominal pain, vomiting and diarrhea. Genitourinary: Negative for dysuria. Musculoskeletal: Negative for back pain. Skin: Negative for rash. Neurological: Negative for headaches, focal weakness or numbness.  10-point ROS otherwise negative.  ____________________________________________   PHYSICAL EXAM:  VITAL SIGNS: ED Triage Vitals  Enc Vitals Group     BP 06/09/16 2104 (!) 112/55     Pulse Rate 06/09/16 2104  83     Resp 06/09/16 2104 18     Temp 06/09/16 2104 97.9 F (36.6 C)     Temp Source 06/09/16 2104 Oral     SpO2 06/09/16 2104 98 %     Weight 06/09/16 2109 193 lb (87.5 kg)     Height 06/09/16 2109 5\' 6"  (1.676 m)   Constitutional: Alert and oriented. Well appearing and in no distress. Eyes: Conjunctivae are normal. Normal extraocular movements. ENT   Head: Normocephalic and atraumatic.   Nose: No congestion/rhinnorhea.   Mouth/Throat: Mucous membranes are moist.   Neck: No stridor. Hematological/Lymphatic/Immunilogical: No cervical lymphadenopathy. Cardiovascular: Irregularly irregular rhythm. No murmurs, rubs, or gallops.  Respiratory: Normal respiratory effort without tachypnea nor retractions. Breath sounds are clear and equal bilaterally. No wheezes/rales/rhonchi. Gastrointestinal: Soft and nontender. No distention.  Genitourinary: Deferred Musculoskeletal: Normal range of motion in  all extremities. No lower extremity edema. Neurologic:  Normal speech and language. No gross focal neurologic deficits are appreciated.  Skin:  Skin is warm, dry and intact. No rash noted. Psychiatric: Mood and affect are normal. Speech and behavior are normal. Patient exhibits appropriate insight and judgment.  ____________________________________________    LABS (pertinent positives/negatives)  Labs Reviewed  GLUCOSE, CAPILLARY - Abnormal; Notable for the following:       Result Value   Glucose-Capillary 237 (*)    All other components within normal limits  CBC WITH DIFFERENTIAL/PLATELET - Abnormal; Notable for the following:    RBC 4.18 (*)    Hemoglobin 12.7 (*)    HCT 36.6 (*)    All other components within normal limits  BASIC METABOLIC PANEL - Abnormal; Notable for the following:    Chloride 100 (*)    Glucose, Bld 223 (*)    BUN 22 (*)    Calcium 8.5 (*)    GFR calc non Af Amer 53 (*)    All other components within normal limits  URINALYSIS COMPLETEWITH MICROSCOPIC ([facility] ONLY) - Abnormal; Notable for the following:    Color, Urine YELLOW (*)    APPearance CLEAR (*)    Glucose, UA 50 (*)    Bacteria, UA RARE (*)    Squamous Epithelial / LPF 0-5 (*)    All other components within normal limits  TROPONIN I     ____________________________________________   EKG  I, Phineas Semen, attending physician, personally viewed and interpreted this EKG  EKG Time: 2115 Rate: 81 Rhythm: atrial fibrillation Axis: left axis devation Intervals: qtc 487 QRS: LBBB ST changes: no st elevation equivalent Impression: abnormal ekg  LBBB noted in previous EKG dated 03/25/2016   ____________________________________________    RADIOLOGY  None  ____________________________________________   PROCEDURES  Procedures  ____________________________________________   INITIAL IMPRESSION / ASSESSMENT AND PLAN / ED COURSE  Pertinent labs & imaging results that were  available during my care of the patient were reviewed by me and considered in my medical decision making (see chart for details).  Patient presented to the emergency department today because of concerns for weakness. I did discuss with the daughter when she arrived to the emergency department. There is also some concern for low blood pressure reading. Additionally the patient had findings of A. fib on EKG. Per family and patient no history of atrial fibrillation. Given this concern for new onset A. fib and setting up for weakness and hypotension. Will plan on admission to the hospital service for further workup and evaluation. ____________________________________________   FINAL CLINICAL IMPRESSION(S) / ED DIAGNOSES  Final  diagnoses:  Weakness  Atrial fibrillation, unspecified type [facility])     Note: This dictation was prepared with Dragon dictation. Any transcriptional errors that result from this process are unintentional    Phineas Semen, MD 06/09/16 2332

## 2016-06-09 NOTE — ED Triage Notes (Signed)
Pt coming from home for weakness. Daughter called EMS because pt has been more sleepy today. Pt denies pain.

## 2016-06-10 DIAGNOSIS — R531 Weakness: Secondary | ICD-10-CM

## 2016-06-10 DIAGNOSIS — I482 Chronic atrial fibrillation: Secondary | ICD-10-CM | POA: Diagnosis not present

## 2016-06-10 LAB — GLUCOSE, CAPILLARY
Glucose-Capillary: 187 mg/dL — ABNORMAL HIGH (ref 65–99)
Glucose-Capillary: 104 mg/dL — ABNORMAL HIGH (ref 65–99)
Glucose-Capillary: 197 mg/dL — ABNORMAL HIGH (ref 65–99)

## 2016-06-10 LAB — CBC
HEMATOCRIT: 35.9 % — AB (ref 40.0–52.0)
HEMOGLOBIN: 12.3 g/dL — AB (ref 13.0–18.0)
MCH: 30.2 pg (ref 26.0–34.0)
MCHC: 34.3 g/dL (ref 32.0–36.0)
MCV: 87.8 fL (ref 80.0–100.0)
Platelets: 154 10*3/uL (ref 150–440)
RBC: 4.09 MIL/uL — AB (ref 4.40–5.90)
RDW: 13.2 % (ref 11.5–14.5)
WBC: 7.3 10*3/uL (ref 3.8–10.6)

## 2016-06-10 LAB — BASIC METABOLIC PANEL
ANION GAP: 4 — AB (ref 5–15)
BUN: 20 mg/dL (ref 6–20)
CO2: 31 mmol/L (ref 22–32)
Calcium: 8.6 mg/dL — ABNORMAL LOW (ref 8.9–10.3)
Chloride: 102 mmol/L (ref 101–111)
Creatinine, Ser: 1.08 mg/dL (ref 0.61–1.24)
GFR calc Af Amer: >60 mL/min (ref >60)
GFR, EST NON AFRICAN AMERICAN: 60 mL/min — AB (ref >60)
GLUCOSE: 152 mg/dL — AB (ref 65–99)
POTASSIUM: 3.8 mmol/L (ref 3.5–5.1)
SODIUM: 137 mmol/L (ref 135–145)

## 2016-06-10 LAB — TROPONIN I: Troponin I: <0.03 ng/mL

## 2016-06-10 MED ORDER — POLYVINYL ALCOHOL 1.4 % OP SOLN: 1.0000 [drp] | OPHTHALMIC | Status: DC | PRN

## 2016-06-10 MED ORDER — HYDROCODONE-ACETAMINOPHEN 5-325 MG PO TABS: 1.0000 | ORAL_TABLET | ORAL | Status: DC | PRN

## 2016-06-10 MED ORDER — IPRATROPIUM-ALBUTEROL 0.5-2.5 (3) MG/3ML IN SOLN: 3.0000 mL | Freq: Four times a day (QID) | RESPIRATORY_TRACT | Status: DC | PRN

## 2016-06-10 MED ORDER — FUROSEMIDE 40 MG PO TABS
40.0000 mg | ORAL_TABLET | Freq: Every day | ORAL | Status: DC
  Administered 2016-06-10: 40 mg via ORAL
  Filled 2016-06-10: qty 1

## 2016-06-10 MED ORDER — SODIUM CHLORIDE 0.9% FLUSH: 3.0000 mL | Freq: Two times a day (BID) | INTRAVENOUS | Status: DC

## 2016-06-10 MED ORDER — INSULIN GLARGINE 100 UNIT/ML ~~LOC~~ SOLN
8.0000 [IU] | Freq: Every day | SUBCUTANEOUS | Status: DC
  Administered 2016-06-10: 8 [IU] via SUBCUTANEOUS
  Filled 2016-06-10 (×2): qty 0.08

## 2016-06-10 MED ORDER — CARVEDILOL 6.25 MG PO TABS
6.2500 mg | ORAL_TABLET | Freq: Two times a day (BID) | ORAL | Status: DC
  Administered 2016-06-10: 6.25 mg via ORAL
  Filled 2016-06-10: qty 1

## 2016-06-10 MED ORDER — LOSARTAN POTASSIUM 50 MG PO TABS
100.0000 mg | ORAL_TABLET | Freq: Every day | ORAL | Status: DC
  Administered 2016-06-10: 100 mg via ORAL
  Filled 2016-06-10: qty 2

## 2016-06-10 MED ORDER — ONDANSETRON HCL 4 MG/2ML IJ SOLN: 4.0000 mg | Freq: Four times a day (QID) | INTRAMUSCULAR | Status: DC | PRN

## 2016-06-10 MED ORDER — NATEGLINIDE 120 MG PO TABS
120.0000 mg | ORAL_TABLET | Freq: Every day | ORAL | Status: DC
  Filled 2016-06-10: qty 1

## 2016-06-10 MED ORDER — BRIMONIDINE TARTRATE 0.2 % OP SOLN
1.0000 [drp] | Freq: Every day | OPHTHALMIC | Status: DC
  Administered 2016-06-10: 1 [drp] via OPHTHALMIC
  Filled 2016-06-10: qty 5

## 2016-06-10 MED ORDER — PANTOPRAZOLE SODIUM 40 MG PO TBEC
40.0000 mg | DELAYED_RELEASE_TABLET | Freq: Every day | ORAL | Status: DC
  Administered 2016-06-10: 40 mg via ORAL
  Filled 2016-06-10: qty 1

## 2016-06-10 MED ORDER — BISACODYL 5 MG PO TBEC: 5.0000 mg | DELAYED_RELEASE_TABLET | Freq: Every day | ORAL | Status: DC | PRN

## 2016-06-10 MED ORDER — APIXABAN 5 MG PO TABS
5.0000 mg | ORAL_TABLET | Freq: Two times a day (BID) | ORAL | Status: DC
  Administered 2016-06-10 (×2): 5 mg via ORAL
  Filled 2016-06-10 (×2): qty 1

## 2016-06-10 MED ORDER — OMEGA-3-ACID ETHYL ESTERS 1 G PO CAPS
1000.0000 mg | ORAL_CAPSULE | Freq: Every day | ORAL | Status: DC
  Administered 2016-06-10: 1000 mg via ORAL
  Filled 2016-06-10: qty 1

## 2016-06-10 MED ORDER — MOMETASONE FURO-FORMOTEROL FUM 200-5 MCG/ACT IN AERO
2.0000 | INHALATION_SPRAY | Freq: Two times a day (BID) | RESPIRATORY_TRACT | Status: DC
  Administered 2016-06-10 (×2): 2 via RESPIRATORY_TRACT
  Filled 2016-06-10: qty 8.8

## 2016-06-10 MED ORDER — ATORVASTATIN CALCIUM 20 MG PO TABS: 20.0000 mg | ORAL_TABLET | Freq: Every day | ORAL | Status: DC

## 2016-06-10 MED ORDER — ONDANSETRON HCL 4 MG PO TABS: 4.0000 mg | ORAL_TABLET | Freq: Four times a day (QID) | ORAL | Status: DC | PRN

## 2016-06-10 MED ORDER — TIOTROPIUM BROMIDE MONOHYDRATE 18 MCG IN CAPS
18.0000 ug | ORAL_CAPSULE | Freq: Every day | RESPIRATORY_TRACT | Status: DC
  Administered 2016-06-10: 18 ug via RESPIRATORY_TRACT
  Filled 2016-06-10: qty 5

## 2016-06-10 MED ORDER — ACETAMINOPHEN 325 MG PO TABS: 650.0000 mg | ORAL_TABLET | Freq: Four times a day (QID) | ORAL | Status: DC | PRN

## 2016-06-10 MED ORDER — MAGNESIUM CITRATE PO SOLN
1.0000 | Freq: Once | ORAL | Status: DC | PRN
Start: 2016-06-10 — End: 2016-06-10

## 2016-06-10 MED ORDER — SENNOSIDES-DOCUSATE SODIUM 8.6-50 MG PO TABS: 1.0000 | ORAL_TABLET | Freq: Every evening | ORAL | Status: DC | PRN

## 2016-06-10 MED ORDER — LATANOPROST 0.005 % OP SOLN
1.0000 [drp] | Freq: Every day | OPHTHALMIC | Status: DC
  Administered 2016-06-10: 1 [drp] via OPHTHALMIC
  Filled 2016-06-10: qty 2.5

## 2016-06-10 MED ORDER — GABAPENTIN 300 MG PO CAPS
300.0000 mg | ORAL_CAPSULE | Freq: Every day | ORAL | Status: DC
  Administered 2016-06-10: 300 mg via ORAL
  Filled 2016-06-10: qty 1

## 2016-06-10 MED ORDER — ACETAMINOPHEN 650 MG RE SUPP: 650.0000 mg | Freq: Four times a day (QID) | RECTAL | Status: DC | PRN

## 2016-06-10 MED ORDER — ALBUTEROL SULFATE (2.5 MG/3ML) 0.083% IN NEBU: 3.0000 mL | INHALATION_SOLUTION | RESPIRATORY_TRACT | Status: DC | PRN

## 2016-06-10 MED ORDER — SODIUM CHLORIDE 0.9 % IV SOLN
INTRAVENOUS | Status: DC
  Administered 2016-06-10: 01:00:00 via INTRAVENOUS

## 2016-06-10 MED ORDER — INSULIN ASPART 100 UNIT/ML ~~LOC~~ SOLN
0.0000 [IU] | Freq: Three times a day (TID) | SUBCUTANEOUS | Status: DC
  Administered 2016-06-10: 3 [IU] via SUBCUTANEOUS

## 2016-06-10 MED ORDER — INSULIN ASPART 100 UNIT/ML ~~LOC~~ SOLN: 0.0000 [IU] | Freq: Every day | SUBCUTANEOUS | Status: DC

## 2016-06-10 MED ORDER — TERAZOSIN HCL 5 MG PO CAPS: 5.0000 mg | ORAL_CAPSULE | Freq: Every evening | ORAL | Status: DC

## 2016-06-10 MED ORDER — POTASSIUM CHLORIDE CRYS ER 20 MEQ PO TBCR
20.0000 meq | EXTENDED_RELEASE_TABLET | Freq: Every day | ORAL | Status: DC
  Administered 2016-06-10: 20 meq via ORAL
  Filled 2016-06-10: qty 1

## 2016-06-10 NOTE — Accreditation Note (Signed)
Pt to be  Discharged to home today. Iv and tele removed. disch instructions given to pt by lance. dich via w.c. Accompanied by daughter.

## 2016-06-10 NOTE — Discharge Instructions (Signed)
Atrial Fibrillation °Introduction °Atrial fibrillation is a type of heartbeat that is irregular or fast (rapid). If you have this condition, your heart keeps quivering in a weird (chaotic) way. This condition can make it so your heart cannot pump blood normally. Having this condition gives a person more risk for stroke, heart failure, and other heart problems. There are different types of atrial fibrillation. Talk with your doctor to learn about the type that you have. °Follow these instructions at home: °· Take over-the-counter and prescription medicines only as told by your doctor. °· If your doctor prescribed a blood-thinning medicine, take it exactly as told. Taking too much of it can cause bleeding. If you do not take enough of it, you will not have the protection that you need against stroke and other problems. °· Do not use any tobacco products. These include cigarettes, chewing tobacco, and e-cigarettes. If you need help quitting, ask your doctor. °· If you have apnea (obstructive sleep apnea), manage it as told by your doctor. °· Do not drink alcohol. °· Do not drink beverages that have caffeine. These include coffee, soda, and tea. °· Maintain a healthy weight. Do not use diet pills unless your doctor says they are safe for you. Diet pills may make heart problems worse. °· Follow diet instructions as told by your doctor. °· Exercise regularly as told by your doctor. °· Keep all follow-up visits as told by your doctor. This is important. °Contact a doctor if: °· You notice a change in the speed, rhythm, or strength of your heartbeat. °· You are taking a blood-thinning medicine and you notice more bruising. °· You get tired more easily when you move or exercise. °Get help right away if: °· You have pain in your chest or your belly (abdomen). °· You have sweating or weakness. °· You feel sick to your stomach (nauseous). °· You notice blood in your throw up (vomit), poop (stool), or pee (urine). °· You are  short of breath. °· You suddenly have swollen feet and ankles. °· You feel dizzy. °· Your suddenly get weak or numb in your face, arms, or legs, especially if it happens on one side of your body. °· You have trouble talking, trouble understanding, or both. °· Your face or your eyelid droops on one side. °These symptoms may be an emergency. Do not wait to see if the symptoms will go away. Get medical help right away. Call your local emergency services (911 in the [place] Do not drive yourself to the hospital.  °This information is not intended to replace advice given to you by your health care provider. Make sure you discuss any questions you have with your health care provider. °Document Released: 04/13/2008 Document Revised: 12/11/2015 Document Reviewed: 10/30/2014 °© 2017 Elsevier ° °

## 2016-06-10 NOTE — H&P (Signed)
[facility] -  @ [facility] Admission History and Physical Bobby Krueger, D.O.  ---------------------------------------------------------------------------------------------------------------------   PATIENT NAME: Bobby Krueger MR#: [id number] DATE OF BIRTH: [birth year] DATE OF ADMISSION: 06/09/2016 PRIMARY CARE PHYSICIAN: Bobby HeadingKenneth A Fath, MD  REQUESTING/REFERRING PHYSICIAN: ED Dr. Derrill KayGoodman  CHIEF COMPLAINT: Chief Complaint  Patient presents with  . Weakness    HISTORY OF PRESENT ILLNESS: Bobby Krueger Bellissimo is a 82 y.o. male with a known history of Type 2 diabetes mellitus insulin-dependent, atrial fibrillation on eliquis for anticoagulation, congestive heart failure, COPD, hyperlipidemia, hypertension  presents to the emergency department For evaluation of weakness and hypotension. According to the emergency department records patient's daughter brought the patient to the emergency department because he was weaker and more lethargic than usual. Daughter reportedly took his blood pressure at home and found it to be low prompting a call to the ambulance. Patient denies any complaints except for being tired.  Otherwise there has been no change in status. Patient has been taking medication as prescribed and there has been no recent change in medication or diet.  There has been no recent illness, travel or sick contacts.    Patient denies fevers/chills, weakness, dizziness, chest pain, shortness of breath, N/V/C/D, abdominal pain, dysuria/frequency, changes in mental status.   PAST MEDICAL HISTORY: Past Medical History:  Diagnosis Date  . A-fib (HCC)   . Chronic combined systolic (congestive) and diastolic (congestive) heart failure    a. 03/2014: EF 35%, normal nuc in 05/2014 b. echo 11/2015: EF 40-45% w/ diffuse HK and Grade 1 DD  . COPD (chronic obstructive pulmonary disease) (HCC)   . Diabetes mellitus without complication (HCC)   . High cholesterol   . Hypertension        PAST SURGICAL HISTORY: Past Surgical History:  Procedure Laterality Date  . BACK SURGERY    . CARPAL TUNNEL RELEASE    . CHOLECYSTECTOMY        SOCIAL HISTORY: Social History  Substance Use Topics  . Smoking status: Former Games developermoker  . Smokeless tobacco: Never Used  . Alcohol use No      FAMILY HISTORY: Family History  Problem Relation Age of Onset  . Prostate cancer       MEDICATIONS AT HOME: Prior to Admission medications   Medication Sig Start Date End Date Taking? Authorizing Provider  albuterol (PROAIR HFA) 108 (90 Base) MCG/ACT inhaler Inhale 2 puffs into the lungs every 4 (four) hours as needed for wheezing or shortness of breath.    Yes Historical Provider, MD  apixaban (ELIQUIS) 5 MG TABS tablet Take 1 tablet (5 mg total) by mouth 2 (two) times daily. 11/21/15  Yes Haydee SalterPhillip M Hobbs, MD  atorvastatin (LIPITOR) 20 MG tablet Take 1 tablet (20 mg total) by mouth daily at 6 PM. 03/26/16  Yes [name], MD  brimonidine (ALPHAGAN) 0.2 % ophthalmic solution Place 1 drop into both eyes daily.    Yes Historical Provider, MD  carvedilol (COREG) 6.25 MG tablet Take 6.25 mg by mouth 2 (two) times daily with a meal.    Yes Historical Provider, MD  Fluticasone-Salmeterol (ADVAIR DISKUS) 250-50 MCG/DOSE AEPB Inhale 1 puff into the lungs daily as needed (for wheezing or shortness).    Yes Historical Provider, MD  furosemide (LASIX) 40 MG tablet Take 1 tablet (40 mg total) by mouth daily. 02/23/16  Yes [name], NP  gabapentin (NEURONTIN) 300 MG capsule Take 300 mg by mouth at bedtime.   Yes Historical Provider, MD  ibuprofen (  ADVIL,MOTRIN) 400 MG tablet Take 400 mg by mouth every 6 (six) hours as needed for moderate pain.   Yes Historical Provider, MD  insulin glargine (LANTUS) 100 UNIT/ML injection Inject 0.08 mLs (8 Units total) into the skin at bedtime. Patient taking differently: Inject 10 Units into the skin at bedtime.  02/14/16  Yes [name], MD  insulin lispro (HUMALOG)  100 UNIT/ML injection Inject 0-9 Units into the skin 3 (three) times daily before meals. CBG's 70-120 = 0 units; 121-150 =1 units; 151-200 = 2 units; 201-250 =3 units; 251-300 =5 units; 301-350 =7 units; 351-400=9 units and > 400 Notify provider.   Yes Historical Provider, MD  ipratropium-albuterol (DUONEB) 0.5-2.5 (3) MG/3ML SOLN Take 3 mLs by nebulization every 6 (six) hours as needed (for SOB).   Yes Historical Provider, MD  latanoprost (XALATAN) 0.005 % ophthalmic solution Place 1 drop into both eyes at bedtime.   Yes Historical Provider, MD  losartan (COZAAR) 100 MG tablet Take 100 mg by mouth daily.   Yes Historical Provider, MD  nateglinide (STARLIX) 120 MG tablet Take 120 mg by mouth daily. 10/02/15  Yes Historical Provider, MD  Omega-3 1000 MG CAPS Take 1 g by mouth daily.   Yes Historical Provider, MD  omeprazole (PRILOSEC) 20 MG capsule Take 20 mg by mouth daily.   Yes Historical Provider, MD  polyvinyl alcohol (LIQUIFILM TEARS) 1.4 % ophthalmic solution Place 1 drop into both eyes as needed for dry eyes. Reported on 12/21/2015   Yes Historical Provider, MD  potassium chloride SA (K-DUR,KLOR-CON) 20 MEQ tablet Take 20 mEq by mouth daily.   Yes Historical Provider, MD  SPIRIVA HANDIHALER 18 MCG inhalation capsule Place 1 application into inhaler and inhale daily. 10/13/15  Yes Historical Provider, MD  terazosin (HYTRIN) 5 MG capsule Take 5 mg by mouth every evening.   Yes Historical Provider, MD  triamcinolone cream (KENALOG) 0.1 % Apply 1 application topically 2 (two) times daily as needed (for irritation).   Yes Historical Provider, MD  ketorolac (ACULAR) 0.5 % ophthalmic solution Place 1 drop into the right eye 4 (four) times daily. Patient not taking: Reported on 06/09/2016 02/02/16   Bobby Royals Cuthriell, PA-C  losartan (COZAAR) 25 MG tablet Take 1 tablet (25 mg total) by mouth daily. Patient not taking: Reported on 06/09/2016 03/27/16   [name], MD  predniSONE (STERAPRED UNI-PAK 21 TAB)  10 MG (21) TBPK tablet Take 1 tablet (10 mg total) by mouth daily. 40mg  x1 day, 20mg  x2 day, 10mg  x2 day, then stop Patient not taking: Reported on 06/09/2016 02/14/16   [name], MD  trimethoprim-polymyxin b (POLYTRIM) ophthalmic solution Place 2 drops into the right eye every 6 (six) hours. Patient not taking: Reported on 06/09/2016 02/02/16   Bobby Royals Cuthriell, PA-C      DRUG ALLERGIES: Allergies  Allergen Reactions  . Aldactone [Spironolactone] Hives  . Aspirin Hives  . Ciprofloxacin Hives  . Cyclobenzaprine Hives  . Lexapro [Escitalopram] Hives     REVIEW OF SYSTEMS: CONSTITUTIONAL: Positive fatigue, negative weakness, fever, chills, weight gain/loss, headache EYES: No blurry or double vision. ENT: No tinnitus, postnasal drip, redness or soreness of the oropharynx. RESPIRATORY: No dyspnea, cough, wheeze, hemoptysis. CARDIOVASCULAR: No chest pain, orthopnea, palpitations, syncope. GASTROINTESTINAL: No nausea, vomiting, constipation, diarrhea, abdominal pain. No hematemesis, melena or hematochezia. GENITOURINARY: No dysuria, frequency, hematuria. ENDOCRINE: No polyuria or nocturia. No heat or cold intolerance. HEMATOLOGY: No anemia, bruising, bleeding. INTEGUMENTARY: No rashes, ulcers, lesions. MUSCULOSKELETAL: No  pain, arthritis, swelling, gout. NEUROLOGIC: No numbness, tingling, weakness or ataxia. No seizure-type activity. PSYCHIATRIC: No anxiety, depression, insomnia.  PHYSICAL EXAMINATION: VITAL SIGNS: Blood pressure (!) 112/55, pulse 83, temperature 97.9 F (36.6 C), temperature source Oral, resp. rate 18, height 5\' 6"  (1.676 m), weight 87.5 kg (193 lb), SpO2 98 %.  GENERAL: 82 y.o.-year-old white male patient, well-developed, well-nourished lying in the bed in no acute distress.  Pleasant and cooperative.   HEENT: Head atraumatic, normocephalic. Pupils equal, round, reactive to light and accommodation. No scleral icterus. Extraocular muscles intact. Oropharynx  is clear. Mucus membranes moist. NECK: Supple, full range of motion. No JVD, no bruit heard. No cervical lymphadenopathy. CHEST: Normal breath sounds bilaterally. No wheezing, rales, rhonchi or crackles. No use of accessory muscles of respiration.  No reproducible chest wall tenderness.  CARDIOVASCULAR: Irregularly irregular.  No murmurs, rubs, or gallops appreciated. Cap refill <2 seconds. ABDOMEN: Soft, nontender, nondistended. No rebound, guarding, rigidity. Normoactive bowel sounds present in all four quadrants. No organomegaly or mass. EXTREMITIES: Full range of motion. Mild bilateral lower extremity pitting edema. NEUROLOGIC: Cranial nerves II through XII are grossly intact with no focal sensorimotor deficit. Muscle strength 5/5 in all extremities. Sensation intact. Gait not checked. PSYCHIATRIC: The patient is alert and oriented x 3. Normal affect, mood, thought content. SKIN: Warm, dry, and intact without obvious rash, lesion, or ulcer.  LABORATORY PANEL:  CBC  Recent Labs Lab 06/09/16 2107  WBC 8.1  HGB 12.7*  HCT 36.6*  PLT 163   ----------------------------------------------------------------------------------------------------------------- Chemistries  Recent Labs Lab 06/09/16 2107  NA 135  K 4.0  CL 100*  CO2 29  GLUCOSE 223*  BUN 22*  CREATININE 1.19  CALCIUM 8.5*   ------------------------------------------------------------------------------------------------------------------ Cardiac Enzymes  Recent Labs Lab 06/09/16 2107  TROPONINI <0.03   ------------------------------------------------------------------------------------------------------------------  RADIOLOGY: No results found.  EKG: Atrial fibrillation at 81 bpm with left axis deviation, old left bundle branch block and nonspecific ST and T wave changes.  IMPRESSION AND PLAN:  This is a 82 y.o. male with a history of Type 2 diabetes mellitus insulin-dependent, atrial fibrillation on  eliquis for anticoagulation, congestive heart failure, COPD, hyperlipidemia, hypertension  now being admitted with: 1. Weakness and hypotension reported by family. Admit to observation with telemetry monitoring. Hold antihypertensives for low blood pressure. Cautious IV fluids. 2. History of atrial fibrillation-continue Eliquis, Coreg 3. History of hyperlipidemia-continue Lipitor 4. History of COPD-continue nebulizers, Advair, Spiriva 5. History of diabetes-Accu-Cheks with regular insulin sliding scale coverage, continue Starlix and Lantus 6. History of hypertension-continue Cozaar 7. History of CHF-continue Lasix/potassium, Coreg 8. Continue eyedrops, Neurontin, teres Zosyn  Diet/Nutrition: Heart healthy, carb controlled Fluids: IV normal saline DVT Px: Eliquis SCDs and early ambulation Code Status: Full  All the records are reviewed and case discussed with ED provider. Management plans discussed with the patient and/or family who express understanding and agree with plan of care.   TOTAL TIME TAKING CARE OF THIS PATIENT: 60 minutes.   Arna Luis D.O. on 06/10/2016 at 12:00 AM Between 7am to 6pm - Pager - [phone] After 6pm go to [email or web address] - password [email or web address] [facility] [place] [facility] Office [phone] CC: Primary care physician; Bobby Heading, MD     Note: This dictation was prepared with Dragon dictation along with smaller phrase technology. Any transcriptional errors that result from this process are unintentional.

## 2016-06-10 NOTE — Discharge Summary (Signed)
[facility] - [place] [facility]   PATIENT NAME: Bobby Krueger    MR#:  [id number]  DATE OF BIRTH:  Sep 30, 1926  DATE OF ADMISSION:  06/09/2016 ADMITTING PHYSICIAN: [name], DO  DATE OF DISCHARGE: 06/10/2016  1:06 PM  PRIMARY CARE PHYSICIAN: [name], MD    ADMISSION DIAGNOSIS:  Weakness [R53.1] Atrial fibrillation, unspecified type (HCC) [I48.91]  DISCHARGE DIAGNOSIS:  Active Problems:   Atrial fibrillation (HCC)   Weakness   SECONDARY DIAGNOSIS:   Past Medical History:  Diagnosis Date  . A-fib (HCC)   . Chronic combined systolic (congestive) and diastolic (congestive) heart failure    a. 03/2014: EF 35%, normal nuc in 05/2014 b. echo 11/2015: EF 40-45% w/ diffuse HK and Grade 1 DD  . COPD (chronic obstructive pulmonary disease) (HCC)   . Diabetes mellitus without complication (HCC)   . High cholesterol   . Hypertension     HOSPITAL COURSE:   82 year old male with past medical history of chronic afibrillation, combined systolic and diastolic CHF, COPD, diabetes, hypertension, hyperlipidemia presented to the hospital due to generalized weakness.  1. Generalized weakness/hypotension-this was reported by the family although was not observed while the patient was in the hospital. Patient was maintained on his antihypertensives and remained normotensive. -He was observed on telemetry had no acute arrhythmias. His cardiac markers 3 were negative. He clinically feels better and is asymptomatic and is tolerating by mouth well. Next line-she is now being discharged home. Next  2. History of chronic atrial fibrillation-patient will continue his Eliquis and Coreg.  3. Hyperlipidemia-patient will continue his Lipitor.  4.  COPD-patient will continue his Advair and Spiriva. There is no evidence of acute COPD exacerbation on the hospital.  5. DM - pt. Will resume his lantus, Lispro with meals.   6. GERD - pt. Will cont. Protonix.   7. Hx of  CHF - clinically not in CHF while in the hospital.  - pt. Will cont. Lasix, Coreg, Losartan.    DISCHARGE CONDITIONS:   Stable.   CONSULTS OBTAINED:  Treatment Team:  [name], MD Marcina Millard, MD  DRUG ALLERGIES:   Allergies  Allergen Reactions  . Aldactone [Spironolactone] Hives  . Aspirin Hives  . Ciprofloxacin Hives  . Cyclobenzaprine Hives  . Lexapro [Escitalopram] Hives    DISCHARGE MEDICATIONS:     Medication List    STOP taking these medications   ketorolac 0.5 % ophthalmic solution Commonly known as:  ACULAR   predniSONE 10 MG (21) Tbpk tablet Commonly known as:  STERAPRED UNI-PAK 21 TAB     TAKE these medications   ADVAIR DISKUS 250-50 MCG/DOSE Aepb Generic drug:  Fluticasone-Salmeterol Inhale 1 puff into the lungs daily as needed (for wheezing or shortness).   apixaban 5 MG Tabs tablet Commonly known as:  ELIQUIS Take 1 tablet (5 mg total) by mouth 2 (two) times daily.   atorvastatin 20 MG tablet Commonly known as:  LIPITOR Take 1 tablet (20 mg total) by mouth daily at 6 PM.   brimonidine 0.2 % ophthalmic solution Commonly known as:  ALPHAGAN Place 1 drop into both eyes daily.   carvedilol 6.25 MG tablet Commonly known as:  COREG Take 6.25 mg by mouth 2 (two) times daily with a meal.   furosemide 40 MG tablet Commonly known as:  LASIX Take 1 tablet (40 mg total) by mouth daily.   gabapentin 300 MG capsule Commonly known as:  NEURONTIN Take 300 mg by mouth at bedtime.  ibuprofen 400 MG tablet Commonly known as:  ADVIL,MOTRIN Take 400 mg by mouth every 6 (six) hours as needed for moderate pain.   insulin glargine 100 UNIT/ML injection Commonly known as:  LANTUS Inject 0.08 mLs (8 Units total) into the skin at bedtime. What changed:  how much to take   insulin lispro 100 UNIT/ML injection Commonly known as:  HUMALOG Inject 0-9 Units into the skin 3 (three) times daily before meals. CBG's 70-120 = 0 units; 121-150 =1  units; 151-200 = 2 units; 201-250 =3 units; 251-300 =5 units; 301-350 =7 units; 351-400=9 units and > 400 Notify provider.   ipratropium-albuterol 0.5-2.5 (3) MG/3ML Soln Commonly known as:  DUONEB Take 3 mLs by nebulization every 6 (six) hours as needed (for SOB).   latanoprost 0.005 % ophthalmic solution Commonly known as:  XALATAN Place 1 drop into both eyes at bedtime.   losartan 100 MG tablet Commonly known as:  COZAAR Take 100 mg by mouth daily. What changed:  Another medication with the same name was removed. Continue taking this medication, and follow the directions you see here.   nateglinide 120 MG tablet Commonly known as:  STARLIX Take 120 mg by mouth daily.   Omega-3 1000 MG Caps Take 1 g by mouth daily.   omeprazole 20 MG capsule Commonly known as:  PRILOSEC Take 20 mg by mouth daily.   polyvinyl alcohol 1.4 % ophthalmic solution Commonly known as:  LIQUIFILM TEARS Place 1 drop into both eyes as needed for dry eyes. Reported on 12/21/2015   potassium chloride SA 20 MEQ tablet Commonly known as:  K-DUR,KLOR-CON Take 20 mEq by mouth daily.   PROAIR HFA 108 (90 Base) MCG/ACT inhaler Generic drug:  albuterol Inhale 2 puffs into the lungs every 4 (four) hours as needed for wheezing or shortness of breath.   SPIRIVA HANDIHALER 18 MCG inhalation capsule Generic drug:  tiotropium Place 1 application into inhaler and inhale daily.   terazosin 5 MG capsule Commonly known as:  HYTRIN Take 5 mg by mouth every evening.   triamcinolone cream 0.1 % Commonly known as:  KENALOG Apply 1 application topically 2 (two) times daily as needed (for irritation).   trimethoprim-polymyxin b ophthalmic solution Commonly known as:  POLYTRIM Place 2 drops into the right eye every 6 (six) hours.         DISCHARGE INSTRUCTIONS:   DIET:  Cardiac diet, Carb control  DISCHARGE CONDITION:  Stable  ACTIVITY:  Activity as tolerated  OXYGEN:  Home Oxygen: No.   Oxygen  Delivery: room air  DISCHARGE LOCATION:  home   If you experience worsening of your admission symptoms, develop shortness of breath, life threatening emergency, suicidal or homicidal thoughts you must seek medical attention immediately by calling 911 or calling your MD immediately  if symptoms less severe.  You Must read complete instructions/literature along with all the possible adverse reactions/side effects for all the Medicines you take and that have been prescribed to you. Take any new Medicines after you have completely understood and accpet all the possible adverse reactions/side effects.   Please note  You were cared for by a hospitalist during your hospital stay. If you have any questions about your discharge medications or the care you received while you were in the hospital after you are discharged, you can call the unit and asked to speak with the hospitalist on call if the hospitalist that took care of you is not available. Once you are discharged, your primary care  physician will handle any further medical issues. Please note that NO REFILLS for any discharge medications will be authorized once you are discharged, as it is imperative that you return to your primary care physician (or establish a relationship with a primary care physician if you do not have one) for your aftercare needs so that they can reassess your need for medications and monitor your lab values.     Today   No acute events overnight.  No complaints. Weakness improved.  No longer Hypotensive.   VITAL SIGNS:  Blood pressure 140/65, pulse 77, temperature 97.9 F (36.6 C), temperature source Oral, resp. rate 18, height 5\' 6"  (1.676 m), weight 89 kg (196 lb 4.8 oz), SpO2 98 %.  I/O:   Intake/Output Summary (Last 24 hours) at 06/10/16 1417 Last data filed at 06/10/16 0634  Gross per 24 hour  Intake            206.6 ml  Output              625 ml  Net           -418.4 ml    PHYSICAL EXAMINATION:   GENERAL:  82 y.o.-year-old patient lying in the bed with no acute distress.  EYES: Pupils equal, round, reactive to light and accommodation. No scleral icterus. Extraocular muscles intact.  HEENT: Head atraumatic, normocephalic. Oropharynx and nasopharynx clear.  NECK:  Supple, no jugular venous distention. No thyroid enlargement, no tenderness.  LUNGS: Normal breath sounds bilaterally, no wheezing, rales,rhonchi. No use of accessory muscles of respiration.  CARDIOVASCULAR: S1, S2 normal. No murmurs, rubs, or gallops.  ABDOMEN: Soft, non-tender, non-distended. Bowel sounds present. No organomegaly or mass.  EXTREMITIES: No pedal edema, cyanosis, or clubbing.  NEUROLOGIC: Cranial nerves II through XII are intact. No focal motor or sensory defecits b/l.  PSYCHIATRIC: The patient is alert and oriented x 3. Good affect.  SKIN: No obvious rash, lesion, or ulcer.   DATA REVIEW:   CBC  Recent Labs Lab 06/10/16 0329  WBC 7.3  HGB 12.3*  HCT 35.9*  PLT 154    Chemistries   Recent Labs Lab 06/10/16 0329  NA 137  K 3.8  CL 102  CO2 31  GLUCOSE 152*  BUN 20  CREATININE 1.08  CALCIUM 8.6*    Cardiac Enzymes  Recent Labs Lab 06/10/16 0842  TROPONINI <0.03    RADIOLOGY:  No results found.    Management plans discussed with the patient, family and they are in agreement.  CODE STATUS:     Code Status Orders        Start     Ordered   06/10/16 0058  Full code  Continuous     06/10/16 0057    Code Status History    Date Active Date Inactive Code Status Order ID Comments User Context   03/22/2016  9:33 PM 03/27/2016 12:39 AM Full Code [id number]  Lorretta HarpXilin Niu, MD ED   02/10/2016  6:03 AM 02/14/2016  5:29 PM Full Code [id number]  Ihor AustinPavan Pyreddy, MD Inpatient   11/16/2015  5:20 AM 11/21/2015  7:37 PM Full Code [id number]  Clydie Braunondell A Smith, MD ED    Advance Directive Documentation   Flowsheet Row Most Recent Value  Type of Advance Directive  Living will  Pre-existing out of  facility DNR order (yellow form or pink MOST form)  No data  "MOST" Form in Place?  No data      TOTAL TIME TAKING CARE OF THIS PATIENT:  40 minutes.    Houston Siren M.D on 06/10/2016 at 2:17 PM  Between 7am to 6pm - Pager - [phone]  After 6pm go to [email or web address] - Social research officer, government  [employer] [place] [facility]  Office  [phone]  CC: Primary care physician; [name], MD

## 2016-06-10 NOTE — Consult Note (Signed)
[facility] Cardiology  CARDIOLOGY CONSULT NOTE  Patient ID: Bobby Krueger MRN: [id number] DOB/AGE: 1927-05-13 82 y.o.  Admit date: 06/09/2016 Referring Physician [facility] Primary Physician  Primary Cardiologist Fath Reason for Consultation Atrial fibrillation  HPI: 82 year old gentleman referred for evaluation of atrial fibrillation. The patient has known chronic atrial fibrillation currently on Eliquis for stroke prevention. The patient was in his usual state of health, was noted by his daughter to be weak with low blood pressure and was brought to [facility] emergency room. The patient was admitted to telemetry where he has received IV fluid repletion. The patient denies chest pain. He has mild chronic exertional dyspnea due to underlying COPD. He denies palpitations or heart racing. He denies peripheral edema. ECG reveals atrial fibrillation with left bundle branch block with controlled ventricular rate. The patient has ruled out for myocardial infarction with negative troponin 2.  Review of systems complete and found to be negative unless listed above     Past Medical History:  Diagnosis Date  . A-fib (HCC)   . Chronic combined systolic (congestive) and diastolic (congestive) heart failure    a. 03/2014: EF 35%, normal nuc in 05/2014 b. echo 11/2015: EF 40-45% w/ diffuse HK and Grade 1 DD  . COPD (chronic obstructive pulmonary disease) (HCC)   . Diabetes mellitus without complication (HCC)   . High cholesterol   . Hypertension     Past Surgical History:  Procedure Laterality Date  . BACK SURGERY    . CARPAL TUNNEL RELEASE    . CHOLECYSTECTOMY      Prescriptions Prior to Admission  Medication Sig Dispense Refill Last Dose  . albuterol (PROAIR HFA) 108 (90 Base) MCG/ACT inhaler Inhale 2 puffs into the lungs every 4 (four) hours as needed for wheezing or shortness of breath.    Past Month at Unknown time  . apixaban (ELIQUIS) 5 MG TABS tablet Take 1 tablet (5 mg total) by mouth 2 (two)  times daily. 60 tablet 0 06/09/2016 at Unknown time  . atorvastatin (LIPITOR) 20 MG tablet Take 1 tablet (20 mg total) by mouth daily at 6 PM.   06/09/2016 at Unknown time  . brimonidine (ALPHAGAN) 0.2 % ophthalmic solution Place 1 drop into both eyes daily.    06/09/2016 at Unknown time  . carvedilol (COREG) 6.25 MG tablet Take 6.25 mg by mouth 2 (two) times daily with a meal.    06/09/2016 at Unknown time  . Fluticasone-Salmeterol (ADVAIR DISKUS) 250-50 MCG/DOSE AEPB Inhale 1 puff into the lungs daily as needed (for wheezing or shortness).    06/09/2016 at Unknown time  . furosemide (LASIX) 40 MG tablet Take 1 tablet (40 mg total) by mouth daily. 30 tablet 0 06/09/2016 at Unknown time  . gabapentin (NEURONTIN) 300 MG capsule Take 300 mg by mouth at bedtime.   06/08/2016 at Unknown time  . ibuprofen (ADVIL,MOTRIN) 400 MG tablet Take 400 mg by mouth every 6 (six) hours as needed for moderate pain.   Past Week at Unknown time  . insulin glargine (LANTUS) 100 UNIT/ML injection Inject 0.08 mLs (8 Units total) into the skin at bedtime. (Patient taking differently: Inject 10 Units into the skin at bedtime. ) 10 mL 0 03/21/2016 at Unknown time  . insulin lispro (HUMALOG) 100 UNIT/ML injection Inject 0-9 Units into the skin 3 (three) times daily before meals. CBG's 70-120 = 0 units; 121-150 =1 units; 151-200 = 2 units; 201-250 =3 units; 251-300 =5 units; 301-350 =7 units; 351-400=9 units and > 400  Notify provider.   unknown at Unknown time  . ipratropium-albuterol (DUONEB) 0.5-2.5 (3) MG/3ML SOLN Take 3 mLs by nebulization every 6 (six) hours as needed (for SOB).   unknown at unknown  . latanoprost (XALATAN) 0.005 % ophthalmic solution Place 1 drop into both eyes at bedtime.   06/08/2016 at Unknown time  . losartan (COZAAR) 100 MG tablet Take 100 mg by mouth daily.   06/09/2016 at Unknown time  . nateglinide (STARLIX) 120 MG tablet Take 120 mg by mouth daily.   06/09/2016 at Unknown time  . Omega-3 1000 MG CAPS  Take 1 g by mouth daily.   06/09/2016 at Unknown time  . omeprazole (PRILOSEC) 20 MG capsule Take 20 mg by mouth daily.   06/09/2016 at Unknown time  . polyvinyl alcohol (LIQUIFILM TEARS) 1.4 % ophthalmic solution Place 1 drop into both eyes as needed for dry eyes. Reported on 12/21/2015   03/22/2016 at Unknown time  . potassium chloride SA (K-DUR,KLOR-CON) 20 MEQ tablet Take 20 mEq by mouth daily.   06/09/2016 at Unknown time  . SPIRIVA HANDIHALER 18 MCG inhalation capsule Place 1 application into inhaler and inhale daily.   06/09/2016 at Unknown time  . terazosin (HYTRIN) 5 MG capsule Take 5 mg by mouth every evening.   06/09/2016 at Unknown time  . triamcinolone cream (KENALOG) 0.1 % Apply 1 application topically 2 (two) times daily as needed (for irritation).   03/22/2016 at Unknown time  . ketorolac (ACULAR) 0.5 % ophthalmic solution Place 1 drop into the right eye 4 (four) times daily. (Patient not taking: Reported on 06/09/2016) 5 mL 0 Not Taking at Unknown time  . losartan (COZAAR) 25 MG tablet Take 1 tablet (25 mg total) by mouth daily. (Patient not taking: Reported on 06/09/2016)   Not Taking at Unknown time  . predniSONE (STERAPRED UNI-PAK 21 TAB) 10 MG (21) TBPK tablet Take 1 tablet (10 mg total) by mouth daily. 40mg  x1 day, 20mg  x2 day, 10mg  x2 day, then stop (Patient not taking: Reported on 06/09/2016) 10 tablet 0 Completed Course at Unknown time  . trimethoprim-polymyxin b (POLYTRIM) ophthalmic solution Place 2 drops into the right eye every 6 (six) hours. (Patient not taking: Reported on 06/09/2016) 10 mL 0 Not Taking at Unknown time   Social History   Social History  . Marital status: Widowed    Spouse name: N/A  . Number of children: N/A  . Years of education: N/A   Occupational History  . retired    Social History Main Topics  . Smoking status: Former Games developer  . Smokeless tobacco: Never Used  . Alcohol use No  . Drug use: No  . Sexual activity: Not on file   Other Topics  Concern  . Not on file   Social History Narrative  . No narrative on file    Family History  Problem Relation Age of Onset  . Prostate cancer        Review of systems complete and found to be negative unless listed above      PHYSICAL EXAM  General: Well developed, well nourished, in no acute distress HEENT:  Normocephalic and atramatic Neck:  No JVD.  Lungs: Clear bilaterally to auscultation and percussion. Heart: HRRR . Normal S1 and S2 without gallops or murmurs.  Abdomen: Bowel sounds are positive, abdomen soft and non-tender  Msk:  Back normal, normal gait. Normal strength and tone for age. Extremities: No clubbing, cyanosis or edema.   Neuro: Alert and oriented  X 3. Psych:  Good affect, responds appropriately  Labs:   Lab Results  Component Value Date   WBC 7.3 06/10/2016   HGB 12.3 (L) 06/10/2016   HCT 35.9 (L) 06/10/2016   MCV 87.8 06/10/2016   PLT 154 06/10/2016    Recent Labs Lab 06/10/16 0329  NA 137  K 3.8  CL 102  CO2 31  BUN 20  CREATININE 1.08  CALCIUM 8.6*  GLUCOSE 152*   Lab Results  Component Value Date   CKTOTAL 76 06/05/2013   CKMB 0.9 06/05/2013   TROPONINI <0.03 06/10/2016    Lab Results  Component Value Date   CHOL 111 03/23/2016   CHOL 89 06/06/2013   Lab Results  Component Value Date   HDL 37 (L) 03/23/2016   HDL 37 (L) 06/06/2013   Lab Results  Component Value Date   LDLCALC 52 03/23/2016   LDLCALC 33 06/06/2013   Lab Results  Component Value Date   TRIG 108 03/23/2016   TRIG 95 06/06/2013   Lab Results  Component Value Date   CHOLHDL 3.0 03/23/2016   No results found for: LDLDIRECT    Radiology: No results found.  EKG: Atrial fibrillation, left bundle branch block  ASSESSMENT AND PLAN:   1. Chronic atrial fibrillation, with baseline left bundle branch block, controlled ventricular rate, on Eliquis for stroke prevention 2. Chronic systolic congestive heart failure, stable 3.  COPD  Recommendations  1. Agree with current therapy 2. Continue Eliquis for stroke prevention 3. Resume anti-hypertensives as tolerated 4. Defer further cardiac diagnostics at this time   Signed: Marcina Millardlexander Kaleya Douse MD,PhD, [facility] 06/10/2016, 9:02 AM

## 2016-09-17 IMAGING — DX DG CHEST 2V
2 series · 2 of 2 positions shown · non-contrast
Comparison: November 15, 2015

CLINICAL DATA: Abdominal pain and weakness.

EXAM:
CHEST  2 VIEW

[w chest lat]
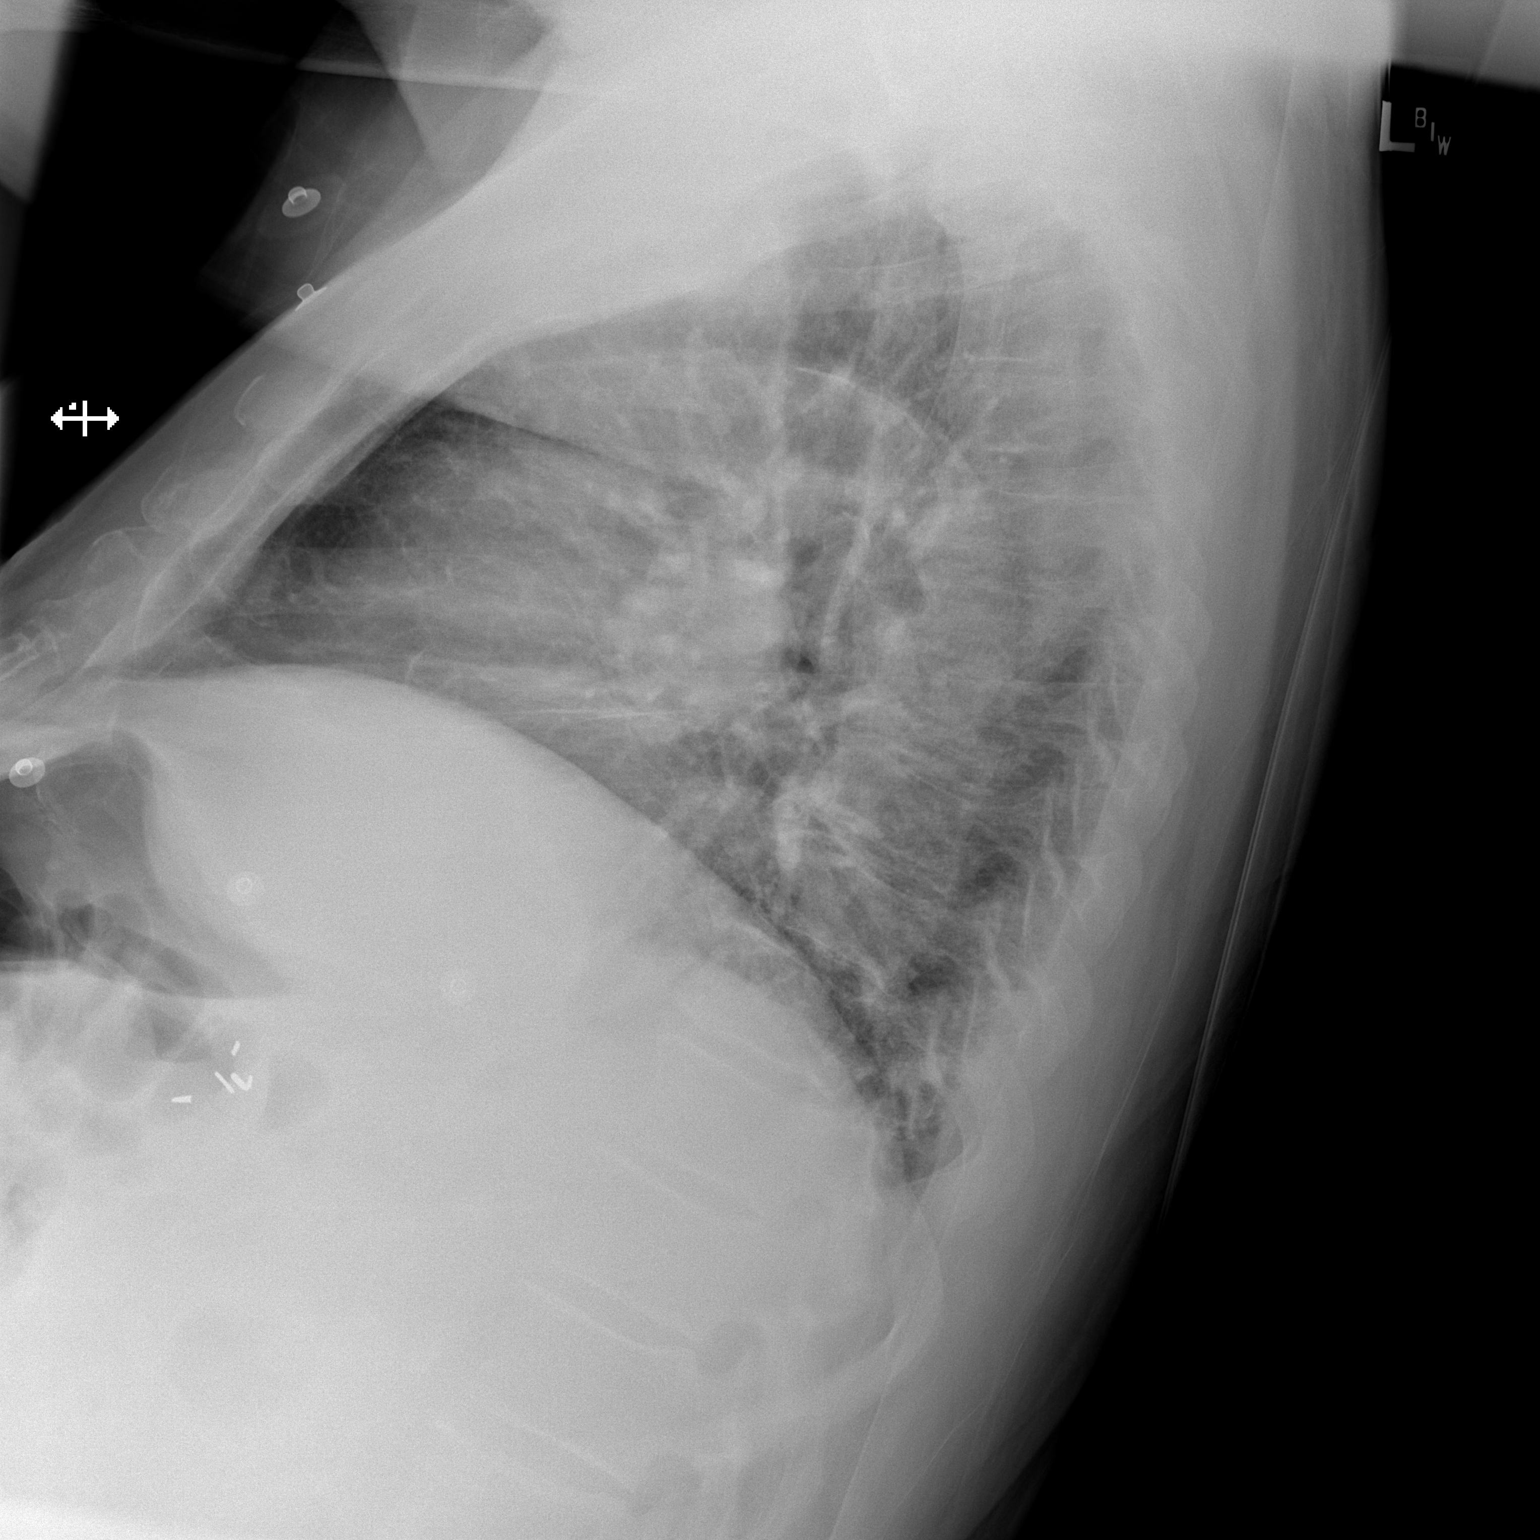

[x chest ap]
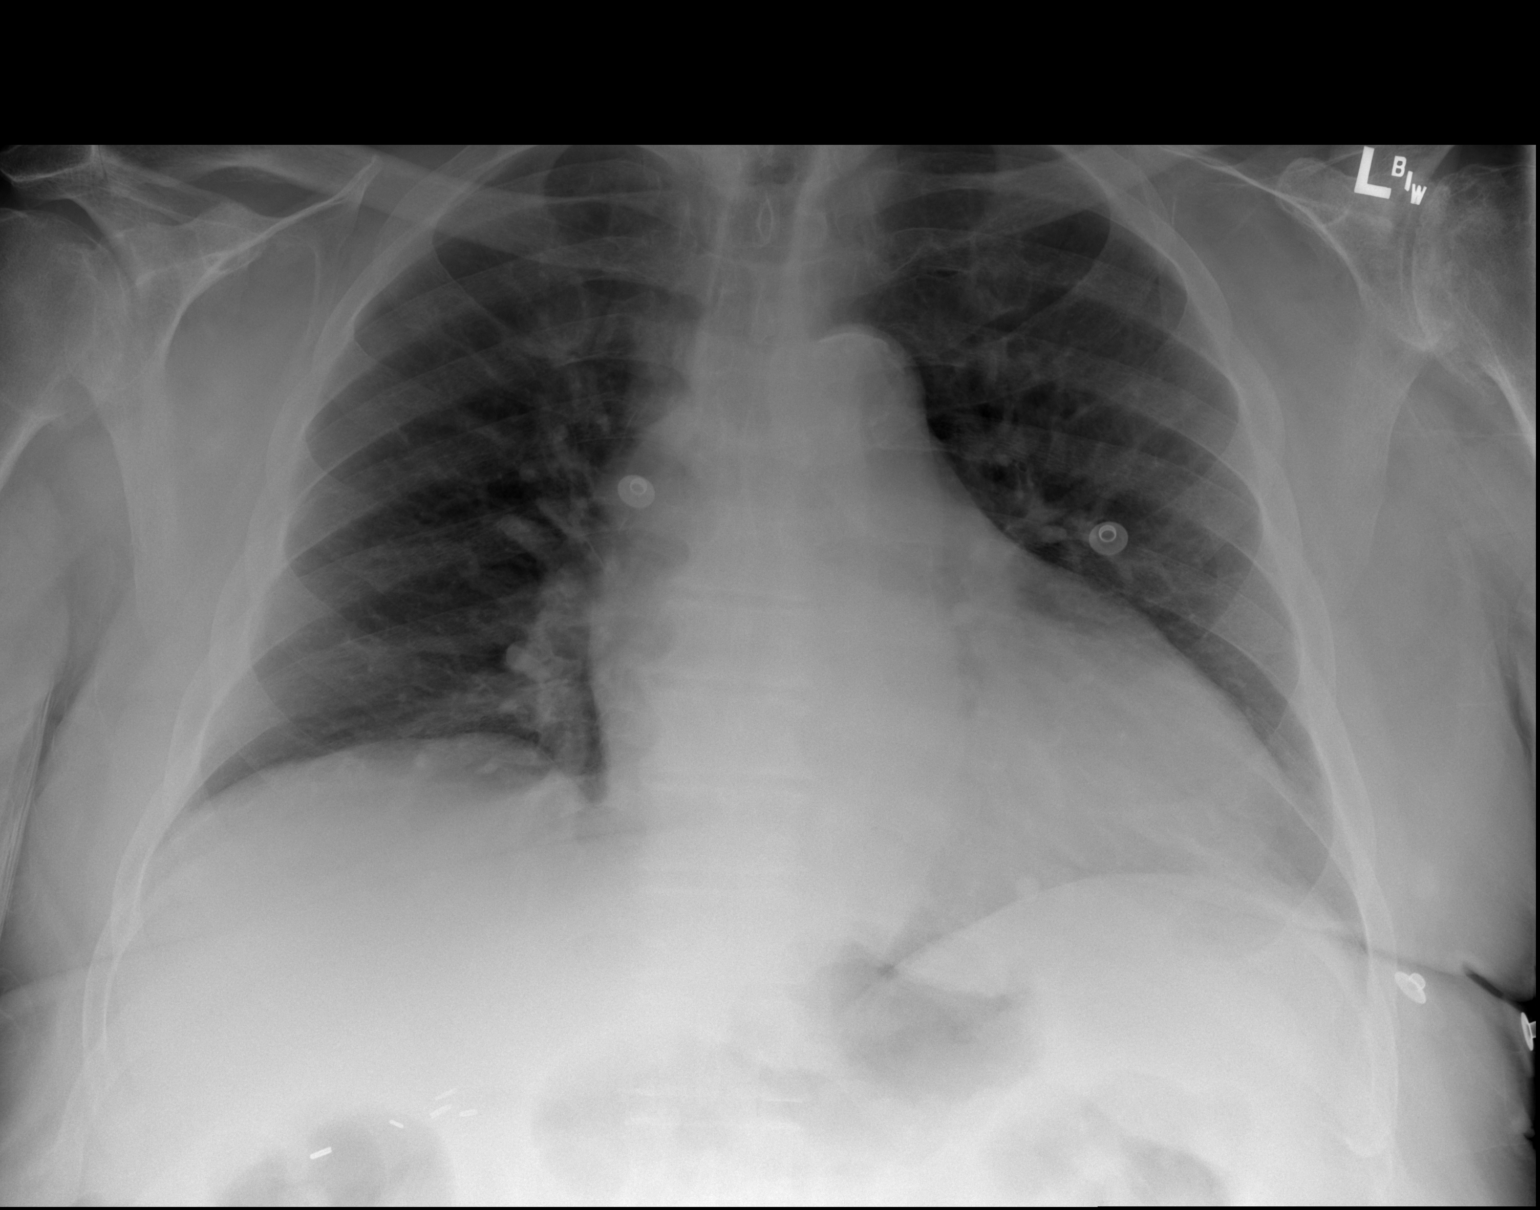

[2 of 2 positions shown; findings below may reference images not displayed]

FINDINGS: Stable cardiomegaly. The hila and the mediastinum are normal. No
pneumothorax. No pulmonary nodules or masses. No focal infiltrates.
IMPRESSION: No active cardiopulmonary disease.

## 2016-11-06 IMAGING — DX DG CHEST 1V PORT
1 series · 1 of 1 positions shown · non-contrast
Comparison: 02/07/2016

CLINICAL DATA: Unresponsive

EXAM:
PORTABLE CHEST 1 VIEW

[chest ap]
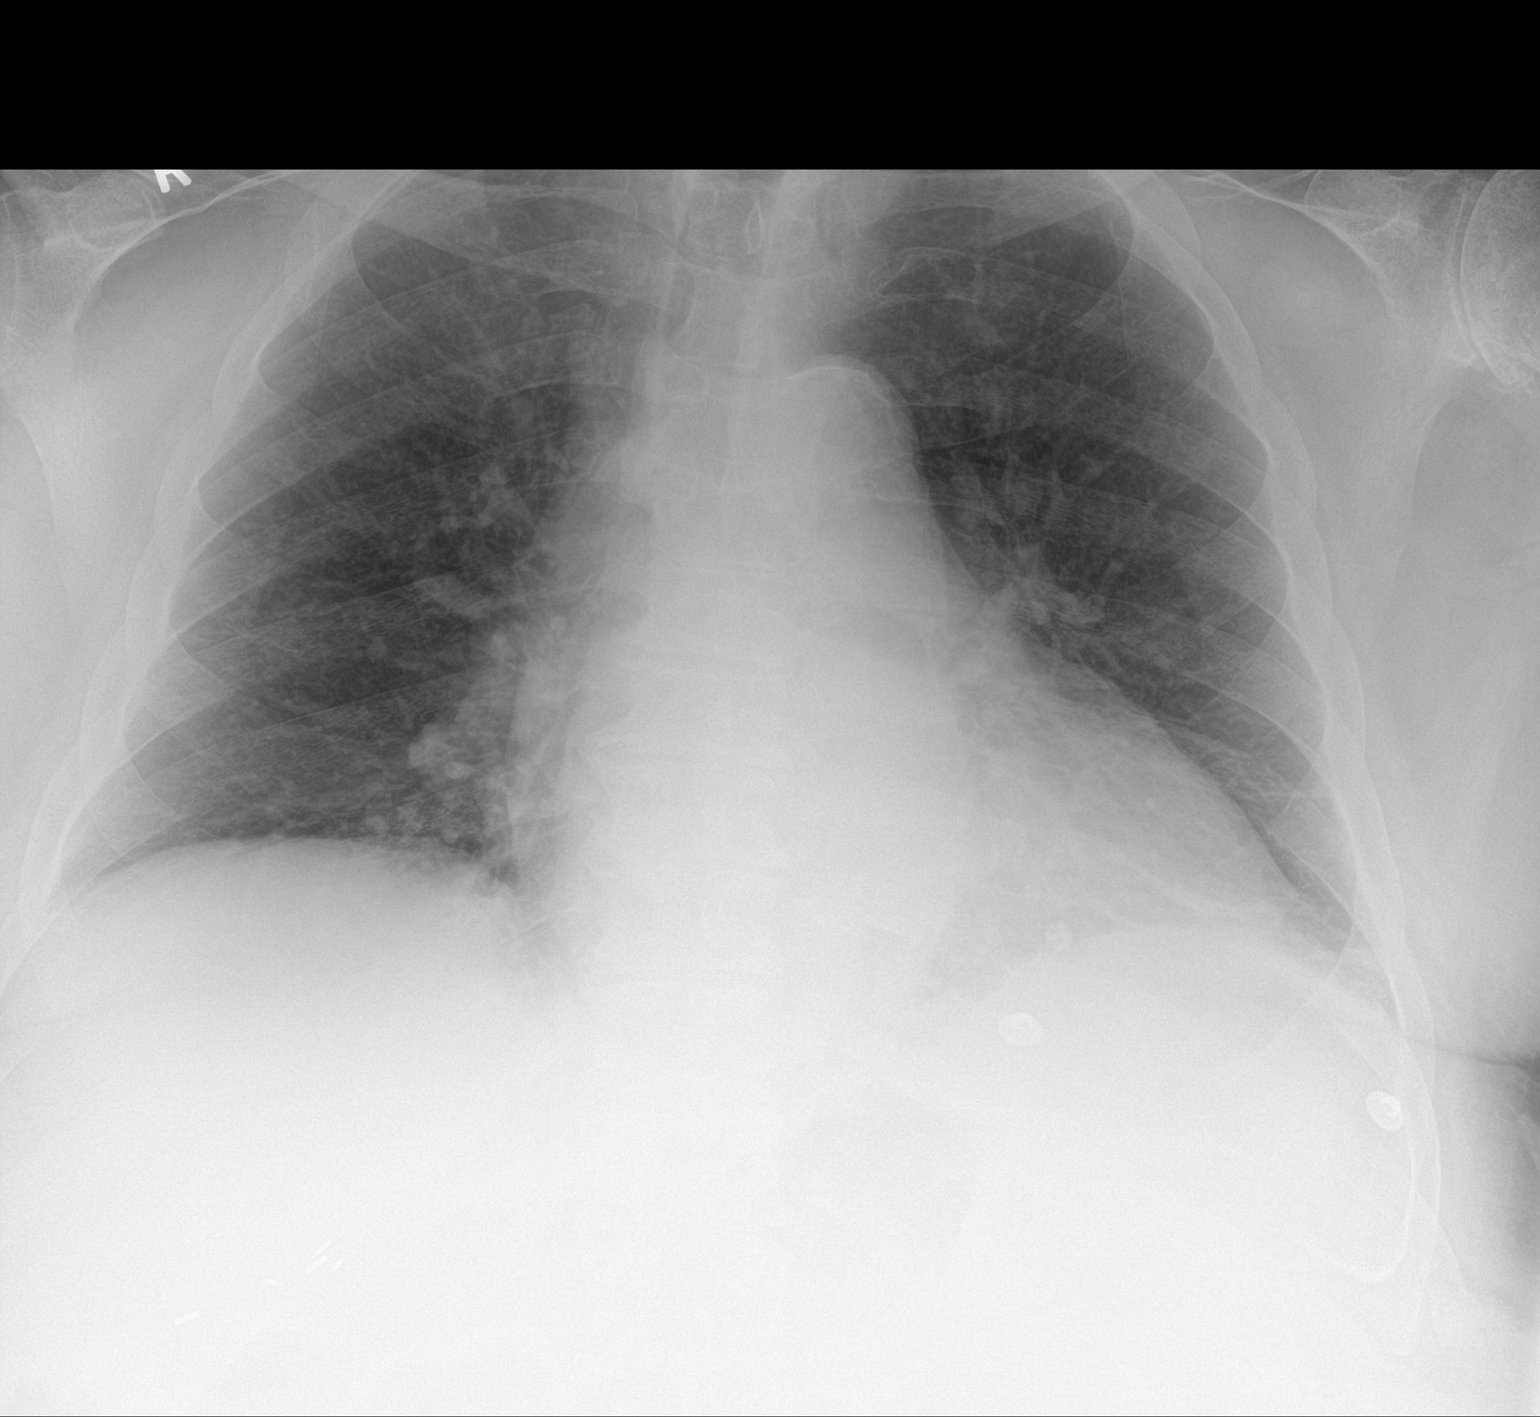

[1 of 1 positions shown; findings below may reference images not displayed]

FINDINGS: Cardiac shadow remains enlarged. The lungs are again well aerated
without focal infiltrate or sizable effusion. Minimal left basilar
atelectasis is noted. Postsurgical changes in the cervical spine are
seen. Aortic calcifications are again noted.
IMPRESSION: Minimal left basilar atelectasis.

Aortic atherosclerosis

## 2016-11-08 IMAGING — CT CT HEAD W/O CM
3 series · 15 of 47 positions shown, 18 images · non-contrast
Comparison: 02/10/2016 and 11/16/2015

CLINICAL DATA: Left leg weakness beginning this morning with right
toe pain. Dizziness.

EXAM:
CT HEAD WITHOUT CONTRAST
TECHNIQUE: Contiguous axial images were obtained from the base of the skull
through the vertex without intravenous contrast.

[Series 2: head wo · axial · 0.42mm/px · z∈[+428,+552]mm · 9 of 30 slices shown, 12 images]
[im 3/30  brain]
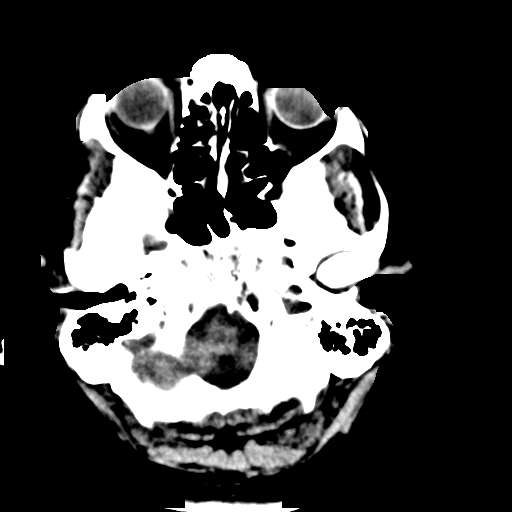
[im 3/30  bone]
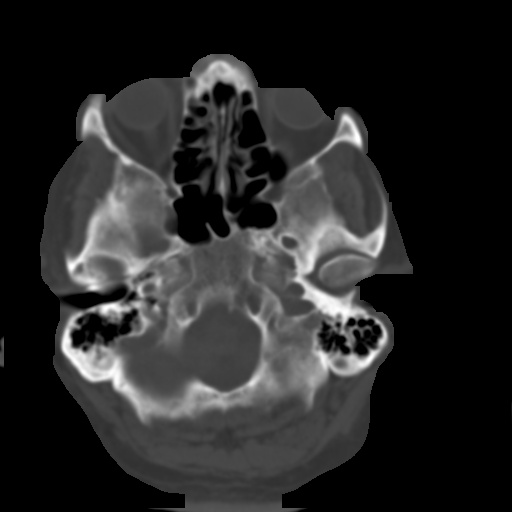
[im 6/30  brain]
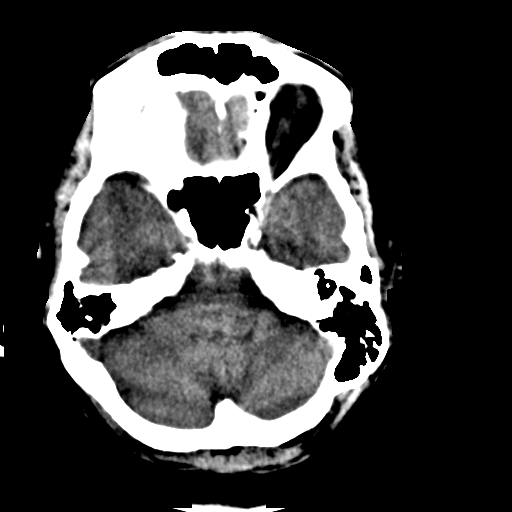
[im 9/30  brain]
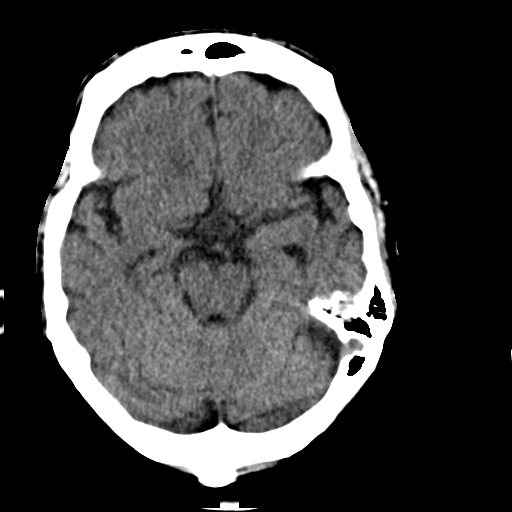
[im 12/30  brain]
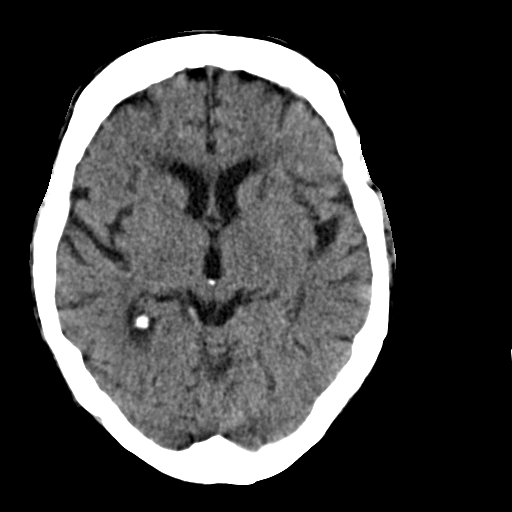
[im 16/30  brain]
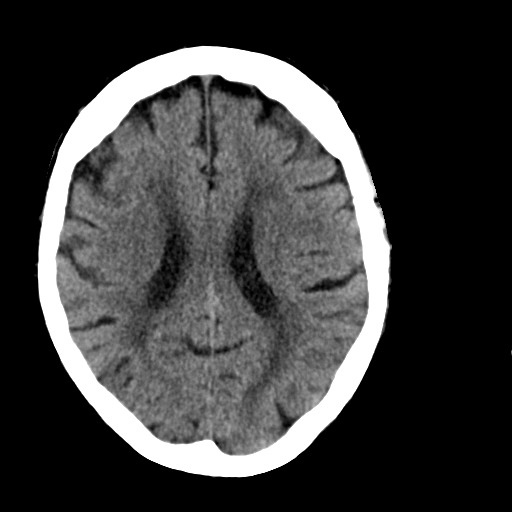
[im 16/30  bone]
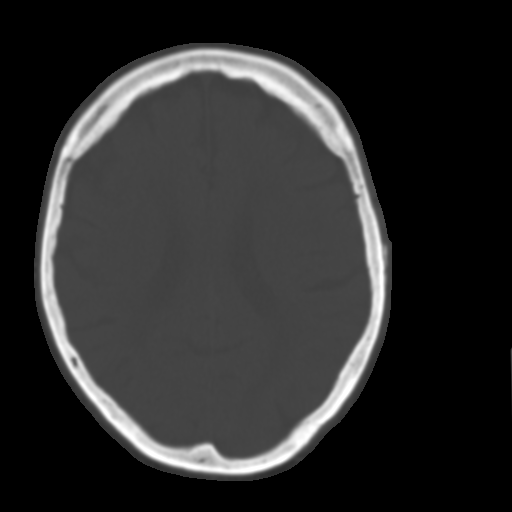
[im 19/30  brain]
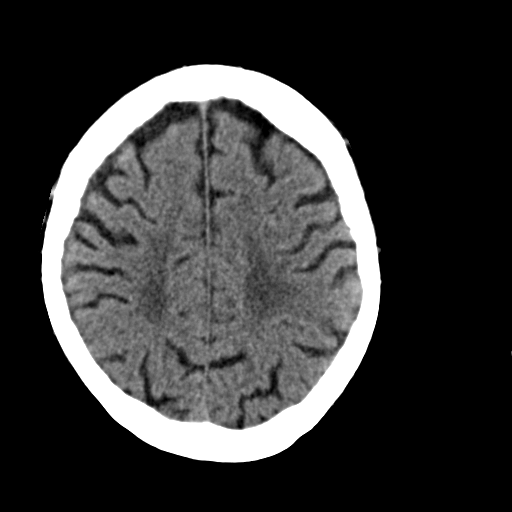
[im 22/30  brain]
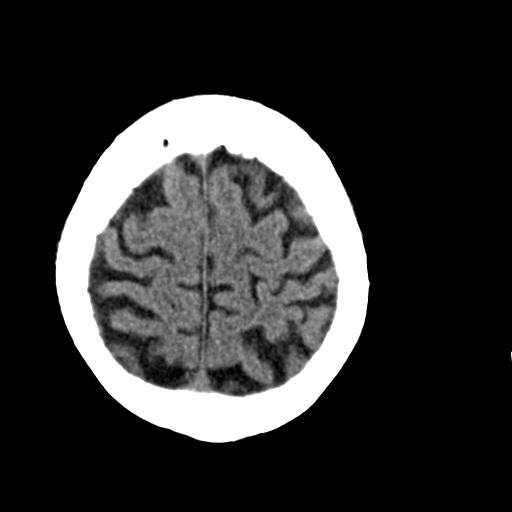
[im 25/30  brain]
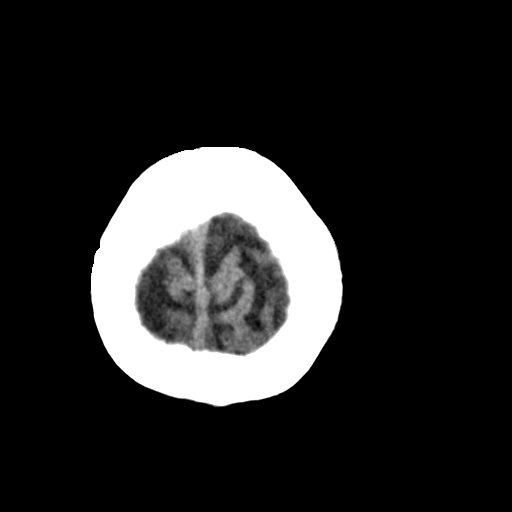
[im 28/30  brain]
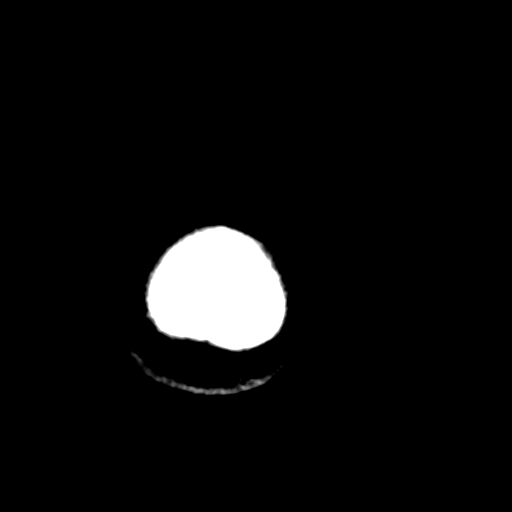
[im 28/30  bone]
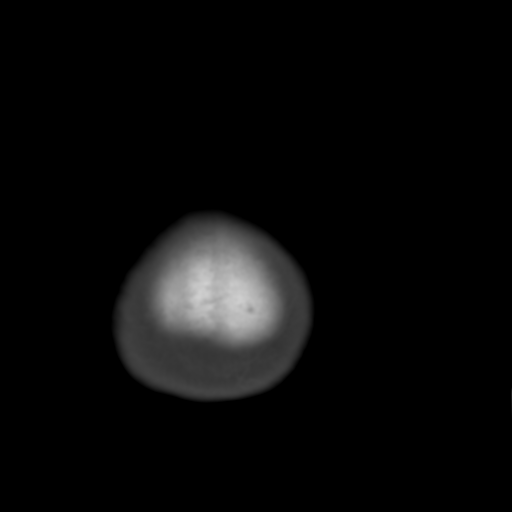

[Series 4: coronal soft · coronal · 0.31mm/px · 3 of 65 slices shown]
[im 22/65  brain]
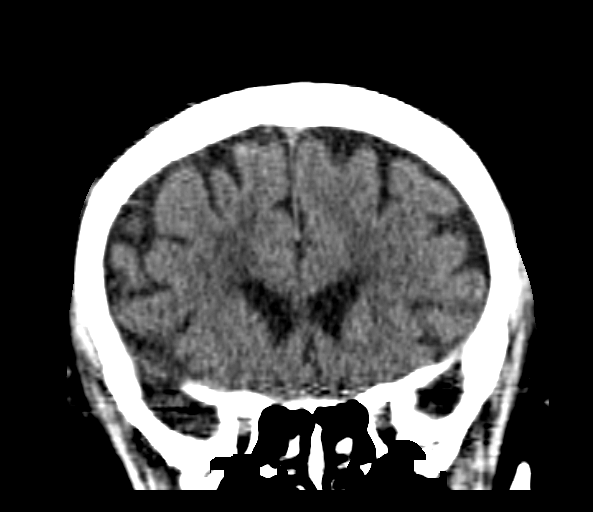
[im 29/65  brain]
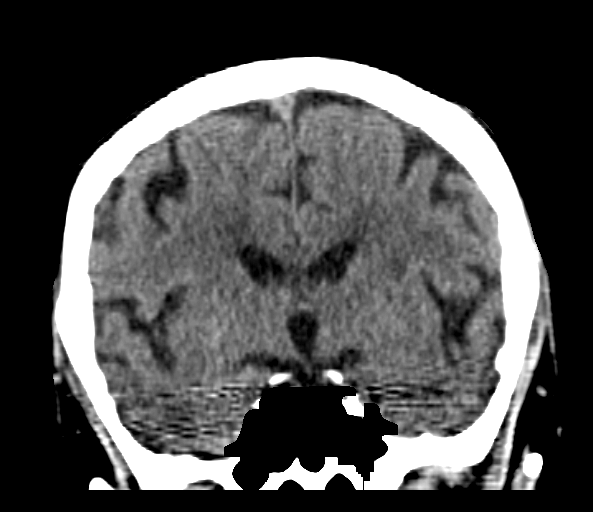
[im 36/65  brain]
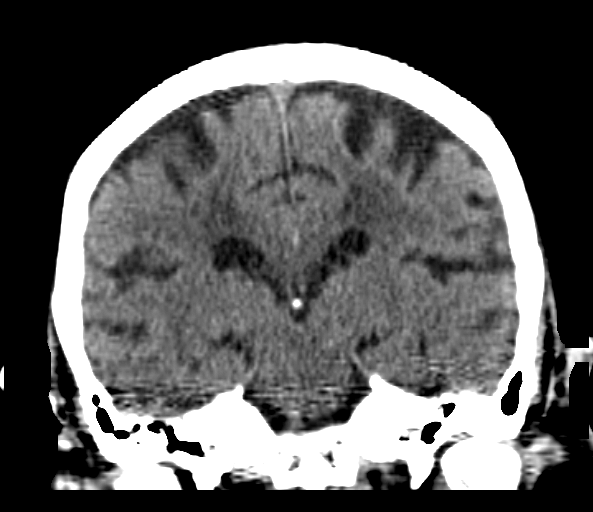

[Series 5: sagittal soft · sagittal · 0.29mm/px · 3 of 54 slices shown]
[im 18/54  brain]
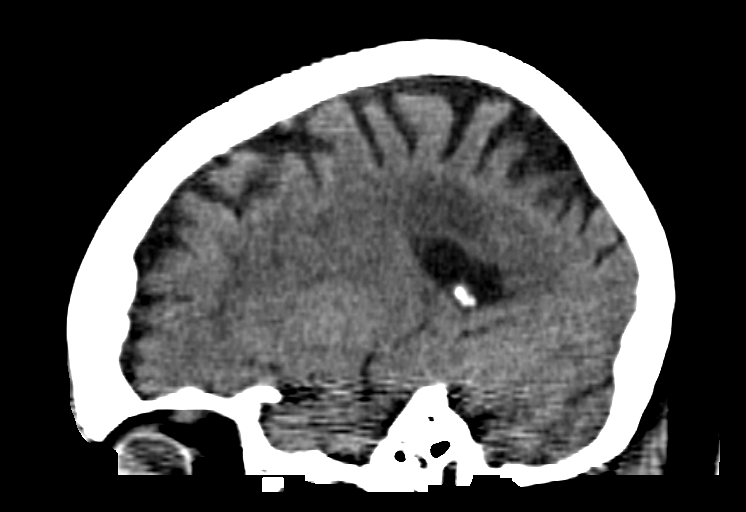
[im 27/54  brain]
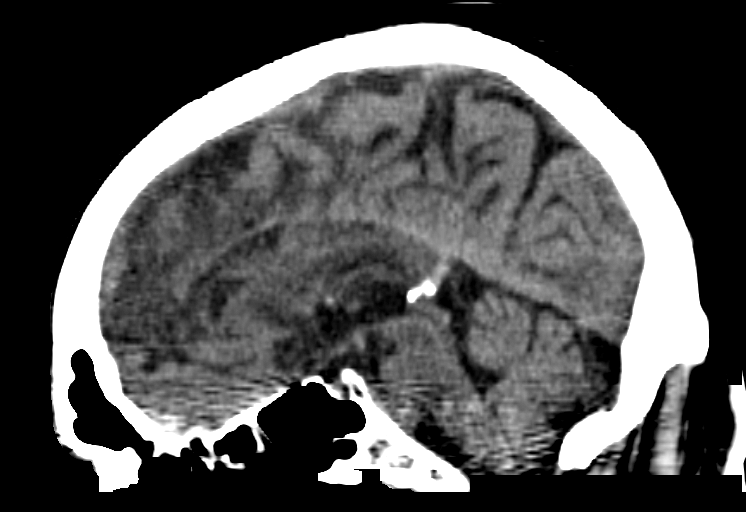
[im 36/54  brain]
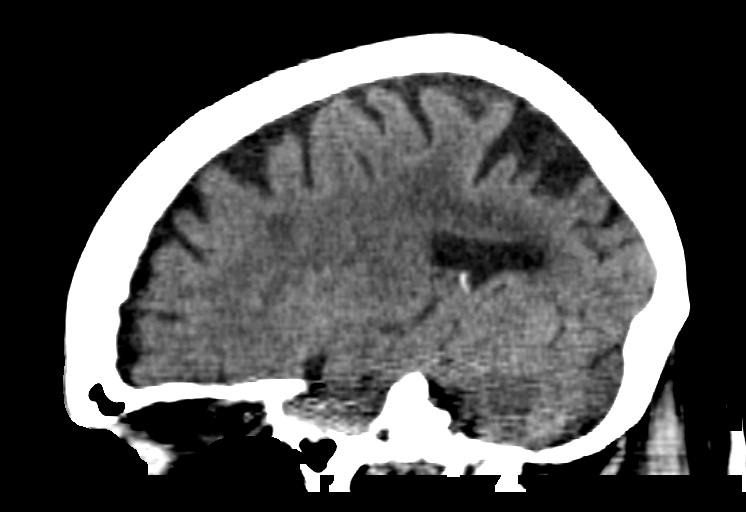

[15 of 47 positions shown; findings below may reference images not displayed]

FINDINGS: Ventricles, cisterns and other CSF spaces are within normal. There
is no mass, mass effect, shift of midline structures or acute
hemorrhage. There is evidence of chronic ischemic microvascular
disease. No evidence of acute infarction. Remaining bones and soft
tissues are within normal.
IMPRESSION: No acute intracranial findings.

Chronic ischemic microvascular disease.

## 2016-12-19 IMAGING — CT CT ABD-PELV W/ CM
2 of 5 series · 15 of 46 positions shown, 17 images · IV contrast (Omni 300)
Comparison: CT abdomen and pelvis with contrast 12/21/2015

CLINICAL DATA: 89-year-old patient with epigastric pain for several
months. [REDACTED]ing. History of cholecystectomy.

EXAM:
CT ABDOMEN AND PELVIS WITH CONTRAST
TECHNIQUE: Multidetector CT imaging of the abdomen and pelvis was performed
using the standard protocol following bolus administration of
intravenous contrast.
CONTRAST:  100mL XBLH9I-XCC IOPAMIDOL (XBLH9I-XCC) INJECTION 61%

[Series 2: a/p w/ 5mm · axial · 0.96mm/px · z∈[-480,-34]mm · 12 of 101 slices shown, 14 images]
[im 6/101  soft-tissue]
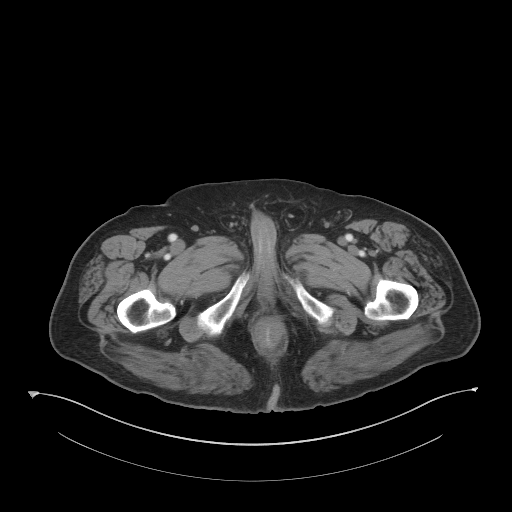
[im 6/101  bone]
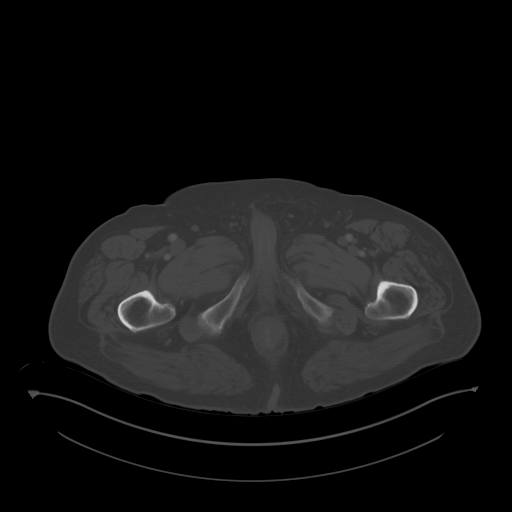
[im 18/101  soft-tissue]
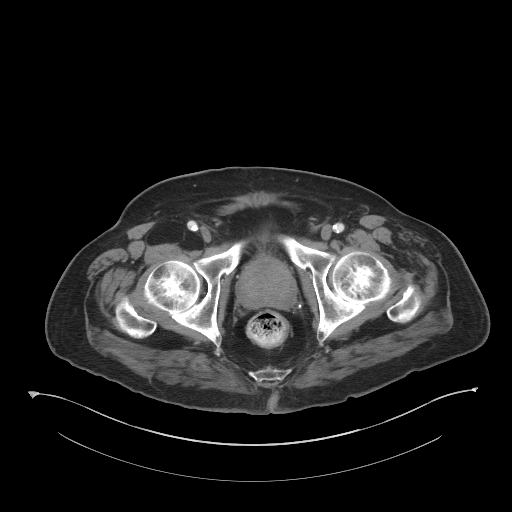
[im 24/101  soft-tissue]
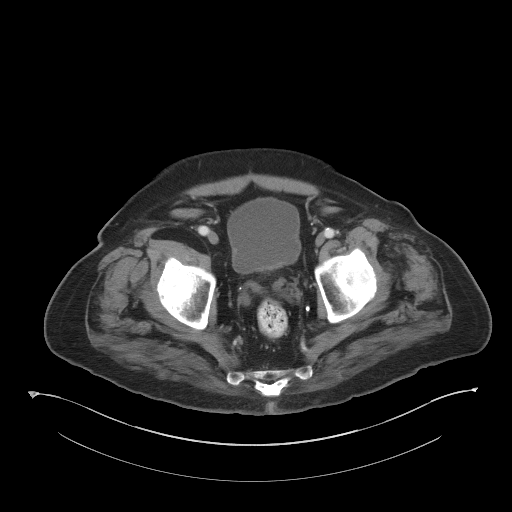
[im 30/101  soft-tissue]
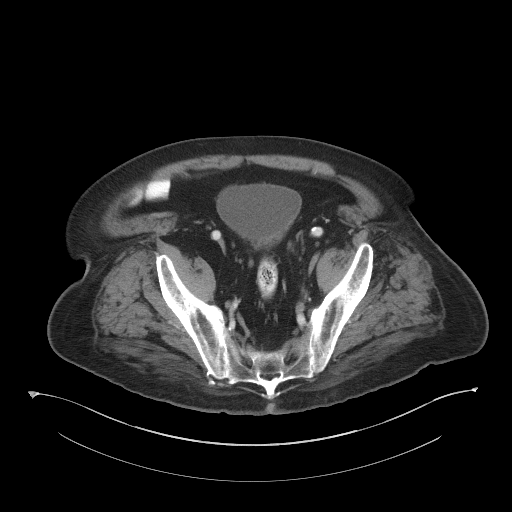
[im 42/101  soft-tissue]
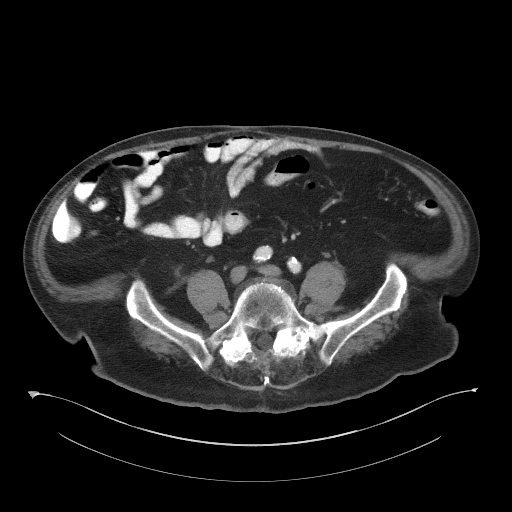
[im 48/101  soft-tissue]
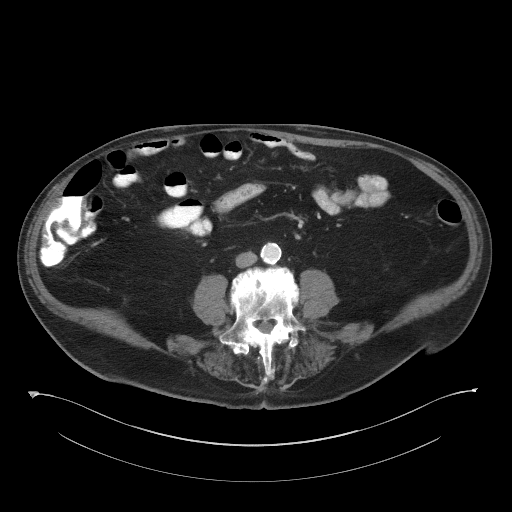
[im 53/101  soft-tissue]
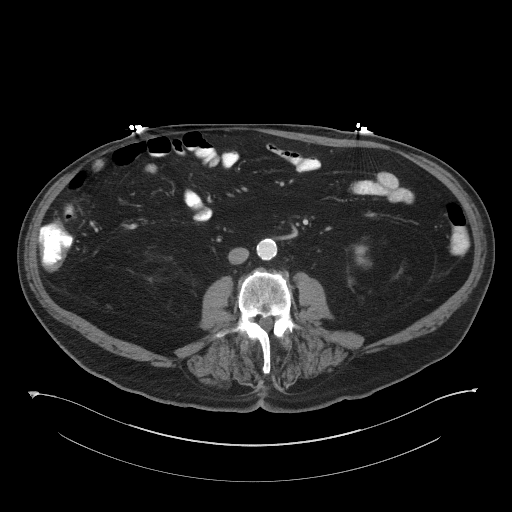
[im 65/101  soft-tissue]
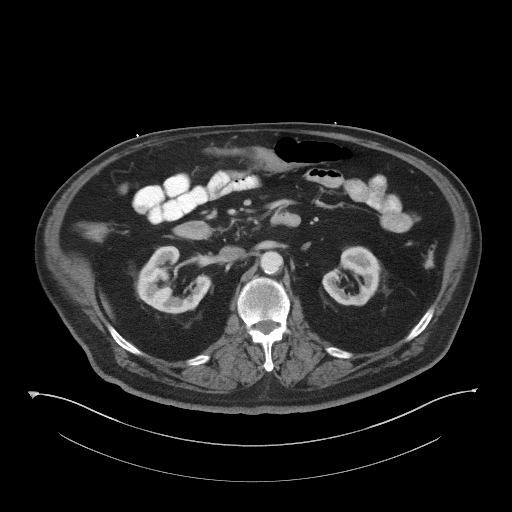
[im 71/101  soft-tissue]
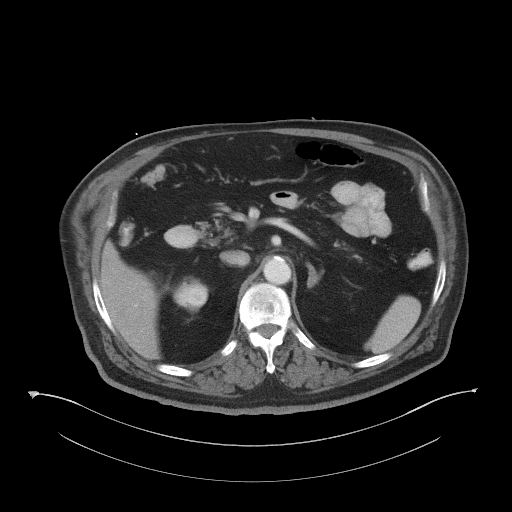
[im 71/101  bone]
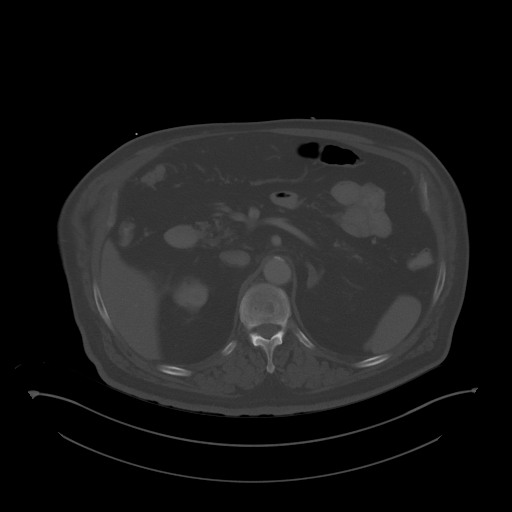
[im 77/101  soft-tissue]
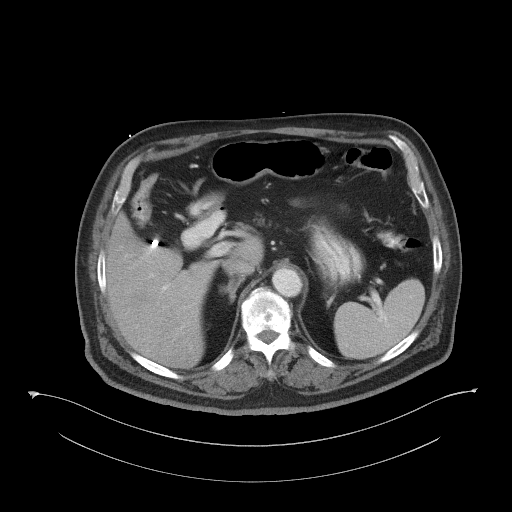
[im 89/101  soft-tissue]
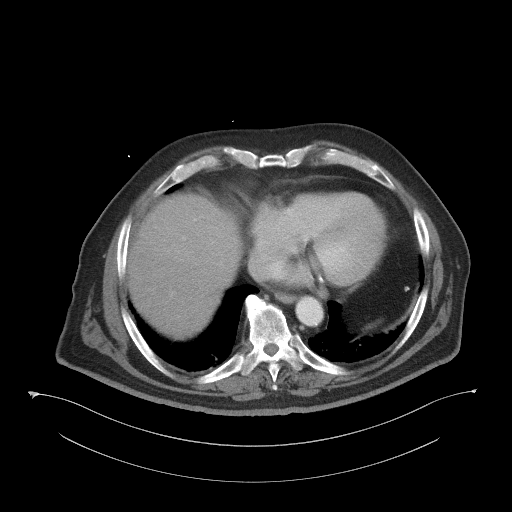
[im 95/101  soft-tissue]
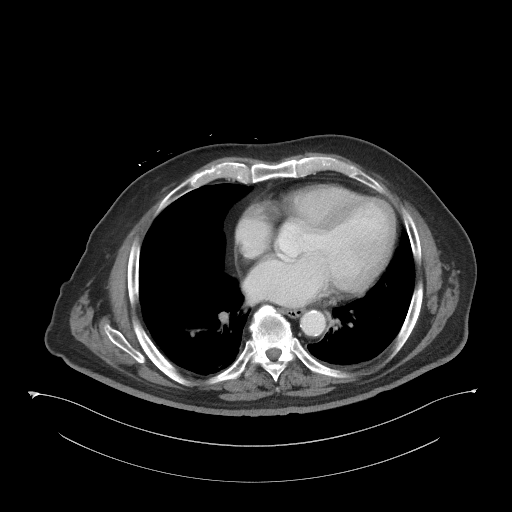

[Series 5: a/p w/ cor · coronal · 0.96mm/px · 3 of 142 slices shown]
[im 48/142  soft-tissue]
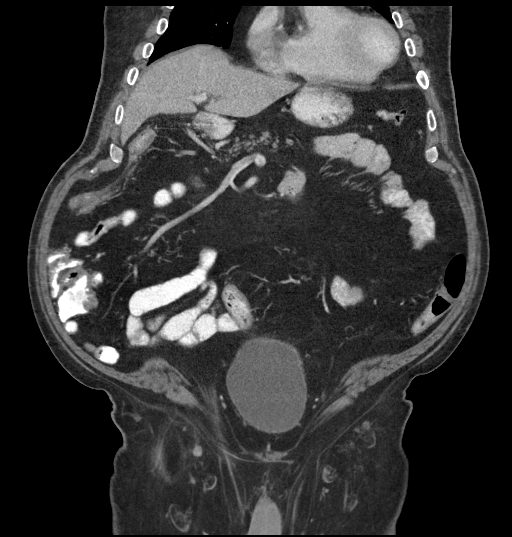
[im 63/142  soft-tissue]
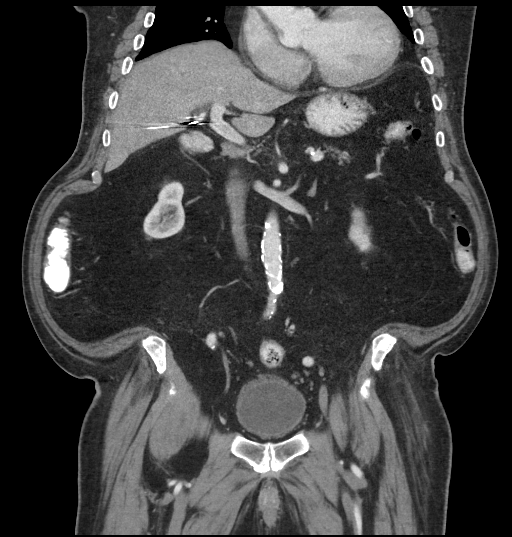
[im 79/142  soft-tissue]
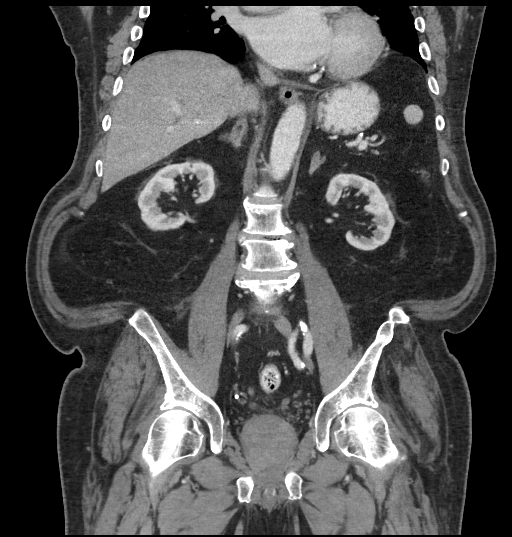

[15 of 46 positions shown; findings below may reference images not displayed]

FINDINGS: The heart is enlarged. Scattered coronary artery calcification
noted. Two benign densely calcified granulomas left lower lobe,
stable. Negative for consolidation or pleural effusion at the lung
bases.

Cholecystectomy. Negative for biliary ductal dilatation. Liver size
normal. Negative for hepatic mass. The portal vein is patent. Normal
spleen, adrenal glands, and pancreas.

Symmetric bilateral cortical thinning of both kidneys, stable.
Negative for hydronephrosis. No visible renal stones. Mild
nonspecific perinephric stranding is unchanged compared to prior CT.
Ureters normal in caliber bilaterally.

The stomach is normally positioned and partially filled with oral
contrast. Gastric wall appears normal in thickness. Distal esophagus
appears normal. The duodenum is normal in caliber and shows no
inflammatory change or wall thickening. Small bowel loops are normal
in caliber. Terminal ileum unremarkable. Normal appendix. Normal
caliber colon. Mild scattered diverticulosis. No colonic wall
thickening. Normal appearance of the rectum.

Prostamegaly. Prostate gland 6 cm transverse diameter. Slight
bladder wall thickening appears stable, and may reflect changes of
chronic bladder outlet obstruction.

Bilateral fat containing inguinal hernias.

Abdominal aorta normal in caliber and contains moderate
atherosclerosis. Moderate atherosclerosis of the bilateral iliac
vasculature. The bilateral renal arteries appear patent. There are 2
right and 2 left renal arteries. The mesenteric vessels are patent.

Negative for lymphadenopathy or ascites.

Stable appearance of the visualized lower thoracic and the lumbar
spine. Multiple prominent anterior osteophytes in the visualized
thoracic spine. Stable home angioma at L2 vertebral body. Prominent
Schmorl stenosis and L2 and L3. Partial fusion of L4 and L5
vertebral bodies and disc desiccation and calcification at L5-S1.
Extensive facet joint degenerative change and hypertrophy in the
lower lumbar spine. Postsurgical changes of the lumbar spine.
IMPRESSION: 1. No acute findings are identified in the abdomen or pelvis.
Specifically, no explanation for epigastric pain identified. No
significant change identified compared to recent CT abdomen/pelvis
of December 2015. Cholecystectomy.

3. Mild colonic diverticulosis without acute inflammatory change.

4. Prostamegaly and possible probable chronic bladder wall
thickening likely related to chronic bladder outlet obstruction.

5. Coronary artery and aortoiliac atherosclerosis.

6. Chronic degenerative changes and postsurgical changes of the
lumbar spine.

## 2017-12-12 ENCOUNTER — Emergency Department: Payer: Medicare Other

## 2017-12-12 ENCOUNTER — Emergency Department
Admission: EM | Admit: 2017-12-12 | Discharge: 2017-12-12 | Disposition: A | Discharge status: Home / Self Care | Payer: Medicare Other | Attending: Emergency Medicine | Admitting: Emergency Medicine

## 2017-12-12 ENCOUNTER — Other Ambulatory Visit: Payer: Self-pay

## 2017-12-12 DIAGNOSIS — E119 Type 2 diabetes mellitus without complications: Secondary | ICD-10-CM | POA: Insufficient documentation

## 2017-12-12 DIAGNOSIS — Z7901 Long term (current) use of anticoagulants: Secondary | ICD-10-CM | POA: Insufficient documentation

## 2017-12-12 DIAGNOSIS — Z79899 Other long term (current) drug therapy: Secondary | ICD-10-CM | POA: Diagnosis not present

## 2017-12-12 DIAGNOSIS — Z87891 Personal history of nicotine dependence: Secondary | ICD-10-CM | POA: Insufficient documentation

## 2017-12-12 DIAGNOSIS — I110 Hypertensive heart disease with heart failure: Secondary | ICD-10-CM | POA: Insufficient documentation

## 2017-12-12 DIAGNOSIS — Z8673 Personal history of transient ischemic attack (TIA), and cerebral infarction without residual deficits: Secondary | ICD-10-CM | POA: Insufficient documentation

## 2017-12-12 DIAGNOSIS — I509 Heart failure, unspecified: Secondary | ICD-10-CM | POA: Diagnosis not present

## 2017-12-12 DIAGNOSIS — I2510 Atherosclerotic heart disease of native coronary artery without angina pectoris: Secondary | ICD-10-CM | POA: Diagnosis not present

## 2017-12-12 DIAGNOSIS — J449 Chronic obstructive pulmonary disease, unspecified: Secondary | ICD-10-CM | POA: Insufficient documentation

## 2017-12-12 DIAGNOSIS — Z794 Long term (current) use of insulin: Secondary | ICD-10-CM | POA: Insufficient documentation

## 2017-12-12 DIAGNOSIS — Z9049 Acquired absence of other specified parts of digestive tract: Secondary | ICD-10-CM | POA: Insufficient documentation

## 2017-12-12 DIAGNOSIS — R0602 Shortness of breath: Secondary | ICD-10-CM | POA: Diagnosis present

## 2017-12-12 LAB — CBC WITH DIFFERENTIAL/PLATELET
BASOS PCT: 1 %
Basophils Absolute: 0.1 10*3/uL (ref 0–0.1)
EOS ABS: 0.1 10*3/uL (ref 0–0.7)
Eosinophils Relative: 2 %
HCT: 40 % (ref 40.0–52.0)
HEMOGLOBIN: 13.5 g/dL (ref 13.0–18.0)
LYMPHS ABS: 1.3 10*3/uL (ref 1.0–3.6)
Lymphocytes Relative: 19 %
MCH: 30.4 pg (ref 26.0–34.0)
MCHC: 33.8 g/dL (ref 32.0–36.0)
MCV: 89.9 fL (ref 80.0–100.0)
Monocytes Absolute: 0.4 10*3/uL (ref 0.2–1.0)
Monocytes Relative: 6 %
NEUTROS ABS: 4.8 10*3/uL (ref 1.4–6.5)
NEUTROS PCT: 72 %
Platelets: 154 10*3/uL (ref 150–440)
RBC: 4.45 MIL/uL (ref 4.40–5.90)
RDW: 13 % (ref 11.5–14.5)
WBC: 6.6 10*3/uL (ref 3.8–10.6)

## 2017-12-12 LAB — COMPREHENSIVE METABOLIC PANEL
ALBUMIN: 3.7 g/dL (ref 3.5–5.0)
ALT: 11 U/L — ABNORMAL LOW (ref 17–63)
ANION GAP: 6 (ref 5–15)
AST: 15 U/L (ref 15–41)
Alkaline Phosphatase: 91 U/L (ref 38–126)
BILIRUBIN TOTAL: 1 mg/dL (ref 0.3–1.2)
BUN: 10 mg/dL (ref 6–20)
CHLORIDE: 102 mmol/L (ref 101–111)
CO2: 30 mmol/L (ref 22–32)
Calcium: 9.3 mg/dL (ref 8.9–10.3)
Creatinine, Ser: 0.8 mg/dL (ref 0.61–1.24)
GFR calc Af Amer: >60 mL/min (ref >60)
GFR calc non Af Amer: >60 mL/min (ref >60)
GLUCOSE: 181 mg/dL — AB (ref 65–99)
POTASSIUM: 3.8 mmol/L (ref 3.5–5.1)
SODIUM: 138 mmol/L (ref 135–145)
TOTAL PROTEIN: 7.5 g/dL (ref 6.5–8.1)

## 2017-12-12 LAB — BRAIN NATRIURETIC PEPTIDE: B Natriuretic Peptide: 478 pg/mL — ABNORMAL HIGH (ref 0.0–100.0)

## 2017-12-12 LAB — TROPONIN I: Troponin I: 0.03 ng/mL (ref <0.03)

## 2017-12-12 MED ORDER — IPRATROPIUM-ALBUTEROL 0.5-2.5 (3) MG/3ML IN SOLN
3.0000 mL | Freq: Once | RESPIRATORY_TRACT | Status: AC
  Administered 2017-12-12: 3 mL via RESPIRATORY_TRACT
  Filled 2017-12-12: qty 3

## 2017-12-12 MED ORDER — METHYLPREDNISOLONE SODIUM SUCC 125 MG IJ SOLR
125.0000 mg | Freq: Once | INTRAMUSCULAR | Status: AC
  Administered 2017-12-12: 125 mg via INTRAVENOUS
  Filled 2017-12-12: qty 2

## 2017-12-12 MED ORDER — FUROSEMIDE 10 MG/ML IJ SOLN
40.0000 mg | Freq: Once | INTRAMUSCULAR | Status: AC
  Administered 2017-12-12: 40 mg via INTRAVENOUS
  Filled 2017-12-12: qty 4

## 2017-12-12 NOTE — ED Notes (Signed)
Date and time results received: 12/12/17 4:55 PM  Test: Troponin Critical Value: Trop: 0.03  Name of Provider Notified: Dr. Lamont Snowball

## 2017-12-12 NOTE — Discharge Instructions (Signed)
Please double your home lasix dose tonight and follow up in the heart failure clinic tomorrow for a recheck.  Return to the ED sooner for any concerns.  It was a pleasure to take care of you today, and thank you for coming to our emergency department.  If you have any questions or concerns before leaving please ask the nurse to grab me and I'm more than happy to go through your aftercare instructions again.  If you were prescribed any opioid pain medication today such as Norco, Vicodin, Percocet, morphine, hydrocodone, or oxycodone please make sure you do not drive when you are taking this medication as it can alter your ability to drive safely.  If you have any concerns once you are home that you are not improving or are in fact getting worse before you can make it to your follow-up appointment, please do not hesitate to call [phone] and come back for further evaluation.  Merrily Brittle, MD  Results for orders placed or performed during the hospital encounter of 12/12/17  Comprehensive metabolic panel  Result Value Ref Range   Sodium 138 135 - 145 mmol/L   Potassium 3.8 3.5 - 5.1 mmol/L   Chloride 102 101 - 111 mmol/L   CO2 30 22 - 32 mmol/L   Glucose, Bld 181 (H) 65 - 99 mg/dL   BUN 10 6 - 20 mg/dL   Creatinine, Ser 5.37 0.61 - 1.24 mg/dL   Calcium 9.3 8.9 - 94.3 mg/dL   Total Protein 7.5 6.5 - 8.1 g/dL   Albumin 3.7 3.5 - 5.0 g/dL   AST 15 15 - 41 U/L   ALT 11 (L) 17 - 63 U/L   Alkaline Phosphatase 91 38 - 126 U/L   Total Bilirubin 1.0 0.3 - 1.2 mg/dL   GFR calc non Af Amer >60 >60 mL/min   GFR calc Af Amer >60 >60 mL/min   Anion gap 6 5 - 15  Brain natriuretic peptide  Result Value Ref Range   B Natriuretic Peptide 478.0 (H) 0.0 - 100.0 pg/mL  Troponin I  Result Value Ref Range   Troponin I 0.03 (HH) <0.03 ng/mL  CBC with Differential  Result Value Ref Range   WBC 6.6 3.8 - 10.6 K/uL   RBC 4.45 4.40 - 5.90 MIL/uL   Hemoglobin 13.5 13.0 - 18.0 g/dL   HCT 27.6 14.7 - 09.2 %   MCV 89.9 80.0 - 100.0 fL   MCH 30.4 26.0 - 34.0 pg   MCHC 33.8 32.0 - 36.0 g/dL   RDW 95.7 47.3 - 40.3 %   Platelets 154 150 - 440 K/uL   Neutrophils Relative % 72 %   Neutro Abs 4.8 1.4 - 6.5 K/uL   Lymphocytes Relative 19 %   Lymphs Abs 1.3 1.0 - 3.6 K/uL   Monocytes Relative 6 %   Monocytes Absolute 0.4 0.2 - 1.0 K/uL   Eosinophils Relative 2 %   Eosinophils Absolute 0.1 0 - 0.7 K/uL   Basophils Relative 1 %   Basophils Absolute 0.1 0 - 0.1 K/uL  Blood gas, venous  Result Value Ref Range   pH, Ven 7.37 7.250 - 7.430   pCO2, Ven 58 44.0 - 60.0 mmHg   pO2, Ven PENDING 32.0 - 45.0 mmHg   Bicarbonate 33.5 (H) 20.0 - 28.0 mmol/L   Acid-Base Excess 6.5 (H) 0.0 - 2.0 mmol/L   O2 Saturation PENDING %   Patient temperature 37.0    Collection site VENOUS  Sample type VENOUS    Dg Chest Port 1 View  Result Date: 12/12/2017 CLINICAL DATA:  Difficulty breathing. EXAM: PORTABLE CHEST 1 VIEW COMPARISON:  03/22/2016 FINDINGS: Enlarged cardiac silhouette. Calcific atherosclerotic disease of the aorta. Mediastinal contours appear intact. There is no evidence of focal airspace consolidation, scratch or pneumothorax. Mixed pattern pulmonary edema. Probable bilateral pleural effusions. Osseous structures are without acute abnormality. Soft tissues are grossly normal. IMPRESSION: Interval development of mixed pattern pulmonary edema with probable bilateral pleural effusions. Enlarged cardiac silhouette. Electronically Signed   By: Ted Mcalpine M.D.   On: 12/12/2017 16:05

## 2017-12-12 NOTE — ED Notes (Signed)
Pt up using urinal for "fourth time" per daughter. 380 mL of urine voided.

## 2017-12-12 NOTE — ED Triage Notes (Signed)
Pt arrived via [facility] from home with c/o shortness of breath. EMS states pt daughter said he was having trouble breathing and was short of breath at home. EMS states that pt had work of breathing when he got up to walk. EMS states he is normally on 3L [place]. Pt has hx of COPD, CHF. Pt AxOx3.

## 2017-12-12 NOTE — ED Provider Notes (Signed)
[facility] Emergency Department Provider Note  ____________________________________________   First MD Initiated Contact with Patient 12/12/17 1512     (approximate)  I have reviewed the triage vital signs and the nursing notes.   HISTORY  Chief Complaint Shortness of Breath   HPI Bobby Krueger is a 84 y.o. male who comes the emergency department via EMS with shortness of breath.  According to EMS the patient had had some shortness of breath for the past day or 2.  The patient himself said he did not want to come to the hospital but he did at his daughter's insistence.  He has a history of CHF as well as COPD.  He denies pain.  He denies fevers or chills.  His symptoms are somewhat worse on walking and somewhat improved with rest.  He does not think he has gained any weight.  He reports compliance with his medications.  Past Medical History:  Diagnosis Date  . A-fib (HCC)   . 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 03/2014: EF 35%, normal nuc in 05/2014 b. echo 11/2015: EF 40-45% w/ diffuse HK and Grade 1 DD  . COPD (chronic obstructive pulmonary disease) (HCC)   . Diabetes mellitus without complication (HCC)   . High cholesterol   . Hypertension     Patient Active Problem List   Diagnosis Date Noted  . Weakness 06/10/2016  . Cerebral thrombosis with cerebral infarction 03/23/2016  . Acute ischemic stroke (HCC) 03/23/2016  . Acute CVA (cerebrovascular accident) (HCC) 03/23/2016  . Pain in the chest 03/22/2016  . Dizziness 03/22/2016  . Abdominal pain 03/22/2016  . Essential hypertension 03/22/2016  . Chronic combined systolic (congestive) and diastolic (congestive) heart failure (HCC)   . 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 Bradycardia 02/10/2016  . Atrial fibrillation (HCC) 11/19/2015  . CAD (coronary artery disease) 11/16/2015  . COPD (chronic  obstructive pulmonary disease) (HCC) 11/16/2015  . GERD (gastroesophageal reflux disease) 11/16/2015    Past Surgical History:  Procedure Laterality Date  . BACK SURGERY    . CARPAL TUNNEL RELEASE    . CHOLECYSTECTOMY      Prior to Admission medications   Medication Sig Start Date End Date Taking? Authorizing Provider  albuterol (PROAIR HFA) 108 (90 Base) MCG/ACT inhaler Inhale 2 puffs into the lungs every 4 (four) hours as needed for wheezing or shortness of breath.     [provider]  apixaban (ELIQUIS) 5 MG TABS tablet Take 1 tablet (5 mg total) by mouth 2 (two) times daily. 11/21/15   [name], MD  atorvastatin (LIPITOR) 20 MG tablet Take 1 tablet (20 mg total) by mouth daily at 6 PM. 03/26/16   [name], MD  brimonidine (ALPHAGAN) 0.2 % ophthalmic solution Place 1 drop into both eyes daily.     [provider]  carvedilol (COREG) 6.25 MG tablet Take 6.25 mg by mouth 2 (two) times daily with a meal.     [provider]  Fluticasone-Salmeterol (ADVAIR DISKUS) 250-50 MCG/DOSE AEPB Inhale 1 puff into the lungs daily as needed (for wheezing or shortness).     [provider]  furosemide (LASIX) 40 MG tablet Take 1 tablet (40 mg total) by mouth daily. 02/23/16   Ngetich, Dinah C, NP  gabapentin (NEURONTIN) 300 MG capsule Take 300 mg by mouth at bedtime.    [provider]  ibuprofen (ADVIL,MOTRIN) 400  MG tablet Take 400 mg by mouth every 6 (six) hours as needed for moderate pain.    [provider]  insulin glargine (LANTUS) 100 UNIT/ML injection Inject 0.08 mLs (8 Units total) into the skin at bedtime. Patient taking differently: Inject 10 Units into the skin at bedtime.  02/14/16   Hower, Cletis [place], MD  insulin lispro (HUMALOG) 100 UNIT/ML injection Inject 0-9 Units into the skin 3 (three) times daily before meals. CBG's 70-120 = 0 units; 121-150 =1 units; 151-200 = 2 units; 201-250 =3 units; 251-300 =5 units; 301-350 =7 units;  351-400=9 units and > 400 Notify provider.    [provider]  ipratropium-albuterol (DUONEB) 0.5-2.5 (3) MG/3ML SOLN Take 3 mLs by nebulization every 6 (six) hours as needed (for SOB).    [provider]  latanoprost (XALATAN) 0.005 % ophthalmic solution Place 1 drop into both eyes at bedtime.    [provider]  losartan (COZAAR) 100 MG tablet Take 100 mg by mouth daily.    [provider]  nateglinide (STARLIX) 120 MG tablet Take 120 mg by mouth daily. 10/02/15   [provider]  Omega-3 1000 MG CAPS Take 1 g by mouth daily.    [provider]  omeprazole (PRILOSEC) 20 MG capsule Take 20 mg by mouth daily.    [provider]  polyvinyl alcohol (LIQUIFILM TEARS) 1.4 % ophthalmic solution Place 1 drop into both eyes as needed for dry eyes. Reported on 12/21/2015    [provider]  potassium chloride SA (K-DUR,KLOR-CON) 20 MEQ tablet Take 20 mEq by mouth daily.    [provider]  SPIRIVA HANDIHALER 18 MCG inhalation capsule Place 1 application into inhaler and inhale daily. 10/13/15   [provider]  terazosin (HYTRIN) 5 MG capsule Take 5 mg by mouth every evening.    [provider]  triamcinolone cream (KENALOG) 0.1 % Apply 1 application topically 2 (two) times daily as needed (for irritation).    [provider]  trimethoprim-polymyxin b (POLYTRIM) ophthalmic solution Place 2 drops into the right eye every 6 (six) hours. Patient not taking: Reported on 06/09/2016 02/02/16   Cuthriell, Delorise Royals, PA-C    Allergies Aldactone [spironolactone]; Aspirin; Ciprofloxacin; Cyclobenzaprine; and Lexapro [escitalopram]  Family History  Problem Relation Age of Onset  . Prostate cancer Unknown     Social History Social History   Tobacco Use  . Smoking status: Former Games developer  . Smokeless tobacco: Never Used  Substance Use Topics  . Alcohol use: No  . Drug use: No    Review of  Systems Constitutional: No fever/chills Eyes: No visual changes. ENT: No sore throat. Cardiovascular: Denies chest pain. Respiratory: Positive for shortness of breath. Gastrointestinal: No abdominal pain.  No nausea, no vomiting.  No diarrhea.  No constipation. Genitourinary: Negative for dysuria. Musculoskeletal: Negative for back pain. Skin: Negative for rash. Neurological: Negative for headaches, focal weakness or numbness.   ____________________________________________   PHYSICAL EXAM:  VITAL SIGNS: ED Triage Vitals  Enc Vitals Group     BP      Pulse      Resp      Temp      Temp src      SpO2      Weight      Height      Head Circumference      Peak Flow      Pain Score      Pain Loc  Pain Edu?      Excl. in GC?     Constitutional: Alert and oriented x4 pleasant cooperative mild shortness of breath Eyes: PERRL EOMI. Head: Atraumatic. Nose: No congestion/rhinnorhea. Mouth/Throat: No trismus Neck: No stridor.   Cardiovascular: Normal rate, regular rhythm. Grossly normal heart sounds.  Good peripheral circulation. Respiratory: Increased respiratory effort.  No retractions.  Crackles at bilateral bases Gastrointestinal: Soft nontender Musculoskeletal: Legs are equal in size Neurologic:  Normal speech and language. No gross focal neurologic deficits are appreciated. Skin:  Skin is warm, dry and intact. No rash noted. Psychiatric: Mood and affect are normal. Speech and behavior are normal.    ____________________________________________   DIFFERENTIAL includes but not limited to  Pulmonary edema, pneumonia, pneumothorax, CHF exacerbation ____________________________________________   LABS (all labs ordered are listed, but only abnormal results are displayed)  Labs Reviewed  COMPREHENSIVE METABOLIC PANEL - Abnormal; Notable for the following components:      Result Value   Glucose, Bld 181 (*)    ALT 11 (*)    All other components within normal  limits  BRAIN NATRIURETIC PEPTIDE - Abnormal; Notable for the following components:   B Natriuretic Peptide 478.0 (*)    All other components within normal limits  TROPONIN I - Abnormal; Notable for the following components:   Troponin I 0.03 (*)    All other components within normal limits  BLOOD GAS, VENOUS - Abnormal; Notable for the following components:   Bicarbonate 33.5 (*)    Acid-Base Excess 6.5 (*)    All other components within normal limits  CBC WITH DIFFERENTIAL/PLATELET    Lab work reviewed by me with slightly elevated troponin and BNP consistent with stretch __________________________________________  EKG  ED ECG REPORT I, Merrily Brittle, the attending physician, personally viewed and interpreted this ECG.  Date: 12/12/2017 EKG Time:  Rate: 77 Rhythm: normal sinus rhythm QRS Axis: normal Intervals: normal ST/T Wave abnormalities: Normal repolarization abnormalities Narrative Interpretation: Left bundle branch block with normal repolarization abnormalities  ____________________________________________  RADIOLOGY  Test x-ray reviewed by me with mild pulmonary edema ____________________________________________   PROCEDURES  Procedure(s) performed: no  Procedures  Critical Care performed: no  Observation: no ____________________________________________   INITIAL IMPRESSION / ASSESSMENT AND PLAN / ED COURSE  Pertinent labs & imaging results that were available during my care of the patient were reviewed by me and considered in my medical decision making (see chart for details).  The patient arrives mildly short of breath with crackles at bilateral bases.  Unclear if his symptoms are secondary to cardiac or pulmonary in etiology.  2 breathing treatments and steroids are pending.  Chest x-ray more consistent with pulmonary edema.  Given an IV dose of Lasix with improvement in his symptoms.  I will refer him to the heart failure clinic as an  outpatient.  He is discharged home in improved condition verbalizes understanding and agreement with plan.      ____________________________________________   FINAL CLINICAL IMPRESSION(S) / ED DIAGNOSES  Final diagnoses:  Acute on chronic congestive heart failure, unspecified heart failure type (HCC)      NEW MEDICATIONS STARTED DURING THIS VISIT:  Discharge Medication List as of 12/12/2017  5:32 PM       Note:  This document was prepared using Dragon voice recognition software and may include unintentional dictation errors.     Merrily Brittle, MD 12/12/17 2146

## 2017-12-16 LAB — BLOOD GAS, VENOUS
ACID-BASE EXCESS: 6.5 mmol/L — AB (ref 0.0–2.0)
Bicarbonate: 33.5 mmol/L — ABNORMAL HIGH (ref 20.0–28.0)
PATIENT TEMPERATURE: 37
PCO2 VEN: 58 mmHg (ref 44.0–60.0)
PH VEN: 7.37 (ref 7.250–7.430)

## 2017-12-20 ENCOUNTER — Ambulatory Visit: Payer: Medicare Other | Admitting: Family

## 2017-12-20 ENCOUNTER — Telehealth: Payer: Self-pay | Admitting: Family

## 2017-12-20 NOTE — Telephone Encounter (Signed)
Patient did not show for his Heart Failure Clinic appointment on 12/20/17. Will attempt to reschedule.  

## 2018-03-25 ENCOUNTER — Encounter: Payer: Self-pay | Admitting: Emergency Medicine

## 2018-03-25 ENCOUNTER — Emergency Department: Payer: Medicare Other

## 2018-03-25 ENCOUNTER — Other Ambulatory Visit: Payer: Self-pay

## 2018-03-25 ENCOUNTER — Emergency Department
Admission: EM | Admit: 2018-03-25 | Discharge: 2018-03-25 | Disposition: A | Discharge status: Home / Self Care | Payer: Medicare Other | Attending: Emergency Medicine | Admitting: Emergency Medicine

## 2018-03-25 DIAGNOSIS — Z87891 Personal history of nicotine dependence: Secondary | ICD-10-CM | POA: Insufficient documentation

## 2018-03-25 DIAGNOSIS — I5042 Chronic combined systolic (congestive) and diastolic (congestive) heart failure: Secondary | ICD-10-CM | POA: Insufficient documentation

## 2018-03-25 DIAGNOSIS — J449 Chronic obstructive pulmonary disease, unspecified: Secondary | ICD-10-CM | POA: Diagnosis not present

## 2018-03-25 DIAGNOSIS — R531 Weakness: Secondary | ICD-10-CM | POA: Diagnosis not present

## 2018-03-25 DIAGNOSIS — I110 Hypertensive heart disease with heart failure: Secondary | ICD-10-CM | POA: Diagnosis not present

## 2018-03-25 DIAGNOSIS — R630 Anorexia: Secondary | ICD-10-CM | POA: Diagnosis present

## 2018-03-25 DIAGNOSIS — I2510 Atherosclerotic heart disease of native coronary artery without angina pectoris: Secondary | ICD-10-CM | POA: Diagnosis not present

## 2018-03-25 DIAGNOSIS — W19XXXA Unspecified fall, initial encounter: Secondary | ICD-10-CM

## 2018-03-25 LAB — COMPREHENSIVE METABOLIC PANEL
ALK PHOS: 94 U/L (ref 38–126)
ALT: 12 U/L (ref 0–44)
AST: 16 U/L (ref 15–41)
Albumin: 3.3 g/dL — ABNORMAL LOW (ref 3.5–5.0)
Anion gap: 6 (ref 5–15)
BUN: 13 mg/dL (ref 8–23)
CALCIUM: 8.8 mg/dL — AB (ref 8.9–10.3)
CO2: 36 mmol/L — AB (ref 22–32)
CREATININE: 1.09 mg/dL (ref 0.61–1.24)
Chloride: 102 mmol/L (ref 98–111)
GFR calc Af Amer: >60 mL/min (ref >60)
GFR, EST NON AFRICAN AMERICAN: 58 mL/min — AB (ref >60)
Glucose, Bld: 113 mg/dL — ABNORMAL HIGH (ref 70–99)
Potassium: 3.7 mmol/L (ref 3.5–5.1)
SODIUM: 144 mmol/L (ref 135–145)
Total Bilirubin: 0.8 mg/dL (ref 0.3–1.2)
Total Protein: 6.5 g/dL (ref 6.5–8.1)

## 2018-03-25 LAB — CBC WITH DIFFERENTIAL/PLATELET
Basophils Absolute: 0.1 10*3/uL (ref 0–0.1)
Basophils Relative: 1 %
Eosinophils Absolute: 0.2 10*3/uL (ref 0–0.7)
Eosinophils Relative: 3 %
HCT: 35.2 % — ABNORMAL LOW (ref 40.0–52.0)
HEMOGLOBIN: 11.7 g/dL — AB (ref 13.0–18.0)
LYMPHS ABS: 1.1 10*3/uL (ref 1.0–3.6)
LYMPHS PCT: 17 %
MCH: 29.6 pg (ref 26.0–34.0)
MCHC: 33.3 g/dL (ref 32.0–36.0)
MCV: 88.9 fL (ref 80.0–100.0)
Monocytes Absolute: 0.4 10*3/uL (ref 0.2–1.0)
Monocytes Relative: 6 %
NEUTROS PCT: 73 %
Neutro Abs: 4.7 10*3/uL (ref 1.4–6.5)
Platelets: 173 10*3/uL (ref 150–440)
RBC: 3.96 MIL/uL — AB (ref 4.40–5.90)
RDW: 14 % (ref 11.5–14.5)
WBC: 6.4 10*3/uL (ref 3.8–10.6)

## 2018-03-25 LAB — URINALYSIS, COMPLETE (UACMP) WITH MICROSCOPIC
BILIRUBIN URINE: NEGATIVE
Bacteria, UA: NONE SEEN
Glucose, UA: NEGATIVE mg/dL
KETONES UR: NEGATIVE mg/dL
Leukocytes, UA: NEGATIVE
Nitrite: NEGATIVE
PH: 5 (ref 5.0–8.0)
PROTEIN: NEGATIVE mg/dL
SQUAMOUS EPITHELIAL / LPF: NONE SEEN (ref 0–5)
Specific Gravity, Urine: 1.01 (ref 1.005–1.030)

## 2018-03-25 LAB — TROPONIN I: Troponin I: 0.03 ng/mL (ref <0.03)

## 2018-03-25 MED ORDER — AZITHROMYCIN 500 MG PO TABS
500.0000 mg | ORAL_TABLET | Freq: Once | ORAL | Status: AC
  Administered 2018-03-25: 500 mg via ORAL
  Filled 2018-03-25: qty 1

## 2018-03-25 MED ORDER — AZITHROMYCIN 250 MG PO TABS: ORAL_TABLET | ORAL | 0 refills | Status: AC

## 2018-03-25 NOTE — ED Triage Notes (Signed)
Patient brought in by home by ems. Patient lives with daughter. Per ems patient fell earlier today but that is normal for him. Patient did not want to eat supper tonight and the daughter was concerned and wanted him checked out. Patient with history of dementia.

## 2018-03-25 NOTE — ED Provider Notes (Signed)
[facility] Emergency Department Provider Note       Time seen: ----------------------------------------- 8:27 PM on 03/25/2018 -----------------------------------------   I have reviewed the triage vital signs and the nursing notes.  HISTORY   Chief Complaint No chief complaint on file.    HPI Bobby Krueger is a 84 y.o. male with a history of atrial fibrillation, heart failure, COPD, diabetes, hyperlipidemia, hypertension who presents to the ED for poor appetite.  Patient lives with the daughter and did not want to eat dinner tonight so his family were concerned and they wanted him checked out.  He also had a fall earlier this evening but did not have significant injury from same.  He has a history of dementia and states he did not want to eat what they were eating.  He denies any other complaints at this time.  Past Medical History:  Diagnosis Date  . A-fib (HCC)   . 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 03/2014: EF 35%, normal nuc in 05/2014 b. echo 11/2015: EF 40-45% w/ diffuse HK and Grade 1 DD  . COPD (chronic obstructive pulmonary disease) (HCC)   . Diabetes mellitus without complication (HCC)   . High cholesterol   . Hypertension     Patient Active Problem List   Diagnosis Date Noted  . Weakness 06/10/2016  . Cerebral thrombosis with cerebral infarction 03/23/2016  . Acute ischemic stroke (HCC) 03/23/2016  . Acute CVA (cerebrovascular accident) (HCC) 03/23/2016  . Pain in the chest 03/22/2016  . Dizziness 03/22/2016  . Abdominal pain 03/22/2016  . Essential hypertension 03/22/2016  . Chronic combined systolic (congestive) and diastolic (congestive) heart failure (HCC)   . 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 Bradycardia 02/10/2016  . Atrial fibrillation (HCC) 11/19/2015  . CAD (coronary artery disease) 11/16/2015  . COPD (chronic  obstructive pulmonary disease) (HCC) 11/16/2015  . GERD (gastroesophageal reflux disease) 11/16/2015    Past Surgical History:  Procedure Laterality Date  . BACK SURGERY    . CARPAL TUNNEL RELEASE    . CHOLECYSTECTOMY      Allergies Aldactone [spironolactone]; Aspirin; Ciprofloxacin; Cyclobenzaprine; and Lexapro [escitalopram]  Social History Social History   Tobacco Use  . Smoking status: Former Games developer  . Smokeless tobacco: Never Used  Substance Use Topics  . Alcohol use: No  . Drug use: No   Review of Systems Constitutional: Negative for fever. Cardiovascular: Negative for chest pain. Respiratory: Negative for shortness of breath. Gastrointestinal: Negative for abdominal pain, vomiting and diarrhea. Musculoskeletal: Negative for back pain. Skin: Negative for rash. Neurological: Negative for headaches  All systems negative/normal/unremarkable except as stated in the HPI  ____________________________________________   PHYSICAL EXAM:  VITAL SIGNS: ED Triage Vitals  Enc Vitals Group     BP      Pulse      Resp      Temp      Temp src      SpO2      Weight      Height      Head Circumference      Peak Flow      Pain Score      Pain Loc      Pain Edu?      Excl. in GC?    Constitutional: Alert and oriented. Well appearing and in no distress. Eyes: Conjunctivae are normal. Normal extraocular movements. ENT   Head: Normocephalic and atraumatic.  Nose: No congestion/rhinnorhea.   Mouth/Throat: Mucous membranes are moist.   Neck: No stridor. Cardiovascular: Normal rate, regular rhythm. No murmurs, rubs, or gallops. Respiratory: Normal respiratory effort without tachypnea nor retractions. Breath sounds are clear and equal bilaterally. No wheezes/rales/rhonchi. Gastrointestinal: Small umbilical and ventral hernia, mildly tender. Musculoskeletal: Nontender with normal range of motion in extremities. No lower extremity tenderness nor  edema. Neurologic:  Normal speech and language. No gross focal neurologic deficits are appreciated.  Skin:  Skin is warm, dry and intact. No rash noted. Psychiatric: Mood and affect are normal. Speech and behavior are normal.  ____________________________________________  EKG: Interpreted by me.  Atrial fibrillation with a rate of 91 bpm, wide QRS, long QT, normal axis  ____________________________________________  ED COURSE:  As part of my medical decision making, I reviewed the following data within the electronic MEDICAL RECORD NUMBER History obtained from family if available, nursing notes, old chart and ekg, as well as notes from prior ED visits. Patient presented for weakness and poor appetite, we will assess with labs and imaging as indicated at this time.   Procedures ____________________________________________   LABS (pertinent positives/negatives)  Labs Reviewed  CBC WITH DIFFERENTIAL/PLATELET - Abnormal; Notable for the following components:      Result Value   RBC 3.96 (*)    Hemoglobin 11.7 (*)    HCT 35.2 (*)    All other components within normal limits  COMPREHENSIVE METABOLIC PANEL - Abnormal; Notable for the following components:   CO2 36 (*)    Glucose, Bld 113 (*)    Calcium 8.8 (*)    Albumin 3.3 (*)    GFR calc non Af Amer 58 (*)    All other components within normal limits  TROPONIN I - Abnormal; Notable for the following components:   Troponin I 0.03 (*)    All other components within normal limits  URINALYSIS, COMPLETE (UACMP) WITH MICROSCOPIC - Abnormal; Notable for the following components:   Color, Urine YELLOW (*)    APPearance CLEAR (*)    Hgb urine dipstick SMALL (*)    All other components within normal limits    RADIOLOGY Images were viewed by me  Acute abdominal series  IMPRESSION: 1. Low lung volumes with crowding of interstitial lung markings. Confluent opacities at the left lung base raise suspicion for pneumonia and/or  atelectasis. 2. Nonspecific, nonobstructive bowel gas pattern with average amount of fecal retention within the colon. 3. Lumbar spondylosis. ____________________________________________  DIFFERENTIAL DIAGNOSIS   Dehydration, electrolyte abnormality, occult infection  FINAL ASSESSMENT AND PLAN  Weakness, poor appetite   Plan: The patient had presented for weakness and poor appetite. Patient's labs not reveal any acute process. Patient's imaging may indicate a developing pneumonia but likely is just atelectasis from poor respiratory effort.  I did start him on a Z-Pak and he is cleared for outpatient follow-up.   Ulice Dash, MD   Note: This note was generated in part or whole with voice recognition software. Voice recognition is usually quite accurate but there are transcription errors that can and very often do occur. I apologize for any typographical errors that were not detected and corrected.     Emily Filbert, MD 03/25/18 2233

## 2018-04-04 ENCOUNTER — Other Ambulatory Visit: Payer: Self-pay

## 2018-04-04 ENCOUNTER — Emergency Department
Admission: EM | Admit: 2018-04-04 | Discharge: 2018-04-04 | Disposition: A | Discharge status: Home / Self Care | Payer: Medicare Other | Attending: Emergency Medicine | Admitting: Emergency Medicine

## 2018-04-04 DIAGNOSIS — E7800 Pure hypercholesterolemia, unspecified: Secondary | ICD-10-CM | POA: Insufficient documentation

## 2018-04-04 DIAGNOSIS — Z794 Long term (current) use of insulin: Secondary | ICD-10-CM | POA: Insufficient documentation

## 2018-04-04 DIAGNOSIS — I110 Hypertensive heart disease with heart failure: Secondary | ICD-10-CM | POA: Diagnosis not present

## 2018-04-04 DIAGNOSIS — Z8673 Personal history of transient ischemic attack (TIA), and cerebral infarction without residual deficits: Secondary | ICD-10-CM | POA: Diagnosis not present

## 2018-04-04 DIAGNOSIS — I5042 Chronic combined systolic (congestive) and diastolic (congestive) heart failure: Secondary | ICD-10-CM | POA: Diagnosis not present

## 2018-04-04 DIAGNOSIS — R339 Retention of urine, unspecified: Secondary | ICD-10-CM | POA: Diagnosis not present

## 2018-04-04 DIAGNOSIS — Z7901 Long term (current) use of anticoagulants: Secondary | ICD-10-CM | POA: Diagnosis not present

## 2018-04-04 DIAGNOSIS — J449 Chronic obstructive pulmonary disease, unspecified: Secondary | ICD-10-CM | POA: Diagnosis not present

## 2018-04-04 DIAGNOSIS — E119 Type 2 diabetes mellitus without complications: Secondary | ICD-10-CM | POA: Diagnosis not present

## 2018-04-04 DIAGNOSIS — Z79899 Other long term (current) drug therapy: Secondary | ICD-10-CM | POA: Diagnosis not present

## 2018-04-04 DIAGNOSIS — I4891 Unspecified atrial fibrillation: Secondary | ICD-10-CM | POA: Insufficient documentation

## 2018-04-04 DIAGNOSIS — Z87891 Personal history of nicotine dependence: Secondary | ICD-10-CM | POA: Diagnosis not present

## 2018-04-04 LAB — BASIC METABOLIC PANEL
Anion gap: 6 (ref 5–15)
BUN: 18 mg/dL (ref 8–23)
CO2: 40 mmol/L — ABNORMAL HIGH (ref 22–32)
Calcium: 8.6 mg/dL — ABNORMAL LOW (ref 8.9–10.3)
Chloride: 93 mmol/L — ABNORMAL LOW (ref 98–111)
Creatinine, Ser: 1.11 mg/dL (ref 0.61–1.24)
GFR calc Af Amer: >60 mL/min (ref >60)
GFR, EST NON AFRICAN AMERICAN: 57 mL/min — AB (ref >60)
GLUCOSE: 196 mg/dL — AB (ref 70–99)
POTASSIUM: 3.3 mmol/L — AB (ref 3.5–5.1)
Sodium: 139 mmol/L (ref 135–145)

## 2018-04-04 LAB — CBC WITH DIFFERENTIAL/PLATELET
Basophils Absolute: 0 10*3/uL (ref 0–0.1)
Basophils Relative: 1 %
EOS PCT: 6 %
Eosinophils Absolute: 0.3 10*3/uL (ref 0–0.7)
HCT: 32 % — ABNORMAL LOW (ref 40.0–52.0)
Hemoglobin: 10.6 g/dL — ABNORMAL LOW (ref 13.0–18.0)
LYMPHS ABS: 0.8 10*3/uL — AB (ref 1.0–3.6)
LYMPHS PCT: 17 %
MCH: 30 pg (ref 26.0–34.0)
MCHC: 33.2 g/dL (ref 32.0–36.0)
MCV: 90.5 fL (ref 80.0–100.0)
MONO ABS: 0.3 10*3/uL (ref 0.2–1.0)
Monocytes Relative: 8 %
Neutro Abs: 3.2 10*3/uL (ref 1.4–6.5)
Neutrophils Relative %: 68 %
Platelets: 166 10*3/uL (ref 150–440)
RBC: 3.54 MIL/uL — ABNORMAL LOW (ref 4.40–5.90)
RDW: 14.9 % — ABNORMAL HIGH (ref 11.5–14.5)
WBC: 4.6 10*3/uL (ref 3.8–10.6)

## 2018-04-04 LAB — URINALYSIS, COMPLETE (UACMP) WITH MICROSCOPIC
BACTERIA UA: NONE SEEN
BILIRUBIN URINE: NEGATIVE
GLUCOSE, UA: NEGATIVE mg/dL
HGB URINE DIPSTICK: NEGATIVE
KETONES UR: NEGATIVE mg/dL
Leukocytes, UA: NEGATIVE
Nitrite: NEGATIVE
PROTEIN: NEGATIVE mg/dL
SQUAMOUS EPITHELIAL / LPF: NONE SEEN (ref 0–5)
Specific Gravity, Urine: 1.011 (ref 1.005–1.030)
pH: 5 (ref 5.0–8.0)

## 2018-04-04 NOTE — ED Notes (Signed)
Pt's daughter reports that she is 30 mins away and will get her father.

## 2018-04-04 NOTE — ED Notes (Signed)
Pt brought a tray

## 2018-04-04 NOTE — ED Provider Notes (Signed)
[facility] Emergency Department Provider Note  ____________________________________________   I have reviewed the triage vital signs and the nursing notes. Where available I have reviewed prior notes and, if possible and indicated, outside hospital notes.    HISTORY  Chief Complaint Urinary Retention    HPI Bobby Krueger is a 84 y.o. male  with a history of dementia, poor historian, has no complaints himself per EMS he has had limited urine output today.  Patient states he feels "okay".  Level 5 chart caveat; no further history available due to patient status.      Past Medical History:  Diagnosis Date  . A-fib (HCC)   . 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 03/2014: EF 35%, normal nuc in 05/2014 b. echo 11/2015: EF 40-45% w/ diffuse HK and Grade 1 DD  . COPD (chronic obstructive pulmonary disease) (HCC)   . Diabetes mellitus without complication (HCC)   . High cholesterol   . Hypertension     Patient Active Problem List   Diagnosis Date Noted  . Weakness 06/10/2016  . Cerebral thrombosis with cerebral infarction 03/23/2016  . Acute ischemic stroke (HCC) 03/23/2016  . Acute CVA (cerebrovascular accident) (HCC) 03/23/2016  . Pain in the chest 03/22/2016  . Dizziness 03/22/2016  . Abdominal pain 03/22/2016  . Essential hypertension 03/22/2016  . Chronic combined systolic (congestive) and diastolic (congestive) heart failure (HCC)   . 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 Bradycardia 02/10/2016  . Atrial fibrillation (HCC) 11/19/2015  . CAD (coronary artery disease) 11/16/2015  . COPD (chronic obstructive pulmonary disease) (HCC) 11/16/2015  . GERD (gastroesophageal reflux disease) 11/16/2015    Past Surgical History:  Procedure Laterality Date  . BACK SURGERY    . CARPAL TUNNEL RELEASE    . CHOLECYSTECTOMY      Prior to Admission  medications   Medication Sig Start Date End Date Taking? Authorizing Provider  albuterol (PROAIR HFA) 108 (90 Base) MCG/ACT inhaler Inhale 2 puffs into the lungs every 4 (four) hours as needed for wheezing or shortness of breath.     [provider]  apixaban (ELIQUIS) 5 MG TABS tablet Take 1 tablet (5 mg total) by mouth 2 (two) times daily. 11/21/15   [name], MD  atorvastatin (LIPITOR) 20 MG tablet Take 1 tablet (20 mg total) by mouth daily at 6 PM. 03/26/16   [name], MD  azithromycin (ZITHROMAX Z-PAK) 250 MG tablet 1 tablet (250 mg) once daily on Days 2 through 5. 03/25/18   Emily Filbert, MD  brimonidine (ALPHAGAN) 0.2 % ophthalmic solution Place 1 drop into both eyes daily.     [provider]  carvedilol (COREG) 6.25 MG tablet Take 6.25 mg by mouth 2 (two) times daily with a meal.     [provider]  Fluticasone-Salmeterol (ADVAIR DISKUS) 250-50 MCG/DOSE AEPB Inhale 1 puff into the lungs daily as needed (for wheezing or shortness).     [provider]  furosemide (LASIX) 40 MG tablet Take 1 tablet (40 mg total) by mouth daily. 02/23/16   Ngetich, Dinah C, NP  gabapentin (NEURONTIN) 300 MG capsule Take 300 mg by mouth at bedtime.    [provider]  ibuprofen (ADVIL,MOTRIN) 400 MG tablet Take 400 mg by mouth every 6 (six) hours as needed for moderate pain.    [provider]  insulin glargine (LANTUS) 100 UNIT/ML injection Inject 0.08  mLs (8 Units total) into the skin at bedtime. Patient taking differently: Inject 10 Units into the skin at bedtime.  02/14/16   Hower, Cletis Athens, MD  insulin lispro (HUMALOG) 100 UNIT/ML injection Inject 0-9 Units into the skin 3 (three) times daily before meals. CBG's 70-120 = 0 units; 121-150 =1 units; 151-200 = 2 units; 201-250 =3 units; 251-300 =5 units; 301-350 =7 units; 351-400=9 units and > 400 Notify provider.    [provider]  ipratropium-albuterol (DUONEB) 0.5-2.5 (3) MG/3ML SOLN  Take 3 mLs by nebulization every 6 (six) hours as needed (for SOB).    [provider]  latanoprost (XALATAN) 0.005 % ophthalmic solution Place 1 drop into both eyes at bedtime.    [provider]  losartan (COZAAR) 100 MG tablet Take 100 mg by mouth daily.    [provider]  nateglinide (STARLIX) 120 MG tablet Take 120 mg by mouth daily. 10/02/15   [provider]  Omega-3 1000 MG CAPS Take 1 g by mouth daily.    [provider]  omeprazole (PRILOSEC) 20 MG capsule Take 20 mg by mouth daily.    [provider]  polyvinyl alcohol (LIQUIFILM TEARS) 1.4 % ophthalmic solution Place 1 drop into both eyes as needed for dry eyes. Reported on 12/21/2015    [provider]  potassium chloride SA (K-DUR,KLOR-CON) 20 MEQ tablet Take 20 mEq by mouth daily.    [provider]  SPIRIVA HANDIHALER 18 MCG inhalation capsule Place 1 application into inhaler and inhale daily. 10/13/15   [provider]  terazosin (HYTRIN) 5 MG capsule Take 5 mg by mouth every evening.    [provider]  triamcinolone cream (KENALOG) 0.1 % Apply 1 application topically 2 (two) times daily as needed (for irritation).    [provider]  trimethoprim-polymyxin b (POLYTRIM) ophthalmic solution Place 2 drops into the right eye every 6 (six) hours. Patient not taking: Reported on 06/09/2016 02/02/16   Cuthriell, Delorise Royals, PA-C    Allergies Aldactone [spironolactone]; Aspirin; Ciprofloxacin; Cyclobenzaprine; and Lexapro [escitalopram]  Family History  Problem Relation Age of Onset  . Prostate cancer Unknown     Social History Social History   Tobacco Use  . Smoking status: Former Games developer  . Smokeless tobacco: Never Used  Substance Use Topics  . Alcohol use: No  . Drug use: No    Review of Systems Level 5 chart caveat; no further history available due to patient  status.  ____________________________________________   PHYSICAL EXAM:  VITAL SIGNS: ED Triage Vitals  Enc Vitals Group     BP 04/04/18 1654 (!) 148/81     Pulse Rate 04/04/18 1654 68     Resp 04/04/18 1654 17     Temp 04/04/18 1654 98.1 F (36.7 C)     Temp Source 04/04/18 1654 Oral     SpO2 04/04/18 1654 93 %     Weight 04/04/18 1655 194 lb (88 kg)     Height 04/04/18 1655 5\' 6"  (1.676 m)     Head Circumference --      Peak Flow --      Pain Score 04/04/18 1655 0     Pain Loc --      Pain Edu? --      Excl. in GC? 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 Grossly normal heart sounds.  Good peripheral circulation. Respiratory: Normal respiratory effort.  No retractions. Lungs CTAB. Abdominal: Soft and pubic fullness noted mild tenderness. No distention. No guarding no rebound Back:  There is no focal tenderness or step off.  there is no midline tenderness there are no lesions noted. there is no CVA tenderness Musculoskeletal: No lower extremity tenderness, no upper extremity tenderness. No joint effusions, no DVT signs strong distal pulses no edema Neurologic:  Normal speech and language. No gross focal neurologic deficits are appreciated.  Skin:  Skin is warm, dry and intact. No rash noted. Psychiatric: Mood and affect are normal. Speech and behavior are normal.  ____________________________________________   LABS (all labs ordered are listed, but only abnormal results are displayed)  Labs Reviewed  CBC WITH DIFFERENTIAL/PLATELET - Abnormal; Notable for the following components:      Result Value   RBC 3.54 (*)    Hemoglobin 10.6 (*)    HCT 32.0 (*)    RDW 14.9 (*)    Lymphs Abs 0.8 (*)    All other components within normal limits  BASIC METABOLIC PANEL - Abnormal; Notable for the following components:   Potassium 3.3 (*)    Chloride 93 (*)    CO2 40 (*)    Glucose, Bld 196  (*)    Calcium 8.6 (*)    GFR calc non Af Amer 57 (*)    All other components within normal limits  URINALYSIS, COMPLETE (UACMP) WITH MICROSCOPIC    Pertinent labs  results that were available during my care of the patient were reviewed by me and considered in my medical decision making (see chart for details). ____________________________________________  EKG  I personally interpreted any EKGs ordered by me or triage  ____________________________________________  RADIOLOGY  Pertinent labs & imaging results that were available during my care of the patient were reviewed by me and considered in my medical decision making (see chart for details). If possible, patient and/or family made aware of any abnormal findings.  No results found. ____________________________________________    PROCEDURES  Procedure(s) performed: None  Procedures  Critical Care performed: None  ____________________________________________   INITIAL IMPRESSION / ASSESSMENT AND PLAN / ED COURSE  Pertinent labs & imaging results that were available during my care of the patient were reviewed by me and considered in my medical decision making (see chart for details).  She with evidence of urinary retention clinically, bladder scanner suggest the patient is over 600 cc of urine, we placed a Foley did have good result.  Patient nontoxic in appearance.  Checking basic blood work including kidney function which is all reassuring at this time.  Patient will likely need to be discharged with a Foley catheter plus or minus antibiotics if his UA shows UTI, and we will have him closely follow-up with urology.    ____________________________________________   FINAL CLINICAL IMPRESSION(S) / ED DIAGNOSES  Final diagnoses:  None      This chart was dictated using voice recognition software.  Despite best efforts to proofread,  errors can occur which can change meaning.      Jeanmarie Plant, MD 04/04/18  [phone]

## 2018-04-04 NOTE — ED Notes (Signed)
Foley emptied

## 2018-04-04 NOTE — ED Notes (Signed)
To call daughter Lawanna Kobus) at [phone] if something changes

## 2018-04-04 NOTE — ED Triage Notes (Addendum)
Pt's family states he has had trouble urinating. Hx of dementia. Daily 60mg  furosemide. 148/80 HR 71 96% on 2L. COPD and kidney stones hx. Family reports he has urinated about 76ml today. Oxygen 2L chronic at home.

## 2018-04-08 ENCOUNTER — Emergency Department
Admission: EM | Admit: 2018-04-08 | Discharge: 2018-04-08 | Disposition: A | Discharge status: Home / Self Care | Payer: Medicare Other | Attending: Emergency Medicine | Admitting: Emergency Medicine

## 2018-04-08 ENCOUNTER — Other Ambulatory Visit: Payer: Self-pay

## 2018-04-08 DIAGNOSIS — Z8673 Personal history of transient ischemic attack (TIA), and cerebral infarction without residual deficits: Secondary | ICD-10-CM | POA: Insufficient documentation

## 2018-04-08 DIAGNOSIS — I110 Hypertensive heart disease with heart failure: Secondary | ICD-10-CM | POA: Insufficient documentation

## 2018-04-08 DIAGNOSIS — Z7901 Long term (current) use of anticoagulants: Secondary | ICD-10-CM | POA: Diagnosis not present

## 2018-04-08 DIAGNOSIS — Z87891 Personal history of nicotine dependence: Secondary | ICD-10-CM | POA: Insufficient documentation

## 2018-04-08 DIAGNOSIS — J449 Chronic obstructive pulmonary disease, unspecified: Secondary | ICD-10-CM | POA: Insufficient documentation

## 2018-04-08 DIAGNOSIS — I5042 Chronic combined systolic (congestive) and diastolic (congestive) heart failure: Secondary | ICD-10-CM | POA: Diagnosis not present

## 2018-04-08 DIAGNOSIS — Z79899 Other long term (current) drug therapy: Secondary | ICD-10-CM | POA: Diagnosis not present

## 2018-04-08 DIAGNOSIS — F0390 Unspecified dementia without behavioral disturbance: Secondary | ICD-10-CM | POA: Diagnosis not present

## 2018-04-08 DIAGNOSIS — R319 Hematuria, unspecified: Secondary | ICD-10-CM

## 2018-04-08 DIAGNOSIS — Z794 Long term (current) use of insulin: Secondary | ICD-10-CM | POA: Diagnosis not present

## 2018-04-08 DIAGNOSIS — R339 Retention of urine, unspecified: Secondary | ICD-10-CM | POA: Diagnosis not present

## 2018-04-08 DIAGNOSIS — E119 Type 2 diabetes mellitus without complications: Secondary | ICD-10-CM | POA: Insufficient documentation

## 2018-04-08 LAB — CBC WITH DIFFERENTIAL/PLATELET
Basophils Absolute: 0 10*3/uL (ref 0–0.1)
Basophils Relative: 1 %
EOS ABS: 0.1 10*3/uL (ref 0–0.7)
Eosinophils Relative: 2 %
HEMATOCRIT: 34.2 % — AB (ref 40.0–52.0)
HEMOGLOBIN: 11.7 g/dL — AB (ref 13.0–18.0)
LYMPHS ABS: 0.9 10*3/uL — AB (ref 1.0–3.6)
LYMPHS PCT: 13 %
MCH: 30.7 pg (ref 26.0–34.0)
MCHC: 34.2 g/dL (ref 32.0–36.0)
MCV: 89.7 fL (ref 80.0–100.0)
Monocytes Absolute: 0.4 10*3/uL (ref 0.2–1.0)
Monocytes Relative: 6 %
NEUTROS ABS: 5.6 10*3/uL (ref 1.4–6.5)
NEUTROS PCT: 78 %
Platelets: 178 10*3/uL (ref 150–440)
RBC: 3.81 MIL/uL — AB (ref 4.40–5.90)
RDW: 14.9 % — ABNORMAL HIGH (ref 11.5–14.5)
WBC: 7.1 10*3/uL (ref 3.8–10.6)

## 2018-04-08 LAB — URINALYSIS, COMPLETE (UACMP) WITH MICROSCOPIC
BILIRUBIN URINE: NEGATIVE
GLUCOSE, UA: NEGATIVE mg/dL
KETONES UR: NEGATIVE mg/dL
Leukocytes, UA: NEGATIVE
Nitrite: NEGATIVE
PH: 8 (ref 5.0–8.0)
Protein, ur: 100 mg/dL — AB
Specific Gravity, Urine: 1.016 (ref 1.005–1.030)
Squamous Epithelial / LPF: NONE SEEN (ref 0–5)
WBC UA: NONE SEEN WBC/hpf (ref 0–5)

## 2018-04-08 LAB — BASIC METABOLIC PANEL
Anion gap: 8 (ref 5–15)
BUN: 14 mg/dL (ref 8–23)
CHLORIDE: 96 mmol/L — AB (ref 98–111)
CO2: 41 mmol/L — AB (ref 22–32)
Calcium: 9.5 mg/dL (ref 8.9–10.3)
Creatinine, Ser: 0.97 mg/dL (ref 0.61–1.24)
GFR calc Af Amer: >60 mL/min (ref >60)
GFR calc non Af Amer: >60 mL/min (ref >60)
Glucose, Bld: 71 mg/dL (ref 70–99)
POTASSIUM: 3.6 mmol/L (ref 3.5–5.1)
SODIUM: 145 mmol/L (ref 135–145)

## 2018-04-08 NOTE — ED Notes (Signed)
Called lab and lab verified they would add on urine culture to urine already in lab.

## 2018-04-08 NOTE — ED Triage Notes (Signed)
Pt comes via [facility] from home with c/o hematuria per family. Pt was seen here yesterday for pulling out his cathather. Pt states pain all over. EMS reports pt's BS-65. They gave oral glucose and recheck was 71. VSS. MD at bedside. Pt is Alert

## 2018-04-08 NOTE — ED Notes (Signed)
Pt given water ok per MD 

## 2018-04-08 NOTE — ED Notes (Signed)
ED Provider at bedside. 

## 2018-04-08 NOTE — ED Provider Notes (Signed)
[facility] Emergency Department Provider Note  ____________________________________________   I have reviewed the triage vital signs and the nursing notes. Where available I have reviewed prior notes and, if possible and indicated, outside hospital notes.    HISTORY  Chief Complaint Hematuria    HPI Bobby Krueger is a 84 y.o. male some some degree of dementia, he is here today because they noticed blood in his Foley, patient is on Eliquis.  No known trauma, still draining no fevers, acting normally otherwise.  I did place a Foley catheter in this patient 4 days ago because of urinary retention.  Does have a history of enlarged prostate.  Is yet to follow-up with urology as far as the daughter who is here with him knows.  History as per patient which is limited and daughter.  Patient has no complaints.     Past Medical History:  Diagnosis Date  . A-fib (HCC)   . 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 03/2014: EF 35%, normal nuc in 05/2014 b. echo 11/2015: EF 40-45% w/ diffuse HK and Grade 1 DD  . COPD (chronic obstructive pulmonary disease) (HCC)   . Diabetes mellitus without complication (HCC)   . High cholesterol   . Hypertension     Patient Active Problem List   Diagnosis Date Noted  . Weakness 06/10/2016  . Cerebral thrombosis with cerebral infarction 03/23/2016  . Acute ischemic stroke (HCC) 03/23/2016  . Acute CVA (cerebrovascular accident) (HCC) 03/23/2016  . Pain in the chest 03/22/2016  . Dizziness 03/22/2016  . Abdominal pain 03/22/2016  . Essential hypertension 03/22/2016  . Chronic combined systolic (congestive) and diastolic (congestive) heart failure (HCC)   . 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 Bradycardia 02/10/2016  . Atrial fibrillation (HCC) 11/19/2015  . CAD (coronary artery disease) 11/16/2015  . COPD (chronic obstructive  pulmonary disease) (HCC) 11/16/2015  . GERD (gastroesophageal reflux disease) 11/16/2015    Past Surgical History:  Procedure Laterality Date  . BACK SURGERY    . CARPAL TUNNEL RELEASE    . CHOLECYSTECTOMY      Prior to Admission medications   Medication Sig Start Date End Date Taking? Authorizing Provider  albuterol (PROAIR HFA) 108 (90 Base) MCG/ACT inhaler Inhale 2 puffs into the lungs every 4 (four) hours as needed for wheezing or shortness of breath.     [provider]  apixaban (ELIQUIS) 5 MG TABS tablet Take 1 tablet (5 mg total) by mouth 2 (two) times daily. 11/21/15   [name], MD  atorvastatin (LIPITOR) 20 MG tablet Take 1 tablet (20 mg total) by mouth daily at 6 PM. 03/26/16   [name], MD  azithromycin (ZITHROMAX Z-PAK) 250 MG tablet 1 tablet (250 mg) once daily on Days 2 through 5. 03/25/18   Emily Filbert, MD  brimonidine (ALPHAGAN) 0.2 % ophthalmic solution Place 1 drop into both eyes daily.     [provider]  carvedilol (COREG) 6.25 MG tablet Take 6.25 mg by mouth 2 (two) times daily with a meal.     [provider]  Fluticasone-Salmeterol (ADVAIR DISKUS) 250-50 MCG/DOSE AEPB Inhale 1 puff into the lungs daily as needed (for wheezing or shortness).     [provider]  furosemide (LASIX) 40 MG tablet Take 1 tablet (40 mg total) by mouth daily. 02/23/16   Ngetich, Dinah C, NP  gabapentin (NEURONTIN) 300 MG capsule Take  300 mg by mouth at bedtime.    [provider]  ibuprofen (ADVIL,MOTRIN) 400 MG tablet Take 400 mg by mouth every 6 (six) hours as needed for moderate pain.    [provider]  insulin glargine (LANTUS) 100 UNIT/ML injection Inject 0.08 mLs (8 Units total) into the skin at bedtime. Patient taking differently: Inject 10 Units into the skin at bedtime.  02/14/16   Hower, Cletis Athens, MD  insulin lispro (HUMALOG) 100 UNIT/ML injection Inject 0-9 Units into the skin 3 (three) times daily before meals.  CBG's 70-120 = 0 units; 121-150 =1 units; 151-200 = 2 units; 201-250 =3 units; 251-300 =5 units; 301-350 =7 units; 351-400=9 units and > 400 Notify provider.    [provider]  ipratropium-albuterol (DUONEB) 0.5-2.5 (3) MG/3ML SOLN Take 3 mLs by nebulization every 6 (six) hours as needed (for SOB).    [provider]  latanoprost (XALATAN) 0.005 % ophthalmic solution Place 1 drop into both eyes at bedtime.    [provider]  losartan (COZAAR) 100 MG tablet Take 100 mg by mouth daily.    [provider]  nateglinide (STARLIX) 120 MG tablet Take 120 mg by mouth daily. 10/02/15   [provider]  Omega-3 1000 MG CAPS Take 1 g by mouth daily.    [provider]  omeprazole (PRILOSEC) 20 MG capsule Take 20 mg by mouth daily.    [provider]  polyvinyl alcohol (LIQUIFILM TEARS) 1.4 % ophthalmic solution Place 1 drop into both eyes as needed for dry eyes. Reported on 12/21/2015    [provider]  potassium chloride SA (K-DUR,KLOR-CON) 20 MEQ tablet Take 20 mEq by mouth daily.    [provider]  SPIRIVA HANDIHALER 18 MCG inhalation capsule Place 1 application into inhaler and inhale daily. 10/13/15   [provider]  terazosin (HYTRIN) 5 MG capsule Take 5 mg by mouth every evening.    [provider]  triamcinolone cream (KENALOG) 0.1 % Apply 1 application topically 2 (two) times daily as needed (for irritation).    [provider]  trimethoprim-polymyxin b (POLYTRIM) ophthalmic solution Place 2 drops into the right eye every 6 (six) hours. Patient not taking: Reported on 06/09/2016 02/02/16   Cuthriell, Delorise Royals, PA-C    Allergies Aldactone [spironolactone]; Aspirin; Ciprofloxacin; Cyclobenzaprine; and Lexapro [escitalopram]  Family History  Problem Relation Age of Onset  . Prostate cancer Unknown     Social History Social History   Tobacco Use  . Smoking status: Former Games developer  .  Smokeless tobacco: Never Used  Substance Use Topics  . Alcohol use: No  . Drug use: No    Review of Systems Constitutional: No fever/chills Eyes: No visual changes. ENT: No sore throat. No stiff neck no neck pain Cardiovascular: Denies chest pain. Respiratory: Denies shortness of breath. Gastrointestinal:   no vomiting.  No diarrhea.  No constipation. Genitourinary: Negative for dysuria. Musculoskeletal: Negative lower extremity swelling Skin: Negative for rash. Neurological: Negative for severe headaches, focal weakness or numbness.   ____________________________________________   PHYSICAL EXAM:  VITAL SIGNS: ED Triage Vitals  Enc Vitals Group     BP 04/08/18 1139 (!) 136/116     Pulse Rate 04/08/18 1139 71     Resp 04/08/18 1139 18     Temp 04/08/18 1139 (!) 97.5 F (36.4 C)     Temp Source 04/08/18 1139 Oral     SpO2 04/08/18 1139 94 %     Weight 04/08/18 1141  194 lb (88 kg)     Height 04/08/18 1141 5\' 6"  (1.676 m)     Head Circumference --      Peak Flow --      Pain Score 04/08/18 1140 5     Pain Loc --      Pain Edu? --      Excl. in GC? --     Constitutional: Alert and pleasantly demented, can tell me about his time in the [employer] and in the [employer] Eyes: Conjunctivae are normal Head: Atraumatic HEENT: No congestion/rhinnorhea. Mucous membranes are moist.  Oropharynx non-erythematous Neck:   Nontender with no meningismus, no masses, no stridor Cardiovascular: Normal rate, regular rhythm. Grossly normal heart sounds.  Good peripheral circulation. Respiratory: Normal respiratory effort.  No retractions. Lungs CTAB. Abdominal: Soft and nontender. No distention. No guarding no rebound Back:  There is no focal tenderness or step off.  there is no midline tenderness there are no lesions noted. there is no CVA tenderness GU: No lesions noted, Foley in place which is draining blood-tinged urine. Musculoskeletal: No lower extremity tenderness, no upper extremity  tenderness. No joint effusions, no DVT signs strong distal pulses no edema Skin:  Skin is warm, dry and intact. No rash noted. Psychiatric: Mood and affect are normal. Speech and behavior are normal.  ____________________________________________   LABS (all labs ordered are listed, but only abnormal results are displayed)  Labs Reviewed  CBC WITH DIFFERENTIAL/PLATELET  BASIC METABOLIC PANEL  URINALYSIS, COMPLETE (UACMP) WITH MICROSCOPIC    Pertinent labs  results that were available during my care of the patient were reviewed by me and considered in my medical decision making (see chart for details). ____________________________________________  EKG  I personally interpreted any EKGs ordered by me or triage  ____________________________________________  RADIOLOGY  Pertinent labs & imaging results that were available during my care of the patient were reviewed by me and considered in my medical decision making (see chart for details). If possible, patient and/or family made aware of any abnormal findings.  No results found. ____________________________________________    PROCEDURES  Procedure(s) performed: None  Procedures  Critical Care performed: None  ____________________________________________   INITIAL IMPRESSION / ASSESSMENT AND PLAN / ED COURSE  Pertinent labs & imaging results that were available during my care of the patient were reviewed by me and considered in my medical decision making (see chart for details).  Patient here with palpitations from his Foley catheter, he is on Eliquis is not surprising there would be some blood in there.  We will check CBC, kidney function urinalysis and reassess    ____________________________________________   FINAL CLINICAL IMPRESSION(S) / ED DIAGNOSES  Final diagnoses:  None      This chart was dictated using voice recognition software.  Despite best efforts to proofread,  errors can occur which can change  meaning.      Jeanmarie Plant, MD 04/08/18 [phone]

## 2018-04-09 LAB — URINE CULTURE: CULTURE: NO GROWTH

## 2018-04-14 ENCOUNTER — Encounter: Payer: Self-pay | Admitting: Urology

## 2018-04-14 ENCOUNTER — Ambulatory Visit: Payer: Medicare Other | Admitting: Urology

## 2018-06-23 ENCOUNTER — Inpatient Hospital Stay
Admission: EM | Admit: 2018-06-23 | Discharge: 2018-06-29 | DRG: 291 | Disposition: A | Discharge status: Skilled Nursing Facility | Payer: Medicare Other | Source: Skilled Nursing Facility | Attending: Internal Medicine | Admitting: Internal Medicine

## 2018-06-23 ENCOUNTER — Emergency Department: Payer: Medicare Other

## 2018-06-23 ENCOUNTER — Other Ambulatory Visit: Payer: Self-pay

## 2018-06-23 DIAGNOSIS — E119 Type 2 diabetes mellitus without complications: Secondary | ICD-10-CM | POA: Diagnosis present

## 2018-06-23 DIAGNOSIS — Z881 Allergy status to other antibiotic agents status: Secondary | ICD-10-CM

## 2018-06-23 DIAGNOSIS — I4892 Unspecified atrial flutter: Secondary | ICD-10-CM | POA: Diagnosis present

## 2018-06-23 DIAGNOSIS — G9341 Metabolic encephalopathy: Secondary | ICD-10-CM | POA: Diagnosis present

## 2018-06-23 DIAGNOSIS — I639 Cerebral infarction, unspecified: Secondary | ICD-10-CM

## 2018-06-23 DIAGNOSIS — Z8673 Personal history of transient ischemic attack (TIA), and cerebral infarction without residual deficits: Secondary | ICD-10-CM

## 2018-06-23 DIAGNOSIS — Z794 Long term (current) use of insulin: Secondary | ICD-10-CM

## 2018-06-23 DIAGNOSIS — R4182 Altered mental status, unspecified: Secondary | ICD-10-CM | POA: Diagnosis not present

## 2018-06-23 DIAGNOSIS — I480 Paroxysmal atrial fibrillation: Secondary | ICD-10-CM | POA: Diagnosis present

## 2018-06-23 DIAGNOSIS — Z888 Allergy status to other drugs, medicaments and biological substances status: Secondary | ICD-10-CM | POA: Diagnosis not present

## 2018-06-23 DIAGNOSIS — J440 Chronic obstructive pulmonary disease with acute lower respiratory infection: Secondary | ICD-10-CM | POA: Diagnosis present

## 2018-06-23 DIAGNOSIS — E7800 Pure hypercholesterolemia, unspecified: Secondary | ICD-10-CM | POA: Diagnosis present

## 2018-06-23 DIAGNOSIS — Z87891 Personal history of nicotine dependence: Secondary | ICD-10-CM | POA: Diagnosis not present

## 2018-06-23 DIAGNOSIS — F0391 Unspecified dementia with behavioral disturbance: Secondary | ICD-10-CM | POA: Diagnosis present

## 2018-06-23 DIAGNOSIS — E876 Hypokalemia: Secondary | ICD-10-CM | POA: Diagnosis present

## 2018-06-23 DIAGNOSIS — Z7901 Long term (current) use of anticoagulants: Secondary | ICD-10-CM

## 2018-06-23 DIAGNOSIS — Z79899 Other long term (current) drug therapy: Secondary | ICD-10-CM | POA: Diagnosis not present

## 2018-06-23 DIAGNOSIS — E871 Hypo-osmolality and hyponatremia: Secondary | ICD-10-CM | POA: Diagnosis not present

## 2018-06-23 DIAGNOSIS — G4733 Obstructive sleep apnea (adult) (pediatric): Secondary | ICD-10-CM | POA: Diagnosis present

## 2018-06-23 DIAGNOSIS — F03918 Unspecified dementia, unspecified severity, with other behavioral disturbance: Secondary | ICD-10-CM

## 2018-06-23 DIAGNOSIS — G9340 Encephalopathy, unspecified: Secondary | ICD-10-CM | POA: Diagnosis present

## 2018-06-23 DIAGNOSIS — R0602 Shortness of breath: Secondary | ICD-10-CM

## 2018-06-23 DIAGNOSIS — I110 Hypertensive heart disease with heart failure: Secondary | ICD-10-CM | POA: Diagnosis not present

## 2018-06-23 DIAGNOSIS — Z7951 Long term (current) use of inhaled steroids: Secondary | ICD-10-CM | POA: Diagnosis not present

## 2018-06-23 DIAGNOSIS — F40240 Claustrophobia: Secondary | ICD-10-CM | POA: Diagnosis present

## 2018-06-23 DIAGNOSIS — R609 Edema, unspecified: Secondary | ICD-10-CM

## 2018-06-23 DIAGNOSIS — I2510 Atherosclerotic heart disease of native coronary artery without angina pectoris: Secondary | ICD-10-CM | POA: Diagnosis present

## 2018-06-23 DIAGNOSIS — Z886 Allergy status to analgesic agent status: Secondary | ICD-10-CM | POA: Diagnosis not present

## 2018-06-23 DIAGNOSIS — N400 Enlarged prostate without lower urinary tract symptoms: Secondary | ICD-10-CM | POA: Diagnosis present

## 2018-06-23 DIAGNOSIS — T502X5A Adverse effect of carbonic-anhydrase inhibitors, benzothiadiazides and other diuretics, initial encounter: Secondary | ICD-10-CM | POA: Diagnosis present

## 2018-06-23 DIAGNOSIS — K219 Gastro-esophageal reflux disease without esophagitis: Secondary | ICD-10-CM | POA: Diagnosis present

## 2018-06-23 DIAGNOSIS — R0603 Acute respiratory distress: Secondary | ICD-10-CM | POA: Diagnosis present

## 2018-06-23 DIAGNOSIS — I5043 Acute on chronic combined systolic (congestive) and diastolic (congestive) heart failure: Secondary | ICD-10-CM | POA: Diagnosis present

## 2018-06-23 DIAGNOSIS — E785 Hyperlipidemia, unspecified: Secondary | ICD-10-CM | POA: Diagnosis present

## 2018-06-23 DIAGNOSIS — J189 Pneumonia, unspecified organism: Secondary | ICD-10-CM | POA: Diagnosis present

## 2018-06-23 DIAGNOSIS — I509 Heart failure, unspecified: Secondary | ICD-10-CM

## 2018-06-23 DIAGNOSIS — Z9049 Acquired absence of other specified parts of digestive tract: Secondary | ICD-10-CM

## 2018-06-23 LAB — COMPREHENSIVE METABOLIC PANEL
ALK PHOS: 100 U/L (ref 38–126)
ALT: 18 U/L (ref 0–44)
ANION GAP: 8 (ref 5–15)
AST: 23 U/L (ref 15–41)
Albumin: 3.2 g/dL — ABNORMAL LOW (ref 3.5–5.0)
BUN: 14 mg/dL (ref 8–23)
CALCIUM: 8.9 mg/dL (ref 8.9–10.3)
CO2: 35 mmol/L — AB (ref 22–32)
CREATININE: 0.99 mg/dL (ref 0.61–1.24)
Chloride: 99 mmol/L (ref 98–111)
GFR calc non Af Amer: >60 mL/min (ref >60)
Glucose, Bld: 228 mg/dL — ABNORMAL HIGH (ref 70–99)
Potassium: 2.7 mmol/L — CL (ref 3.5–5.1)
SODIUM: 142 mmol/L (ref 135–145)
TOTAL PROTEIN: 7 g/dL (ref 6.5–8.1)
Total Bilirubin: 0.5 mg/dL (ref 0.3–1.2)

## 2018-06-23 LAB — CBC
HCT: 35.6 % — ABNORMAL LOW (ref 39.0–52.0)
Hemoglobin: 11 g/dL — ABNORMAL LOW (ref 13.0–17.0)
MCH: 27.7 pg (ref 26.0–34.0)
MCHC: 30.9 g/dL (ref 30.0–36.0)
MCV: 89.7 fL (ref 80.0–100.0)
NRBC: 0.3 % — AB (ref 0.0–0.2)
PLATELETS: 159 10*3/uL (ref 150–400)
RBC: 3.97 MIL/uL — AB (ref 4.22–5.81)
RDW: 14.2 % (ref 11.5–15.5)
WBC: 5.9 10*3/uL (ref 4.0–10.5)

## 2018-06-23 LAB — URINALYSIS, COMPLETE (UACMP) WITH MICROSCOPIC
BILIRUBIN URINE: NEGATIVE
GLUCOSE, UA: 150 mg/dL — AB
Ketones, ur: NEGATIVE mg/dL
LEUKOCYTES UA: NEGATIVE
NITRITE: NEGATIVE
Protein, ur: 100 mg/dL — AB
SPECIFIC GRAVITY, URINE: 1.017 (ref 1.005–1.030)
pH: 6 (ref 5.0–8.0)

## 2018-06-23 LAB — MAGNESIUM: MAGNESIUM: 1.7 mg/dL (ref 1.7–2.4)

## 2018-06-23 LAB — TROPONIN I: Troponin I: 0.05 ng/mL (ref <0.03)

## 2018-06-23 LAB — GLUCOSE, CAPILLARY: GLUCOSE-CAPILLARY: 186 mg/dL — AB (ref 70–99)

## 2018-06-23 MED ORDER — LORAZEPAM 2 MG/ML IJ SOLN
1.0000 mg | Freq: Once | INTRAMUSCULAR | Status: AC
  Administered 2018-06-23: 1 mg via INTRAVENOUS
  Filled 2018-06-23: qty 1

## 2018-06-23 MED ORDER — OLANZAPINE 10 MG IM SOLR
5.0000 mg | Freq: Once | INTRAMUSCULAR | Status: AC
  Administered 2018-06-24: 5 mg via INTRAMUSCULAR
  Filled 2018-06-23 (×2): qty 10

## 2018-06-23 MED ORDER — DIPHENHYDRAMINE HCL 50 MG/ML IJ SOLN
50.0000 mg | Freq: Once | INTRAMUSCULAR | Status: AC
  Administered 2018-06-23: 50 mg via INTRAVENOUS

## 2018-06-23 MED ORDER — DILTIAZEM HCL 25 MG/5ML IV SOLN
10.0000 mg | Freq: Once | INTRAVENOUS | Status: AC
  Administered 2018-06-23: 10 mg via INTRAVENOUS
  Filled 2018-06-23: qty 5

## 2018-06-23 MED ORDER — IPRATROPIUM-ALBUTEROL 0.5-2.5 (3) MG/3ML IN SOLN
3.0000 mL | Freq: Four times a day (QID) | RESPIRATORY_TRACT | Status: DC
  Administered 2018-06-23 – 2018-06-24 (×2): 3 mL via RESPIRATORY_TRACT
  Filled 2018-06-23 (×2): qty 3

## 2018-06-23 MED ORDER — POTASSIUM CHLORIDE CRYS ER 20 MEQ PO TBCR
40.0000 meq | EXTENDED_RELEASE_TABLET | Freq: Once | ORAL | Status: AC
  Administered 2018-06-23: 40 meq via ORAL
  Filled 2018-06-23: qty 2

## 2018-06-23 MED ORDER — HALOPERIDOL LACTATE 5 MG/ML IJ SOLN
2.0000 mg | Freq: Once | INTRAMUSCULAR | Status: AC
  Administered 2018-06-23: 2 mg via INTRAVENOUS

## 2018-06-23 MED ORDER — DIPHENHYDRAMINE HCL 50 MG/ML IJ SOLN
INTRAMUSCULAR | Status: AC
  Administered 2018-06-23: 50 mg via INTRAVENOUS
  Filled 2018-06-23: qty 1

## 2018-06-23 MED ORDER — HALOPERIDOL LACTATE 5 MG/ML IJ SOLN
INTRAMUSCULAR | Status: AC
  Administered 2018-06-23: 2 mg via INTRAVENOUS
  Filled 2018-06-23: qty 1

## 2018-06-23 MED ORDER — FUROSEMIDE 10 MG/ML IJ SOLN
40.0000 mg | Freq: Once | INTRAMUSCULAR | Status: AC
  Administered 2018-06-23: 40 mg via INTRAVENOUS
  Filled 2018-06-23: qty 4

## 2018-06-23 NOTE — ED Notes (Signed)
Leaving and on-coming EDP [facility] notified in person of Trop 0.05.

## 2018-06-23 NOTE — ED Notes (Signed)
Admitting provider contacted. Notified of pt's wheezing and inc HR while sitting in bed. Edema noted in lower legs.

## 2018-06-23 NOTE — ED Notes (Signed)
Pt urinated all over the floor, confused, actively trying to climb out of bed.

## 2018-06-23 NOTE — ED Notes (Signed)
Pt wheezing.

## 2018-06-23 NOTE — ED Notes (Signed)
Pt HOH.

## 2018-06-23 NOTE — ED Notes (Signed)
Dr Derrill Kay notified of pts potassium 2.7 in person. No new orders

## 2018-06-23 NOTE — ED Notes (Addendum)
Bobby Krueger notified in person that pt is complaining of chest/shoulder/leg pain. No new orders @ this time.

## 2018-06-23 NOTE — ED Notes (Addendum)
Pt calling out to hallway. Confused. Educated what the oxygen monitor does and to leave it on. Bed in lowest position. Door open.

## 2018-06-23 NOTE — ED Provider Notes (Signed)
[facility] Emergency Department Provider Note   ____________________________________________   I have reviewed the triage vital signs and the nursing notes.   HISTORY  Chief Complaint Altered Mental Status   History limited by: Altered Mental Status   HPI Bobby Krueger is a 84 y.o. male who presents to the emergency department today because of concern for altered mental status by family. Unfortunately family is not bedside so no history is obtained from them at this time. The patient himself denies any concerns. He states that he would like to leave.  He is oriented to location but not year. Denies any chest pain, shortness of breath, headache, fevers.   Per medical record review patient has a history of CVA  Past Medical History:  Diagnosis Date  . A-fib (HCC)   . 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 03/2014: EF 35%, normal nuc in 05/2014 b. echo 11/2015: EF 40-45% w/ diffuse HK and Grade 1 DD  . COPD (chronic obstructive pulmonary disease) (HCC)   . Diabetes mellitus without complication (HCC)   . High cholesterol   . Hypertension     Patient Active Problem List   Diagnosis Date Noted  . Weakness 06/10/2016  . Cerebral thrombosis with cerebral infarction 03/23/2016  . Acute ischemic stroke (HCC) 03/23/2016  . Acute CVA (cerebrovascular accident) (HCC) 03/23/2016  . Pain in the chest 03/22/2016  . Dizziness 03/22/2016  . Abdominal pain 03/22/2016  . Essential hypertension 03/22/2016  . Chronic combined systolic (congestive) and diastolic (congestive) heart failure (HCC)   . 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 Bradycardia 02/10/2016  . Atrial fibrillation (HCC) 11/19/2015  . CAD (coronary artery disease) 11/16/2015  . COPD (chronic obstructive pulmonary disease) (HCC) 11/16/2015  . GERD (gastroesophageal reflux disease) 11/16/2015    Past  Surgical History:  Procedure Laterality Date  . BACK SURGERY    . CARPAL TUNNEL RELEASE    . CHOLECYSTECTOMY      Prior to Admission medications   Medication Sig Start Date End Date Taking? Authorizing Provider  albuterol (PROAIR HFA) 108 (90 Base) MCG/ACT inhaler Inhale 2 puffs into the lungs every 4 (four) hours as needed for wheezing or shortness of breath.     [provider]  apixaban (ELIQUIS) 5 MG TABS tablet Take 1 tablet (5 mg total) by mouth 2 (two) times daily. 11/21/15   [name], MD  atorvastatin (LIPITOR) 20 MG tablet Take 1 tablet (20 mg total) by mouth daily at 6 PM. 03/26/16   [name], MD  azithromycin (ZITHROMAX Z-PAK) 250 MG tablet 1 tablet (250 mg) once daily on Days 2 through 5. 03/25/18   Emily Filbert, MD  brimonidine (ALPHAGAN) 0.2 % ophthalmic solution Place 1 drop into both eyes daily.     [provider]  carvedilol (COREG) 6.25 MG tablet Take 6.25 mg by mouth 2 (two) times daily with a meal.     [provider]  Fluticasone-Salmeterol (ADVAIR DISKUS) 250-50 MCG/DOSE AEPB Inhale 1 puff into the lungs daily as needed (for wheezing or shortness).     [provider]  furosemide (LASIX) 40 MG tablet Take 1 tablet (40 mg total) by mouth daily. 02/23/16   Ngetich, Dinah C, NP  gabapentin (NEURONTIN) 300 MG capsule Take 300 mg by mouth at bedtime.    [provider]  ibuprofen (ADVIL,MOTRIN) 400 MG tablet Take 400 mg by mouth  every 6 (six) hours as needed for moderate pain.    [provider]  insulin glargine (LANTUS) 100 UNIT/ML injection Inject 0.08 mLs (8 Units total) into the skin at bedtime. Patient taking differently: Inject 10 Units into the skin at bedtime.  02/14/16   Hower, Cletis [place], MD  insulin lispro (HUMALOG) 100 UNIT/ML injection Inject 0-9 Units into the skin 3 (three) times daily before meals. CBG's 70-120 = 0 units; 121-150 =1 units; 151-200 = 2 units; 201-250 =3 units; 251-300 =5 units;  301-350 =7 units; 351-400=9 units and > 400 Notify provider.    [provider]  ipratropium-albuterol (DUONEB) 0.5-2.5 (3) MG/3ML SOLN Take 3 mLs by nebulization every 6 (six) hours as needed (for SOB).    [provider]  latanoprost (XALATAN) 0.005 % ophthalmic solution Place 1 drop into both eyes at bedtime.    [provider]  losartan (COZAAR) 100 MG tablet Take 100 mg by mouth daily.    [provider]  nateglinide (STARLIX) 120 MG tablet Take 120 mg by mouth daily. 10/02/15   [provider]  Omega-3 1000 MG CAPS Take 1 g by mouth daily.    [provider]  omeprazole (PRILOSEC) 20 MG capsule Take 20 mg by mouth daily.    [provider]  polyvinyl alcohol (LIQUIFILM TEARS) 1.4 % ophthalmic solution Place 1 drop into both eyes as needed for dry eyes. Reported on 12/21/2015    [provider]  potassium chloride SA (K-DUR,KLOR-CON) 20 MEQ tablet Take 20 mEq by mouth daily.    [provider]  SPIRIVA HANDIHALER 18 MCG inhalation capsule Place 1 application into inhaler and inhale daily. 10/13/15   [provider]  terazosin (HYTRIN) 5 MG capsule Take 5 mg by mouth every evening.    [provider]  triamcinolone cream (KENALOG) 0.1 % Apply 1 application topically 2 (two) times daily as needed (for irritation).    [provider]  trimethoprim-polymyxin b (POLYTRIM) ophthalmic solution Place 2 drops into the right eye every 6 (six) hours. Patient not taking: Reported on 06/09/2016 02/02/16   Cuthriell, Delorise Royals, PA-C    Allergies Aldactone [spironolactone]; Aspirin; Ciprofloxacin; Cyclobenzaprine; and Lexapro [escitalopram]  Family History  Problem Relation Age of Onset  . Prostate cancer Unknown     Social History Social History   Tobacco Use  . Smoking status: Former Games developer  . Smokeless tobacco: Never Used  Substance Use Topics  . Alcohol use: No  . Drug use: No     Review of Systems limited secondary to AMS Constitutional: No fever/chills Cardiovascular: Denies chest pain. Respiratory: Denies shortness of breath. Gastrointestinal: No abdominal pain.  No nausea, no vomiting.  No diarrhea.   Neurological: Negative for headaches  ____________________________________________   PHYSICAL EXAM:  VITAL SIGNS: ED Triage Vitals  Enc Vitals Group     BP 06/23/18 1655 (!) 153/86     Pulse Rate 06/23/18 1655 70     Resp 06/23/18 1655 20     Temp 06/23/18 1655 98.4 F (36.9 C)     Temp Source 06/23/18 1655 Oral     SpO2 06/23/18 1655 100 %     Weight 06/23/18 1656 180 lb (81.6 kg)     Height 06/23/18 1656 5\' 6"  (1.676 m)     Head Circumference --      Peak Flow --      Pain Score 06/23/18 1656 0   Constitutional: Awake and alert. Not completely oriented.  Eyes: Conjunctivae are normal.  ENT      Head: Normocephalic and atraumatic.      Nose: No congestion/rhinnorhea.      Mouth/Throat: Mucous membranes are moist.      Neck: No stridor. Hematological/Lymphatic/Immunilogical: No cervical lymphadenopathy. Cardiovascular: Normal rate, regular rhythm.  No murmurs, rubs, or gallops. Respiratory: Normal respiratory effort without tachypnea nor retractions. Breath sounds are clear and equal bilaterally. No wheezes/rales/rhonchi. Gastrointestinal: Soft and non tender. No rebound. No guarding.  Genitourinary: Deferred Musculoskeletal: Normal range of motion in all extremities. 1+ bilateral edema Neurologic:  Not oriented to events to why he is here or date. Moving all extremities.  Skin:  Multiple skin lesions to lower extremities, some surrounding erythema to shin of right leg  ____________________________________________    LABS (pertinent positives/negatives)  CBC wbc 5.9, hgb 11.0, plt 159 CMP na 142, k 2.7, glu 228, creatinine 0.99 ____________________________________________   EKG  I, Phineas Semen, attending physician, personally  viewed and interpreted this EKG  EKG Time: 1707 Rate: 113 Rhythm: atrial flutter Axis: right axis deviation Intervals: qtc 422 QRS: wide qrs ST changes: no st elevation Impression: abnormal ekg   ____________________________________________    RADIOLOGY  CT head New finding of hypodensity likely remote infarct  ____________________________________________   PROCEDURES  Procedures  ____________________________________________   INITIAL IMPRESSION / ASSESSMENT AND PLAN / ED COURSE  Pertinent labs & imaging results that were available during my care of the patient were reviewed by me and considered in my medical decision making (see chart for details).   Patient presented to the emergency department setting because of concern for altered mental status.  Differential would be broad.  Concern for infection, electrolyte abnormality, intracranial process.  Patient's work-up does show slight hypo-kalemia.  While he was here in the emergency department he did start complaining of leg cramping so do wonder if the cramping is secondary to that.  Patient's head CT does show a new finding of left hyper sided hypodensity.  I discussed with Dr. Ty Hilts directly who stated that it appears to be a remote infarct.  He at this point has a low suspicion for acute vasogenic edema.  Did see on [facility] charting patient had a left MCA over the summer Dr. Ty Hilts think this is likely the finding now seen. Will plan on admission for further work up and evalaution  ____________________________________________   FINAL CLINICAL IMPRESSION(S) / ED DIAGNOSES  Final diagnoses:  Altered mental status, unspecified altered mental status type  Cerebrovascular accident (CVA), unspecified mechanism (HCC)     Note: This dictation was prepared with Dragon dictation. Any transcriptional errors that result from this process are unintentional     Phineas Semen, MD 06/23/18 2055

## 2018-06-23 NOTE — ED Triage Notes (Signed)
Pt brought in via EMS from home. Family had Ems called in for stroke screen/AMS/combative. Hist dementia, DM2, BG 273, 97.9 oral, 66 HR, 167/83, 16 RR, 95% RA per EMS. Pt currently calm and denies pain.

## 2018-06-23 NOTE — ED Notes (Signed)
Spoke with DR Thora Lance on the phone concerning pt status. Pt is yelling and combative with staff. Pt is rolling from side to side on the stretcher and throwing his legs over the railings attempting to get up. Pt is confused and is not able to be redirected for safety. Pt will not leave his oxygen on and sats are dropping down to 87%. Pt has a sitter at the bedside for safety however pt is unable to be directed and is upset. MD states that pt is demented and that there is no medication that will help him. MD states pt will need a sitter and mattress on the floor. New order for IM zyprexa received.

## 2018-06-23 NOTE — ED Notes (Signed)
Pt back from CT

## 2018-06-23 NOTE — ED Notes (Signed)
Pt actively trying to craw out of bed. This is a repeated episode. Pt has been educated multiple times to stay in bed. Pt has removed BP cuff and is trying to attach it to his face instead of his arm.

## 2018-06-23 NOTE — ED Notes (Signed)
Pt has wounds bilat legs.

## 2018-06-23 NOTE — H&P (Signed)
[facility] [place] at [facility]   PATIENT NAME: Bobby Krueger    MR#:  [id number]  DATE OF BIRTH:  [birth year]  DATE OF ADMISSION:  06/23/2018  PRIMARY CARE PHYSICIAN: [facility]   REQUESTING/REFERRING PHYSICIAN:   CHIEF COMPLAINT:   Chief Complaint  Patient presents with  . Altered Mental Status    HISTORY OF PRESENT ILLNESS: Bobby Krueger  is a 84 y.o. male with a known history of CHF, COPD, diabetes type 2, hypertension, hyperlipidemia, atrial fibrillation, stroke, dementia and other comorbidities. Patient is currently unable to provide history due to his altered mental status.  Information was taken from reviewing the medical records and from discussion with emergency room physician. Patient was brought to emergency room for worsening mental status and agitation.  It is unclear what his baseline is.  No reports of fever, chills, cough, vomiting, diarrhea, bleeding. While in the emergency room, patient is noted to be confused, agitated, short of breath and wheezing. Blood test done emergency room, including CBC and CMP are grossly unremarkable except for low potassium at 2.7.  UA is positive for UTI. EKG shows atrial flutter with heart rate at 113 bpm, no acute ischemic changes. CT of the brain is negative for acute changes but shows significant chronic brain damage from prior large stroke. Chest x-ray shows bilateral diffuse interstitial edema. She is admitted for further evaluation and treatment.  PAST MEDICAL HISTORY:   Past Medical History:  Diagnosis Date  . A-fib (HCC)   . 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 03/2014: EF 35%, normal nuc in 05/2014 b. echo 11/2015: EF 40-45% w/ diffuse HK and Grade 1 DD  . COPD (chronic obstructive pulmonary disease) (HCC)   . Diabetes mellitus without complication (HCC)   . High cholesterol   . Hypertension     PAST SURGICAL HISTORY:   Past Surgical History:  Procedure Laterality Date  . BACK SURGERY    . CARPAL TUNNEL RELEASE    . CHOLECYSTECTOMY      SOCIAL HISTORY:  Social History   Tobacco Use  . Smoking status: Former Games developer  . Smokeless tobacco: Never Used  Substance Use Topics  . Alcohol use: No    FAMILY HISTORY:  Family History  Problem Relation Age of Onset  . Prostate cancer Unknown     DRUG ALLERGIES:  Allergies  Allergen Reactions  . Aldactone [Spironolactone] Hives  . Aspirin Hives  . Ciprofloxacin Hives  . Cyclobenzaprine Hives  . Lexapro [Escitalopram] Hives    REVIEW OF SYSTEMS:   Unable to obtain, due to patient's altered mental status.  MEDICATIONS AT HOME:  Prior to Admission medications   Medication Sig Start Date End Date Taking? Authorizing Provider  Fluticasone-Salmeterol (ADVAIR DISKUS) 250-50 MCG/DOSE AEPB Inhale 1 puff into the lungs daily as needed (for wheezing or shortness).    Yes [provider]  ipratropium-albuterol (DUONEB) 0.5-2.5 (3) MG/3ML SOLN Take 3 mLs by nebulization every 6 (six) hours as needed (for SOB).   Yes [provider]  polyvinyl alcohol (LIQUIFILM TEARS) 1.4 % ophthalmic solution Place 1 drop into both eyes as needed for dry eyes. Reported on 12/21/2015   Yes [provider]  triamcinolone cream (KENALOG) 0.1 % Apply 1 application topically 2 (two) times daily as needed (for irritation).   Yes [provider]  albuterol (PROAIR HFA) 108 (90 Base) MCG/ACT inhaler Inhale 2 puffs into the lungs every 4 (  four) hours as needed for wheezing or shortness of breath.     [provider]  apixaban (ELIQUIS) 5 MG TABS tablet Take 1 tablet (5 mg total) by mouth 2 (two) times daily. 11/21/15   [name], MD  atorvastatin (LIPITOR) 20 MG tablet Take 1 tablet (20 mg total) by mouth daily at 6 PM. 03/26/16   [name], MD  azithromycin (ZITHROMAX Z-PAK) 250 MG tablet 1 tablet (250 mg) once daily on Days 2 through  5. 03/25/18   Emily Filbert, MD  brimonidine (ALPHAGAN) 0.2 % ophthalmic solution Place 1 drop into both eyes daily.     [provider]  carvedilol (COREG) 6.25 MG tablet Take 6.25 mg by mouth 2 (two) times daily with a meal.     [provider]  furosemide (LASIX) 40 MG tablet Take 1 tablet (40 mg total) by mouth daily. 02/23/16   Ngetich, Dinah C, NP  gabapentin (NEURONTIN) 300 MG capsule Take 300 mg by mouth at bedtime.    [provider]  ibuprofen (ADVIL,MOTRIN) 400 MG tablet Take 400 mg by mouth every 6 (six) hours as needed for moderate pain.    [provider]  insulin glargine (LANTUS) 100 UNIT/ML injection Inject 0.08 mLs (8 Units total) into the skin at bedtime. Patient taking differently: Inject 10 Units into the skin at bedtime.  02/14/16   Hower, Cletis Athens, MD  insulin lispro (HUMALOG) 100 UNIT/ML injection Inject 0-9 Units into the skin 3 (three) times daily before meals. CBG's 70-120 = 0 units; 121-150 =1 units; 151-200 = 2 units; 201-250 =3 units; 251-300 =5 units; 301-350 =7 units; 351-400=9 units and > 400 Notify provider.    [provider]  latanoprost (XALATAN) 0.005 % ophthalmic solution Place 1 drop into both eyes at bedtime.    [provider]  losartan (COZAAR) 100 MG tablet Take 100 mg by mouth daily.    [provider]  nateglinide (STARLIX) 120 MG tablet Take 120 mg by mouth daily. 10/02/15   [provider]  Omega-3 1000 MG CAPS Take 1 g by mouth daily.    [provider]  omeprazole (PRILOSEC) 20 MG capsule Take 20 mg by mouth daily.    [provider]  potassium chloride SA (K-DUR,KLOR-CON) 20 MEQ tablet Take 20 mEq by mouth daily.    [provider]  SPIRIVA HANDIHALER 18 MCG inhalation capsule Place 1 application into inhaler and inhale daily. 10/13/15   [provider]  terazosin (HYTRIN) 5 MG capsule Take 5 mg by mouth every evening.    [provider]  trimethoprim-polymyxin b (POLYTRIM) ophthalmic solution Place 2 drops into the right eye every 6 (six) hours. Patient not taking: Reported on 06/09/2016 02/02/16   Cuthriell, Delorise Royals, PA-C      PHYSICAL EXAMINATION:   VITAL SIGNS: Blood pressure 134/66, pulse (!) 104, temperature 98.6 F (37 C), temperature source Oral, resp. rate (!) 28, height 5\' 6"  (1.676 m), weight 75.9 kg, SpO2 97 %.  GENERAL:  84 y.o.-year-old patient lying in the bed, confused, agitated.  EYES: Pupils equal, round, reactive to light and accommodation. No scleral icterus. Extraocular muscles intact.  HEENT: Head atraumatic, normocephalic. Oropharynx and nasopharynx clear.  NECK:  Supple, no jugular venous distention. No thyroid enlargement, no tenderness.  LUNGS: Tachypnea present.  Reduced breath sounds and wheezing noted bilaterally.  Mild use of accessory muscles of respiration noted.  CARDIOVASCULAR: Tachycardia.  Irregular heartbeat.  S1, S2  normal. No S3/S4.  ABDOMEN: Soft, nontender, nondistended. Bowel sounds present. No organomegaly or mass.  EXTREMITIES: No pedal edema, cyanosis, or clubbing.  NEUROLOGIC EXAM: Is limited due to patient's altered mental status.  He is moving all his extremities, no focal weakness appreciated. PSYCHIATRIC: The patient is confused, agitated.  SKIN: No obvious rash, lesion, or ulcer.   LABORATORY PANEL:   CBC Recent Labs  Lab 06/23/18 1706  WBC 5.9  HGB 11.0*  HCT 35.6*  PLT 159  MCV 89.7  MCH 27.7  MCHC 30.9  RDW 14.2   ------------------------------------------------------------------------------------------------------------------  Chemistries  Recent Labs  Lab 06/23/18 1706 06/23/18 1752  NA 142  --   K 2.7*  --   CL 99  --   CO2 35*  --   GLUCOSE 228*  --   BUN 14  --   CREATININE 0.99  --   CALCIUM 8.9  --   MG  --  1.7  AST 23  --   ALT 18  --   ALKPHOS 100  --   BILITOT 0.5  --     ------------------------------------------------------------------------------------------------------------------ estimated creatinine clearance is 45.6 mL/min (by C-G formula based on SCr of 0.99 mg/dL). ------------------------------------------------------------------------------------------------------------------ No results for input(s): TSH, T4TOTAL, T3FREE, THYROIDAB in the last 72 hours.  Invalid input(s): FREET3   Coagulation profile No results for input(s): INR, PROTIME in the last 168 hours. ------------------------------------------------------------------------------------------------------------------- No results for input(s): DDIMER in the last 72 hours. -------------------------------------------------------------------------------------------------------------------  Cardiac Enzymes Recent Labs  Lab 06/23/18 2023  TROPONINI 0.05*   ------------------------------------------------------------------------------------------------------------------ Invalid input(s): POCBNP  ---------------------------------------------------------------------------------------------------------------  Urinalysis    Component Value Date/Time   COLORURINE YELLOW (A) 06/23/2018 1833   APPEARANCEUR CLEAR (A) 06/23/2018 1833   APPEARANCEUR Clear 06/05/2013 1502   LABSPEC 1.017 06/23/2018 1833   LABSPEC 1.012 06/05/2013 1502   PHURINE 6.0 06/23/2018 1833   GLUCOSEU 150 (A) 06/23/2018 1833   GLUCOSEU 50 mg/dL 16/04/9603 5409   HGBUR MODERATE (A) 06/23/2018 1833   BILIRUBINUR NEGATIVE 06/23/2018 1833   BILIRUBINUR Negative 06/05/2013 1502   KETONESUR NEGATIVE 06/23/2018 1833   PROTEINUR 100 (A) 06/23/2018 1833   NITRITE NEGATIVE 06/23/2018 1833   LEUKOCYTESUR NEGATIVE 06/23/2018 1833   LEUKOCYTESUR Negative 06/05/2013 1502     RADIOLOGY: Ct Head Wo Contrast  Result Date: 06/23/2018 CLINICAL DATA:  Dementia, stroke symptoms EXAM: CT HEAD WITHOUT CONTRAST TECHNIQUE:  Contiguous axial images were obtained from the base of the skull through the vertex without intravenous contrast. COMPARISON:  Brain MRI 03/23/2016 FINDINGS: Brain: New large field of subcortical hypodensity / edema in the high LEFT frontal lobe which extends into the lateral temporal lobe. This hypodensity extends in the cortex of the temporal lobe. There is marked decreased density centrally within this lesion suggesting remote infarction. Extensive periventricular white matter hypodensities.  Unchanged No acute intracranial hemorrhage.  Midline shift or mass effect. Vascular: No hyperdense vessel or unexpected calcification. Skull: Normal. Negative for fracture or focal lesion. Sinuses/Orbits: Paranasal sinuses and mastoid air cells are clear. Orbits are clear. Other: None. IMPRESSION: 1. Large region hypodensity in the LEFT frontal lobe extending into the lateral LEFT temporal lobe. While this pattern suggest vasogenic edema, favor findings to represent a large deep REMOTE white matter infarction with some cortical extension rather than a mass lesion. 2. Extensive periventricular white matter disease similar to comparison MRI. Electronically Signed   By: Genevive Bi M.D.   On: 06/23/2018 20:00   Dg Chest Port 1 View  Result Date: 06/23/2018 CLINICAL  DATA:  Dyspnea and wheeze EXAM: PORTABLE CHEST 1 VIEW COMPARISON:  03/25/2018 FINDINGS: Stable cardiomegaly. Low lung volumes with diffuse interstitial prominence. Mild aortic atherosclerosis at the arch. No aneurysm. ACDF of the included lower cervical spine. Osteoarthritis of the glenohumeral joints left worse than right with remodeled appearance of the glenoid fossa and inferomedial spurring of the humeral head noted. IMPRESSION: Low lung volumes with mild bilateral diffuse interstitial prominence, nonspecific but may reflect chronic interstitial lung disease, small airway inflammation and/or interstitial edema some considerations. No acute bacterial  pneumonia. Electronically Signed   By: Tollie Eth M.D.   On: 06/23/2018 22:56    EKG: Orders placed or performed during the hospital encounter of 06/23/18  . EKG 12-Lead  . EKG 12-Lead    IMPRESSION AND PLAN:  1.  Acute on chronic encephalopathy, possibly related to acute UTI.  We will continue to monitor clinically closely while treating the infection with antibiotics. 2.  Acute UTI, will start IV Rocephin, while waiting for urine culture result. 3.  Acute respiratory distress, secondary to acute CHF exacerbation.  We will start treatment with Lasix IV and oxygen as needed, to maintain oxygen saturation around 92%. 4.  Acute CHF exacerbation, see treatment as above under #3. 5.  COPD, without acute exacerbation, continue maintenance therapy. 6.  Hypokalemia, likely secondary to diuretics.  We will continue to replace potassium per protocol. 7.  Diabetes type 2.  Will monitor blood sugars before meals and at bedtime and use insulin treatment during the hospital stay. 8.  Paroxysmal atrial fibrillation, currently in atrial flutter with heart rate around 110.  Continue treatment with Coreg and Eliquis. 9.  Hypertension, stable, resume home medications. 10.  Hyperlipidemia, on statin.  All the records are reviewed and case discussed with ED provider.   CODE STATUS: Full    Code Status Orders  (From admission, onward)         Start     Ordered   06/24/18 0023  Full code  Continuous     06/24/18 0022        Code Status History    Date Active Date Inactive Code Status Order ID Comments User Context   06/10/2016 0058 06/10/2016 1611 Full Code [id number]  [name], DO Inpatient   03/22/2016 2133 03/27/2016 0039 Full Code [id number]  Lorretta Harp, MD ED   02/10/2016 0603 02/14/2016 1729 Full Code [id number]  Ihor Austin, MD Inpatient   11/16/2015 0520 11/21/2015 1937 Full Code [id number]  Clydie Braun, MD ED    Advance Directive Documentation     Most Recent Value  Type of  Advance Directive  Healthcare Power of Attorney  Pre-existing out of facility DNR order (yellow form or pink MOST form)  -  "MOST" Form in Place?  -       TOTAL TIME TAKING CARE OF THIS PATIENT: 50 minutes.    Cammy Copa M.D on 06/24/2018 at 3:54 AM  Between 7am to 6pm - Pager - [phone]  After 6pm go to [email or web address] - password [email or web address] [facility] [place] at [facility]  [phone]  CC: Primary care physician; [facility]

## 2018-06-23 NOTE — ED Notes (Signed)
Attempted report to floor. Asked to hold as they're requesting for a sitter now.

## 2018-06-23 NOTE — ED Notes (Signed)
Safety sitter at bedside 

## 2018-06-23 NOTE — ED Notes (Signed)
Pt states he has mid/upper abd pain after urination.

## 2018-06-24 ENCOUNTER — Inpatient Hospital Stay: Payer: Medicare Other

## 2018-06-24 DIAGNOSIS — R4182 Altered mental status, unspecified: Secondary | ICD-10-CM

## 2018-06-24 LAB — BASIC METABOLIC PANEL
Anion gap: 9 (ref 5–15)
BUN: 15 mg/dL (ref 8–23)
CO2: 35 mmol/L — ABNORMAL HIGH (ref 22–32)
CREATININE: 1.05 mg/dL (ref 0.61–1.24)
Calcium: 8.7 mg/dL — ABNORMAL LOW (ref 8.9–10.3)
Chloride: 101 mmol/L (ref 98–111)
GFR calc Af Amer: >60 mL/min (ref >60)
GFR calc non Af Amer: >60 mL/min (ref >60)
GLUCOSE: 186 mg/dL — AB (ref 70–99)
Potassium: 2.5 mmol/L — CL (ref 3.5–5.1)
Sodium: 145 mmol/L (ref 135–145)

## 2018-06-24 LAB — MAGNESIUM
Magnesium: 1.7 mg/dL (ref 1.7–2.4)
Magnesium: 2.2 mg/dL (ref 1.7–2.4)

## 2018-06-24 LAB — CBC
HCT: 33.4 % — ABNORMAL LOW (ref 39.0–52.0)
Hemoglobin: 9.9 g/dL — ABNORMAL LOW (ref 13.0–17.0)
MCH: 26.8 pg (ref 26.0–34.0)
MCHC: 29.6 g/dL — ABNORMAL LOW (ref 30.0–36.0)
MCV: 90.5 fL (ref 80.0–100.0)
Platelets: 150 10*3/uL (ref 150–400)
RBC: 3.69 MIL/uL — ABNORMAL LOW (ref 4.22–5.81)
RDW: 14.5 % (ref 11.5–15.5)
WBC: 6.7 10*3/uL (ref 4.0–10.5)
nRBC: 0 % (ref 0.0–0.2)

## 2018-06-24 LAB — BRAIN NATRIURETIC PEPTIDE: B Natriuretic Peptide: 2260 pg/mL — ABNORMAL HIGH (ref 0.0–100.0)

## 2018-06-24 LAB — GLUCOSE, CAPILLARY
GLUCOSE-CAPILLARY: 171 mg/dL — AB (ref 70–99)
Glucose-Capillary: 128 mg/dL — ABNORMAL HIGH (ref 70–99)
Glucose-Capillary: 170 mg/dL — ABNORMAL HIGH (ref 70–99)
Glucose-Capillary: 179 mg/dL — ABNORMAL HIGH (ref 70–99)
Glucose-Capillary: 228 mg/dL — ABNORMAL HIGH (ref 70–99)

## 2018-06-24 LAB — POTASSIUM: Potassium: 3.4 mmol/L — ABNORMAL LOW (ref 3.5–5.1)

## 2018-06-24 MED ORDER — ONDANSETRON HCL 4 MG PO TABS: 4.0000 mg | ORAL_TABLET | Freq: Four times a day (QID) | ORAL | Status: DC | PRN

## 2018-06-24 MED ORDER — DOCUSATE SODIUM 100 MG PO CAPS
100.0000 mg | ORAL_CAPSULE | Freq: Two times a day (BID) | ORAL | Status: DC
  Administered 2018-06-24 – 2018-06-29 (×7): 100 mg via ORAL
  Filled 2018-06-24 (×9): qty 1

## 2018-06-24 MED ORDER — APIXABAN 5 MG PO TABS
5.0000 mg | ORAL_TABLET | Freq: Two times a day (BID) | ORAL | Status: DC
  Administered 2018-06-24 – 2018-06-29 (×10): 5 mg via ORAL
  Filled 2018-06-24 (×10): qty 1

## 2018-06-24 MED ORDER — HALOPERIDOL LACTATE 5 MG/ML IJ SOLN
0.5000 mg | Freq: Four times a day (QID) | INTRAMUSCULAR | Status: DC | PRN
  Administered 2018-06-25 – 2018-06-28 (×7): 0.5 mg via INTRAVENOUS
  Filled 2018-06-24 (×7): qty 1

## 2018-06-24 MED ORDER — POTASSIUM CHLORIDE CRYS ER 20 MEQ PO TBCR
40.0000 meq | EXTENDED_RELEASE_TABLET | Freq: Three times a day (TID) | ORAL | Status: AC
  Administered 2018-06-24 (×3): 40 meq via ORAL
  Filled 2018-06-24 (×3): qty 2

## 2018-06-24 MED ORDER — SODIUM CHLORIDE 0.9 % IV SOLN
500.0000 mg | INTRAVENOUS | Status: DC
  Administered 2018-06-24 – 2018-06-26 (×3): 500 mg via INTRAVENOUS
  Filled 2018-06-24 (×3): qty 500

## 2018-06-24 MED ORDER — TERAZOSIN HCL 5 MG PO CAPS
5.0000 mg | ORAL_CAPSULE | Freq: Every evening | ORAL | Status: DC
  Administered 2018-06-24 – 2018-06-28 (×5): 5 mg via ORAL
  Filled 2018-06-24 (×6): qty 1

## 2018-06-24 MED ORDER — IPRATROPIUM-ALBUTEROL 0.5-2.5 (3) MG/3ML IN SOLN
3.0000 mL | Freq: Four times a day (QID) | RESPIRATORY_TRACT | Status: DC | PRN
  Administered 2018-06-26: 3 mL via RESPIRATORY_TRACT
  Filled 2018-06-24: qty 3

## 2018-06-24 MED ORDER — ACETAMINOPHEN 650 MG RE SUPP: 650.0000 mg | Freq: Four times a day (QID) | RECTAL | Status: DC | PRN

## 2018-06-24 MED ORDER — ALBUTEROL SULFATE (2.5 MG/3ML) 0.083% IN NEBU: 2.5000 mg | INHALATION_SOLUTION | RESPIRATORY_TRACT | Status: DC | PRN

## 2018-06-24 MED ORDER — TRAZODONE HCL 50 MG PO TABS
25.0000 mg | ORAL_TABLET | Freq: Every evening | ORAL | Status: DC | PRN
  Administered 2018-06-24 – 2018-06-29 (×5): 25 mg via ORAL
  Filled 2018-06-24 (×5): qty 1

## 2018-06-24 MED ORDER — LOSARTAN POTASSIUM 50 MG PO TABS
100.0000 mg | ORAL_TABLET | Freq: Every day | ORAL | Status: DC
  Administered 2018-06-24 – 2018-06-29 (×5): 100 mg via ORAL
  Filled 2018-06-24 (×5): qty 2

## 2018-06-24 MED ORDER — LATANOPROST 0.005 % OP SOLN
1.0000 [drp] | Freq: Every day | OPHTHALMIC | Status: DC
  Administered 2018-06-24 – 2018-06-25 (×2): 1 [drp] via OPHTHALMIC
  Filled 2018-06-24 (×2): qty 2.5

## 2018-06-24 MED ORDER — FUROSEMIDE 40 MG PO TABS: 40.0000 mg | ORAL_TABLET | Freq: Every day | ORAL | Status: DC

## 2018-06-24 MED ORDER — PANTOPRAZOLE SODIUM 40 MG PO TBEC
40.0000 mg | DELAYED_RELEASE_TABLET | Freq: Every day | ORAL | Status: DC
  Administered 2018-06-24 – 2018-06-29 (×5): 40 mg via ORAL
  Filled 2018-06-24 (×5): qty 1

## 2018-06-24 MED ORDER — ATORVASTATIN CALCIUM 20 MG PO TABS
20.0000 mg | ORAL_TABLET | Freq: Every day | ORAL | Status: DC
  Administered 2018-06-24 – 2018-06-28 (×5): 20 mg via ORAL
  Filled 2018-06-24 (×4): qty 1

## 2018-06-24 MED ORDER — POTASSIUM CHLORIDE 10 MEQ/100ML IV SOLN
10.0000 meq | INTRAVENOUS | Status: AC
  Administered 2018-06-24 (×4): 10 meq via INTRAVENOUS
  Filled 2018-06-24 (×5): qty 100

## 2018-06-24 MED ORDER — INSULIN ASPART 100 UNIT/ML ~~LOC~~ SOLN
0.0000 [IU] | Freq: Three times a day (TID) | SUBCUTANEOUS | Status: DC
  Administered 2018-06-24 (×2): 2 [IU] via SUBCUTANEOUS
  Administered 2018-06-24 – 2018-06-25 (×2): 1 [IU] via SUBCUTANEOUS
  Administered 2018-06-25: 2 [IU] via SUBCUTANEOUS
  Administered 2018-06-26: 3 [IU] via SUBCUTANEOUS
  Administered 2018-06-26 – 2018-06-27 (×2): 1 [IU] via SUBCUTANEOUS
  Administered 2018-06-27: 2 [IU] via SUBCUTANEOUS
  Administered 2018-06-27 – 2018-06-29 (×3): 1 [IU] via SUBCUTANEOUS
  Filled 2018-06-24 (×12): qty 1

## 2018-06-24 MED ORDER — MAGNESIUM SULFATE 2 GM/50ML IV SOLN
2.0000 g | Freq: Once | INTRAVENOUS | Status: AC
  Administered 2018-06-24: 2 g via INTRAVENOUS
  Filled 2018-06-24: qty 50

## 2018-06-24 MED ORDER — BRIMONIDINE TARTRATE 0.2 % OP SOLN
1.0000 [drp] | Freq: Every day | OPHTHALMIC | Status: DC
  Administered 2018-06-26 – 2018-06-27 (×2): 1 [drp] via OPHTHALMIC
  Filled 2018-06-24 (×2): qty 5

## 2018-06-24 MED ORDER — INSULIN ASPART 100 UNIT/ML ~~LOC~~ SOLN
0.0000 [IU] | Freq: Every day | SUBCUTANEOUS | Status: DC
  Administered 2018-06-24: 2 [IU] via SUBCUTANEOUS
  Administered 2018-06-25: 4 [IU] via SUBCUTANEOUS
  Filled 2018-06-24 (×2): qty 1

## 2018-06-24 MED ORDER — CARVEDILOL 6.25 MG PO TABS
6.2500 mg | ORAL_TABLET | Freq: Two times a day (BID) | ORAL | Status: DC
  Administered 2018-06-24 – 2018-06-29 (×9): 6.25 mg via ORAL
  Filled 2018-06-24 (×10): qty 1

## 2018-06-24 MED ORDER — POLYVINYL ALCOHOL 1.4 % OP SOLN
1.0000 [drp] | OPHTHALMIC | Status: DC | PRN
  Filled 2018-06-24: qty 15

## 2018-06-24 MED ORDER — HYDROCODONE-ACETAMINOPHEN 5-325 MG PO TABS
1.0000 | ORAL_TABLET | ORAL | Status: DC | PRN
  Administered 2018-06-25: 2 via ORAL
  Filled 2018-06-24: qty 2

## 2018-06-24 MED ORDER — ONDANSETRON HCL 4 MG/2ML IJ SOLN: 4.0000 mg | Freq: Four times a day (QID) | INTRAMUSCULAR | Status: DC | PRN

## 2018-06-24 MED ORDER — FUROSEMIDE 10 MG/ML IJ SOLN
40.0000 mg | Freq: Two times a day (BID) | INTRAMUSCULAR | Status: DC
  Administered 2018-06-24: 40 mg via INTRAVENOUS
  Filled 2018-06-24: qty 4

## 2018-06-24 MED ORDER — POTASSIUM CHLORIDE CRYS ER 20 MEQ PO TBCR: 40.0000 meq | EXTENDED_RELEASE_TABLET | Freq: Two times a day (BID) | ORAL | Status: DC

## 2018-06-24 MED ORDER — BISACODYL 5 MG PO TBEC: 5.0000 mg | DELAYED_RELEASE_TABLET | Freq: Every day | ORAL | Status: DC | PRN

## 2018-06-24 MED ORDER — FUROSEMIDE 10 MG/ML IJ SOLN
20.0000 mg | Freq: Once | INTRAMUSCULAR | Status: AC
  Administered 2018-06-24: 20 mg via INTRAVENOUS
  Filled 2018-06-24: qty 2

## 2018-06-24 MED ORDER — TIOTROPIUM BROMIDE MONOHYDRATE 18 MCG IN CAPS
18.0000 ug | ORAL_CAPSULE | Freq: Every day | RESPIRATORY_TRACT | Status: DC
  Administered 2018-06-26 – 2018-06-27 (×2): 18 ug via RESPIRATORY_TRACT
  Filled 2018-06-24 (×2): qty 5

## 2018-06-24 MED ORDER — SODIUM CHLORIDE 0.9 % IV SOLN
1.0000 g | INTRAVENOUS | Status: DC
  Administered 2018-06-24 – 2018-06-26 (×3): 1 g via INTRAVENOUS
  Filled 2018-06-24: qty 10
  Filled 2018-06-24: qty 1
  Filled 2018-06-24: qty 10
  Filled 2018-06-24: qty 1

## 2018-06-24 MED ORDER — ACETAMINOPHEN 325 MG PO TABS: 650.0000 mg | ORAL_TABLET | Freq: Four times a day (QID) | ORAL | Status: DC | PRN

## 2018-06-24 MED ORDER — GABAPENTIN 300 MG PO CAPS
300.0000 mg | ORAL_CAPSULE | Freq: Every day | ORAL | Status: DC
  Administered 2018-06-24 – 2018-06-28 (×5): 300 mg via ORAL
  Filled 2018-06-24 (×5): qty 1

## 2018-06-24 MED ORDER — POTASSIUM CHLORIDE CRYS ER 20 MEQ PO TBCR
20.0000 meq | EXTENDED_RELEASE_TABLET | Freq: Every day | ORAL | Status: DC
  Administered 2018-06-24: 20 meq via ORAL
  Filled 2018-06-24: qty 1

## 2018-06-24 NOTE — Consult Note (Signed)
NEUROLOGY CONSULT  Reason for Consult:Confusion, abnormal CT head  Referring Physician: Dr. Juliene Pina  CC: Confusion   HPI: Bobby Krueger is an 84 y.o. male with PMH significant for CHF, Recurrent UTIs, dementia, COPD and DM who presnted from the nursing home due to increased confusion and agitation, pt denied any fall, no SOB, no CP, no N/V. In the ED UA showed possible UTI, pt was started on abx, he was wheezing and had increased work of breathing, he was treated for CHF exacerbation. CT head showed chronic changes but potentially new L parietal old/subacute hypodensity, neurology consult was called to evaluate his confusion and the CT findings.   Past Medical History Past Medical History:  Diagnosis Date  . A-fib (HCC)   . 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 03/2014: EF 35%, normal nuc in 05/2014 b. echo 11/2015: EF 40-45% w/ diffuse HK and Grade 1 DD  . COPD (chronic obstructive pulmonary disease) (HCC)   . Diabetes mellitus without complication (HCC)   . High cholesterol   . Hypertension     Past Surgical History Past Surgical History:  Procedure Laterality Date  . BACK SURGERY    . CARPAL TUNNEL RELEASE    . CHOLECYSTECTOMY      Family History Family History  Problem Relation Age of Onset  . Prostate cancer Unknown     Social History    reports that he has quit smoking. He has never used smokeless tobacco. He reports that he does not drink alcohol or use drugs.  Allergies Allergies  Allergen Reactions  . Aldactone [Spironolactone] Hives  . Aspirin Hives  . Ciprofloxacin Hives  . Cyclobenzaprine Hives  . Lexapro [Escitalopram] Hives    Home Medications Medications Prior to Admission  Medication Sig Dispense Refill  . Fluticasone-Salmeterol (ADVAIR DISKUS) 250-50 MCG/DOSE AEPB Inhale 1 puff into the lungs daily as needed (for wheezing or shortness).     Marland Kitchen ipratropium-albuterol (DUONEB) 0.5-2.5 (3) MG/3ML  SOLN Take 3 mLs by nebulization every 6 (six) hours as needed (for SOB).    . polyvinyl alcohol (LIQUIFILM TEARS) 1.4 % ophthalmic solution Place 1 drop into both eyes as needed for dry eyes. Reported on 12/21/2015    . triamcinolone cream (KENALOG) 0.1 % Apply 1 application topically 2 (two) times daily as needed (for irritation).    Marland Kitchen albuterol (PROAIR HFA) 108 (90 Base) MCG/ACT inhaler Inhale 2 puffs into the lungs every 4 (four) hours as needed for wheezing or shortness of breath.     Marland Kitchen apixaban (ELIQUIS) 5 MG TABS tablet Take 1 tablet (5 mg total) by mouth 2 (two) times daily. 60 tablet 0  . atorvastatin (LIPITOR) 20 MG tablet Take 1 tablet (20 mg total) by mouth daily at 6 PM.    . azithromycin (ZITHROMAX Z-PAK) 250 MG tablet 1 tablet (250 mg) once daily on Days 2 through 5. 4 each 0  . brimonidine (ALPHAGAN) 0.2 % ophthalmic solution Place 1 drop into both eyes daily.     . carvedilol (COREG) 6.25 MG tablet Take 6.25 mg by mouth 2 (two) times daily with a meal.     . furosemide (LASIX) 40 MG tablet Take 1 tablet (40 mg total) by mouth daily. 30 tablet 0  . gabapentin (NEURONTIN) 300 MG capsule Take 300 mg by mouth at bedtime.    Marland Kitchen ibuprofen (ADVIL,MOTRIN) 400 MG tablet Take 400 mg by mouth every 6 (six) hours as needed for moderate pain.    Marland Kitchen  insulin glargine (LANTUS) 100 UNIT/ML injection Inject 0.08 mLs (8 Units total) into the skin at bedtime. (Patient taking differently: Inject 10 Units into the skin at bedtime. ) 10 mL 0  . insulin lispro (HUMALOG) 100 UNIT/ML injection Inject 0-9 Units into the skin 3 (three) times daily before meals. CBG's 70-120 = 0 units; 121-150 =1 units; 151-200 = 2 units; 201-250 =3 units; 251-300 =5 units; 301-350 =7 units; 351-400=9 units and > 400 Notify provider.    . latanoprost (XALATAN) 0.005 % ophthalmic solution Place 1 drop into both eyes at bedtime.    Marland Kitchen losartan (COZAAR) 100 MG tablet Take 100 mg by mouth daily.    . nateglinide (STARLIX) 120 MG tablet  Take 120 mg by mouth daily.    . Omega-3 1000 MG CAPS Take 1 g by mouth daily.    Marland Kitchen omeprazole (PRILOSEC) 20 MG capsule Take 20 mg by mouth daily.    . potassium chloride SA (K-DUR,KLOR-CON) 20 MEQ tablet Take 20 mEq by mouth daily.    Marland Kitchen SPIRIVA HANDIHALER 18 MCG inhalation capsule Place 1 application into inhaler and inhale daily.    Marland Kitchen terazosin (HYTRIN) 5 MG capsule Take 5 mg by mouth every evening.    . trimethoprim-polymyxin b (POLYTRIM) ophthalmic solution Place 2 drops into the right eye every 6 (six) hours. (Patient not taking: Reported on 06/09/2016) 10 mL 0    Hospital Medications . apixaban  5 mg Oral BID  . atorvastatin  20 mg Oral q1800  . brimonidine  1 drop Both Eyes Daily  . carvedilol  6.25 mg Oral BID WC  . docusate sodium  100 mg Oral BID  . furosemide  20 mg Intravenous Once  . gabapentin  300 mg Oral QHS  . insulin aspart  0-5 Units Subcutaneous QHS  . insulin aspart  0-9 Units Subcutaneous TID WC  . ipratropium-albuterol  3 mL Nebulization Q6H  . latanoprost  1 drop Both Eyes QHS  . losartan  100 mg Oral Daily  . pantoprazole  40 mg Oral Daily  . potassium chloride SA  20 mEq Oral Daily  . terazosin  5 mg Oral QPM  . tiotropium  18 mcg Inhalation Daily     ROS: Negative except of what is reported in HPI  s   Physical Examination:  Vitals:   06/23/18 2318 06/24/18 0021 06/24/18 0131 06/24/18 0418  BP:  134/66  133/69  Pulse:  (!) 104  66  Resp:    20  Temp:  98.6 F (37 C)  98.2 F (36.8 C)  TempSrc:  Oral  Oral  SpO2: (!) 87% 95% 97% 98%  Weight:  75.9 kg  75.6 kg  Height:        General a pleasant elderly man who is awake in NAD Heart - Regular rate and rhythm - no murmer Lungs - Clear to auscultation Abdomen - Soft - non tender Extremities - Distal pulses intact - no edema Skin - Warm and dry  Neurologic Examination:   Mental Status:  Alert, oriented only to name Speech without evidence of dysarthria or aphasia. Able to follow   commands without difficulty.  Face symmetric PERRLA, EOMI, visual fields intact   Motor 4/5 all groups Sensory: Intact to light touch in all extremities.    LABORATORY STUDIES:  Basic Metabolic Panel: Recent Labs  Lab 06/23/18 1706 06/23/18 1752 06/24/18 0442  NA 142  --  145  K 2.7*  --  2.5*  CL 99  --  101  CO2 35*  --  35*  GLUCOSE 228*  --  186*  BUN 14  --  15  CREATININE 0.99  --  1.05  CALCIUM 8.9  --  8.7*  MG  --  1.7  --     Liver Function Tests: Recent Labs  Lab 06/23/18 1706  AST 23  ALT 18  ALKPHOS 100  BILITOT 0.5  PROT 7.0  ALBUMIN 3.2*   No results for input(s): LIPASE, AMYLASE in the last 168 hours. No results for input(s): AMMONIA in the last 168 hours.  CBC: Recent Labs  Lab 06/23/18 1706 06/24/18 0442  WBC 5.9 6.7  HGB 11.0* 9.9*  HCT 35.6* 33.4*  MCV 89.7 90.5  PLT 159 150    Cardiac Enzymes: Recent Labs  Lab 06/23/18 2023  TROPONINI 0.05*    BNP: Invalid input(s): POCBNP  CBG: Recent Labs  Lab 06/23/18 1717 06/24/18 0037 06/24/18 0814  GLUCAP 186* 228* 128*    Microbiology:   Coagulation Studies: No results for input(s): LABPROT, INR in the last 72 hours.  Urinalysis:  Recent Labs  Lab 06/23/18 1833  COLORURINE YELLOW*  LABSPEC 1.017  PHURINE 6.0  GLUCOSEU 150*  HGBUR MODERATE*  BILIRUBINUR NEGATIVE  KETONESUR NEGATIVE  PROTEINUR 100*  NITRITE NEGATIVE  LEUKOCYTESUR NEGATIVE    Lipid Panel:     Component Value Date/Time   CHOL 111 03/23/2016 0504   CHOL 89 06/06/2013 0637   TRIG 108 03/23/2016 0504   TRIG 95 06/06/2013 0637   HDL 37 (L) 03/23/2016 0504   HDL 37 (L) 06/06/2013 0637   CHOLHDL 3.0 03/23/2016 0504   VLDL 22 03/23/2016 0504   VLDL 19 06/06/2013 0637   LDLCALC 52 03/23/2016 0504   LDLCALC 33 06/06/2013 0637    HgbA1C:  Lab Results  Component Value Date   HGBA1C 5.5 03/23/2016    Urine Drug Screen:      Component Value Date/Time   LABOPIA NONE DETECTED 03/22/2016 1825    COCAINSCRNUR NONE DETECTED 03/22/2016 1825   COCAINSCRNUR NONE DETECTED 02/09/2016 2304   LABBENZ NONE DETECTED 03/22/2016 1825   AMPHETMU NONE DETECTED 03/22/2016 1825   THCU NONE DETECTED 03/22/2016 1825   LABBARB NONE DETECTED 03/22/2016 1825     Alcohol Level:  No results for input(s): ETH in the last 168 hours.  Miscellaneous labs:  EKG  EKG  IMAGING: Ct Head Wo Contrast  Result Date: 06/23/2018 CLINICAL DATA:  Dementia, stroke symptoms EXAM: CT HEAD WITHOUT CONTRAST TECHNIQUE: Contiguous axial images were obtained from the base of the skull through the vertex without intravenous contrast. COMPARISON:  Brain MRI 03/23/2016 FINDINGS: Brain: New large field of subcortical hypodensity / edema in the high LEFT frontal lobe which extends into the lateral temporal lobe. This hypodensity extends in the cortex of the temporal lobe. There is marked decreased density centrally within this lesion suggesting remote infarction. Extensive periventricular white matter hypodensities.  Unchanged No acute intracranial hemorrhage.  Midline shift or mass effect. Vascular: No hyperdense vessel or unexpected calcification. Skull: Normal. Negative for fracture or focal lesion. Sinuses/Orbits: Paranasal sinuses and mastoid air cells are clear. Orbits are clear. Other: None. IMPRESSION: 1. Large region hypodensity in the LEFT frontal lobe extending into the lateral LEFT temporal lobe. While this pattern suggest vasogenic edema, favor findings to represent a large deep REMOTE white matter infarction with some cortical extension rather than a mass lesion. 2. Extensive periventricular white matter disease similar to comparison MRI. Electronically Signed  By: Genevive Bi M.D.   On: 06/23/2018 20:00   Dg Chest Port 1 View  Result Date: 06/23/2018 CLINICAL DATA:  Dyspnea and wheeze EXAM: PORTABLE CHEST 1 VIEW COMPARISON:  03/25/2018 FINDINGS: Stable cardiomegaly. Low lung volumes with diffuse interstitial  prominence. Mild aortic atherosclerosis at the arch. No aneurysm. ACDF of the included lower cervical spine. Osteoarthritis of the glenohumeral joints left worse than right with remodeled appearance of the glenoid fossa and inferomedial spurring of the humeral head noted. IMPRESSION: Low lung volumes with mild bilateral diffuse interstitial prominence, nonspecific but may reflect chronic interstitial lung disease, small airway inflammation and/or interstitial edema some considerations. No acute bacterial pneumonia. Electronically Signed   By: Tollie Eth M.D.   On: 06/23/2018 22:56     Assessment/Plan:  84 YO M with PMH significant for CHF,COPD, Dementia P/W confusion, possible UTI, CHF exacerbation and abnormal CT head.  -Metabolic encephalopathy: please correct metabolic derangements -I reviewed CT head, I dont think the hypodensity is new, I will order MRI to make sure there is nothing acute/Subacute recommend ASA 81 mg for stroke prophylaxis give risk factors -PT/OT and speech evaluation   Jonna Munro, MD

## 2018-06-24 NOTE — Evaluation (Signed)
Clinical/Bedside Swallow Evaluation Patient Details  Name: Bobby Krueger MRN: [id number] Date of Birth: [birth year]  Today's Date: 06/24/2018 Time: SLP Start Time (ACUTE ONLY): 0915 SLP Stop Time (ACUTE ONLY): 1005 SLP Time Calculation (min) (ACUTE ONLY): 50 min  Past Medical History:  Past Medical History:  Diagnosis Date  . A-fib (HCC)   . 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 03/2014: EF 35%, normal nuc in 05/2014 b. echo 11/2015: EF 40-45% w/ diffuse HK and Grade 1 DD  . COPD (chronic obstructive pulmonary disease) (HCC)   . Diabetes mellitus without complication (HCC)   . High cholesterol   . Hypertension    Past Surgical History:  Past Surgical History:  Procedure Laterality Date  . BACK SURGERY    . CARPAL TUNNEL RELEASE    . CHOLECYSTECTOMY     HPI:  Pt is an 84 y.o. male with PMH significant for CHF, Recurrent UTIs, dementia, COPD and DM who presnted from the nursing home due to increased confusion and agitation, pt denied any fall, no SOB, no CP, no N/V.  UA showed possible UTI, pt was started on abx, he was wheezing and had increased work of breathing, he was treated for CHF exacerbation.  Pt is verbally conversive w/ some confusion noted in his engagment, orientation. Pt followed commands given cues.  No agitation noted.    Assessment / Plan / Recommendation Clinical Impression  Pt seen for BSE this AM. NSG reported pt agitated during admission; Sitter present currently. Pt's behavior was calm, mild confusion and needed verbal cues for follow through w/ tasks. Pt does have baseline Dementia in light of illness. Helped position pt more upright in bed for the BSE. Pt consumed po trials of thin liquids via Cup, purees w/ no overt s/s of aspiration noted; no decline in vocal quality or respiratory status noted during/post trials. A mild throat clearing was noted x1 post multiple sips of liquids but this did not occur again  nor increase in intensity. OM exam appeared grossly [facility] for bolus management, A-P transfer, and oral clearing b/t trials. Pt was not given solids d/t Edentulous status and confusion currently. OM exam appeared [facility] w/ no unilateral weakness noted. Pt educated on aspiration precautions and need to eat/drink slowly. Recommend a Puree diet w/ gravies added; thin liquids VIA CUP. General aspiration precautions; feeding assistance. Recommend Pills WHOLE or Crushed in puree for safer swallowing d/t overall status. ST services will f/u w/ toleration of diet and trials to uprade if appropriate. Precautions posted in room, chart. NSG updated. SLP Visit Diagnosis: Dysphagia, oral phase (R13.11)    Aspiration Risk  Mild aspiration risk(Cognitive decline)    Diet Recommendation  Dysphagia level 1 (PUREE) w/ gravies; thin liquids VIA CUP. General aspiration precautions and feeding assistance/monitoring at meals  Medication Administration: Whole meds with puree(Crushed if needed for safer swallowing)    Other  Recommendations Recommended Consults: (Dietician f/u) Oral Care Recommendations: Oral care BID;Staff/trained caregiver to provide oral care Other Recommendations: (n/a)   Follow up Recommendations (TBD)      Frequency and Duration min 2x/week  1 week       Prognosis Prognosis for Safe Diet Advancement: Fair Barriers to Reach Goals: Cognitive deficits      Swallow Study   General Date of Onset: 06/23/18 HPI: Pt is an 84 y.o. male with PMH significant for CHF, Recurrent UTIs, dementia, COPD and DM who presnted from the nursing home due to  increased confusion and agitation, pt denied any fall, no SOB, no CP, no N/V.  UA showed possible UTI, pt was started on abx, he was wheezing and had increased work of breathing, he was treated for CHF exacerbation.  Pt is verbally conversive w/ some confusion noted in his engagment, orientation. Pt followed commands given cues.  No agitation noted.  Type of  Study: Bedside Swallow Evaluation Previous Swallow Assessment: none noted Diet Prior to this Study: NPO Temperature Spikes Noted: No(wbc 6.7) Respiratory Status: Nasal cannula(2 liters) History of Recent Intubation: No Behavior/Cognition: Alert;Cooperative;Pleasant mood;Confused;Distractible;Requires cueing Oral Cavity Assessment: Dry(dried skin on lips) Oral Care Completed by SLP: Yes Oral Cavity - Dentition: Edentulous Vision: Functional for self-feeding Self-Feeding Abilities: Able to feed self;Needs assist;Needs set up(min impulsive) Patient Positioning: Upright in bed(needed positioning) Baseline Vocal Quality: Normal(somewhat muttered/mumbled speech) Volitional Cough: Cognitively unable to elicit Volitional Swallow: Able to elicit    Oral/Motor/Sensory Function Overall Oral Motor/Sensory Function: Within functional limits   Ice Chips Ice chips: Within functional limits Presentation: Spoon(fed; 5 trials)   Thin Liquid Thin Liquid: Within functional limits(grossly ) Presentation: Cup;Self Fed(No straw; ~4 ozs total) Other Comments: pt gave a throat clear x1 post multiple sips of thin liquids; seemed distracted easily as well    Nectar Thick Nectar Thick Liquid: Not tested   Honey Thick Honey Thick Liquid: Not tested   Puree Puree: Within functional limits Presentation: Spoon(fed; ~3 ozs)   Solid     Solid: Not tested Other Comments: edentulous; Cognitive decline       Jerilynn Som, MS, CCC-SLP Watson,Katherine 06/24/2018,10:15 AM

## 2018-06-24 NOTE — Plan of Care (Signed)
  Problem: Education: Goal: Knowledge of General Education information will improve Description Including pain rating scale, medication(s)/side effects and non-pharmacologic comfort measures Outcome: Not Progressing Note:  Patient demented with a history of a large CVA. Will continue to monitor neurological status. Jari Favre [facility]

## 2018-06-24 NOTE — Progress Notes (Signed)
[employer] - [place] at [facility]   PATIENT NAME: Bobby Krueger    MR#:  [id number]  DATE OF BIRTH:  14-Oct-1926  SUBJECTIVE:   patient presents to the emergency room due to confusion  REVIEW OF SYSTEMS:    Patient with dementia and come mild confusion   Tolerating Diet: yes      DRUG ALLERGIES:   Allergies  Allergen Reactions  . Aldactone [Spironolactone] Hives  . Aspirin Hives  . Ciprofloxacin Hives  . Cyclobenzaprine Hives  . Lexapro [Escitalopram] Hives    VITALS:  Blood pressure 130/81, pulse 65, temperature 98.2 F (36.8 C), temperature source Oral, resp. rate (!) 22, height 5\' 6"  (1.676 m), weight 75.6 kg, SpO2 99 %.  PHYSICAL EXAMINATION:  Constitutional: Appears well-developed and well-nourished. No distress. HENT: Normocephalic. Marland Kitchen Oropharynx is clear and moist.  Eyes: Conjunctivae and EOM are normal. PERRLA, no scleral icterus.  Neck: Normal ROM. Neck supple. No JVD. No tracheal deviation. CVS: RRR, S1/S2 +, no murmurs, no gallops, no carotid bruit.  Pulmonary: Normal respiratory effort with bilateral rhonchi and crackles at the bases  abdominal: Soft. BS +,  no distension, tenderness, rebound or guarding.  Musculoskeletal: Normal range of motion. No edema and no tenderness.  Neuro: Alert.  No focal deficits.  Oriented to name not place or time follows all commands Skin: Skin is warm and dry. No rash noted. Psychiatric: Confusion   LABORATORY PANEL:   CBC Recent Labs  Lab 06/24/18 0442  WBC 6.7  HGB 9.9*  HCT 33.4*  PLT 150   ------------------------------------------------------------------------------------------------------------------  Chemistries  Recent Labs  Lab 06/23/18 1706  06/24/18 0442 06/24/18 0917  NA 142  --  145  --   K 2.7*  --  2.5*  --   CL 99  --  101  --   CO2 35*  --  35*  --   GLUCOSE 228*  --  186*  --   BUN 14  --  15  --   CREATININE 0.99  --  1.05  --   CALCIUM 8.9  --  8.7*  --   MG  --     < >  --  1.7  AST 23  --   --   --   ALT 18  --   --   --   ALKPHOS 100  --   --   --   BILITOT 0.5  --   --   --    < > = values in this interval not displayed.   ------------------------------------------------------------------------------------------------------------------  Cardiac Enzymes Recent Labs  Lab 06/23/18 2023  TROPONINI 0.05*   ------------------------------------------------------------------------------------------------------------------  RADIOLOGY:  Ct Head Wo Contrast  Result Date: 06/23/2018 CLINICAL DATA:  Dementia, stroke symptoms EXAM: CT HEAD WITHOUT CONTRAST TECHNIQUE: Contiguous axial images were obtained from the base of the skull through the vertex without intravenous contrast. COMPARISON:  Brain MRI 03/23/2016 FINDINGS: Brain: New large field of subcortical hypodensity / edema in the high LEFT frontal lobe which extends into the lateral temporal lobe. This hypodensity extends in the cortex of the temporal lobe. There is marked decreased density centrally within this lesion suggesting remote infarction. Extensive periventricular white matter hypodensities.  Unchanged No acute intracranial hemorrhage.  Midline shift or mass effect. Vascular: No hyperdense vessel or unexpected calcification. Skull: Normal. Negative for fracture or focal lesion. Sinuses/Orbits: Paranasal sinuses and mastoid air cells are clear. Orbits are clear. Other: None. IMPRESSION: 1. Large region hypodensity in the LEFT  frontal lobe extending into the lateral LEFT temporal lobe. While this pattern suggest vasogenic edema, favor findings to represent a large deep REMOTE white matter infarction with some cortical extension rather than a mass lesion. 2. Extensive periventricular white matter disease similar to comparison MRI. Electronically Signed   By: Genevive Bi M.D.   On: 06/23/2018 20:00   Dg Chest Port 1 View  Result Date: 06/23/2018 CLINICAL DATA:  Dyspnea and wheeze EXAM: PORTABLE  CHEST 1 VIEW COMPARISON:  03/25/2018 FINDINGS: Stable cardiomegaly. Low lung volumes with diffuse interstitial prominence. Mild aortic atherosclerosis at the arch. No aneurysm. ACDF of the included lower cervical spine. Osteoarthritis of the glenohumeral joints left worse than right with remodeled appearance of the glenoid fossa and inferomedial spurring of the humeral head noted. IMPRESSION: Low lung volumes with mild bilateral diffuse interstitial prominence, nonspecific but may reflect chronic interstitial lung disease, small airway inflammation and/or interstitial edema some considerations. No acute bacterial pneumonia. Electronically Signed   By: Tollie Eth M.D.   On: 06/23/2018 22:56     ASSESSMENT AND PLAN:   84 year old male with history of chronic combined systolic and diastolic heart failure ejection fraction 25-30%, atrial fibrillation, diabetes and dementia who presents from home via EMS due to increased confusion.  1.  Acute metabolic encephalopathy in the setting of community-acquired pneumonia, CHF exacerbation and hypokalemia on top of underlying dementia. Neurology consultation appreciated. Follow-up on MRI due to abnormal CT scan head   2.  Acute on chronic combined systolic and diastolic heart failure ejection fraction 25 to 30%: Continue Lasix IV twice daily Monitor intake and output with daily weight BMP for a.m. CHF clinic referral upon discharge  3.  Community-acquired pneumonia: Continue Rocephin and azithromycin Patient does not have UTI   4.  Hypokalemia: Pharmacy consultation for replacement  5.  Right lower extremity edema: Check lower extremity Doppler rule out DVT 6.  PAF/atrial flutter: Continue Coreg for heart rate control Continue Eliquis 7.  BPH: Continue Hytrin  8.  Essential hypertension: Continue Cozaar and Coreg   Management plans discussed with nursing staff   CODE STATUS: full  TOTAL TIME TAKING CARE OF THIS PATIENT: 30 minutes.    Physical therapy consult  POSSIBLE D/C 1-2 days, DEPENDING ON CLINICAL CONDITION.   Albertine Lafoy M.D on 06/24/2018 at 12:12 PM  Between 7am to 6pm - Pager - [phone] After 6pm go to [email or web address] - password [email or web address]  [facility]  Office  [phone]  CC: Primary care physician; [facility]  Note: This dictation was prepared with Dragon dictation along with smaller phrase technology. Any transcriptional errors that result from this process are unintentional.

## 2018-06-24 NOTE — Consult Note (Signed)
PHARMACY CONSULT -ELECTROLYTES  Patient is an 84 year old male with PMH of CHF, COPD, DM, Afib- presents with electrolyte abnormalities, SOB.  Pt is on lasix 40mg  po at home as well as KCL daily K =2.5 Mg=1.7 Pt has received a total of 60 mg of IV lasix since admission  Currently has 5 runs of IV 10 MEQ KCL bags running Received 20 MEQ KCL po this AM  I will order Mg 2g IV once (goal mg level >2) Will increase KCL to 40 MEQ TID x 4 doses (goal K >4) Check another level at 1800  Nickey Kloepfer D Kashari Chalmers, Pharm.D, BCPS Clinical Pharmacist

## 2018-06-24 NOTE — Progress Notes (Signed)
Pharmacy Electrolyte Monitoring Consult:  Pharmacy consulted to assist in monitoring and replacing electrolytes in this 84 y.o. male admitted on 06/23/2018 with Altered Mental Status   Labs:  Sodium (mmol/L)  Date Value  06/24/2018 145  06/06/2013 140   Potassium (mmol/L)  Date Value  06/24/2018 2.5 (LL)  06/06/2013 3.5   Magnesium (mg/dL)  Date Value  66/12/43 1.7   Phosphorus (mg/dL)  Date Value  99/77/4142 4.3   Calcium (mg/dL)  Date Value  39/53/2023 8.7 (L)   Calcium, Total (mg/dL)  Date Value  34/35/6861 8.5   Albumin (g/dL)  Date Value  68/37/2902 3.2 (L)  06/05/2013 3.5    Assessment/Plan: Will replace w/ KCI 10 mEq IV x 5 and will recheck electrolytes @ 1200. Patient is also on KCI po 20 mEq daily; however currently NPO.  Thomasene Ripple, PharmD, BCPS Clinical Pharmacist 06/24/2018

## 2018-06-24 NOTE — Progress Notes (Signed)
PT Cancellation Note  Patient Details Name: Bobby Krueger MRN: [id number] DOB: 11-03-1926   Cancelled Treatment:    Reason Eval/Treat Not Completed: Medical issues which prohibited therapy : Potasium is 2.5; this is a contraindication to treatment.   [address] [place], [place] DPT 06/24/2018, 12:02 PM

## 2018-06-24 NOTE — Progress Notes (Signed)
Patient's IV noted to be infiltrated after IV rocephin administered.  Extravasation noted above IV site.  Spoke with pharmacy, who said to attempt to aspirate any leftover antibiotic first, then try warm compresses if that didn't work.  IV discontinued after unsuccessful aspiration and warm compresses applied.  This RN started another IV as well.  Warm compresses are working.

## 2018-06-24 NOTE — Consult Note (Signed)
  PHARMACY CONSULT NOTE - FOLLOW UP  Pharmacy Consult for Electrolyte Monitoring and Replacement   Recent Labs: Potassium (mmol/L)  Date Value  06/24/2018 3.4 (L)  06/06/2013 3.5   Magnesium (mg/dL)  Date Value  95/63/8756 2.2   Calcium (mg/dL)  Date Value  43/32/9518 8.7 (L)   Calcium, Total (mg/dL)  Date Value  84/16/6063 8.5   Albumin (g/dL)  Date Value  01/60/1093 3.2 (L)  06/05/2013 3.5   Phosphorus (mg/dL)  Date Value  23/55/7322 4.3  ]   Assessment: Pt Mg WNL's - Pt potassium is still slightly low @ 3.4  Pt is on po tid scheduled as well as IV lasix 40mg  bid  Goal of Therapy:  Electrolytes WNL's  Plan:  Continue pt's scheduled potassium and recheck potassium and magnesium tomorrow AM  Albina Billet ,PharmD Clinical Pharmacist 06/24/2018 4:44 PM

## 2018-06-25 ENCOUNTER — Inpatient Hospital Stay: Payer: Medicare Other

## 2018-06-25 LAB — BASIC METABOLIC PANEL
Anion gap: 8 (ref 5–15)
BUN: 18 mg/dL (ref 8–23)
CO2: 36 mmol/L — AB (ref 22–32)
Calcium: 9 mg/dL (ref 8.9–10.3)
Chloride: 105 mmol/L (ref 98–111)
Creatinine, Ser: 1.1 mg/dL (ref 0.61–1.24)
GFR calc Af Amer: >60 mL/min (ref >60)
GFR calc non Af Amer: 59 mL/min — ABNORMAL LOW (ref >60)
Glucose, Bld: 166 mg/dL — ABNORMAL HIGH (ref 70–99)
Potassium: 4 mmol/L (ref 3.5–5.1)
Sodium: 149 mmol/L — ABNORMAL HIGH (ref 135–145)

## 2018-06-25 LAB — MAGNESIUM: Magnesium: 2 mg/dL (ref 1.7–2.4)

## 2018-06-25 LAB — CBC
HCT: 35.5 % — ABNORMAL LOW (ref 39.0–52.0)
Hemoglobin: 10.7 g/dL — ABNORMAL LOW (ref 13.0–17.0)
MCH: 27.3 pg (ref 26.0–34.0)
MCHC: 30.1 g/dL (ref 30.0–36.0)
MCV: 90.6 fL (ref 80.0–100.0)
Platelets: 141 10*3/uL — ABNORMAL LOW (ref 150–400)
RBC: 3.92 MIL/uL — ABNORMAL LOW (ref 4.22–5.81)
RDW: 14.7 % (ref 11.5–15.5)
WBC: 5.1 10*3/uL (ref 4.0–10.5)
nRBC: 0 % (ref 0.0–0.2)

## 2018-06-25 LAB — GLUCOSE, CAPILLARY
Glucose-Capillary: 161 mg/dL — ABNORMAL HIGH (ref 70–99)
Glucose-Capillary: 145 mg/dL — ABNORMAL HIGH (ref 70–99)
Glucose-Capillary: 163 mg/dL — ABNORMAL HIGH (ref 70–99)
Glucose-Capillary: 306 mg/dL — ABNORMAL HIGH (ref 70–99)

## 2018-06-25 MED ORDER — LORAZEPAM 2 MG/ML IJ SOLN
INTRAMUSCULAR | Status: AC
  Filled 2018-06-25: qty 1

## 2018-06-25 MED ORDER — FUROSEMIDE 10 MG/ML IJ SOLN
40.0000 mg | Freq: Every day | INTRAMUSCULAR | Status: DC
  Administered 2018-06-26 – 2018-06-27 (×2): 40 mg via INTRAVENOUS
  Filled 2018-06-25 (×2): qty 4

## 2018-06-25 MED ORDER — LORAZEPAM 2 MG/ML IJ SOLN
1.0000 mg | Freq: Once | INTRAMUSCULAR | Status: AC
  Administered 2018-06-25: 1 mg via INTRAVENOUS

## 2018-06-25 NOTE — Progress Notes (Signed)
MRI called to request second attempt to complete patient's MRI, which was unsuccessful yesterday due to patient's claustrophobia once in the machine. MRI request from RN to obtain medication order from MD for sedation. MD contacted and informed of request and pending MRI. Order obtained for Ativan 1mg  IV once to complete MRI.

## 2018-06-25 NOTE — Progress Notes (Signed)
MD notified of second MRI attempt unsuccessful. Neuro to consult on patient. Awaiting neuro suggestions/ plan

## 2018-06-25 NOTE — Progress Notes (Signed)
PT Cancellation Note  Patient Details Name: Bobby Krueger MRN: [id number] DOB: 09/16/1926   Cancelled Treatment:    Reason Eval/Treat Not Completed: Other (comment).  PT consult received.  Chart reviewed.  Nurse present in pt's room and reported pt was currently being prepared to go for MRI and was given Ativan.  Will re-attempt PT evaluation at a later date/time.  Hendricks Limes, PT 06/25/18, 8:42 AM [phone]

## 2018-06-25 NOTE — Evaluation (Signed)
Physical Therapy Evaluation Patient Details Name: Bobby Krueger MRN: [id number] DOB: [birth year] Today's Date: 06/25/2018   History of Present Illness  Pt is an 84 y.o. male presenting to hospital 06/23/18 with altered mental status; pt also noted to be combative, SOB, and wheezing.  Pt admitted with acute on chronic encephalopathy, acute UTI, acute CHF exacerbation, acute respiratory distress, and hypokalemia.  CT of head showing potentially new L parietal versus old/subacute hypodensity (MRI attempted to clarify but has not been successful).  PMH includes a-fib, systolic and diastolic CHF, COPD, DM, htn, CVA, bradycardia, back surgery, CTR, dementia.  Clinical Impression  Pt would not answer most of PT's questions (not even his name); pt focusing on trying to get hand mitts off B hands most of session.  Pt did follow some directions but inconsistent with 1 step commands and as session continued was more difficult to re-direct limiting session's activities.  Pt able to stand with min to mod assist and take a few steps in place but requiring B UE support for balance.  Pt fatigued with standing activities and suddenly sat down (therapist assisted pt with safely sitting in recliner).  Pt would benefit from skilled PT to address noted impairments and functional limitations (see below for any additional details).  Upon hospital discharge, pt may benefit from STR pending pt's progress with therapy and ability to participate in therapy.    Follow Up Recommendations SNF    Equipment Recommendations  Rolling walker with 5" wheels    Recommendations for Other Services OT consult     Precautions / Restrictions Precautions Precautions: Fall Precaution Comments: Aspiration Restrictions Weight Bearing Restrictions: No      Mobility  Bed Mobility               General bed mobility comments: Deferred (pt up in chair beginning/end of session)  Transfers Overall transfer level: Needs  assistance Equipment used: None Transfers: Sit to/from Stand Sit to Stand: Mod assist;Min assist         General transfer comment: mod assist to stand 1st trial and min assist to stand 2nd trial from recliner; tactile cues for UE placement required  Ambulation/Gait Ambulation/Gait assistance: Min assist   Assistive device: IV Pole   Gait velocity: decreased   General Gait Details: pt marched in place B LE's x3 reps each but limited d/t fatigue; decreased B step height/foot clearance noted  Stairs            Wheelchair Mobility    Modified Rankin (Stroke Patients Only)       Balance Overall balance assessment: Needs assistance Sitting-balance support: No upper extremity supported;Feet supported Sitting balance-Leahy Scale: Good Sitting balance - Comments: steady sitting reaching within BOS   Standing balance support: No upper extremity supported Standing balance-Leahy Scale: Poor Standing balance comment: pt requiring at least single UE support for static standing balance                             Pertinent Vitals/Pain Pain Assessment: Faces Faces Pain Scale: No hurt Pain Intervention(s): Limited activity within patient's tolerance;Repositioned  HR WFL during session.    Home Living Family/patient expects to be discharged to:: Unsure(pt unable to verbalize home living when asked (per chart pt was at a private residence more than a year ago))                      Prior Function Level  of Independence: (Per chart pt was independent with assistive device more than a year ago but pt unable to verbalize when asked)               Hand Dominance        Extremity/Trunk Assessment   Upper Extremity Assessment Upper Extremity Assessment: Difficult to assess due to impaired cognition;Generalized weakness    Lower Extremity Assessment Lower Extremity Assessment: Difficult to assess due to impaired cognition;Generalized weakness     Cervical / Trunk Assessment Cervical / Trunk Assessment: Normal  Communication   Communication: HOH  Cognition Arousal/Alertness: Awake/alert Behavior During Therapy: Impulsive;Restless Overall Cognitive Status: No family/caregiver present to determine baseline cognitive functioning(Pt would not state name when asked (therapist looked at wrist band to verify pt's identify); pt did not answer any other cognition questions)                                        General Comments General comments (skin integrity, edema, etc.): Pt with B hand mitts donned upon PT entry and sitting in chair.  Pt's sitter reporting ok to attempt PT evaluation.    Exercises     Assessment/Plan    PT Assessment Patient needs continued PT services  PT Problem List Decreased strength;Decreased activity tolerance;Decreased balance;Decreased mobility;Decreased cognition;Decreased knowledge of use of DME;Decreased safety awareness;Decreased knowledge of precautions       PT Treatment Interventions DME instruction;Gait training;Stair training;Functional mobility training;Therapeutic activities;Therapeutic exercise;Balance training;Patient/family education    PT Goals (Current goals can be found in the Care Plan section)  Acute Rehab PT Goals Patient Stated Goal: to get hand mitts off PT Goal Formulation: With patient Time For Goal Achievement: 07/09/18 Potential to Achieve Goals: Good    Frequency Min 2X/week   Barriers to discharge Decreased caregiver support;Other (comment) Question level of assist available at home    Co-evaluation               AM-PAC PT "6 Clicks" Mobility  Outcome Measure Help needed turning from your back to your side while in a flat bed without using bedrails?: A Little Help needed moving from lying on your back to sitting on the side of a flat bed without using bedrails?: A Little Help needed moving to and from a bed to a chair (including a  wheelchair)?: A Lot Help needed standing up from a chair using your arms (e.g., wheelchair or bedside chair)?: A Lot Help needed to walk in hospital room?: A Lot Help needed climbing 3-5 steps with a railing? : Total 6 Click Score: 13    End of Session Equipment Utilized During Treatment: Gait belt;Oxygen Activity Tolerance: Patient limited by fatigue Patient left: in chair;with nursing/sitter in room;Other (comment)(Sitter present) Nurse Communication: Mobility status;Precautions PT Visit Diagnosis: Other abnormalities of gait and mobility (R26.89);Unsteadiness on feet (R26.81);Muscle weakness (generalized) (M62.81)    Time: 3557-3220 PT Time Calculation (min) (ACUTE ONLY): 14 min   Charges:   PT Evaluation $PT Eval Low Complexity: 1 Low        Leeon Makar, PT 06/25/18, 3:22 PM [phone]

## 2018-06-25 NOTE — Progress Notes (Signed)
MRI called stating patient was "wild as a buck" and combative. MRI staff unable to complete MRI due to patient being uncooperative and agitated at this time.

## 2018-06-25 NOTE — Progress Notes (Addendum)
[facility] - [place] [facility]   PATIENT NAME: Bobby Krueger    MR#:  [id number]  DATE OF BIRTH:  1927/02/10  SUBJECTIVE:  Patient was unable to obtain MRI because he was agitated. He is now sedated from the Ativan  REVIEW OF SYSTEMS:    Patient with dementia a  Tolerating Diet: yes      DRUG ALLERGIES:   Allergies  Allergen Reactions  . Aldactone [Spironolactone] Hives  . Aspirin Hives  . Ciprofloxacin Hives  . Cyclobenzaprine Hives  . Lexapro [Escitalopram] Hives    VITALS:  Blood pressure 136/85, pulse 67, temperature (!) 96.5 F (35.8 C), resp. rate 18, height 5\' 6"  (1.676 m), weight 74.2 kg, SpO2 96 %.  PHYSICAL EXAMINATION:  Constitutional: Appears well-developed and well-nourished. No distress. HENT: Normocephalic. Marland Kitchen Oropharynx is clear and moist.  Eyes: Conjunctivae are normal.  no scleral icterus.  Neck: No JVD. No tracheal deviation. CVS: RRR, S1/S2 +, no murmurs, no gallops, no carotid bruit.  Pulmonary: Normal respiratory effort with bilateral rhonchi and crackles at the bases  abdominal: Soft. BS +,  no distension, tenderness, rebound or guarding.  Musculoskeletal:. No edema and no tenderness.  Neuro patient is sedated from Ativan for MRI   skin: Skin is warm and dry. No rash noted. Psychiatric: sedated from ativan   LABORATORY PANEL:   CBC Recent Labs  Lab 06/25/18 0443  WBC 5.1  HGB 10.7*  HCT 35.5*  PLT 141*   ------------------------------------------------------------------------------------------------------------------  Chemistries  Recent Labs  Lab 06/23/18 1706  06/25/18 0443  NA 142   < > 149*  K 2.7*   < > 4.0  CL 99   < > 105  CO2 35*   < > 36*  GLUCOSE 228*   < > 166*  BUN 14   < > 18  CREATININE 0.99   < > 1.10  CALCIUM 8.9   < > 9.0  MG  --    < > 2.0  AST 23  --   --   ALT 18  --   --   ALKPHOS 100  --   --   BILITOT 0.5  --   --    < > = values in this interval not displayed.    ------------------------------------------------------------------------------------------------------------------  Cardiac Enzymes Recent Labs  Lab 06/23/18 2023  TROPONINI 0.05*   ------------------------------------------------------------------------------------------------------------------  RADIOLOGY:  Dg Chest 1 View  Result Date: 06/25/2018 CLINICAL DATA:  CHF exacerbation EXAM: CHEST  1 VIEW COMPARISON:  06/23/2018 FINDINGS: Bilateral diffuse interstitial thickening. No significant pleural effusion or pneumothorax. No focal consolidation. Stable cardiomegaly. No acute osseous abnormality. Severe osteoarthritis of the left glenohumeral joint. Mild osteoarthritis of the right glenohumeral joint. Prior anterior cervical fusion. IMPRESSION: Diffuse bilateral interstitial thickening and cardiomegaly concerning for mild CHF. Electronically Signed   By: Elige Ko   On: 06/25/2018 09:50   Ct Head Wo Contrast  Result Date: 06/23/2018 CLINICAL DATA:  Dementia, stroke symptoms EXAM: CT HEAD WITHOUT CONTRAST TECHNIQUE: Contiguous axial images were obtained from the base of the skull through the vertex without intravenous contrast. COMPARISON:  Brain MRI 03/23/2016 FINDINGS: Brain: New large field of subcortical hypodensity / edema in the high LEFT frontal lobe which extends into the lateral temporal lobe. This hypodensity extends in the cortex of the temporal lobe. There is marked decreased density centrally within this lesion suggesting remote infarction. Extensive periventricular white matter hypodensities.  Unchanged No acute intracranial hemorrhage.  Midline shift or mass effect.  Vascular: No hyperdense vessel or unexpected calcification. Skull: Normal. Negative for fracture or focal lesion. Sinuses/Orbits: Paranasal sinuses and mastoid air cells are clear. Orbits are clear. Other: None. IMPRESSION: 1. Large region hypodensity in the LEFT frontal lobe extending into the lateral LEFT temporal  lobe. While this pattern suggest vasogenic edema, favor findings to represent a large deep REMOTE white matter infarction with some cortical extension rather than a mass lesion. 2. Extensive periventricular white matter disease similar to comparison MRI. Electronically Signed   By: Genevive Bi M.D.   On: 06/23/2018 20:00   US Venous Img Lower Unilateral Right  Result Date: 06/24/2018 CLINICAL DATA:  Right lower extremity edema. EXAM: RIGHT LOWER EXTREMITY VENOUS DOPPLER ULTRASOUND TECHNIQUE: Gray-scale sonography with graded compression, as well as color Doppler and duplex ultrasound were performed to evaluate the lower extremity deep venous systems from the level of the common femoral vein and including the common femoral, femoral, profunda femoral, popliteal and calf veins including the posterior tibial, peroneal and gastrocnemius veins when visible. The superficial great saphenous vein was also interrogated. Spectral Doppler was utilized to evaluate flow at rest and with distal augmentation maneuvers in the common femoral, femoral and popliteal veins. COMPARISON:  None. FINDINGS: Contralateral Common Femoral Vein: Respiratory phasicity is normal and symmetric with the symptomatic side. No evidence of thrombus. Normal compressibility. Common Femoral Vein: No evidence of thrombus. Normal compressibility, respiratory phasicity and response to augmentation. Saphenofemoral Junction: No evidence of thrombus. Normal compressibility and flow on color Doppler imaging. Profunda Femoral Vein: No evidence of thrombus. Normal compressibility and flow on color Doppler imaging. Femoral Vein: No evidence of thrombus. Normal compressibility, respiratory phasicity and response to augmentation. Popliteal Vein: No evidence of thrombus. Normal compressibility, respiratory phasicity and response to augmentation. Calf Veins: No evidence of thrombus. Normal compressibility and flow on color Doppler imaging. Superficial Great  Saphenous Vein: No evidence of thrombus. Normal compressibility. Venous Reflux:  None. Other Findings: No evidence of superficial thrombophlebitis or abnormal fluid collection. IMPRESSION: No evidence of right lower extremity deep venous thrombosis. Electronically Signed   By: Irish Lack M.D.   On: 06/24/2018 14:23   Dg Chest Port 1 View  Result Date: 06/23/2018 CLINICAL DATA:  Dyspnea and wheeze EXAM: PORTABLE CHEST 1 VIEW COMPARISON:  03/25/2018 FINDINGS: Stable cardiomegaly. Low lung volumes with diffuse interstitial prominence. Mild aortic atherosclerosis at the arch. No aneurysm. ACDF of the included lower cervical spine. Osteoarthritis of the glenohumeral joints left worse than right with remodeled appearance of the glenoid fossa and inferomedial spurring of the humeral head noted. IMPRESSION: Low lung volumes with mild bilateral diffuse interstitial prominence, nonspecific but may reflect chronic interstitial lung disease, small airway inflammation and/or interstitial edema some considerations. No acute bacterial pneumonia. Electronically Signed   By: Tollie Eth M.D.   On: 06/23/2018 22:56     ASSESSMENT AND PLAN:   84 year old male with history of chronic combined systolic and diastolic heart failure ejection fraction 25-30%, atrial fibrillation, diabetes and dementia who presents from home via EMS due to increased confusion.  1.  Acute metabolic encephalopathy in the setting of community-acquired pneumonia, CHF exacerbation and hypokalemia on top of underlying dementia. Neurology consultation appreciated. We can try to obtain MRI one more time.  If he remains agitated despite giving sedatives we may have to discontinue the MRI.   2.  Acute on chronic combined systolic and diastolic heart failure ejection fraction 25 to 30%: Continue Lasix IV  Today's chest x-ray showed CHF. Monitor  intake and output with daily weight BMP for a.m. CHF clinic referral upon discharge  3.   Community-acquired pneumonia: Continue Rocephin and azithromycin Patient does not have UTI   4.  Hypokalemia: Replete PRN   5.  Right lower extremity edema: Doppler was negative for DVT. 6.  PAF/atrial flutter: Continue Coreg for heart rate control Continue Eliquis 7.  BPH: Continue Hytrin  8.  Essential hypertension: Continue Coreg and losartan 9.  Hyponatremia: I will decrease dose of Lasix and recheck sodium level in a.m.  Management plans discussed with nursing staff   CODE STATUS: full  TOTAL TIME TAKING CARE OF THIS PATIENT:24 minutes.   Physical therapy consult  POSSIBLE D/C 1-2 days, DEPENDING ON CLINICAL CONDITION.   Isra Lindy M.D on 06/25/2018 at 11:28 AM  Between 7am to 6pm - Pager - [phone] After 6pm go to [email or web address] - password [email or web address]  [facility]  Office  [phone]  CC: Primary care physician; [facility]  Note: This dictation was prepared with Dragon dictation along with smaller phrase technology. Any transcriptional errors that result from this process are unintentional.

## 2018-06-25 NOTE — Consult Note (Signed)
  PHARMACY CONSULT NOTE - FOLLOW UP  Pharmacy Consult for Electrolyte Monitoring and Replacement   Recent Labs: Potassium (mmol/L)  Date Value  06/25/2018 4.0  06/06/2013 3.5   Magnesium (mg/dL)  Date Value  32/67/1245 2.0   Calcium (mg/dL)  Date Value  80/99/8338 9.0   Calcium, Total (mg/dL)  Date Value  25/11/3974 8.5   Albumin (g/dL)  Date Value  73/41/9379 3.2 (L)  06/05/2013 3.5   Phosphorus (mg/dL)  Date Value  02/40/9735 4.3  ]   Assessment: K 4.0, Mag 2.0  Goal of Therapy:  Electrolytes WNL's  Plan:  No additional supplmentation needed at this time  Recheck in AM  Marty Heck ,PharmD Clinical Pharmacist 06/25/2018 7:08 AM

## 2018-06-25 NOTE — Progress Notes (Signed)
Patient transported to MRI, by orderly, with safety sitter present. MRI staff and CCMD informed of patient with cardiac monitoring, medication administered and potential need to remove monitoring device. Also, John (MRI) was informed to closely monitor patient while cardiac monitor not in place, due to medication given to patient for sedation. Will continue to communicate with staff/ dept involved in patient's care, while patient in currently off nursing unit.

## 2018-06-26 LAB — GLUCOSE, CAPILLARY
GLUCOSE-CAPILLARY: 144 mg/dL — AB (ref 70–99)
Glucose-Capillary: 148 mg/dL — ABNORMAL HIGH (ref 70–99)
Glucose-Capillary: 225 mg/dL — ABNORMAL HIGH (ref 70–99)
Glucose-Capillary: 87 mg/dL (ref 70–99)

## 2018-06-26 LAB — BASIC METABOLIC PANEL
ANION GAP: 7 (ref 5–15)
BUN: 24 mg/dL — ABNORMAL HIGH (ref 8–23)
CO2: 34 mmol/L — ABNORMAL HIGH (ref 22–32)
Calcium: 8.6 mg/dL — ABNORMAL LOW (ref 8.9–10.3)
Chloride: 101 mmol/L (ref 98–111)
Creatinine, Ser: 1 mg/dL (ref 0.61–1.24)
GFR calc Af Amer: >60 mL/min (ref >60)
GFR calc non Af Amer: >60 mL/min (ref >60)
GLUCOSE: 103 mg/dL — AB (ref 70–99)
Potassium: 3.5 mmol/L (ref 3.5–5.1)
Sodium: 142 mmol/L (ref 135–145)

## 2018-06-26 LAB — CBC
HCT: 33.5 % — ABNORMAL LOW (ref 39.0–52.0)
Hemoglobin: 10.2 g/dL — ABNORMAL LOW (ref 13.0–17.0)
MCH: 27.3 pg (ref 26.0–34.0)
MCHC: 30.4 g/dL (ref 30.0–36.0)
MCV: 89.8 fL (ref 80.0–100.0)
NRBC: 0 % (ref 0.0–0.2)
Platelets: 156 10*3/uL (ref 150–400)
RBC: 3.73 MIL/uL — ABNORMAL LOW (ref 4.22–5.81)
RDW: 14.5 % (ref 11.5–15.5)
WBC: 5.3 10*3/uL (ref 4.0–10.5)

## 2018-06-26 LAB — MAGNESIUM: Magnesium: 2 mg/dL (ref 1.7–2.4)

## 2018-06-26 MED ORDER — AZITHROMYCIN 250 MG PO TABS
500.0000 mg | ORAL_TABLET | Freq: Every day | ORAL | Status: DC
  Administered 2018-06-27 – 2018-06-29 (×3): 500 mg via ORAL
  Filled 2018-06-26 (×3): qty 2

## 2018-06-26 MED ORDER — OLANZAPINE 10 MG IM SOLR
5.0000 mg | Freq: Once | INTRAMUSCULAR | Status: AC
  Administered 2018-06-26: 5 mg via INTRAMUSCULAR
  Filled 2018-06-26: qty 10

## 2018-06-26 MED ORDER — POTASSIUM CHLORIDE CRYS ER 20 MEQ PO TBCR
40.0000 meq | EXTENDED_RELEASE_TABLET | Freq: Once | ORAL | Status: AC
  Administered 2018-06-26: 40 meq via ORAL
  Filled 2018-06-26: qty 2

## 2018-06-26 MED ORDER — SODIUM CHLORIDE 0.9 % IV SOLN
INTRAVENOUS | Status: DC | PRN
  Administered 2018-06-26: 500 mL via INTRAVENOUS

## 2018-06-26 MED ORDER — CEFDINIR 300 MG PO CAPS
300.0000 mg | ORAL_CAPSULE | Freq: Two times a day (BID) | ORAL | Status: DC
  Administered 2018-06-27 – 2018-06-29 (×5): 300 mg via ORAL
  Filled 2018-06-26 (×7): qty 1

## 2018-06-26 MED ORDER — SODIUM CHLORIDE 0.9% FLUSH
3.0000 mL | Freq: Two times a day (BID) | INTRAVENOUS | Status: DC
  Administered 2018-06-26 – 2018-06-29 (×5): 3 mL via INTRAVENOUS

## 2018-06-26 NOTE — Progress Notes (Addendum)
Subjective: Patient awake, alert and oriented to person and place.  Denies any concerns.  Sitter at bedside  Objective: Current vital signs: BP 127/77 (BP Location: Right Arm)   Pulse 66   Temp (!) 97.4 F (36.3 C)   Resp 18   Ht 5\' 6"  (1.676 m)   Wt 75.3 kg   SpO2 100%   BMI 26.79 kg/m  Vital signs in last 24 hours: Temp:  [97.4 F (36.3 C)-98.4 F (36.9 C)] 97.4 F (36.3 C) (12/09 0741) Pulse Rate:  [64-72] 66 (12/09 0741) Resp:  [18-20] 18 (12/09 0741) BP: (117-133)/(71-98) 127/77 (12/09 0741) SpO2:  [100 %] 100 % (12/09 0424) Weight:  [75.3 kg] 75.3 kg (12/09 0630)  Intake/Output from previous day: 12/08 0701 - 12/09 0700 In: 1080 [P.O.:1080] Out: 250 [Urine:250] Intake/Output this shift: No intake/output data recorded. Nutritional status:  Diet Order            DIET - DYS 1 Room service appropriate? Yes with Assist; Fluid consistency: Thin  Diet effective now             Neurological Exam   Mental Status: Alert, oriented to person and place. On oxygen via nasal cannula. Thought content appear appropriate.  Speech fluent without evidence of aphasia.  Able to follow 2 step commands without difficulty. Attention span and concentration seemed appropriate  Cranial Nerves: II: Discs flat bilaterally; Visual fields grossly normal, pupils equal, round, reactive to light and accommodation III,IV, VI: ptosis not present, extra-ocular motions intact bilaterally V,VII: smile symmetric, facial light touch sensation intact VIII: hearing normal bilaterally IX,X: gag reflex present XI: bilateral shoulder shrug XII: midline tongue extension Motor: Right :  Upper extremity   5/5 Without pronator drift      Left: Upper extremity   5/5 without pronator drift Right:   Lower extremity   5/5                                          Left: Lower extremity   5/5 Tone and bulk:normal tone throughout; no atrophy noted Sensory: Pinprick and light touch intact bilaterally Deep Tendon  Reflexes: 2+ and symmetric throughout Plantars: Right: mute                              Left: mute Cerebellar: Finger-to-nose testing intact bilaterally required multiple prompts to perform Gait: not tested due to safety concerns  Lab Results: Basic Metabolic Panel: Recent Labs  Lab 06/23/18 1706 06/23/18 1752 06/24/18 0442 06/24/18 0917 06/24/18 1547 06/25/18 0443 06/26/18 0349  NA 142  --  145  --   --  149* 142  K 2.7*  --  2.5*  --  3.4* 4.0 3.5  CL 99  --  101  --   --  105 101  CO2 35*  --  35*  --   --  36* 34*  GLUCOSE 228*  --  186*  --   --  166* 103*  BUN 14  --  15  --   --  18 24*  CREATININE 0.99  --  1.05  --   --  1.10 1.00  CALCIUM 8.9  --  8.7*  --   --  9.0 8.6*  MG  --  1.7  --  1.7 2.2 2.0 2.0    Liver Function  Tests: Recent Labs  Lab 06/23/18 1706  AST 23  ALT 18  ALKPHOS 100  BILITOT 0.5  PROT 7.0  ALBUMIN 3.2*   No results for input(s): LIPASE, AMYLASE in the last 168 hours. No results for input(s): AMMONIA in the last 168 hours.  CBC: Recent Labs  Lab 06/23/18 1706 06/24/18 0442 06/25/18 0443 06/26/18 0349  WBC 5.9 6.7 5.1 5.3  HGB 11.0* 9.9* 10.7* 10.2*  HCT 35.6* 33.4* 35.5* 33.5*  MCV 89.7 90.5 90.6 89.8  PLT 159 150 141* 156    Cardiac Enzymes: Recent Labs  Lab 06/23/18 2023  TROPONINI 0.05*    Lipid Panel: No results for input(s): CHOL, TRIG, HDL, CHOLHDL, VLDL, LDLCALC in the last 168 hours.  CBG: Recent Labs  Lab 06/24/18 2054 06/25/18 0957 06/25/18 1150 06/25/18 1615 06/25/18 2049  GLUCAP 171* 161* 145* 163* 306*    Microbiology: Results for orders placed or performed during the hospital encounter of 04/08/18  Urine culture     Status: None   Collection Time: 04/08/18 11:43 AM  Result Value Ref Range Status   Specimen Description   Final    URINE, RANDOM Performed at [facility], [address], [place], [place] [address]    Special Requests   Final    NONE Performed at [facility], [address], [place], [place] [address]    Culture   Final    NO GROWTH Performed at [facility], [address] [place], [place] [address]    Report Status 04/09/2018 FINAL  Final    Coagulation Studies: No results for input(s): LABPROT, INR in the last 72 hours.  Imaging: Dg Chest 1 View  Result Date: 06/25/2018 CLINICAL DATA:  CHF exacerbation EXAM: CHEST  1 VIEW COMPARISON:  06/23/2018 FINDINGS: Bilateral diffuse interstitial thickening. No significant pleural effusion or pneumothorax. No focal consolidation. Stable cardiomegaly. No acute osseous abnormality. Severe osteoarthritis of the left glenohumeral joint. Mild osteoarthritis of the right glenohumeral joint. Prior anterior cervical fusion. IMPRESSION: Diffuse bilateral interstitial thickening and cardiomegaly concerning for mild CHF. Electronically Signed   By: Elige Ko   On: 06/25/2018 09:50   US Venous Img Lower Unilateral Right  Result Date: 06/24/2018 CLINICAL DATA:  Right lower extremity edema. EXAM: RIGHT LOWER EXTREMITY VENOUS DOPPLER ULTRASOUND TECHNIQUE: Gray-scale sonography with graded compression, as well as color Doppler and duplex ultrasound were performed to evaluate the lower extremity deep venous systems from the level of the common femoral vein and including the common femoral, femoral, profunda femoral, popliteal and calf veins including the posterior tibial, peroneal and gastrocnemius veins when visible. The superficial great saphenous vein was also interrogated. Spectral Doppler was utilized to evaluate flow at rest and with distal augmentation maneuvers in the common femoral, femoral and popliteal veins. COMPARISON:  None. FINDINGS: Contralateral Common Femoral Vein: Respiratory phasicity is normal and symmetric with the symptomatic side. No evidence of thrombus. Normal compressibility. Common Femoral Vein: No evidence of thrombus. Normal compressibility, respiratory phasicity and  response to augmentation. Saphenofemoral Junction: No evidence of thrombus. Normal compressibility and flow on color Doppler imaging. Profunda Femoral Vein: No evidence of thrombus. Normal compressibility and flow on color Doppler imaging. Femoral Vein: No evidence of thrombus. Normal compressibility, respiratory phasicity and response to augmentation. Popliteal Vein: No evidence of thrombus. Normal compressibility, respiratory phasicity and response to augmentation. Calf Veins: No evidence of thrombus. Normal compressibility and flow on color Doppler imaging. Superficial Great Saphenous Vein: No evidence of thrombus.  Normal compressibility. Venous Reflux:  None. Other Findings: No evidence of superficial thrombophlebitis or abnormal fluid collection. IMPRESSION: No evidence of right lower extremity deep venous thrombosis. Electronically Signed   By: Irish Lack M.D.   On: 06/24/2018 14:23    Medications:  I have reviewed the patient's current medications. Prior to Admission:  Medications Prior to Admission  Medication Sig Dispense Refill Last Dose  . Fluticasone-Salmeterol (ADVAIR DISKUS) 250-50 MCG/DOSE AEPB Inhale 1 puff into the lungs daily as needed (for wheezing or shortness).    prn at prn  . ipratropium-albuterol (DUONEB) 0.5-2.5 (3) MG/3ML SOLN Take 3 mLs by nebulization every 6 (six) hours as needed (for SOB).   prn at prn  . polyvinyl alcohol (LIQUIFILM TEARS) 1.4 % ophthalmic solution Place 1 drop into both eyes as needed for dry eyes. Reported on 12/21/2015   prn at prn  . triamcinolone cream (KENALOG) 0.1 % Apply 1 application topically 2 (two) times daily as needed (for irritation).   prn at prn  . albuterol (PROAIR HFA) 108 (90 Base) MCG/ACT inhaler Inhale 2 puffs into the lungs every 4 (four) hours as needed for wheezing or shortness of breath.    Past Month at Unknown time  . apixaban (ELIQUIS) 5 MG TABS tablet Take 1 tablet (5 mg total) by mouth 2 (two) times daily. 60 tablet 0  06/09/2016 at Unknown time  . atorvastatin (LIPITOR) 20 MG tablet Take 1 tablet (20 mg total) by mouth daily at 6 PM.   06/09/2016 at Unknown time  . azithromycin (ZITHROMAX Z-PAK) 250 MG tablet 1 tablet (250 mg) once daily on Days 2 through 5. 4 each 0   . brimonidine (ALPHAGAN) 0.2 % ophthalmic solution Place 1 drop into both eyes daily.    06/09/2016 at Unknown time  . carvedilol (COREG) 6.25 MG tablet Take 6.25 mg by mouth 2 (two) times daily with a meal.    06/09/2016 at Unknown time  . furosemide (LASIX) 40 MG tablet Take 1 tablet (40 mg total) by mouth daily. 30 tablet 0 06/09/2016 at Unknown time  . gabapentin (NEURONTIN) 300 MG capsule Take 300 mg by mouth at bedtime.   06/08/2016 at Unknown time  . ibuprofen (ADVIL,MOTRIN) 400 MG tablet Take 400 mg by mouth every 6 (six) hours as needed for moderate pain.   Past Week at Unknown time  . insulin glargine (LANTUS) 100 UNIT/ML injection Inject 0.08 mLs (8 Units total) into the skin at bedtime. (Patient taking differently: Inject 10 Units into the skin at bedtime. ) 10 mL 0 03/21/2016 at Unknown time  . insulin lispro (HUMALOG) 100 UNIT/ML injection Inject 0-9 Units into the skin 3 (three) times daily before meals. CBG's 70-120 = 0 units; 121-150 =1 units; 151-200 = 2 units; 201-250 =3 units; 251-300 =5 units; 301-350 =7 units; 351-400=9 units and > 400 Notify provider.   unknown at Unknown time  . latanoprost (XALATAN) 0.005 % ophthalmic solution Place 1 drop into both eyes at bedtime.   06/08/2016 at Unknown time  . losartan (COZAAR) 100 MG tablet Take 100 mg by mouth daily.   06/09/2016 at Unknown time  . nateglinide (STARLIX) 120 MG tablet Take 120 mg by mouth daily.   06/09/2016 at Unknown time  . Omega-3 1000 MG CAPS Take 1 g by mouth daily.   06/09/2016 at Unknown time  . omeprazole (PRILOSEC) 20 MG capsule Take 20 mg by mouth daily.   06/09/2016 at Unknown time  . potassium chloride SA (K-DUR,KLOR-CON)  20 MEQ tablet Take 20 mEq by mouth  daily.   06/09/2016 at Unknown time  . SPIRIVA HANDIHALER 18 MCG inhalation capsule Place 1 application into inhaler and inhale daily.   06/09/2016 at Unknown time  . terazosin (HYTRIN) 5 MG capsule Take 5 mg by mouth every evening.   06/09/2016 at Unknown time  . trimethoprim-polymyxin b (POLYTRIM) ophthalmic solution Place 2 drops into the right eye every 6 (six) hours. (Patient not taking: Reported on 06/09/2016) 10 mL 0 Not Taking at Unknown time   Scheduled: . apixaban  5 mg Oral BID  . atorvastatin  20 mg Oral q1800  . brimonidine  1 drop Both Eyes Daily  . carvedilol  6.25 mg Oral BID WC  . docusate sodium  100 mg Oral BID  . furosemide  40 mg Intravenous Daily  . gabapentin  300 mg Oral QHS  . insulin aspart  0-5 Units Subcutaneous QHS  . insulin aspart  0-9 Units Subcutaneous TID WC  . latanoprost  1 drop Both Eyes QHS  . losartan  100 mg Oral Daily  . pantoprazole  40 mg Oral Daily  . terazosin  5 mg Oral QPM  . tiotropium  18 mcg Inhalation Daily   Patient seen and examined.  Clinical course and management discussed.  Necessary edits performed.  I agree with the above.  Assessment and plan of care developed and discussed below.  Assessment: 84 y.o with past medical history of atrial fibrillation on anticoagulation, CHF, hypertension, OSA, diabetes mellitus, Dementia with behavioral disturbances, COPD, and CVA presenting with altered mental status and agitation. Found to have acute UTI and CHF exacerbation likely underlying cause of encephalopathy. CT head reviewed and showed chronic changes but potentially new left parietal old subacute hypodensity.  Plan: 1. MRI brain without contrast pending 2. Stop Aspirin and continue prophylaxis with Eliquis for afib given stroke risk factors 3. Agree with medical management of metabolic derangements 4. PT/OT and Speech evaluation 5. Frequent neuro checks  This patient was staffed with Dr. Verlon Au, Thad Ranger who personally evaluated  patient, reviewed documentation and agreed with assessment and plan of care as above.  Webb Silversmith, DNP, FNP-BC Board certified Nurse Practitioner Neurology Department    LOS: 3 days   06/26/2018  7:58 AM  Thana Farr, MD Neurology [phone]  06/26/2018  1:40 PM

## 2018-06-26 NOTE — Progress Notes (Signed)
[facility] [place] [facility]   PATIENT NAME: Bobby Krueger    MR#:  [id number]  DATE OF BIRTH:  1927-06-01  SUBJECTIVE:  Patient is sitting in chair and alert this morning.  Still with some confusion but appears to be at baseline REVIEW OF SYSTEMS:    Patient with dementia   Tolerating Diet: yes      DRUG ALLERGIES:   Allergies  Allergen Reactions  . Aldactone [Spironolactone] Hives  . Aspirin Hives  . Ciprofloxacin Hives  . Cyclobenzaprine Hives  . Lexapro [Escitalopram] Hives    VITALS:  Blood pressure 127/77, pulse 65, temperature (!) 97.4 F (36.3 C), resp. rate 20, height  (1.676 m), weight 75.3 kg, SpO2 97 %.  PHYSICAL EXAMINATION:  Constitutional: Appears well-developed and well-nourished. No distress. HENT: Normocephalic. Marland Kitchen Oropharynx is clear and moist.  Eyes: Conjunctivae are normal.  no scleral icterus.  Neck: No JVD. No tracheal deviation. CVS: Irregular, irregular S1/S2 +, no murmurs, no gallops, no carotid bruit.  Pulmonary: Normal respiratory effort with out rhonchi or crackles   abdominal: Soft. BS +,  no distension, tenderness, rebound or guarding.  Musculoskeletal:. No edema and no tenderness.  Neuro patient awake oriented to name skin: Skin is warm and dry. No rash noted. Psychiatric: Recently demented   LABORATORY PANEL:   CBC Recent Labs  Lab 06/26/18 0349  WBC 5.3  HGB 10.2*  HCT 33.5*  PLT 156   ------------------------------------------------------------------------------------------------------------------  Chemistries  Recent Labs  Lab 06/23/18 1706  06/26/18 0349  NA 142   < > 142  K 2.7*   < > 3.5  CL 99   < > 101  CO2 35*   < > 34*  GLUCOSE 228*   < > 103*  BUN 14   < > 24*  CREATININE 0.99   < > 1.00  CALCIUM 8.9   < > 8.6*  MG  --    < > 2.0  AST 23  --   --   ALT 18  --   --   ALKPHOS 100  --   --   BILITOT 0.5  --   --    < > = values in this interval not displayed.    ------------------------------------------------------------------------------------------------------------------  Cardiac Enzymes Recent Labs  Lab 06/23/18 2023  TROPONINI 0.05*   ------------------------------------------------------------------------------------------------------------------  RADIOLOGY:  Dg Chest 1 View  Result Date: 06/25/2018 CLINICAL DATA:  CHF exacerbation EXAM: CHEST  1 VIEW COMPARISON:  06/23/2018 FINDINGS: Bilateral diffuse interstitial thickening. No significant pleural effusion or pneumothorax. No focal consolidation. Stable cardiomegaly. No acute osseous abnormality. Severe osteoarthritis of the left glenohumeral joint. Mild osteoarthritis of the right glenohumeral joint. Prior anterior cervical fusion. IMPRESSION: Diffuse bilateral interstitial thickening and cardiomegaly concerning for mild CHF. Electronically Signed   By: Elige Ko   On: 06/25/2018 09:50   US Venous Img Lower Unilateral Right  Result Date: 06/24/2018 CLINICAL DATA:  Right lower extremity edema. EXAM: RIGHT LOWER EXTREMITY VENOUS DOPPLER ULTRASOUND TECHNIQUE: Gray-scale sonography with graded compression, as well as color Doppler and duplex ultrasound were performed to evaluate the lower extremity deep venous systems from the level of the common femoral vein and including the common femoral, femoral, profunda femoral, popliteal and calf veins including the posterior tibial, peroneal and gastrocnemius veins when visible. The superficial great saphenous vein was also interrogated. Spectral Doppler was utilized to evaluate flow at rest and with distal augmentation maneuvers in the common femoral, femoral and popliteal veins.  COMPARISON:  None. FINDINGS: Contralateral Common Femoral Vein: Respiratory phasicity is normal and symmetric with the symptomatic side. No evidence of thrombus. Normal compressibility. Common Femoral Vein: No evidence of thrombus. Normal compressibility, respiratory  phasicity and response to augmentation. Saphenofemoral Junction: No evidence of thrombus. Normal compressibility and flow on color Doppler imaging. Profunda Femoral Vein: No evidence of thrombus. Normal compressibility and flow on color Doppler imaging. Femoral Vein: No evidence of thrombus. Normal compressibility, respiratory phasicity and response to augmentation. Popliteal Vein: No evidence of thrombus. Normal compressibility, respiratory phasicity and response to augmentation. Calf Veins: No evidence of thrombus. Normal compressibility and flow on color Doppler imaging. Superficial Great Saphenous Vein: No evidence of thrombus. Normal compressibility. Venous Reflux:  None. Other Findings: No evidence of superficial thrombophlebitis or abnormal fluid collection. IMPRESSION: No evidence of right lower extremity deep venous thrombosis. Electronically Signed   By: Irish Lack M.D.   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  1.  Acute metabolic encephalopathy in the setting of community-acquired pneumonia, CHF exacerbation and hypokalemia on top of underlying dementia: Patient is at his baseline Neurology consultation appreciated. MRI attempted x3.  Patient unable to tolerate MRI.  Discussed with neurology and we will discontinue MRI.  2.  Acute on chronic combined systolic and diastolic heart failure ejection fraction 25 to 30%: Continue Lasix IV  Compression hose ordered  monitor intake and output with daily weight BMP for a.m. CHF clinic referral upon discharge  3.  Community-acquired pneumonia: Change to Ceftin and oral azithromycin. Patient does not have UTI   4.  Hypokalemia: Replete PRN   5.  Right lower extremity edema: Doppler was negative for DVT. 6.   PAF/atrial flutter: Continue Coreg for heart rate control Continue Eliquis 7.  BPH: Continue Hytrin  8.  Essential hypertension: Continue Coreg and losartan 9.  Hyponatremia: Resolved  DC sitter so that patient can be discharged to skilled nursing facility tomorrow  Management plans discussed with nursing staff   CODE STATUS: full  TOTAL TIME TAKING CARE OF THIS PATIENT: .   Physical therapy consult is recommending skilled nursing facility upon discharge.  POSSIBLE D/C tomorrow DEPENDING ON CLINICAL CONDITION.   Jaielle Dlouhy M.D on 06/26/2018 at 12:17 PM  Between 7am to 6pm - Pager - [phone] After 6pm go to [email or web address] - password [email or web address]  [facility] [place] [facility]  Office  [phone]  CC: Primary care physician; [facility]  Note: This dictation was prepared with Dragon dictation along with smaller phrase technology. Any transcriptional errors that result from this process are unintentional.

## 2018-06-26 NOTE — Progress Notes (Signed)
O2 sats on 2L [place] is 100%.  Sats are 97 - 98% on room air., yet shortly after I turn off the oxygen he starts c/o "not being able to breath".  Oxygen turned back on.

## 2018-06-26 NOTE — Clinical Social Work Note (Signed)
Clinical Social Work Assessment  Patient Details  Name: Bobby Krueger MRN: [id number] Date of Birth: 12/19/1926  Date of referral:  06/26/18               Reason for consult:  Facility Placement                Permission sought to share information with:  Family Supports, Magazine features editor Permission granted to share information::  Yes, Verbal Permission Granted  Name::     Cayden, [place] Daughter [phone]  [phone]   Agency::  SNF admissions  Relationship::     Contact Information:     Housing/Transportation Living arrangements for the past 2 months:  Single Family Home, Skilled Nursing Facility Source of Information:  Adult Children Patient Interpreter Needed:  None Criminal Activity/Legal Involvement Pertinent to Current Situation/Hospitalization:  No - Comment as needed Significant Relationships:  Adult Children Lives with:  Adult Children Do you feel safe going back to the place where you live?  No Need for family participation in patient care:  Yes (Comment)  Care giving concerns:  Patient's daughter feels he needs rehab at SNF again.   Social Worker assessment / plan:  Patient is an 84 year old male who is alert and oriented x1.  Patient was just recently discharged from [facility] about a week ago, per daughter.  Patient has some dementia, and is confused, patient's daughter states that he lives with her, and she was just admitted to the hospital herself.  Patient's daughter would like him to go to a different SNF.  CSW informed her because of his recent SNF stay he will have used up part of his days, patient's daughter expressed understanding, and gave CSW permission to begin bed search in [place].  Employment status:  Retired Health and safety inspector:  Medicare PT Recommendations:  Skilled Nursing Facility Information / Referral to community resources:  Skilled Nursing Facility  Patient/Family's Response to care:  Patient's daughter is  agreeable to SNF placement.  Patient/Family's Understanding of and Emotional Response to Diagnosis, Current Treatment, and Prognosis:  Patient's daughter is hopeful that he will received some therapy and will be able to return back home.  Emotional Assessment Appearance:  Appears stated age Attitude/Demeanor/Rapport:    Affect (typically observed):  Appropriate, Stable Orientation:  Oriented to Self Alcohol / Substance use:  Not Applicable Psych involvement (Current and /or in the community):  No (Comment)  Discharge Needs  Concerns to be addressed:  Care Coordination Readmission within the last 30 days:  No Current discharge risk:  Lack of support system Barriers to Discharge:  Continued Medical Work up   [place] Constable 06/26/2018, 4:52 PM

## 2018-06-26 NOTE — Consult Note (Signed)
  PHARMACY CONSULT NOTE - FOLLOW UP  Pharmacy Consult for Electrolyte Monitoring and Replacement   Recent Labs: Potassium (mmol/L)  Date Value  06/26/2018 3.5  06/06/2013 3.5   Magnesium (mg/dL)  Date Value  02/77/4128 2.0   Calcium (mg/dL)  Date Value  78/67/6720 8.6 (L)   Calcium, Total (mg/dL)  Date Value  94/70/9628 8.5   Albumin (g/dL)  Date Value  36/62/9476 3.2 (L)  06/05/2013 3.5   Phosphorus (mg/dL)  Date Value  54/65/0354 4.3  ]   Assessment: K 4.0, Mag 2.0  Goal of Therapy:  Electrolytes WNL's  Plan:  Slight decrease in K but still within therapeutic range. Will order KCl PO x 1 dose.  Recheck in AM  Clovia Cuff, PharmD, BCPS 06/26/2018 9:38 AM

## 2018-06-26 NOTE — Progress Notes (Signed)
Pt is being combative and verbally abusive to staff.  Pt refusing to take any medication.  Pt thinks staff is trying to hurt him and take his money.

## 2018-06-26 NOTE — [place] FL2 (Signed)
[place] MEDICAID FL2 LEVEL OF CARE SCREENING TOOL     IDENTIFICATION  Patient Name: Bobby Krueger Birthdate: Nov 15, 1926 Sex: male Admission Date (Current Location): 06/23/2018  [place] and [place] Number:  Chiropodist and Address:  [facility], [address] [place], [place] [address]      Provider Number: [id number]  Attending Physician Name and Address:  Adrian Saran, MD  Relative Name and Phone Number:  Ayden, Hardwick Daughter [phone]  [phone]     Current Level of Care: Hospital Recommended Level of Care: Skilled Nursing Facility Prior Approval Number:    Date Approved/Denied:   PASRR Number: [id number]  Discharge Plan: SNF    Current Diagnoses: Patient Active Problem List   Diagnosis Date Noted  . Acute encephalopathy 06/23/2018  . Weakness 06/10/2016  . Cerebral thrombosis with cerebral infarction 03/23/2016  . Acute ischemic stroke (HCC) 03/23/2016  . Acute CVA (cerebrovascular accident) (HCC) 03/23/2016  . Pain in the chest 03/22/2016  . Dizziness 03/22/2016  . Abdominal pain 03/22/2016  . Essential hypertension 03/22/2016  . Chronic combined systolic (congestive) and diastolic (congestive) heart failure (HCC)   . 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 Bradycardia 02/10/2016  . Atrial fibrillation (HCC) 11/19/2015  . CAD (coronary artery disease) 11/16/2015  . COPD (chronic obstructive pulmonary disease) (HCC) 11/16/2015  . GERD (gastroesophageal reflux disease) 11/16/2015    Orientation RESPIRATION BLADDER Height & Weight     Self  O2(2L) Incontinent Weight: 166 lb 0.1 oz (75.3 kg) Height:  5\' 6"  (167.6 cm)  BEHAVIORAL SYMPTOMS/MOOD NEUROLOGICAL BOWEL NUTRITION STATUS      Continent Diet(Pureed diet)  AMBULATORY STATUS COMMUNICATION OF NEEDS Skin   Limited Assist Verbally Normal                       Personal Care Assistance Level of Assistance   Bathing, Feeding, Dressing Bathing Assistance: Limited assistance Feeding assistance: Limited assistance Dressing Assistance: Limited assistance     Functional Limitations Info  Sight, Hearing, Speech Sight Info: Adequate Hearing Info: Adequate Speech Info: Adequate    SPECIAL CARE FACTORS FREQUENCY  PT (By licensed PT), OT (By licensed OT)     PT Frequency: 5x a week OT Frequency: 5x a week            Contractures      Additional Factors Info  Code Status, Allergies, Insulin Sliding Scale Code Status Info: Full Code Allergies Info: ALDACTONE SPIRONOLACTONE, ASPIRIN, CIPROFLOXACIN, CYCLOBENZAPRINE, LEXAPRO ESCITALOPRAM    Insulin Sliding Scale Info: insulin aspart (novoLOG) injection 0-9 Units 3x a day with meals.       Current Medications (06/26/2018):  This is the current hospital active medication list Current Facility-Administered Medications  Medication Dose Route Frequency Provider Last Rate Last Dose  . 0.9 %  sodium chloride infusion   Intravenous PRN Adrian Saran, MD      . acetaminophen (TYLENOL) tablet 650 mg  650 mg Oral Q6H PRN Cammy Copa, MD       Or  . acetaminophen (TYLENOL) suppository 650 mg  650 mg Rectal Q6H PRN Cammy Copa, MD      . albuterol (PROVENTIL) (2.5 MG/3ML) 0.083% nebulizer solution 2.5 mg  2.5 mg Inhalation Q4H PRN Cammy Copa, MD      . apixaban Everlene Balls) tablet 5 mg  5 mg Oral BID Cammy Copa, MD   5 mg at 06/26/18 0851  . atorvastatin (LIPITOR) tablet 20 mg  20 mg Oral q1800 Cammy Copa, MD   20 mg at 06/25/18 1820  . azithromycin (ZITHROMAX) 500 mg in sodium chloride 0.9 % 250 mL IVPB  500 mg Intravenous Q24H Adrian Saran, MD 250 mL/hr at 06/25/18 1240 500 mg at 06/25/18 1240  . bisacodyl (DULCOLAX) EC tablet 5 mg  5 mg Oral Daily PRN Cammy Copa, MD      . brimonidine (ALPHAGAN) 0.2 % ophthalmic solution 1 drop  1 drop Both Eyes Daily Cammy Copa, MD   1 drop at 06/26/18 0855  . carvedilol (COREG) tablet 6.25 mg  6.25  mg Oral BID WC Cammy Copa, MD   6.25 mg at 06/26/18 0852  . cefTRIAXone (ROCEPHIN) 1 g in sodium chloride 0.9 % 100 mL IVPB  1 g Intravenous Q24H Cammy Copa, MD 200 mL/hr at 06/26/18 0141 1 g at 06/26/18 0141  . docusate sodium (COLACE) capsule 100 mg  100 mg Oral BID Cammy Copa, MD   100 mg at 06/26/18 0851  . furosemide (LASIX) injection 40 mg  40 mg Intravenous Daily Mody, Sital, MD      . gabapentin (NEURONTIN) capsule 300 mg  300 mg Oral QHS Cammy Copa, MD   300 mg at 06/25/18 2116  . haloperidol lactate (HALDOL) injection 0.5 mg  0.5 mg Intravenous Q6H PRN Adrian Saran, MD   0.5 mg at 06/25/18 2218  . HYDROcodone-acetaminophen (NORCO/VICODIN) 5-325 MG per tablet 1-2 tablet  1-2 tablet Oral Q4H PRN Cammy Copa, MD   2 tablet at 06/25/18 1530  . insulin aspart (novoLOG) injection 0-5 Units  0-5 Units Subcutaneous QHS Cammy Copa, MD   4 Units at 06/25/18 2117  . insulin aspart (novoLOG) injection 0-9 Units  0-9 Units Subcutaneous TID WC Cammy Copa, MD   1 Units at 06/26/18 0850  . ipratropium-albuterol (DUONEB) 0.5-2.5 (3) MG/3ML nebulizer solution 3 mL  3 mL Nebulization Q6H PRN Mody, Sital, MD      . latanoprost (XALATAN) 0.005 % ophthalmic solution 1 drop  1 drop Both Eyes QHS Cammy Copa, MD   1 drop at 06/25/18 2115  . losartan (COZAAR) tablet 100 mg  100 mg Oral Daily Cammy Copa, MD   100 mg at 06/26/18 1610  . ondansetron (ZOFRAN) tablet 4 mg  4 mg Oral Q6H PRN Cammy Copa, MD       Or  . ondansetron [facility]) injection 4 mg  4 mg Intravenous Q6H PRN Cammy Copa, MD      . pantoprazole (PROTONIX) EC tablet 40 mg  40 mg Oral Daily Cammy Copa, MD   40 mg at 06/24/18 0909  . polyvinyl alcohol (LIQUIFILM TEARS) 1.4 % ophthalmic solution 1 drop  1 drop Both Eyes PRN Cammy Copa, MD      . potassium chloride SA (K-DUR,KLOR-CON) CR tablet 40 mEq  40 mEq Oral Once Foye Deer, RPH      . sodium chloride flush (NS) 0.9 % injection 3 mL  3 mL Intravenous Q12H  Adrian Saran, MD   3 mL at 06/26/18 0901  . terazosin (HYTRIN) capsule 5 mg  5 mg Oral QPM Cammy Copa, MD   5 mg at 06/25/18 1820  . tiotropium (SPIRIVA) inhalation capsule ([facility] use ONLY) 18 mcg  18 mcg Inhalation Daily Cammy Copa, MD   18 mcg at 06/26/18 0853  . traZODone (DESYREL) tablet 25 mg  25 mg Oral QHS PRN Cammy Copa, MD   25 mg at 06/25/18 2116     Discharge Medications:  Please see discharge summary for a list of discharge medications.  Relevant Imaging Results:  Relevant Lab Results:   Additional Information SSN:  [id number]  [place]

## 2018-06-27 DIAGNOSIS — G9340 Encephalopathy, unspecified: Secondary | ICD-10-CM

## 2018-06-27 DIAGNOSIS — F0391 Unspecified dementia with behavioral disturbance: Secondary | ICD-10-CM

## 2018-06-27 DIAGNOSIS — F03918 Unspecified dementia, unspecified severity, with other behavioral disturbance: Secondary | ICD-10-CM

## 2018-06-27 LAB — BASIC METABOLIC PANEL
Anion gap: 10 (ref 5–15)
BUN: 31 mg/dL — AB (ref 8–23)
CO2: 29 mmol/L (ref 22–32)
CREATININE: 1.23 mg/dL (ref 0.61–1.24)
Calcium: 8.7 mg/dL — ABNORMAL LOW (ref 8.9–10.3)
Chloride: 101 mmol/L (ref 98–111)
GFR calc Af Amer: 60 mL/min — ABNORMAL LOW (ref >60)
GFR calc non Af Amer: 52 mL/min — ABNORMAL LOW (ref >60)
Glucose, Bld: 141 mg/dL — ABNORMAL HIGH (ref 70–99)
Potassium: 4.7 mmol/L (ref 3.5–5.1)
Sodium: 140 mmol/L (ref 135–145)

## 2018-06-27 LAB — GLUCOSE, CAPILLARY
Glucose-Capillary: 130 mg/dL — ABNORMAL HIGH (ref 70–99)
Glucose-Capillary: 178 mg/dL — ABNORMAL HIGH (ref 70–99)
Glucose-Capillary: 122 mg/dL — ABNORMAL HIGH (ref 70–99)
Glucose-Capillary: 198 mg/dL — ABNORMAL HIGH (ref 70–99)

## 2018-06-27 LAB — MAGNESIUM: Magnesium: 2 mg/dL (ref 1.7–2.4)

## 2018-06-27 MED ORDER — RISPERIDONE 1 MG PO TBDP
1.0000 mg | ORAL_TABLET | Freq: Every evening | ORAL | Status: DC
Start: 2018-06-27 — End: 2018-06-28
  Administered 2018-06-27: 1 mg via ORAL
  Filled 2018-06-27 (×2): qty 1

## 2018-06-27 MED ORDER — RISPERIDONE 1 MG PO TBDP
1.0000 mg | ORAL_TABLET | Freq: Every day | ORAL | Status: DC
  Filled 2018-06-27: qty 1

## 2018-06-27 MED ORDER — FUROSEMIDE 40 MG PO TABS
40.0000 mg | ORAL_TABLET | Freq: Every day | ORAL | Status: DC
  Administered 2018-06-28: 40 mg via ORAL
  Filled 2018-06-27: qty 1

## 2018-06-27 NOTE — Progress Notes (Signed)
[facility] - [place] at [facility]   PATIENT NAME: Bobby Krueger    MR#:  [id number]  DATE OF BIRTH:  25-Nov-1926  SUBJECTIVE:  Patient was agitated last night and now has a sitter.   REVIEW OF SYSTEMS:    Patient with dementia   Tolerating Diet: yes      DRUG ALLERGIES:   Allergies  Allergen Reactions  . Aldactone [Spironolactone] Hives  . Aspirin Hives  . Ciprofloxacin Hives  . Cyclobenzaprine Hives  . Lexapro [Escitalopram] Hives    VITALS:  Blood pressure 122/70, pulse 81, temperature (!) 97.4 F (36.3 C), temperature source Oral, resp. rate 18, height 5\' 6"  (1.676 m), weight 77.7 kg, SpO2 90 %.  PHYSICAL EXAMINATION:  Constitutional: Appears well-developed and well-nourished. No distress. HENT: Normocephalic. Marland Kitchen Oropharynx is clear and moist.  Eyes: Conjunctivae are normal.  no scleral icterus.  Neck: No JVD. No tracheal deviation. CVS: Irregular, irregular S1/S2 +, no murmurs, no gallops, no carotid bruit.  Pulmonary: Normal respiratory effort with out rhonchi or crackles   abdominal: Soft. BS +,  no distension, tenderness, rebound or guarding.  Musculoskeletal:. No edema and no tenderness.  Neuro patient awake oriented to name skin: Skin is warm and dry. No rash noted. Psychiatric: Pleasantly demented   LABORATORY PANEL:   CBC Recent Labs  Lab 06/26/18 0349  WBC 5.3  HGB 10.2*  HCT 33.5*  PLT 156   ------------------------------------------------------------------------------------------------------------------  Chemistries  Recent Labs  Lab 06/23/18 1706  06/27/18 0549  NA 142   < > 140  K 2.7*   < > 4.7  CL 99   < > 101  CO2 35*   < > 29  GLUCOSE 228*   < > 141*  BUN 14   < > 31*  CREATININE 0.99   < > 1.23  CALCIUM 8.9   < > 8.7*  MG  --    < > 2.0  AST 23  --   --   ALT 18  --   --   ALKPHOS 100  --   --   BILITOT 0.5  --   --    < > = values in this interval not displayed.    ------------------------------------------------------------------------------------------------------------------  Cardiac Enzymes Recent Labs  Lab 06/23/18 2023  TROPONINI 0.05*   ------------------------------------------------------------------------------------------------------------------  RADIOLOGY:  No results found.   ASSESSMENT AND PLAN:   84 year old male with history of chronic combined systolic and diastolic heart failure ejection fraction 25-30%, atrial fibrillation, diabetes and dementia who presents from home via EMS due to increased confusion.  1.  Acute metabolic encephalopathy in the setting of community-acquired pneumonia, CHF exacerbation and hypokalemia on top of underlying dementia: Patient is at his baseline Neurology consultation appreciated. MRI attempted x3.  Patient unable to tolerate MRI.  Discussed with neurology and we continued MRI.  2.  Acute on chronic combined systolic and diastolic heart failure ejection fraction 25 to 30%: Creatinine is increasing to 1.23 this morning. I will change oral Lasix. monitor intake and output with daily weight  CHF clinic referral upon discharge  3.  Community-acquired pneumonia: Change to OMNICEF and oral azithromycin.   4.  Hypokalemia: Replete PRN   5.  Right lower extremity edema: Doppler was negative for DVT. 6.  PAF/atrial flutter: Continue Coreg for heart rate control Continue Eliquis 7.  BPH: Continue Hytrin  8.  Essential hypertension: Continue Coreg and losartan 9.  Hyponatremia: Resolved  10.  Dementia: Patient has significant sundowning.  I ordered Risperdal for the evening.  Psychiatry consult placed via epic.  Management plans discussed with nursing staff   CODE STATUS: full  TOTAL TIME TAKING CARE OF THIS PATIENT: 24 minutes.   Physical therapy consult is recommending skilled nursing facility upon discharge.  POSSIBLE D/C tomorrow DEPENDING ON need for [place]  Angeles Zehner M.D on  06/27/2018 at 12:23 PM  Between 7am to 6pm - Pager - [phone] After 6pm go to [email or web address] - password [email or web address]  [facility]  Office  [phone]  CC: Primary care physician; [facility]  Note: This dictation was prepared with Dragon dictation along with smaller phrase technology. Any transcriptional errors that result from this process are unintentional.

## 2018-06-27 NOTE — Consult Note (Signed)
  PHARMACY CONSULT NOTE - FOLLOW UP  Pharmacy Consult for Electrolyte Monitoring and Replacement   Recent Labs: Potassium (mmol/L)  Date Value  06/27/2018 4.7  06/06/2013 3.5   Magnesium (mg/dL)  Date Value  53/96/7289 2.0   Calcium (mg/dL)  Date Value  79/15/0413 8.7 (L)   Calcium, Total (mg/dL)  Date Value  64/38/3779 8.5   Albumin (g/dL)  Date Value  39/68/8648 3.2 (L)  06/05/2013 3.5   Phosphorus (mg/dL)  Date Value  47/20/7218 4.3  ]   Assessment: K 4.0, Mag 2.0  Goal of Therapy:  Electrolytes WNL's  Plan:  No supplementation indicated.  Recheck in AM  Clovia Cuff, PharmD, BCPS 06/27/2018 9:29 AM

## 2018-06-27 NOTE — Care Management Important Message (Signed)
Needs initial Medicare IM signed.  Called and left a message with daughter to review.  Daughter has not returned call as of time of note.  Will attempt to reach tomorrow.

## 2018-06-27 NOTE — Clinical Social Work Note (Addendum)
CSW presented bed offers to patient's daughter, and she chose [facility].  CSW attempted to contact [facility], and had to leave a message awaiting for call back.  3:00pm  Bobby Krueger can accept patient once he is without a Comptroller for 24 hours.  Bobby Krueger. Bobby Krueger, MSW, Theresia Majors [phone]  06/27/2018 9:29 AM

## 2018-06-27 NOTE — Consult Note (Signed)
[facility] Face-to-Face Psychiatry Consult   Reason for Consult: Sulfur 84 year old man in the hospital with COPD and acute encephalopathy.  Question about agitation Referring Physician: Mody Patient Identification: Bobby Krueger MRN:  [id number] Principal Diagnosis: Acute encephalopathy Diagnosis:  Principal Problem:   Acute encephalopathy Active Problems:   Dementia with behavioral disturbance (HCC)   Total Time spent with patient: 45 minutes  Subjective:   Bobby Krueger is a 84 y.o. male patient admitted with "I feel bad".  HPI: Patient seen chart reviewed.  Hospitalist asked me to see the patient.  This is an 84 year old man with no specific past psychiatric history that I can identify who is in the hospital with acute confusion and delirium probably related to congestive heart failure and COPD.  Patient is sitting up in a chair when I came in.  Awake and alert.  Interactive but speaks very little.  Does not know where he is right now.  Has no idea what year it is.  No idea why he is here.  He says he feels very bad.  Says he feels "as low as can be".  Denies suicidal thoughts.  Shows no sign of hallucinating.  Complains of some discomfort in his abdomen.  He is not currently agitated or fighting.  Social history: Family appear to be involved not clear where he had been living previously it looks like they are working on finding appropriate placement at discharge  Medical history: Multiple medical problems including congestive heart failure A. fib COPD diabetes  Substance abuse history: None identified  Past Psychiatric History: Patient is listed as being allergic to Lexapro suggesting that he may have been prescribed antidepressants in the past but I do not see depression or other mental health problems listed as a primary issue.  No history of psychiatric hospitalization.  Risk to Self:   Risk to Others:   Prior Inpatient Therapy:   Prior Outpatient Therapy:    Past Medical  History:  Past Medical History:  Diagnosis Date  . A-fib (HCC)   . 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 03/2014: EF 35%, normal nuc in 05/2014 b. echo 11/2015: EF 40-45% w/ diffuse HK and Grade 1 DD  . COPD (chronic obstructive pulmonary disease) (HCC)   . Diabetes mellitus without complication (HCC)   . High cholesterol   . Hypertension     Past Surgical History:  Procedure Laterality Date  . BACK SURGERY    . CARPAL TUNNEL RELEASE    . CHOLECYSTECTOMY     Family History:  Family History  Problem Relation Age of Onset  . Prostate cancer Unknown    Family Psychiatric  History: None known Social History:  Social History   Substance and Sexual Activity  Alcohol Use No     Social History   Substance and Sexual Activity  Drug Use No    Social History   Socioeconomic History  . Marital status: Widowed    Spouse name: Not on file  . Number of children: Not on file  . Years of education: Not on file  . Highest education level: Not on file  Occupational History  . Occupation: retired  Engineer, production  . Financial resource strain: Not on file  . Food insecurity:    Worry: Not on file    Inability: Not on file  . Transportation needs:    Medical: Not on file    Non-medical: Not on file  Tobacco Use  .  Smoking status: Former Games developer  . Smokeless tobacco: Never Used  Substance and Sexual Activity  . Alcohol use: No  . Drug use: No  . Sexual activity: Not on file  Lifestyle  . Physical activity:    Days per week: Not on file    Minutes per session: Not on file  . Stress: Not on file  Relationships  . Social connections:    Talks on phone: Not on file    Gets together: Not on file    Attends religious service: Not on file    Active member of club or organization: Not on file    Attends meetings of clubs or organizations: Not on file    Relationship status: Not on file  Other Topics Concern  . Not on file   Social History Narrative  . Not on file   Additional Social History:    Allergies:   Allergies  Allergen Reactions  . Aldactone [Spironolactone] Hives  . Aspirin Hives  . Ciprofloxacin Hives  . Cyclobenzaprine Hives  . Lexapro [Escitalopram] Hives    Labs:  Results for orders placed or performed during the hospital encounter of 06/23/18 (from the past 48 hour(s))  Glucose, capillary     Status: Abnormal   Collection Time: 06/25/18  8:49 PM  Result Value Ref Range   Glucose-Capillary 306 (H) 70 - 99 mg/dL   Comment 1 Notify RN   Basic metabolic panel     Status: Abnormal   Collection Time: 06/26/18  3:49 AM  Result Value Ref Range   Sodium 142 135 - 145 mmol/L   Potassium 3.5 3.5 - 5.1 mmol/L   Chloride 101 98 - 111 mmol/L   CO2 34 (H) 22 - 32 mmol/L   Glucose, Bld 103 (H) 70 - 99 mg/dL   BUN 24 (H) 8 - 23 mg/dL   Creatinine, Ser 1.61 0.61 - 1.24 mg/dL   Calcium 8.6 (L) 8.9 - 10.3 mg/dL   GFR calc non Af Amer >60 >60 mL/min   GFR calc Af Amer >60 >60 mL/min   Anion gap 7 5 - 15    Comment: Performed at [facility], [address] [place], [place] [address]  Magnesium     Status: None   Collection Time: 06/26/18  3:49 AM  Result Value Ref Range   Magnesium 2.0 1.7 - 2.4 mg/dL    Comment: Performed at [facility], [address] [place], [place] [address]  CBC     Status: Abnormal   Collection Time: 06/26/18  3:49 AM  Result Value Ref Range   WBC 5.3 4.0 - 10.5 K/uL   RBC 3.73 (L) 4.22 - 5.81 MIL/uL   Hemoglobin 10.2 (L) 13.0 - 17.0 g/dL   HCT 11.9 (L) 14.7 - 82.9 %   MCV 89.8 80.0 - 100.0 fL   MCH 27.3 26.0 - 34.0 pg   MCHC 30.4 30.0 - 36.0 g/dL   RDW 56.2 13.0 - 86.5 %   Platelets 156 150 - 400 K/uL   nRBC 0.0 0.0 - 0.2 %    Comment: Performed at [facility], [address] [place], [place] [address]  Glucose, capillary     Status: Abnormal   Collection Time: 06/26/18  7:56 AM  Result Value Ref Range   Glucose-Capillary  148 (H) 70 - 99 mg/dL  Glucose, capillary     Status: Abnormal   Collection Time: 06/26/18 12:48 PM  Result Value Ref Range   Glucose-Capillary 225 (  H) 70 - 99 mg/dL  Glucose, capillary     Status: None   Collection Time: 06/26/18  4:47 PM  Result Value Ref Range   Glucose-Capillary 87 70 - 99 mg/dL  Glucose, capillary     Status: Abnormal   Collection Time: 06/26/18  9:14 PM  Result Value Ref Range   Glucose-Capillary 144 (H) 70 - 99 mg/dL   Comment 1 Notify RN   Basic metabolic panel     Status: Abnormal   Collection Time: 06/27/18  5:49 AM  Result Value Ref Range   Sodium 140 135 - 145 mmol/L   Potassium 4.7 3.5 - 5.1 mmol/L   Chloride 101 98 - 111 mmol/L   CO2 29 22 - 32 mmol/L   Glucose, Bld 141 (H) 70 - 99 mg/dL   BUN 31 (H) 8 - 23 mg/dL   Creatinine, Ser 1.61 0.61 - 1.24 mg/dL   Calcium 8.7 (L) 8.9 - 10.3 mg/dL   GFR calc non Af Amer 52 (L) >60 mL/min   GFR calc Af Amer 60 (L) >60 mL/min   Anion gap 10 5 - 15    Comment: Performed at [facility], [address], [place], [place] [address]  Magnesium     Status: None   Collection Time: 06/27/18  5:49 AM  Result Value Ref Range   Magnesium 2.0 1.7 - 2.4 mg/dL    Comment: Performed at [facility], [address] [place], [place] [address]  Glucose, capillary     Status: Abnormal   Collection Time: 06/27/18  8:07 AM  Result Value Ref Range   Glucose-Capillary 130 (H) 70 - 99 mg/dL  Glucose, capillary     Status: Abnormal   Collection Time: 06/27/18 11:54 AM  Result Value Ref Range   Glucose-Capillary 178 (H) 70 - 99 mg/dL    Current Facility-Administered Medications  Medication Dose Route Frequency Provider Last Rate Last Dose  . 0.9 %  sodium chloride infusion   Intravenous PRN Adrian Saran, MD   Stopped at 06/26/18 1235  . acetaminophen (TYLENOL) tablet 650 mg  650 mg Oral Q6H PRN Cammy Copa, MD       Or  . acetaminophen (TYLENOL) suppository 650 mg  650 mg Rectal Q6H PRN Cammy Copa,  MD      . albuterol (PROVENTIL) (2.5 MG/3ML) 0.083% nebulizer solution 2.5 mg  2.5 mg Inhalation Q4H PRN Cammy Copa, MD      . apixaban Everlene Balls) tablet 5 mg  5 mg Oral BID Cammy Copa, MD   5 mg at 06/27/18 0907  . atorvastatin (LIPITOR) tablet 20 mg  20 mg Oral q1800 Cammy Copa, MD   20 mg at 06/26/18 1720  . azithromycin (ZITHROMAX) tablet 500 mg  500 mg Oral Daily Adrian Saran, MD   500 mg at 06/27/18 0906  . bisacodyl (DULCOLAX) EC tablet 5 mg  5 mg Oral Daily PRN Cammy Copa, MD      . brimonidine (ALPHAGAN) 0.2 % ophthalmic solution 1 drop  1 drop Both Eyes Daily Cammy Copa, MD   1 drop at 06/27/18 0916  . carvedilol (COREG) tablet 6.25 mg  6.25 mg Oral BID WC Cammy Copa, MD   6.25 mg at 06/27/18 0907  . cefdinir (OMNICEF) capsule 300 mg  300 mg Oral Q12H Adrian Saran, MD   300 mg at 06/27/18 0907  . docusate sodium (COLACE) capsule 100 mg  100 mg Oral BID Cammy Copa, MD   100 mg at  06/26/18 0851  . furosemide (LASIX) tablet 40 mg  40 mg Oral Daily Mody, Sital, MD      . gabapentin (NEURONTIN) capsule 300 mg  300 mg Oral QHS Cammy Copa, MD   300 mg at 06/25/18 2116  . haloperidol lactate (HALDOL) injection 0.5 mg  0.5 mg Intravenous Q6H PRN Adrian Saran, MD   0.5 mg at 06/27/18 0241  . HYDROcodone-acetaminophen (NORCO/VICODIN) 5-325 MG per tablet 1-2 tablet  1-2 tablet Oral Q4H PRN Cammy Copa, MD   2 tablet at 06/25/18 1530  . insulin aspart (novoLOG) injection 0-5 Units  0-5 Units Subcutaneous QHS Cammy Copa, MD   4 Units at 06/25/18 2117  . insulin aspart (novoLOG) injection 0-9 Units  0-9 Units Subcutaneous TID WC Cammy Copa, MD   2 Units at 06/27/18 1223  . ipratropium-albuterol (DUONEB) 0.5-2.5 (3) MG/3ML nebulizer solution 3 mL  3 mL Nebulization Q6H PRN Adrian Saran, MD   3 mL at 06/26/18 1057  . latanoprost (XALATAN) 0.005 % ophthalmic solution 1 drop  1 drop Both Eyes QHS Cammy Copa, MD   1 drop at 06/25/18 2115  . losartan (COZAAR) tablet 100 mg  100  mg Oral Daily Cammy Copa, MD   100 mg at 06/27/18 0906  . ondansetron (ZOFRAN) tablet 4 mg  4 mg Oral Q6H PRN Cammy Copa, MD       Or  . ondansetron [facility]) injection 4 mg  4 mg Intravenous Q6H PRN Cammy Copa, MD      . pantoprazole (PROTONIX) EC tablet 40 mg  40 mg Oral Daily Cammy Copa, MD   40 mg at 06/27/18 0906  . polyvinyl alcohol (LIQUIFILM TEARS) 1.4 % ophthalmic solution 1 drop  1 drop Both Eyes PRN Cammy Copa, MD      . risperiDONE (RISPERDAL M-TABS) disintegrating tablet 1 mg  1 mg Oral QPM Mody, Sital, MD      . sodium chloride flush (NS) 0.9 % injection 3 mL  3 mL Intravenous Q12H Adrian Saran, MD   3 mL at 06/27/18 0909  . terazosin (HYTRIN) capsule 5 mg  5 mg Oral QPM Cammy Copa, MD   5 mg at 06/26/18 1720  . tiotropium (SPIRIVA) inhalation capsule ([facility] use ONLY) 18 mcg  18 mcg Inhalation Daily Cammy Copa, MD   18 mcg at 06/27/18 0908  . traZODone (DESYREL) tablet 25 mg  25 mg Oral QHS PRN Cammy Copa, MD   25 mg at 06/25/18 2116    Musculoskeletal: Strength & Muscle Tone: decreased Gait & Station: unsteady Patient leans: N/A  Psychiatric Specialty Exam: Physical Exam  Nursing note and vitals reviewed. Constitutional: He appears well-developed and well-nourished.  HENT:  Head: Normocephalic and atraumatic.  Eyes: Pupils are equal, round, and reactive to light. Conjunctivae are normal.  Neck: Normal range of motion.  Cardiovascular: Regular rhythm and normal heart sounds.  Respiratory: Effort normal. No respiratory distress.  GI: Soft.  Musculoskeletal: Normal range of motion.  Neurological: He is alert.  Skin: Skin is warm and dry.  Psychiatric: His affect is blunt. His speech is delayed. He is slowed. Cognition and memory are impaired. He expresses no suicidal ideation. He exhibits abnormal recent memory and abnormal remote memory.    Review of Systems  Constitutional: Negative.   HENT: Negative.   Eyes: Negative.   Respiratory: Negative.    Cardiovascular: Negative.   Gastrointestinal: Positive for abdominal pain.  Musculoskeletal: Negative.   Skin: Negative.   Neurological: Negative.   Psychiatric/Behavioral:  Positive for depression. Negative for hallucinations, substance abuse and suicidal ideas. The patient is not nervous/anxious.     Blood pressure 112/72, pulse 63, temperature 98.3 F (36.8 C), resp. rate 18, height 5\' 6"  (1.676 m), weight 77.7 kg, SpO2 100 %.Body mass index is 27.65 kg/m.  General Appearance: Casual  Eye Contact:  Minimal  Speech:  Slow  Volume:  Decreased  Mood:  Dysphoric  Affect:  Congruent  Thought Process:  Coherent  Orientation:  Negative  Thought Content:  Negative  Suicidal Thoughts:  No  Homicidal Thoughts:  No  Memory:  Negative  Judgement:  Negative  Insight:  Negative  Psychomotor Activity:  Negative  Concentration:  Concentration: Negative  Recall:  Negative  Fund of Knowledge:  Negative  Language:  Negative  Akathisia:  Negative  Handed:  Right  AIMS (if indicated):     Assets:  Desire for Improvement  ADL's:  Impaired  Cognition:  Impaired,  Moderate  Sleep:        Treatment Plan Summary: Daily contact with patient to assess and evaluate symptoms and progress in treatment, Medication management and Plan Hospitalist had gone ahead and ordered Risperdal 1 mg in the evening.  This seems like a reasonable intervention.  Patient was apparently very agitated and "sundowning" last night requiring antipsychotics.  Proactive mild antipsychotics can help to minimize that.  I have some concern about whether he may be depressed but with the degree of dementia and medical problems is a little difficult to tell.  This is something that should probably be kept in mind longer term but at this point I am not going to add any other antidepressant.  We will follow as needed.  Disposition: No evidence of imminent risk to self or others at present.   Patient does not meet criteria for  psychiatric inpatient admission. Supportive therapy provided about ongoing stressors.  Mordecai Rasmussen, MD 06/27/2018 4:45 PM

## 2018-06-28 LAB — BASIC METABOLIC PANEL
Anion gap: 10 (ref 5–15)
BUN: 37 mg/dL — ABNORMAL HIGH (ref 8–23)
CO2: 28 mmol/L (ref 22–32)
CREATININE: 1.51 mg/dL — AB (ref 0.61–1.24)
Calcium: 8.8 mg/dL — ABNORMAL LOW (ref 8.9–10.3)
Chloride: 104 mmol/L (ref 98–111)
GFR calc Af Amer: 47 mL/min — ABNORMAL LOW (ref >60)
GFR calc non Af Amer: 40 mL/min — ABNORMAL LOW (ref >60)
Glucose, Bld: 147 mg/dL — ABNORMAL HIGH (ref 70–99)
Potassium: 4.2 mmol/L (ref 3.5–5.1)
SODIUM: 142 mmol/L (ref 135–145)

## 2018-06-28 LAB — GLUCOSE, CAPILLARY
Glucose-Capillary: 130 mg/dL — ABNORMAL HIGH (ref 70–99)
Glucose-Capillary: 85 mg/dL (ref 70–99)
Glucose-Capillary: 110 mg/dL — ABNORMAL HIGH (ref 70–99)
Glucose-Capillary: 184 mg/dL — ABNORMAL HIGH (ref 70–99)

## 2018-06-28 MED ORDER — RISPERIDONE 1 MG PO TBDP
1.0000 mg | ORAL_TABLET | Freq: Two times a day (BID) | ORAL | Status: DC
  Administered 2018-06-28 – 2018-06-29 (×2): 1 mg via ORAL
  Filled 2018-06-28 (×3): qty 1

## 2018-06-28 MED ORDER — QUETIAPINE FUMARATE 25 MG PO TABS
25.0000 mg | ORAL_TABLET | Freq: Every evening | ORAL | Status: DC | PRN
  Administered 2018-06-28: 25 mg via ORAL
  Filled 2018-06-28: qty 1

## 2018-06-28 NOTE — Care Management (Signed)
Barrier to discharge-Patient has required a Comptroller. SNF will not accept if he has had a sitter within 24hrs.  Will go to [facility]

## 2018-06-28 NOTE — Clinical Social Work Note (Signed)
Patient's [facility] number has been received it is [phone] [id number].  Plan is for patient to discharge to [facility] once he has been sitter free for 24 hours.  Plan to discharge tomorrow if patient is medically ready for discharge and orders have been received.  Bobby Krueger. Hassan Rowan, MSW, Theresia Majors [phone]  06/28/2018 6:17 PM

## 2018-06-28 NOTE — Progress Notes (Signed)
[facility] [place] [facility]   PATIENT NAME: Bobby Krueger    MR#:  [id number]  DATE OF BIRTH:  04/19/1927  SUBJECTIVE:  Patient has tele sitter for agitation   REVIEW OF SYSTEMS:    Patient with dementia   Tolerating Diet: yes      DRUG ALLERGIES:   Allergies  Allergen Reactions  . Aldactone [Spironolactone] Hives  . Aspirin Hives  . Ciprofloxacin Hives  . Cyclobenzaprine Hives  . Lexapro [Escitalopram] Hives    VITALS:  Blood pressure (!) 156/82, pulse 89, temperature 97.7 F (36.5 C), temperature source Oral, resp. rate 18, height  (1.676 m), weight 77.7 kg, SpO2 99 %.  PHYSICAL EXAMINATION:  Constitutional: Appears well-developed and well-nourished. No distress. HENT: Normocephalic. Marland Kitchen Oropharynx is clear and moist.  Eyes: Conjunctivae are normal.  no scleral icterus.  Neck: No JVD. No tracheal deviation. CVS: Irregular, irregular S1/S2 +, no murmurs, no gallops, no carotid bruit.  Pulmonary: Normal respiratory effort with out rhonchi or crackles   abdominal: Soft. BS +,  no distension, tenderness, rebound or guarding.  Musculoskeletal:. No edema and no tenderness.  Neuro patient awake oriented to name skin: Skin is warm and dry. No rash noted. Psychiatric: Pleasantly demented   LABORATORY PANEL:   CBC Recent Labs  Lab 06/26/18 0349  WBC 5.3  HGB 10.2*  HCT 33.5*  PLT 156   ------------------------------------------------------------------------------------------------------------------  Chemistries  Recent Labs  Lab 06/23/18 1706  06/27/18 0549 06/28/18 0615  NA 142   < > 140 142  K 2.7*   < > 4.7 4.2  CL 99   < > 101 104  CO2 35*   < > 29 28  GLUCOSE 228*   < > 141* 147*  BUN 14   < > 31* 37*  CREATININE 0.99   < > 1.23 1.51*  CALCIUM 8.9   < > 8.7* 8.8*  MG  --    < > 2.0  --   AST 23  --   --   --   ALT 18  --   --   --   ALKPHOS 100  --   --   --   BILITOT 0.5  --   --   --    < > = values in this  interval not displayed.   ------------------------------------------------------------------------------------------------------------------  Cardiac Enzymes Recent Labs  Lab 06/23/18 2023  TROPONINI 0.05*   ------------------------------------------------------------------------------------------------------------------  RADIOLOGY:  No results found.   ASSESSMENT AND PLAN:   84 year old male with history of chronic combined systolic and diastolic heart failure ejection fraction 25-30%, atrial fibrillation, diabetes and dementia who presents from home via EMS due to increased confusion.  1.  Acute metabolic encephalopathy in the setting of community-acquired pneumonia, CHF exacerbation and hypokalemia on top of underlying dementia: Patient is at his baseline Neurology consultation appreciated. MRI attempted x3.  Patient unable to tolerate MRI.  Discussed with neurology and we continued MRI.  2.  Acute on chronic combined systolic and diastolic heart failure ejection fraction 25 to 30%: Creatinine has increased  I will stop lasix for today. monitor intake and output with daily weight  CHF clinic referral upon discharge  3.  Community-acquired pneumonia: Changed to OMNICEF and oral azithromycin. Can stop antibiotics tomorrow.  4.  Hypokalemia: Replete PRN   5.  Right lower extremity edema: Doppler was negative for DVT. 6.  PAF/atrial flutter: Continue Coreg for heart rate control Continue Eliquis 7.  BPH: Continue Hytrin  8.  Essential hypertension: Continue Coreg and losartan 9.  Hyponatremia: Resolved  10.  Dementia: Patient has significant sundowning.  I increased dose of Risperdal so we can try to DC telemetry.  Appreciate psychiatry evaluation.   Management plans discussed with nursing staff   CODE STATUS: full  TOTAL TIME TAKING CARE OF THIS PATIENT: 24 minutes.   Physical therapy consult is recommending skilled nursing facility upon discharge.  POSSIBLE  D/C tomorrow DEPENDING ON need for Sitter  Esmee Fallaw M.D on 06/28/2018 at 12:11 PM  Between 7am to 6pm - Pager - [phone] After 6pm go to [email or web address] - password [email or web address]  [facility]  Office  [phone]  CC: Primary care physician; [facility]  Note: This dictation was prepared with Dragon dictation along with smaller phrase technology. Any transcriptional errors that result from this process are unintentional.

## 2018-06-28 NOTE — Care Management Important Message (Signed)
Daughter, Lawanna Kobus, returned call.  Aware of Medicare IM right and in agreement with discharge plan.  Verbal consent given to sign initial IM.  Copy left in patient's room for reference.

## 2018-06-28 NOTE — Progress Notes (Addendum)
Subjective: Patient is drowsy. He received a dose of Haldol at about 02:41 am for agitation. Patient also received Risperdal and Trazodone last night.  Objective: Current vital signs: BP (!) 156/82 (BP Location: Right Arm)   Pulse 89   Temp 97.7 F (36.5 C) (Oral)   Resp 18   Ht 5\' 6"  (1.676 m)   Wt 77.7 kg   SpO2 99%   BMI 27.65 kg/m  Vital signs in last 24 hours: Temp:  [97.7 F (36.5 C)-98.3 F (36.8 C)] 97.7 F (36.5 C) (12/11 0649) Pulse Rate:  [63-91] 89 (12/11 0649) Resp:  [18] 18 (12/11 0649) BP: (112-156)/(72-99) 156/82 (12/11 0649) SpO2:  [90 %-100 %] 99 % (12/11 0649)  Intake/Output from previous day: 12/10 0701 - 12/11 0700 In: 230 [P.O.:230] Out: 150 [Urine:150] Intake/Output this shift: No intake/output data recorded. Nutritional status:  Diet Order            DIET - DYS 1 Room service appropriate? Yes with Assist; Fluid consistency: Thin  Diet effective now             Neurologic Exam:  Mental Status: Drowsy. Does not open eyes to sternal rub but mumbles incoherently. Unable to follow commands.  Cranial Nerves: II: Discs flat bilaterally; unable to test visual field since he does not open his eyes, pupils equal, round, reactive to light and accommodation III,IV, VI: ptosis not present, extra-ocular motions intact bilaterally V,VII: unable to assess VIII: hearing normal bilaterally IX,X: gag reflex present XI: Unable to assess XII: Unable to assess Motor: Moves bilateral upper and lower extremities spontaneously and purposefully. Tone and bulk:normal tone throughout; no atrophy noted Sensory: Pinprick and light touchintact bilaterally to noxious stimuli Deep Tendon Reflexes: 2+ and symmetric throughout Plantars: Right:muteLeft: mute Cerebellar: Unable to assess Gait: not tested due to safety concerns  Lab Results: Basic Metabolic Panel: Recent Labs  Lab 06/24/18 0442 06/24/18 0917 06/24/18 1547  06/25/18 0443 06/26/18 0349 06/27/18 0549 06/28/18 0615  NA 145  --   --  149* 142 140 142  K 2.5*  --  3.4* 4.0 3.5 4.7 4.2  CL 101  --   --  105 101 101 104  CO2 35*  --   --  36* 34* 29 28  GLUCOSE 186*  --   --  166* 103* 141* 147*  BUN 15  --   --  18 24* 31* 37*  CREATININE 1.05  --   --  1.10 1.00 1.23 1.51*  CALCIUM 8.7*  --   --  9.0 8.6* 8.7* 8.8*  MG  --  1.7 2.2 2.0 2.0 2.0  --     Liver Function Tests: Recent Labs  Lab 06/23/18 1706  AST 23  ALT 18  ALKPHOS 100  BILITOT 0.5  PROT 7.0  ALBUMIN 3.2*   No results for input(s): LIPASE, AMYLASE in the last 168 hours. No results for input(s): AMMONIA in the last 168 hours.  CBC: Recent Labs  Lab 06/23/18 1706 06/24/18 0442 06/25/18 0443 06/26/18 0349  WBC 5.9 6.7 5.1 5.3  HGB 11.0* 9.9* 10.7* 10.2*  HCT 35.6* 33.4* 35.5* 33.5*  MCV 89.7 90.5 90.6 89.8  PLT 159 150 141* 156    Cardiac Enzymes: Recent Labs  Lab 06/23/18 2023  TROPONINI 0.05*    Lipid Panel: No results for input(s): CHOL, TRIG, HDL, CHOLHDL, VLDL, LDLCALC in the last 168 hours.  CBG: Recent Labs  Lab 06/27/18 0807 06/27/18 1154 06/27/18 1703 06/27/18 2039 06/28/18  0803  GLUCAP 130* 178* 122* 198* 130*    Microbiology: Results for orders placed or performed during the hospital encounter of 04/08/18  Urine culture     Status: None   Collection Time: 04/08/18 11:43 AM  Result Value Ref Range Status   Specimen Description   Final    URINE, RANDOM Performed at [facility], [address], [place], [place] [address]    Special Requests   Final    NONE Performed at [facility], [address], [place], [place] [address]    Culture   Final    NO GROWTH Performed at [facility], [address] [place], [place] [address]    Report Status 04/09/2018 FINAL  Final    Coagulation Studies: No results for input(s): LABPROT, INR in the last 72 hours.  Imaging: No results found.  Medications:   I have reviewed the patient's current medications. Prior to Admission:  Medications Prior to Admission  Medication Sig Dispense Refill Last Dose  . Fluticasone-Salmeterol (ADVAIR DISKUS) 250-50 MCG/DOSE AEPB Inhale 1 puff into the lungs daily as needed (for wheezing or shortness).    prn at prn  . ipratropium-albuterol (DUONEB) 0.5-2.5 (3) MG/3ML SOLN Take 3 mLs by nebulization every 6 (six) hours as needed (for SOB).   prn at prn  . polyvinyl alcohol (LIQUIFILM TEARS) 1.4 % ophthalmic solution Place 1 drop into both eyes as needed for dry eyes. Reported on 12/21/2015   prn at prn  . triamcinolone cream (KENALOG) 0.1 % Apply 1 application topically 2 (two) times daily as needed (for irritation).   prn at prn  . albuterol (PROAIR HFA) 108 (90 Base) MCG/ACT inhaler Inhale 2 puffs into the lungs every 4 (four) hours as needed for wheezing or shortness of breath.    Past Month at Unknown time  . apixaban (ELIQUIS) 5 MG TABS tablet Take 1 tablet (5 mg total) by mouth 2 (two) times daily. 60 tablet 0 06/09/2016 at Unknown time  . atorvastatin (LIPITOR) 20 MG tablet Take 1 tablet (20 mg total) by mouth daily at 6 PM.   06/09/2016 at Unknown time  . azithromycin (ZITHROMAX Z-PAK) 250 MG tablet 1 tablet (250 mg) once daily on Days 2 through 5. 4 each 0   . brimonidine (ALPHAGAN) 0.2 % ophthalmic solution Place 1 drop into both eyes daily.    06/09/2016 at Unknown time  . carvedilol (COREG) 6.25 MG tablet Take 6.25 mg by mouth 2 (two) times daily with a meal.    06/09/2016 at Unknown time  . furosemide (LASIX) 40 MG tablet Take 1 tablet (40 mg total) by mouth daily. 30 tablet 0 06/09/2016 at Unknown time  . gabapentin (NEURONTIN) 300 MG capsule Take 300 mg by mouth at bedtime.   06/08/2016 at Unknown time  . ibuprofen (ADVIL,MOTRIN) 400 MG tablet Take 400 mg by mouth every 6 (six) hours as needed for moderate pain.   Past Week at Unknown time  . insulin glargine (LANTUS) 100 UNIT/ML injection Inject 0.08 mLs  (8 Units total) into the skin at bedtime. (Patient taking differently: Inject 10 Units into the skin at bedtime. ) 10 mL 0 03/21/2016 at Unknown time  . insulin lispro (HUMALOG) 100 UNIT/ML injection Inject 0-9 Units into the skin 3 (three) times daily before meals. CBG's 70-120 = 0 units; 121-150 =1 units; 151-200 = 2 units; 201-250 =3 units; 251-300 =5 units; 301-350 =7 units; 351-400=9 units and > 400 Notify provider.  unknown at Unknown time  . latanoprost (XALATAN) 0.005 % ophthalmic solution Place 1 drop into both eyes at bedtime.   06/08/2016 at Unknown time  . losartan (COZAAR) 100 MG tablet Take 100 mg by mouth daily.   06/09/2016 at Unknown time  . nateglinide (STARLIX) 120 MG tablet Take 120 mg by mouth daily.   06/09/2016 at Unknown time  . Omega-3 1000 MG CAPS Take 1 g by mouth daily.   06/09/2016 at Unknown time  . omeprazole (PRILOSEC) 20 MG capsule Take 20 mg by mouth daily.   06/09/2016 at Unknown time  . potassium chloride SA (K-DUR,KLOR-CON) 20 MEQ tablet Take 20 mEq by mouth daily.   06/09/2016 at Unknown time  . SPIRIVA HANDIHALER 18 MCG inhalation capsule Place 1 application into inhaler and inhale daily.   06/09/2016 at Unknown time  . terazosin (HYTRIN) 5 MG capsule Take 5 mg by mouth every evening.   06/09/2016 at Unknown time  . trimethoprim-polymyxin b (POLYTRIM) ophthalmic solution Place 2 drops into the right eye every 6 (six) hours. (Patient not taking: Reported on 06/09/2016) 10 mL 0 Not Taking at Unknown time   Scheduled: . apixaban  5 mg Oral BID  . atorvastatin  20 mg Oral q1800  . azithromycin  500 mg Oral Daily  . brimonidine  1 drop Both Eyes Daily  . carvedilol  6.25 mg Oral BID WC  . cefdinir  300 mg Oral Q12H  . docusate sodium  100 mg Oral BID  . furosemide  40 mg Oral Daily  . gabapentin  300 mg Oral QHS  . insulin aspart  0-5 Units Subcutaneous QHS  . insulin aspart  0-9 Units Subcutaneous TID WC  . latanoprost  1 drop Both Eyes QHS  . losartan  100  mg Oral Daily  . pantoprazole  40 mg Oral Daily  . risperiDONE  1 mg Oral QPM  . sodium chloride flush  3 mL Intravenous Q12H  . terazosin  5 mg Oral QPM  . tiotropium  18 mcg Inhalation Daily   Patient seen and examined.  Clinical course and management discussed.  Necessary edits performed.  I agree with the above.  Assessment and plan of care developed and discussed below.    Assessment: 84 y.o with past medical history of atrial fibrillation on anticoagulation, CHF, hypertension, OSA, diabetes mellitus, Dementia with behavioral disturbances, COPD, and CVA presenting with altered mental status and agitation. Found to have acute UTI and CHF exacerbation likely underlying cause of encephalopathy. CT head reviewed and showed chronic changes but potentially new left parietal old subacute hypodensity. MRI brain attempted x 3 but unable to obtain due to current altered mental status despite sedation.  MRI being performed in an attempt to rule out a shower of emboli due to atrial fibrillation but we would not be able to reverse that anyway so we will defer MRI at this time.  Worsening mental status likely secondary to progression of dementia with coexistent medical issues and hospitalization.    Plan: 1. Continue prophylaxis with Eliquis for afib given stroke risk factors 2. Agree with medical management of metabolic derangements 3. PT/OT and Speech evaluation 4. Agree with necessity of antipsychotics  This patient was staffed with Dr. Verlon Au, Thad Ranger who personally evaluated patient, reviewed documentation and agreed with assessment and plan of care as above.  Webb Silversmith, DNP, FNP-BC Board certified Nurse Practitioner Neurology Department   LOS: 5 days   06/28/2018  11:03 AM  Thana Farr, MD  Neurology [phone]  06/28/2018  12:16 PM

## 2018-06-28 NOTE — Care Management Important Message (Signed)
Left another message for daughter to review Medicare IM.

## 2018-06-28 NOTE — Consult Note (Signed)
[facility] Face-to-Face Psychiatry Consult   Reason for Consult: Follow-up for 84 year old man with dementia and agitation Referring Physician: Allena Katz Patient Identification: Bobby Krueger MRN:  [id number] Principal Diagnosis: Acute encephalopathy Diagnosis:  Principal Problem:   Acute encephalopathy Active Problems:   Dementia with behavioral disturbance (HCC)   Total Time spent with patient: 20 minutes  Subjective:   Bobby Krueger is a 84 y.o. male patient admitted with not able to give information.  HPI: Follow-up on consult from yesterday.  This 84 year old man with dementia and encephalopathy is calmer now.  Has not been agitated fighting or causing any harm to himself.  Tolerating medicine without difficulty.  Patient is sedated this afternoon has no complaints no sign of any mood or psychotic symptoms  Past Psychiatric History: See previous note  Risk to Self:   Risk to Others:   Prior Inpatient Therapy:   Prior Outpatient Therapy:    Past Medical History:  Past Medical History:  Diagnosis Date  . A-fib (HCC)   . 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 03/2014: EF 35%, normal nuc in 05/2014 b. echo 11/2015: EF 40-45% w/ diffuse HK and Grade 1 DD  . COPD (chronic obstructive pulmonary disease) (HCC)   . Diabetes mellitus without complication (HCC)   . High cholesterol   . Hypertension     Past Surgical History:  Procedure Laterality Date  . BACK SURGERY    . CARPAL TUNNEL RELEASE    . CHOLECYSTECTOMY     Family History:  Family History  Problem Relation Age of Onset  . Prostate cancer Unknown    Family Psychiatric  History: See previous note Social History:  Social History   Substance and Sexual Activity  Alcohol Use No     Social History   Substance and Sexual Activity  Drug Use No    Social History   Socioeconomic History  . Marital status: Widowed    Spouse name: Not on file  . Number of  children: Not on file  . Years of education: Not on file  . Highest education level: Not on file  Occupational History  . Occupation: retired  Engineer, production  . Financial resource strain: Not on file  . Food insecurity:    Worry: Not on file    Inability: Not on file  . Transportation needs:    Medical: Not on file    Non-medical: Not on file  Tobacco Use  . Smoking status: Former Games developer  . Smokeless tobacco: Never Used  Substance and Sexual Activity  . Alcohol use: No  . Drug use: No  . Sexual activity: Not on file  Lifestyle  . Physical activity:    Days per week: Not on file    Minutes per session: Not on file  . Stress: Not on file  Relationships  . Social connections:    Talks on phone: Not on file    Gets together: Not on file    Attends religious service: Not on file    Active member of club or organization: Not on file    Attends meetings of clubs or organizations: Not on file    Relationship status: Not on file  Other Topics Concern  . Not on file  Social History Narrative  . Not on file   Additional Social History:    Allergies:   Allergies  Allergen Reactions  . Aldactone [Spironolactone] Hives  . Aspirin Hives  . Ciprofloxacin Hives  .  Cyclobenzaprine Hives  . Lexapro [Escitalopram] Hives    Labs:  Results for orders placed or performed during the hospital encounter of 06/23/18 (from the past 48 hour(s))  Glucose, capillary     Status: Abnormal   Collection Time: 06/26/18  9:14 PM  Result Value Ref Range   Glucose-Capillary 144 (H) 70 - 99 mg/dL   Comment 1 Notify RN   Basic metabolic panel     Status: Abnormal   Collection Time: 06/27/18  5:49 AM  Result Value Ref Range   Sodium 140 135 - 145 mmol/L   Potassium 4.7 3.5 - 5.1 mmol/L   Chloride 101 98 - 111 mmol/L   CO2 29 22 - 32 mmol/L   Glucose, Bld 141 (H) 70 - 99 mg/dL   BUN 31 (H) 8 - 23 mg/dL   Creatinine, Ser 4.09 0.61 - 1.24 mg/dL   Calcium 8.7 (L) 8.9 - 10.3 mg/dL   GFR calc non  Af Amer 52 (L) >60 mL/min   GFR calc Af Amer 60 (L) >60 mL/min   Anion gap 10 5 - 15    Comment: Performed at [facility], [address] [place], [place] [address]  Magnesium     Status: None   Collection Time: 06/27/18  5:49 AM  Result Value Ref Range   Magnesium 2.0 1.7 - 2.4 mg/dL    Comment: Performed at [facility], [address] [place], [place] [address]  Glucose, capillary     Status: Abnormal   Collection Time: 06/27/18  8:07 AM  Result Value Ref Range   Glucose-Capillary 130 (H) 70 - 99 mg/dL  Glucose, capillary     Status: Abnormal   Collection Time: 06/27/18 11:54 AM  Result Value Ref Range   Glucose-Capillary 178 (H) 70 - 99 mg/dL  Glucose, capillary     Status: Abnormal   Collection Time: 06/27/18  5:03 PM  Result Value Ref Range   Glucose-Capillary 122 (H) 70 - 99 mg/dL  Glucose, capillary     Status: Abnormal   Collection Time: 06/27/18  8:39 PM  Result Value Ref Range   Glucose-Capillary 198 (H) 70 - 99 mg/dL   Comment 1 Notify RN    Comment 2 Document in Chart   Basic metabolic panel     Status: Abnormal   Collection Time: 06/28/18  6:15 AM  Result Value Ref Range   Sodium 142 135 - 145 mmol/L   Potassium 4.2 3.5 - 5.1 mmol/L   Chloride 104 98 - 111 mmol/L   CO2 28 22 - 32 mmol/L   Glucose, Bld 147 (H) 70 - 99 mg/dL   BUN 37 (H) 8 - 23 mg/dL   Creatinine, Ser 5.62 (H) 0.61 - 1.24 mg/dL   Calcium 8.8 (L) 8.9 - 10.3 mg/dL   GFR calc non Af Amer 40 (L) >60 mL/min   GFR calc Af Amer 47 (L) >60 mL/min   Anion gap 10 5 - 15    Comment: Performed at [facility] [address] [place], [place] [address]  Glucose, capillary     Status: Abnormal   Collection Time: 06/28/18  8:03 AM  Result Value Ref Range   Glucose-Capillary 130 (H) 70 - 99 mg/dL  Glucose, capillary     Status: None   Collection Time: 06/28/18 11:59 AM  Result Value Ref Range   Glucose-Capillary 85 70 - 99 mg/dL  Glucose, capillary     Status: Abnormal    Collection Time:  06/28/18  4:48 PM  Result Value Ref Range   Glucose-Capillary 110 (H) 70 - 99 mg/dL    Current Facility-Administered Medications  Medication Dose Route Frequency Provider Last Rate Last Dose  . 0.9 %  sodium chloride infusion   Intravenous PRN Adrian Saran, MD   Stopped at 06/26/18 1235  . acetaminophen (TYLENOL) tablet 650 mg  650 mg Oral Q6H PRN Cammy Copa, MD       Or  . acetaminophen (TYLENOL) suppository 650 mg  650 mg Rectal Q6H PRN Cammy Copa, MD      . albuterol (PROVENTIL) (2.5 MG/3ML) 0.083% nebulizer solution 2.5 mg  2.5 mg Inhalation Q4H PRN Cammy Copa, MD      . apixaban Everlene Balls) tablet 5 mg  5 mg Oral BID Cammy Copa, MD   5 mg at 06/28/18 0943  . atorvastatin (LIPITOR) tablet 20 mg  20 mg Oral q1800 Cammy Copa, MD   20 mg at 06/28/18 1741  . azithromycin (ZITHROMAX) tablet 500 mg  500 mg Oral Daily Adrian Saran, MD   500 mg at 06/28/18 0942  . bisacodyl (DULCOLAX) EC tablet 5 mg  5 mg Oral Daily PRN Cammy Copa, MD      . brimonidine (ALPHAGAN) 0.2 % ophthalmic solution 1 drop  1 drop Both Eyes Daily Cammy Copa, MD   1 drop at 06/27/18 0916  . carvedilol (COREG) tablet 6.25 mg  6.25 mg Oral BID WC Cammy Copa, MD   6.25 mg at 06/28/18 1741  . cefdinir (OMNICEF) capsule 300 mg  300 mg Oral Q12H Adrian Saran, MD   300 mg at 06/28/18 0942  . docusate sodium (COLACE) capsule 100 mg  100 mg Oral BID Cammy Copa, MD   100 mg at 06/28/18 6213  . gabapentin (NEURONTIN) capsule 300 mg  300 mg Oral QHS Cammy Copa, MD   300 mg at 06/27/18 2151  . haloperidol lactate (HALDOL) injection 0.5 mg  0.5 mg Intravenous Q6H PRN Adrian Saran, MD   0.5 mg at 06/28/18 1353  . HYDROcodone-acetaminophen (NORCO/VICODIN) 5-325 MG per tablet 1-2 tablet  1-2 tablet Oral Q4H PRN Cammy Copa, MD   2 tablet at 06/25/18 1530  . insulin aspart (novoLOG) injection 0-5 Units  0-5 Units Subcutaneous QHS Cammy Copa, MD   4 Units at 06/25/18 2117  . insulin aspart  (novoLOG) injection 0-9 Units  0-9 Units Subcutaneous TID WC Cammy Copa, MD   1 Units at 06/28/18 0830  . ipratropium-albuterol (DUONEB) 0.5-2.5 (3) MG/3ML nebulizer solution 3 mL  3 mL Nebulization Q6H PRN Adrian Saran, MD   3 mL at 06/26/18 1057  . latanoprost (XALATAN) 0.005 % ophthalmic solution 1 drop  1 drop Both Eyes QHS Cammy Copa, MD   1 drop at 06/25/18 2115  . losartan (COZAAR) tablet 100 mg  100 mg Oral Daily Cammy Copa, MD   100 mg at 06/28/18 0865  . ondansetron (ZOFRAN) tablet 4 mg  4 mg Oral Q6H PRN Cammy Copa, MD       Or  . ondansetron [facility]) injection 4 mg  4 mg Intravenous Q6H PRN Cammy Copa, MD      . pantoprazole (PROTONIX) EC tablet 40 mg  40 mg Oral Daily Cammy Copa, MD   40 mg at 06/28/18 0943  . polyvinyl alcohol (LIQUIFILM TEARS) 1.4 % ophthalmic solution 1 drop  1 drop Both Eyes PRN Cammy Copa, MD      . risperiDONE (RISPERDAL M-TABS) disintegrating tablet 1 mg  1  mg Oral BID Adrian Saran, MD      . sodium chloride flush (NS) 0.9 % injection 3 mL  3 mL Intravenous Q12H Adrian Saran, MD   3 mL at 06/28/18 0943  . terazosin (HYTRIN) capsule 5 mg  5 mg Oral QPM Cammy Copa, MD   5 mg at 06/28/18 1741  . tiotropium (SPIRIVA) inhalation capsule ([facility] use ONLY) 18 mcg  18 mcg Inhalation Daily Cammy Copa, MD   18 mcg at 06/27/18 0908  . traZODone (DESYREL) tablet 25 mg  25 mg Oral QHS PRN Cammy Copa, MD   25 mg at 06/27/18 2151    Musculoskeletal: Strength & Muscle Tone: decreased Gait & Station: unable to stand Patient leans: N/A  Psychiatric Specialty Exam: Physical Exam  Nursing note and vitals reviewed. Constitutional: He appears well-developed and well-nourished.  HENT:  Head: Normocephalic and atraumatic.  Eyes: Pupils are equal, round, and reactive to light. Conjunctivae are normal.  Neck: Normal range of motion.  Cardiovascular: Regular rhythm and normal heart sounds.  Respiratory: Effort normal. No respiratory distress.  GI:  Soft.  Musculoskeletal: Normal range of motion.  Neurological: He is alert.  Skin: Skin is warm and dry.  Psychiatric: His affect is blunt. His speech is delayed. He is not agitated.    Review of Systems  Unable to perform ROS: Dementia    Blood pressure (!) 134/93, pulse 72, temperature 97.7 F (36.5 C), temperature source Oral, resp. rate 19, height 5\' 6"  (1.676 m), weight 77.7 kg, SpO2 100 %.Body mass index is 27.65 kg/m.  General Appearance: Casual  Eye Contact:  None  Speech:  Negative  Volume:  Decreased  Mood:  Negative  Affect:  Negative  Thought Process:  NA  Orientation:  Negative  Thought Content:  Negative  Suicidal Thoughts:  No  Homicidal Thoughts:  No  Memory:  Negative  Judgement:  Negative  Insight:  Negative  Psychomotor Activity:  Negative  Concentration:  Concentration: Negative  Recall:  Negative  Fund of Knowledge:  Negative  Language:  Negative  Akathisia:  Negative  Handed:  Right  AIMS (if indicated):     Assets:  Social Support  ADL's:  Impaired  Cognition:  Impaired,  Severe  Sleep:        Treatment Plan Summary: Plan No change to treatment plan.  Medicines as written appear to be adequate to assist with control of dangerous delirium  Disposition: Patient does not meet criteria for psychiatric inpatient admission. Supportive therapy provided about ongoing stressors.  Mordecai Rasmussen, MD 06/28/2018 5:53 PM

## 2018-06-28 NOTE — Consult Note (Signed)
  PHARMACY CONSULT NOTE - FOLLOW UP  Pharmacy Consult for Electrolyte Monitoring and Replacement   Recent Labs: Potassium (mmol/L)  Date Value  06/28/2018 4.2  06/06/2013 3.5   Magnesium (mg/dL)  Date Value  17/49/4496 2.0   Calcium (mg/dL)  Date Value  75/91/6384 8.8 (L)   Calcium, Total (mg/dL)  Date Value  66/59/9357 8.5   Albumin (g/dL)  Date Value  01/77/9390 3.2 (L)  06/05/2013 3.5   Phosphorus (mg/dL)  Date Value  30/03/2329 4.3  ]   Assessment: K 4.2, patient is currently on lasix 40mg  PO daily. SCr is trending up.  Goal of Therapy:  Electrolytes WNL's  Plan:  No supplementation indicated.   Recheck in AM  Clovia Cuff, PharmD, BCPS 06/28/2018 10:27 AM

## 2018-06-28 NOTE — Plan of Care (Signed)
  Problem: Education: Goal: Knowledge of General Education information will improve Description Including pain rating scale, medication(s)/side effects and non-pharmacologic comfort measures Outcome: Not Progressing Note:  Patient remains demented today, no improvement in mental status or orientation. Sitter and tele-sitter order d/c'd earlier today. Patient to discharge to SNF hopefully tomorrow afternoon. Will continue to closely monitor. Jari Favre [facility]

## 2018-06-28 NOTE — Progress Notes (Signed)
Physical Therapy Treatment Patient Details Name: Bobby Krueger MRN: [id number] DOB: 1927-01-20 Today's Date: 06/28/2018    History of Present Illness Pt is an 84 y.o. male presenting to hospital 06/23/18 with altered mental status; pt also noted to be combative, SOB, and wheezing.  Pt admitted with acute on chronic encephalopathy, acute UTI, acute CHF exacerbation, acute respiratory distress, and hypokalemia.  CT of head showing potentially new L parietal versus old/subacute hypodensity (MRI attempted to clarify but has not been successful).  PMH includes a-fib, systolic and diastolic CHF, COPD, DM, htn, CVA, bradycardia, back surgery, CTR, dementia.    PT Comments    Patient lying in bed with NA present. Patient requesting something to drink over and over. Agrees to try to get to edge of bed. Patient required min assist with supine>sit and has good sitting balance. Patient requires min assist with sit to stand transfer and ambulated 30 feet with rolling walker and cga. Fatigued at end of walking session. Patient's O2 remained > 95% throughout session on room air. Patient will continue to benefit from skilled PT while here to improve functional mobility and strength.         Follow Up Recommendations  SNF     Equipment Recommendations  Rolling walker with 5" wheels    Recommendations for Other Services       Precautions / Restrictions Precautions Precautions: Fall Restrictions Weight Bearing Restrictions: No    Mobility  Bed Mobility Overal bed mobility: Modified Independent;Needs Assistance Bed Mobility: Sit to Supine       Sit to supine: Min assist      Transfers Overall transfer level: Needs assistance Equipment used: Rolling walker (2 wheeled) Transfers: Sit to/from Stand              Ambulation/Gait Ambulation/Gait assistance: Min assist Gait Distance (Feet): 30 Feet Assistive device: Rolling walker (2 wheeled) Gait Pattern/deviations: Step-to  pattern;Shuffle;Decreased stride length Gait velocity: decreased       Stairs             Wheelchair Mobility    Modified Rankin (Stroke Patients Only)       Balance Overall balance assessment: Needs assistance Sitting-balance support: Feet supported;Single extremity supported Sitting balance-Leahy Scale: Good     Standing balance support: Bilateral upper extremity supported Standing balance-Leahy Scale: Good Standing balance comment: requires rw for assistance with standing activities                            Cognition Arousal/Alertness: Awake/alert   Overall Cognitive Status: No family/caregiver present to determine baseline cognitive functioning                                        Exercises      General Comments        Pertinent Vitals/Pain Pain Assessment: Faces Faces Pain Scale: Hurts a little bit Pain Location: left knee Pain Descriptors / Indicators: Discomfort Pain Intervention(s): Monitored during session    Home Living                 Additional Comments: according to notes, patient lives at facility. He is unable to provide history    Prior Function        Comments: unsure, no family present, patient unable to provide history   PT Goals (current goals can now be found  in the care plan section) Acute Rehab PT Goals Patient Stated Goal: to get something to drink PT Goal Formulation: With patient Time For Goal Achievement: 07/09/18 Potential to Achieve Goals: Fair    Frequency    Min 2X/week      PT Plan      Co-evaluation              AM-PAC PT "6 Clicks" Mobility   Outcome Measure  Help needed turning from your back to your side while in a flat bed without using bedrails?: A Little Help needed moving from lying on your back to sitting on the side of a flat bed without using bedrails?: A Little Help needed moving to and from a bed to a chair (including a wheelchair)?: A  Little Help needed standing up from a chair using your arms (e.g., wheelchair or bedside chair)?: A Little Help needed to walk in hospital room?: A Little Help needed climbing 3-5 steps with a railing? : Total 6 Click Score: 16    End of Session Equipment Utilized During Treatment: Gait belt Activity Tolerance: Patient limited by fatigue Patient left: in bed;with bed alarm set Nurse Communication: Mobility status PT Visit Diagnosis: Other abnormalities of gait and mobility (R26.89);Muscle weakness (generalized) (M62.81);Unsteadiness on feet (R26.81)     Time: 1916-6060 PT Time Calculation (min) (ACUTE ONLY): 25 min  Charges:  $Gait Training: 8-22 mins $Therapeutic Activity: 8-22 mins                     Brysan Mcevoy, PT, GCS 06/28/18,2:53 PM

## 2018-06-29 LAB — BASIC METABOLIC PANEL
Anion gap: 8 (ref 5–15)
BUN: 35 mg/dL — ABNORMAL HIGH (ref 8–23)
CO2: 29 mmol/L (ref 22–32)
Calcium: 8.6 mg/dL — ABNORMAL LOW (ref 8.9–10.3)
Chloride: 107 mmol/L (ref 98–111)
Creatinine, Ser: 1.29 mg/dL — ABNORMAL HIGH (ref 0.61–1.24)
GFR calc Af Amer: 57 mL/min — ABNORMAL LOW (ref >60)
GFR calc non Af Amer: 49 mL/min — ABNORMAL LOW (ref >60)
Glucose, Bld: 135 mg/dL — ABNORMAL HIGH (ref 70–99)
Potassium: 4.1 mmol/L (ref 3.5–5.1)
Sodium: 144 mmol/L (ref 135–145)

## 2018-06-29 LAB — GLUCOSE, CAPILLARY
Glucose-Capillary: 125 mg/dL — ABNORMAL HIGH (ref 70–99)
Glucose-Capillary: 120 mg/dL — ABNORMAL HIGH (ref 70–99)

## 2018-06-29 MED ORDER — TRAZODONE HCL 50 MG PO TABS: 25.0000 mg | ORAL_TABLET | Freq: Every evening | ORAL | 0 refills | Status: AC | PRN

## 2018-06-29 MED ORDER — INSULIN GLARGINE 100 UNIT/ML ~~LOC~~ SOLN: 10.0000 [IU] | Freq: Every day | SUBCUTANEOUS | Status: AC

## 2018-06-29 MED ORDER — RISPERIDONE 1 MG PO TBDP: 1.0000 mg | ORAL_TABLET | Freq: Two times a day (BID) | ORAL | 0 refills | Status: AC

## 2018-06-29 MED ORDER — QUETIAPINE FUMARATE 25 MG PO TABS: 25.0000 mg | ORAL_TABLET | Freq: Every evening | ORAL | 0 refills | Status: AC | PRN

## 2018-06-29 NOTE — Progress Notes (Signed)
Patient answers to his name but otherwise confused.  Follows minimal direction (opens mouth for eating when asked).  Patient awake, meds given crushed in applesauce at this time.  Patient does not appear to be in any distress at this time.

## 2018-06-29 NOTE — Progress Notes (Signed)
Patient transferred to [facility] via ambulance.  Tele and IV D/C'd prior to transfer.  Report called and given by this Rn to [facility] LPN.  This RN received message that facility wanted return call with question about medications.  This RN called and left message with supervisor.  Spoke to [facility] regarding antibiotic on discharge paperwork.  Per Dr. Juliene Pina, she does not want patient to have the antibiotic.

## 2018-06-29 NOTE — Progress Notes (Signed)
Patient resting in bed.  Sleepy but arousable.  Patient has not needed safety companion or tele sitter in 24 hours.

## 2018-06-29 NOTE — Consult Note (Signed)
  PHARMACY CONSULT NOTE - FOLLOW UP  Pharmacy Consult for Electrolyte Monitoring and Replacement   Recent Labs: Potassium (mmol/L)  Date Value  06/29/2018 4.1  06/06/2013 3.5   Magnesium (mg/dL)  Date Value  92/92/4462 2.0   Calcium (mg/dL)  Date Value  86/38/1771 8.6 (L)   Calcium, Total (mg/dL)  Date Value  16/57/9038 8.5   Albumin (g/dL)  Date Value  33/38/3291 3.2 (L)  06/05/2013 3.5   Phosphorus (mg/dL)  Date Value  91/66/0600 4.3  ]   Assessment: K 4.1  Goal of Therapy:  Electrolytes WNL's  Plan:  No supplementation indicated.  Recheck in AM  Bari Mantis PharmD Clinical Pharmacist 06/29/2018

## 2018-06-29 NOTE — Clinical Social Work Note (Signed)
Patient to be d/c'ed today to [facility].  Patient and family agreeable to plans will transport via ems RN to call report.  CSW updated patient's daughter Lawanna Kobus [phone].   Windell Moulding, MSW, Theresia Majors [phone]

## 2018-06-29 NOTE — Discharge Summary (Signed)
[facility] [place] [facility]   PATIENT NAME: Bobby Krueger    MR#:  [id number]  DATE OF BIRTH:  [birth year]  DATE OF ADMISSION:  06/23/2018 ADMITTING PHYSICIAN: Cammy Copa, MD  DATE OF DISCHARGE: 06/29/2018  PRIMARY CARE PHYSICIAN: [facility]    ADMISSION DIAGNOSIS:  SOB (shortness of breath) [R06.02] Altered mental status, unspecified altered mental status type [R41.82] Cerebrovascular accident (CVA), unspecified mechanism (HCC) [I63.9]  DISCHARGE DIAGNOSIS:  Principal Problem:   Acute encephalopathy Active Problems:   Dementia with behavioral disturbance (HCC)   SECONDARY DIAGNOSIS:   Past Medical History:  Diagnosis Date  . A-fib (HCC)   . 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 03/2014: EF 35%, normal nuc in 05/2014 b. echo 11/2015: EF 40-45% w/ diffuse HK and Grade 1 DD  . COPD (chronic obstructive pulmonary disease) (HCC)   . Diabetes mellitus without complication (HCC)   . High cholesterol   . Hypertension     HOSPITAL COURSE:   84 year old male with history of chronic combined systolic and diastolic heart failure ejection fraction 25-30%, atrial fibrillation, diabetes and dementia who presents from home via EMS due to increased confusion.  1.  Acute metabolic encephalopathy in the setting of community-acquired pneumonia, CHF exacerbation and hypokalemia on top of underlying dementia: Patient is at his baseline Neurology consultation appreciated. MRI attempted x3.  Patient unable to tolerate MRI.  Discussed with neurology and we discontinued MRI. He appears to be back to his baseline.  2.  Acute on chronic combined systolic and diastolic heart failure ejection fraction 25 to 30%: Patient has diuresed well.  He will be discharged on oral Lasix.  BMP needs to be monitored closely.  Please check BMP on Saturday.CHF clinic referral upon discharge  3.  Community-acquired  pneumonia: He was changed to oral OMNICEF and oral azithromycin. He completed treatment and does not need antibiotics upon discharge  4.  Hypokalemia: Replete PRN   5.  Right lower extremity edema: Doppler was negative for DVT. 6.  PAF/atrial flutter: Continue Coreg for heart rate control Continue Eliquis  7.  BPH: Continue Hytrin  8.  Essential hypertension: Continue Coreg and losartan 9.  Hyponatremia: Resolved  10.  Dementia: Patient has significant sundowning.    Psychiatry consultation was appreciated.  He will be discharged on Risperdal which can be weaned if he is too sedated.  He also has PRN trazodone and Seroquel.  DISCHARGE CONDITIONS AND DIET:   Stable for discharge on dysphagia 1 diet with aspiration precautions  CONSULTS OBTAINED:  Treatment Team:  Jonna Munro, MD Kym Groom, MD Clapacs, Jackquline Denmark, MD  DRUG ALLERGIES:   Allergies  Allergen Reactions  . Aldactone [Spironolactone] Hives  . Aspirin Hives  . Ciprofloxacin Hives  . Cyclobenzaprine Hives  . Lexapro [Escitalopram] Hives    DISCHARGE MEDICATIONS:   Allergies as of 06/29/2018      Reactions   Aldactone [spironolactone] Hives   Aspirin Hives   Ciprofloxacin Hives   Cyclobenzaprine Hives   Lexapro [escitalopram] Hives      Medication List    TAKE these medications   ADVAIR DISKUS 250-50 MCG/DOSE Aepb Generic drug:  Fluticasone-Salmeterol Inhale 1 puff into the lungs daily as needed (for wheezing or shortness).   apixaban 5 MG Tabs tablet Commonly known as:  ELIQUIS Take 1 tablet (5 mg total) by mouth 2 (two) times daily.   atorvastatin 20 MG tablet Commonly known as:  LIPITOR Take 1 tablet (20 mg total) by mouth daily at 6 PM.   azithromycin 250 MG tablet Commonly known as:  ZITHROMAX Z-PAK 1 tablet (250 mg) once daily on Days 2 through 5.   brimonidine 0.2 % ophthalmic solution Commonly known as:  ALPHAGAN Place 1 drop into both eyes daily.   carvedilol 6.25 MG  tablet Commonly known as:  COREG Take 6.25 mg by mouth 2 (two) times daily with a meal.   furosemide 40 MG tablet Commonly known as:  LASIX Take 1 tablet (40 mg total) by mouth daily.   gabapentin 300 MG capsule Commonly known as:  NEURONTIN Take 300 mg by mouth at bedtime.   ibuprofen 400 MG tablet Commonly known as:  ADVIL,MOTRIN Take 400 mg by mouth every 6 (six) hours as needed for moderate pain.   insulin glargine 100 UNIT/ML injection Commonly known as:  LANTUS Inject 0.1 mLs (10 Units total) into the skin at bedtime.   insulin lispro 100 UNIT/ML injection Commonly known as:  HUMALOG Inject 0-9 Units into the skin 3 (three) times daily before meals. CBG's 70-120 = 0 units; 121-150 =1 units; 151-200 = 2 units; 201-250 =3 units; 251-300 =5 units; 301-350 =7 units; 351-400=9 units and > 400 Notify provider.   ipratropium-albuterol 0.5-2.5 (3) MG/3ML Soln Commonly known as:  DUONEB Take 3 mLs by nebulization every 6 (six) hours as needed (for SOB).   latanoprost 0.005 % ophthalmic solution Commonly known as:  XALATAN Place 1 drop into both eyes at bedtime.   losartan 100 MG tablet Commonly known as:  COZAAR Take 100 mg by mouth daily.   nateglinide 120 MG tablet Commonly known as:  STARLIX Take 120 mg by mouth daily.   Omega-3 1000 MG Caps Take 1 g by mouth daily.   omeprazole 20 MG capsule Commonly known as:  PRILOSEC Take 20 mg by mouth daily.   polyvinyl alcohol 1.4 % ophthalmic solution Commonly known as:  LIQUIFILM TEARS Place 1 drop into both eyes as needed for dry eyes. Reported on 12/21/2015   potassium chloride SA 20 MEQ tablet Commonly known as:  K-DUR,KLOR-CON Take 20 mEq by mouth daily.   PROAIR HFA 108 (90 Base) MCG/ACT inhaler Generic drug:  albuterol Inhale 2 puffs into the lungs every 4 (four) hours as needed for wheezing or shortness of breath.   QUEtiapine 25 MG tablet Commonly known as:  SEROQUEL Take 1 tablet (25 mg total) by mouth at  bedtime as needed (agitation).   risperiDONE 1 MG disintegrating tablet Commonly known as:  RISPERDAL M-TABS Take 1 tablet (1 mg total) by mouth 2 (two) times daily.   SPIRIVA HANDIHALER 18 MCG inhalation capsule Generic drug:  tiotropium Place 1 application into inhaler and inhale daily.   terazosin 5 MG capsule Commonly known as:  HYTRIN Take 5 mg by mouth every evening.   traZODone 50 MG tablet Commonly known as:  DESYREL Take 0.5 tablets (25 mg total) by mouth at bedtime as needed for sleep.   triamcinolone cream 0.1 % Commonly known as:  KENALOG Apply 1 application topically 2 (two) times daily as needed (for irritation).   trimethoprim-polymyxin b ophthalmic solution Commonly known as:  POLYTRIM Place 2 drops into the right eye every 6 (six) hours.         Today   CHIEF COMPLAINT:   Patient no longer has a sitter no acute events overnight   VITAL SIGNS:  Blood pressure (!) 144/73, pulse 89, temperature 98.3 F (  36.8 C), temperature source Axillary, resp. rate (!) 23, height 5\' 6"  (1.676 m), weight 77.6 kg, SpO2 97 %.   REVIEW OF SYSTEMS:  Review of Systems  Unable to perform ROS: Dementia     PHYSICAL EXAMINATION:  GENERAL:  84 y.o.-year-old patient lying in the bed with no acute distress.  NECK:  Supple, no jugular venous distention. No thyroid enlargement, no tenderness.  LUNGS: Normal breath sounds bilaterally, no wheezing, rales,rhonchi  No use of accessory muscles of respiration.  CARDIOVASCULAR: S1, S2 normal. No murmurs, rubs, or gallops.  ABDOMEN: Soft, non-tender, non-distended. Bowel sounds present. No organomegaly or mass.  EXTREMITIES: No pedal edema, cyanosis, or clubbing.  PSYCHIATRIC: The patient is alert and oriented x name only SKIN: No obvious rash, lesion, or ulcer.   DATA REVIEW:   CBC Recent Labs  Lab 06/26/18 0349  WBC 5.3  HGB 10.2*  HCT 33.5*  PLT 156    Chemistries  Recent Labs  Lab 06/23/18 1706  06/27/18 0549   06/29/18 0803  NA 142   < > 140   < > 144  K 2.7*   < > 4.7   < > 4.1  CL 99   < > 101   < > 107  CO2 35*   < > 29   < > 29  GLUCOSE 228*   < > 141*   < > 135*  BUN 14   < > 31*   < > 35*  CREATININE 0.99   < > 1.23   < > 1.29*  CALCIUM 8.9   < > 8.7*   < > 8.6*  MG  --    < > 2.0  --   --   AST 23  --   --   --   --   ALT 18  --   --   --   --   ALKPHOS 100  --   --   --   --   BILITOT 0.5  --   --   --   --    < > = values in this interval not displayed.    Cardiac Enzymes Recent Labs  Lab 06/23/18 2023  TROPONINI 0.05*    Microbiology Results  @MICRORSLT48 @  RADIOLOGY:  No results found.    Allergies as of 06/29/2018      Reactions   Aldactone [spironolactone] Hives   Aspirin Hives   Ciprofloxacin Hives   Cyclobenzaprine Hives   Lexapro [escitalopram] Hives      Medication List    TAKE these medications   ADVAIR DISKUS 250-50 MCG/DOSE Aepb Generic drug:  Fluticasone-Salmeterol Inhale 1 puff into the lungs daily as needed (for wheezing or shortness).   apixaban 5 MG Tabs tablet Commonly known as:  ELIQUIS Take 1 tablet (5 mg total) by mouth 2 (two) times daily.   atorvastatin 20 MG tablet Commonly known as:  LIPITOR Take 1 tablet (20 mg total) by mouth daily at 6 PM.   azithromycin 250 MG tablet Commonly known as:  ZITHROMAX Z-PAK 1 tablet (250 mg) once daily on Days 2 through 5.   brimonidine 0.2 % ophthalmic solution Commonly known as:  ALPHAGAN Place 1 drop into both eyes daily.   carvedilol 6.25 MG tablet Commonly known as:  COREG Take 6.25 mg by mouth 2 (two) times daily with a meal.   furosemide 40 MG tablet Commonly known as:  LASIX Take 1 tablet (40 mg total) by mouth daily.  gabapentin 300 MG capsule Commonly known as:  NEURONTIN Take 300 mg by mouth at bedtime.   ibuprofen 400 MG tablet Commonly known as:  ADVIL,MOTRIN Take 400 mg by mouth every 6 (six) hours as needed for moderate pain.   insulin glargine 100 UNIT/ML  injection Commonly known as:  LANTUS Inject 0.1 mLs (10 Units total) into the skin at bedtime.   insulin lispro 100 UNIT/ML injection Commonly known as:  HUMALOG Inject 0-9 Units into the skin 3 (three) times daily before meals. CBG's 70-120 = 0 units; 121-150 =1 units; 151-200 = 2 units; 201-250 =3 units; 251-300 =5 units; 301-350 =7 units; 351-400=9 units and > 400 Notify provider.   ipratropium-albuterol 0.5-2.5 (3) MG/3ML Soln Commonly known as:  DUONEB Take 3 mLs by nebulization every 6 (six) hours as needed (for SOB).   latanoprost 0.005 % ophthalmic solution Commonly known as:  XALATAN Place 1 drop into both eyes at bedtime.   losartan 100 MG tablet Commonly known as:  COZAAR Take 100 mg by mouth daily.   nateglinide 120 MG tablet Commonly known as:  STARLIX Take 120 mg by mouth daily.   Omega-3 1000 MG Caps Take 1 g by mouth daily.   omeprazole 20 MG capsule Commonly known as:  PRILOSEC Take 20 mg by mouth daily.   polyvinyl alcohol 1.4 % ophthalmic solution Commonly known as:  LIQUIFILM TEARS Place 1 drop into both eyes as needed for dry eyes. Reported on 12/21/2015   potassium chloride SA 20 MEQ tablet Commonly known as:  K-DUR,KLOR-CON Take 20 mEq by mouth daily.   PROAIR HFA 108 (90 Base) MCG/ACT inhaler Generic drug:  albuterol Inhale 2 puffs into the lungs every 4 (four) hours as needed for wheezing or shortness of breath.   QUEtiapine 25 MG tablet Commonly known as:  SEROQUEL Take 1 tablet (25 mg total) by mouth at bedtime as needed (agitation).   risperiDONE 1 MG disintegrating tablet Commonly known as:  RISPERDAL M-TABS Take 1 tablet (1 mg total) by mouth 2 (two) times daily.   SPIRIVA HANDIHALER 18 MCG inhalation capsule Generic drug:  tiotropium Place 1 application into inhaler and inhale daily.   terazosin 5 MG capsule Commonly known as:  HYTRIN Take 5 mg by mouth every evening.   traZODone 50 MG tablet Commonly known as:  DESYREL Take  0.5 tablets (25 mg total) by mouth at bedtime as needed for sleep.   triamcinolone cream 0.1 % Commonly known as:  KENALOG Apply 1 application topically 2 (two) times daily as needed (for irritation).   trimethoprim-polymyxin b ophthalmic solution Commonly known as:  POLYTRIM Place 2 drops into the right eye every 6 (six) hours.         Stable for discharge stable  Patient should follow up with pcp  CODE STATUS:     Code Status Orders  (From admission, onward)         Start     Ordered   06/24/18 0023  Full code  Continuous     06/24/18 0022        Code Status History    Date Active Date Inactive Code Status Order ID Comments User Context   06/10/2016 0058 06/10/2016 1611 Full Code [id number]  [name], DO Inpatient   03/22/2016 2133 03/27/2016 0039 Full Code [id number]  Lorretta Harp, MD ED   02/10/2016 0603 02/14/2016 1729 Full Code [id number]  Ihor Austin, MD Inpatient   11/16/2015 0520 11/21/2015 1937 Full Code [id number]  Katrinka Blazing,  Loreta Ave, MD ED    Advance Directive Documentation     Most Recent Value  Type of Advance Directive  Healthcare Power of Attorney  Pre-existing out of facility DNR order (yellow form or pink MOST form)  -  "MOST" Form in Place?  -      TOTAL TIME TAKING CARE OF THIS PATIENT: 38 minutes.    Note: This dictation was prepared with Dragon dictation along with smaller phrase technology. Any transcriptional errors that result from this process are unintentional.  Caleigha Zale M.D on 06/29/2018 at 9:19 AM  Between 7am to 6pm - Pager - [phone] After 6pm go to [email or web address] - Social research officer, government  [facility]  Office  [phone]  CC: Primary care physician; [facility]

## 2018-07-04 ENCOUNTER — Ambulatory Visit: Payer: Medicare Other | Admitting: Family

## 2018-07-28 ENCOUNTER — Encounter: Payer: Self-pay | Admitting: Family

## 2018-07-28 ENCOUNTER — Ambulatory Visit: Payer: No Typology Code available for payment source | Attending: Family | Admitting: Family

## 2018-07-28 VITALS — BP 93/54 | HR 47 | Resp 18

## 2018-07-28 DIAGNOSIS — E119 Type 2 diabetes mellitus without complications: Secondary | ICD-10-CM | POA: Insufficient documentation

## 2018-07-28 DIAGNOSIS — F05 Delirium due to known physiological condition: Secondary | ICD-10-CM | POA: Diagnosis not present

## 2018-07-28 DIAGNOSIS — I4891 Unspecified atrial fibrillation: Secondary | ICD-10-CM | POA: Diagnosis not present

## 2018-07-28 DIAGNOSIS — F0390 Unspecified dementia without behavioral disturbance: Secondary | ICD-10-CM | POA: Diagnosis not present

## 2018-07-28 DIAGNOSIS — I110 Hypertensive heart disease with heart failure: Secondary | ICD-10-CM | POA: Diagnosis not present

## 2018-07-28 DIAGNOSIS — Z7901 Long term (current) use of anticoagulants: Secondary | ICD-10-CM | POA: Insufficient documentation

## 2018-07-28 DIAGNOSIS — I5042 Chronic combined systolic (congestive) and diastolic (congestive) heart failure: Secondary | ICD-10-CM | POA: Diagnosis present

## 2018-07-28 DIAGNOSIS — J449 Chronic obstructive pulmonary disease, unspecified: Secondary | ICD-10-CM | POA: Diagnosis not present

## 2018-07-28 DIAGNOSIS — N183 Chronic kidney disease, stage 3 unspecified: Secondary | ICD-10-CM

## 2018-07-28 DIAGNOSIS — R001 Bradycardia, unspecified: Secondary | ICD-10-CM | POA: Insufficient documentation

## 2018-07-28 DIAGNOSIS — Z794 Long term (current) use of insulin: Secondary | ICD-10-CM | POA: Insufficient documentation

## 2018-07-28 DIAGNOSIS — E7800 Pure hypercholesterolemia, unspecified: Secondary | ICD-10-CM | POA: Insufficient documentation

## 2018-07-28 DIAGNOSIS — Z87891 Personal history of nicotine dependence: Secondary | ICD-10-CM | POA: Insufficient documentation

## 2018-07-28 DIAGNOSIS — I10 Essential (primary) hypertension: Secondary | ICD-10-CM

## 2018-07-28 DIAGNOSIS — E785 Hyperlipidemia, unspecified: Secondary | ICD-10-CM | POA: Insufficient documentation

## 2018-07-28 DIAGNOSIS — E1022 Type 1 diabetes mellitus with diabetic chronic kidney disease: Secondary | ICD-10-CM

## 2018-07-28 DIAGNOSIS — Z79899 Other long term (current) drug therapy: Secondary | ICD-10-CM | POA: Diagnosis not present

## 2018-07-28 NOTE — Patient Instructions (Addendum)
Begin weighing daily and call for an overnight weight gain of > 2 pounds or a weekly weight gain of >5 pounds. 

## 2018-07-28 NOTE — Progress Notes (Signed)
Patient ID: Bobby Krueger, male    DOB: 08/02/1926, 85 y.o.   MRN: [id number]  HPI  Bobby Krueger is a 85 y/o male with a history of DM, HTN, hyperlipidemia, COPD, atrial fibrillation, dementia, previous tobacco use and chronic heart failure.   Echo report from 12/16/17 reviewed and showed an EF of 35% along with mild Bobby and trivial TR.   Admitted 06/23/18 due to acute encephalopathy due to pneumonia on top of underlying dementia.Psychiatry and neurology consults obtained. Diuresed and given antibiotics. Does have dementia with significant sundowning. Discharged after 6 days.   He presents today with a chief complaint of an initial visit. Patient has a history of dementia and is unable to answer any questions today. He does look at you when you call his name. Aide that is with him says that he sometimes talks. ROS was done with information provided by the aide. She says that he hasn't had any chest pain, trouble breathing, dizziness, pedal edema or trouble sleeping that she is aware of. Doesn't appear to be in any pain today.  Past Medical History:  Diagnosis Date  . A-fib (HCC)   . 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 03/2014: EF 35%, normal nuc in 05/2014 b. echo 11/2015: EF 40-45% w/ diffuse HK and Grade 1 DD  . COPD (chronic obstructive pulmonary disease) (HCC)   . Dementia (HCC)   . Diabetes mellitus without complication (HCC)   . High cholesterol   . Hypertension    Past Surgical History:  Procedure Laterality Date  . BACK SURGERY    . CARPAL TUNNEL RELEASE    . CHOLECYSTECTOMY     Family History  Problem Relation Age of Onset  . Prostate cancer Other    Social History   Tobacco Use  . Smoking status: Former Games developer  . Smokeless tobacco: Never Used  Substance Use Topics  . Alcohol use: No   Allergies  Allergen Reactions  . Aldactone [Spironolactone] Hives  . Aspirin Hives  . Ciprofloxacin Hives  . Cyclobenzaprine  Hives  . Lexapro [Escitalopram] Hives   Prior to Admission medications   Medication Sig Start Date End Date Taking? Authorizing Provider  albuterol (PROAIR HFA) 108 (90 Base) MCG/ACT inhaler Inhale 2 puffs into the lungs every 4 (four) hours as needed for wheezing or shortness of breath.    Yes [provider]  atorvastatin (LIPITOR) 20 MG tablet Take 1 tablet (20 mg total) by mouth daily at 6 PM. 03/26/16  Yes [name], MD  brimonidine (ALPHAGAN) 0.2 % ophthalmic solution Place 1 drop into both eyes daily.    Yes [provider]  carvedilol (COREG) 6.25 MG tablet Take 6.25 mg by mouth 2 (two) times daily with a meal.    Yes [provider]  Fluticasone-Salmeterol (ADVAIR DISKUS) 250-50 MCG/DOSE AEPB Inhale 1 puff into the lungs daily as needed (for wheezing or shortness).    Yes [provider]  furosemide (LASIX) 40 MG tablet Take 1 tablet (40 mg total) by mouth daily. 02/23/16  Yes Ngetich, Dinah C, NP  gabapentin (NEURONTIN) 300 MG capsule Take 300 mg by mouth at bedtime.   Yes [provider]  ibuprofen (ADVIL,MOTRIN) 400 MG tablet Take 400 mg by mouth every 6 (six) hours as needed for moderate pain.   Yes [provider]  insulin lispro (HUMALOG) 100 UNIT/ML injection Inject 0-9 Units into the skin 3 (three) times daily before meals.  CBG's 70-120 = 0 units; 121-150 =1 units; 151-200 = 2 units; 201-250 =3 units; 251-300 =5 units; 301-350 =7 units; 351-400=9 units and > 400 Notify provider.   Yes [provider]  ipratropium-albuterol (DUONEB) 0.5-2.5 (3) MG/3ML SOLN Take 3 mLs by nebulization every 6 (six) hours as needed (for SOB).   Yes [provider]  latanoprost (XALATAN) 0.005 % ophthalmic solution Place 1 drop into both eyes at bedtime.   Yes [provider]  losartan (COZAAR) 100 MG tablet Take 100 mg by mouth daily.   Yes [provider]  nateglinide (STARLIX) 120 MG tablet Take 120 mg by mouth  daily. 10/02/15  Yes [provider]  Omega-3 1000 MG CAPS Take 1 g by mouth daily.   Yes [provider]  omeprazole (PRILOSEC) 20 MG capsule Take 20 mg by mouth daily.   Yes [provider]  polyvinyl alcohol (LIQUIFILM TEARS) 1.4 % ophthalmic solution Place 1 drop into both eyes as needed for dry eyes. Reported on 12/21/2015   Yes [provider]  potassium chloride SA (K-DUR,KLOR-CON) 20 MEQ tablet Take 20 mEq by mouth daily.   Yes [provider]  QUEtiapine (SEROQUEL) 25 MG tablet Take 1 tablet (25 mg total) by mouth at bedtime as needed (agitation). 06/29/18  Yes Mody, Patricia PesaSital, MD  risperiDONE (RISPERDAL M-TABS) 1 MG disintegrating tablet Take 1 tablet (1 mg total) by mouth 2 (two) times daily. 06/29/18  Yes Mody, Patricia PesaSital, MD  SPIRIVA HANDIHALER 18 MCG inhalation capsule Place 1 application into inhaler and inhale daily. 10/13/15  Yes [provider]  terazosin (HYTRIN) 5 MG capsule Take 5 mg by mouth every evening.   Yes [provider]  traZODone (DESYREL) 50 MG tablet Take 0.5 tablets (25 mg total) by mouth at bedtime as needed for sleep. 06/29/18  Yes Mody, Sital, MD  trimethoprim-polymyxin b (POLYTRIM) ophthalmic solution Place 2 drops into the right eye every 6 (six) hours. 02/02/16  Yes Cuthriell, Delorise RoyalsJonathan D, PA-C  apixaban (ELIQUIS) 5 MG TABS tablet Take 1 tablet (5 mg total) by mouth 2 (two) times daily. 11/21/15   Haydee SalterHobbs, Phillip M, MD  azithromycin (ZITHROMAX Z-PAK) 250 MG tablet 1 tablet (250 mg) once daily on Days 2 through 5. Patient not taking: Reported on 07/28/2018 03/25/18   Emily FilbertWilliams, Jonathan E, MD  insulin glargine (LANTUS) 100 UNIT/ML injection Inject 0.1 mLs (10 Units total) into the skin at bedtime. Patient not taking: Reported on 07/28/2018 06/29/18   Adrian SaranMody, Sital, MD  triamcinolone cream (KENALOG) 0.1 % Apply 1 application topically 2 (two) times daily as needed (for irritation).    [provider]    Review of  Systems  Constitutional: Negative for appetite change.  HENT: Negative.   Eyes: Negative.   Respiratory: Negative for chest tightness and shortness of breath.   Cardiovascular: Negative for chest pain, palpitations and leg swelling.  Gastrointestinal: Negative for abdominal distention and abdominal pain.  Endocrine: Negative.   Genitourinary: Negative.   Musculoskeletal: Negative for back pain and neck pain.  Skin: Negative.   Allergic/Immunologic: Negative.   Neurological: Negative for dizziness and light-headedness.  Psychiatric/Behavioral: Negative for sleep disturbance.       Dementia   Vitals:   07/28/18 1059  BP: (!) 93/54  Pulse: (!) 47  Resp: 18  SpO2: 90%   Wt Readings from Last 3 Encounters:  2019-03-07 170 lb (77.1 kg)  06/29/18 171 lb 1.2 oz (77.6 kg)  04/08/18 194 lb (88 kg)  Lab Results  Component Value Date   CREATININE 1.29 (H) 06/29/2018   CREATININE 1.51 (H) 06/28/2018   CREATININE 1.23 06/27/2018   Physical Exam Vitals signs and nursing note reviewed.  Constitutional:      General: He is awake.     Appearance: Normal appearance.  HENT:     Head: Normocephalic and atraumatic.  Neck:     Musculoskeletal: Normal range of motion and neck supple.  Cardiovascular:     Rate and Rhythm: Regular rhythm. Bradycardia present.  Pulmonary:     Effort: Pulmonary effort is normal.     Breath sounds: No wheezing or rales.  Abdominal:     General: There is no distension.     Palpations: Abdomen is soft.  Musculoskeletal:        General: No swelling or tenderness.  Skin:    General: Skin is warm and dry.  Psychiatric:        Mood and Affect: Affect is flat.        Speech: He is noncommunicative.        Cognition and Memory: Cognition is impaired.     Assessment & Plan:  1: Chronic heart failure with reduced ejection fraction- - NYHA class I - euvolemic today - not being weighed [facility] at [facility] so an order was written for him to be weighed daily  and for them to call for an overnight weight gain of >2 pounds or a weekly weight gain of >5 pounds - aide says that she doesn't think the food is cooked with salt - BP will not allow switching his losartan to entresto; may need to decrease losartan if BP remains low - bradycardia will not allow titration of carvedilol - BNP 06/24/18 was 2260.0  2: HTN- - BP on the low side today - sees PCP at [facility] - BMP from 06/29/18 reviewed and showed sodium 144, potassium 4.1, creatinine 1.29 and GFR 49  3: DM- - glucose at [facility] on 07/21/2018 was 109 - A1c on 03/23/16 was 5.5%  Facility medication list was reviewed.  Due to patient's severe dementia, will not make a return appointment at this time. Advised aide that [employer] could call back at anytime to make another appointment.

## 2018-07-29 ENCOUNTER — Encounter: Payer: Self-pay | Admitting: Family

## 2018-07-29 ENCOUNTER — Emergency Department (HOSPITAL_COMMUNITY)
Admission: EM | Admit: 2018-07-29 | Discharge: 2018-08-19 | Disposition: E | Payer: Medicare Other | Attending: Emergency Medicine | Admitting: Emergency Medicine

## 2018-07-29 ENCOUNTER — Encounter (HOSPITAL_COMMUNITY): Payer: Self-pay | Admitting: Emergency Medicine

## 2018-07-29 DIAGNOSIS — I469 Cardiac arrest, cause unspecified: Secondary | ICD-10-CM

## 2018-07-29 DIAGNOSIS — G9340 Encephalopathy, unspecified: Secondary | ICD-10-CM | POA: Diagnosis not present

## 2018-07-29 DIAGNOSIS — J449 Chronic obstructive pulmonary disease, unspecified: Secondary | ICD-10-CM | POA: Diagnosis not present

## 2018-07-29 DIAGNOSIS — I509 Heart failure, unspecified: Secondary | ICD-10-CM | POA: Diagnosis not present

## 2018-07-29 DIAGNOSIS — E119 Type 2 diabetes mellitus without complications: Secondary | ICD-10-CM | POA: Diagnosis not present

## 2018-08-19 NOTE — ED Triage Notes (Signed)
Pt has unwitnessed cardiac arrest- staff did morning rounds at 0830 and found him asystole. CPR started around 1202. 8 rounds epi total. Pt regained pulses at 1222. Lost pulses at 1250. Per staff pt full code.

## 2018-08-19 NOTE — ED Provider Notes (Addendum)
[facility] Provider Note   CSN: [id number] Arrival date & time: 2018/10/25  1253     History   Chief Complaint Chief Complaint  Patient presents with  . Cardiac Arrest    HPI Essie ChristineBilly E Anaya is a 85 y.o. male.  [facility] they arrived with ongoing CPR.  They took over the situation this was around 12 noon.  Patient apparently full code.  Patient's past medical history significant for dementia with behavioral disturbances acute encephalopathy severe CHF.  COPD.  Diabetes.  Past history of stroke.  EMS and continued on with the resuscitation.  Patient got multiple doses of epinephrine got a return of a pulse.  He was intubated there with a endotracheal tube.  In transport here patient did not wake up did not breathe all at all on his own.  Had no ocular activity pupils were fixed.  Upon arrival here patient lost pulse again got another dose of epinephrine by EMS.  They also had put an IO in his right anterior shin area.  Patient arrived with the thumper in place and ongoing CPR.     Past Medical History:  Diagnosis Date  . A-fib (HCC)   . 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 03/2014: EF 35%, normal nuc in 05/2014 b. echo 11/2015: EF 40-45% w/ diffuse HK and Grade 1 DD  . COPD (chronic obstructive pulmonary disease) (HCC)   . Diabetes mellitus without complication (HCC)   . High cholesterol   . Hypertension     Patient Active Problem List   Diagnosis Date Noted  . Dementia with behavioral disturbance (HCC) 06/27/2018  . Acute encephalopathy 06/23/2018  . Weakness 06/10/2016  . Cerebral thrombosis with cerebral infarction 03/23/2016  . Acute ischemic stroke (HCC) 03/23/2016  . Acute CVA (cerebrovascular accident) (HCC) 03/23/2016  . Pain in the chest 03/22/2016  . Dizziness 03/22/2016  . Abdominal pain 03/22/2016  . Essential hypertension  03/22/2016  . Chronic combined systolic (congestive) and diastolic (congestive) heart failure (HCC)   . 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 Bradycardia 02/10/2016  . Atrial fibrillation (HCC) 11/19/2015  . CAD (coronary artery disease) 11/16/2015  . COPD (chronic obstructive pulmonary disease) (HCC) 11/16/2015  . GERD (gastroesophageal reflux disease) 11/16/2015    Past Surgical History:  Procedure Laterality Date  . BACK SURGERY    . CARPAL TUNNEL RELEASE    . CHOLECYSTECTOMY          Home Medications    Prior to Admission medications   Medication Sig Start Date End Date Taking? Authorizing Provider  albuterol (PROAIR HFA) 108 (90 Base) MCG/ACT inhaler Inhale 2 puffs into the lungs every 4 (four) hours as needed for wheezing or shortness of breath.     [provider]  apixaban (ELIQUIS) 5 MG TABS tablet Take 1 tablet (5 mg total) by mouth 2 (two) times daily. 11/21/15   Haydee SalterHobbs, Phillip M, MD  atorvastatin (LIPITOR) 20 MG tablet Take 1 tablet (20 mg total) by mouth daily at 6 PM. 03/26/16   Tyrone NineGrunz, Ryan B, MD  azithromycin (ZITHROMAX Z-PAK) 250 MG tablet 1 tablet (250 mg) once daily on Days 2 through 5. Patient not taking: Reported on 07/28/2018 03/25/18   Emily FilbertWilliams, Jonathan E, MD  brimonidine [facility](ALPHAGAN) 0.2 % ophthalmic solution Place 1 drop into both eyes daily.     [provider]  carvedilol (COREG) 6.25 MG tablet Take 6.25 mg by mouth 2 (two) times daily with a meal.     [provider]  Fluticasone-Salmeterol (ADVAIR DISKUS) 250-50 MCG/DOSE AEPB Inhale 1 puff into the lungs daily as needed (for wheezing or shortness).     [provider]  furosemide (LASIX) 40 MG tablet Take 1 tablet (40 mg total) by mouth daily. 02/23/16   Ngetich, Dinah C, NP  gabapentin (NEURONTIN) 300 MG capsule Take 300 mg by mouth at bedtime.    [provider]  ibuprofen (ADVIL,MOTRIN) 400 MG tablet Take 400 mg by mouth  every 6 (six) hours as needed for moderate pain.    [provider]  insulin glargine (LANTUS) 100 UNIT/ML injection Inject 0.1 mLs (10 Units total) into the skin at bedtime. Patient not taking: Reported on 07/28/2018 06/29/18   Adrian SaranMody, Sital, MD  insulin lispro (HUMALOG) 100 UNIT/ML injection Inject 0-9 Units into the skin 3 (three) times daily before meals. CBG's 70-120 = 0 units; 121-150 =1 units; 151-200 = 2 units; 201-250 =3 units; 251-300 =5 units; 301-350 =7 units; 351-400=9 units and > 400 Notify provider.    [provider]  ipratropium-albuterol (DUONEB) 0.5-2.5 (3) MG/3ML SOLN Take 3 mLs by nebulization every 6 (six) hours as needed (for SOB).    [provider]  latanoprost (XALATAN) 0.005 % ophthalmic solution Place 1 drop into both eyes at bedtime.    [provider]  losartan (COZAAR) 100 MG tablet Take 100 mg by mouth daily.    [provider]  nateglinide (STARLIX) 120 MG tablet Take 120 mg by mouth daily. 10/02/15   [provider]  Omega-3 1000 MG CAPS Take 1 g by mouth daily.    [provider]  omeprazole (PRILOSEC) 20 MG capsule Take 20 mg by mouth daily.    [provider]  polyvinyl alcohol (LIQUIFILM TEARS) 1.4 % ophthalmic solution Place 1 drop into both eyes as needed for dry eyes. Reported on 12/21/2015    [provider]  potassium chloride SA (K-DUR,KLOR-CON) 20 MEQ tablet Take 20 mEq by mouth daily.    [provider]  QUEtiapine (SEROQUEL) 25 MG tablet Take 1 tablet (25 mg total) by mouth at bedtime as needed (agitation). 06/29/18   Adrian SaranMody, Sital, MD  risperiDONE (RISPERDAL M-TABS) 1 MG disintegrating tablet Take 1 tablet (1 mg total) by mouth 2 (two) times daily. 06/29/18   Adrian SaranMody, Sital, MD  SPIRIVA HANDIHALER 18 MCG inhalation capsule Place 1 application into inhaler and inhale daily. 10/13/15   [provider]  terazosin (HYTRIN) 5 MG capsule Take 5 mg by mouth every evening.     [provider]  traZODone (DESYREL) 50 MG tablet Take 0.5 tablets (25 mg total) by mouth at bedtime as needed for sleep. 06/29/18   Adrian SaranMody, Sital, MD  triamcinolone cream (KENALOG) 0.1 % Apply 1 application topically 2 (two) times daily as needed (for irritation).    [provider]  trimethoprim-polymyxin b (POLYTRIM) ophthalmic solution Place 2 drops into the right eye every 6 (six) hours. 02/02/16   Cuthriell, Delorise RoyalsJonathan D, PA-C    Family History Family History  Problem Relation Age of Onset  . Prostate cancer Unknown     Social History Social History   Tobacco Use  . Smoking status: Former Games developermoker  . Smokeless tobacco: Never Used  Substance Use Topics  . Alcohol use: No  . Drug use: No     Allergies   Aldactone [spironolactone];  Aspirin; Ciprofloxacin; Cyclobenzaprine; and Lexapro [escitalopram]   Review of Systems Review of Systems  Unable to perform ROS: Patient unresponsive     Physical Exam Updated Vital Signs BP (!) 0/0   Pulse (!) 0   Resp (!) 0   SpO2 (!) 0%   Physical Exam Vitals signs and nursing note reviewed.  Constitutional:      Comments: Ongoing CPR  HENT:     Head: Atraumatic.     Mouth/Throat:     Comments: Intubated with 7.0 endotracheal tube. Eyes:     Comments: Pupils fixed at about 3 mm.  No corneal reflex.  Cardiovascular:     Rate and Rhythm: Bradycardia present.  Pulmonary:     Comments: With bagging bilateral equal breath sounds Abdominal:     General: Abdomen is flat. There is no distension.  Musculoskeletal:     Comments: I O right anterior shin area.  Skin:    Coloration: Skin is pale.  Neurological:     Comments: Patient without any spontaneous respirations no corneal reflex.  Pupils were fixed at 3 mm.  No spontaneous movement at all.      ED Treatments / Results  Labs (all labs ordered are listed, but only abnormal results are displayed) Labs Reviewed - No data to display  EKG None  Radiology No  results found.  Procedures Procedures (including critical care time)  CRITICAL CARE Performed by: Vanetta Mulders Total critical care time: 30 minutes Critical care time was exclusive of separately billable procedures and treating other patients. Critical care was necessary to treat or prevent imminent or life-threatening deterioration. Critical care was time spent personally by me on the following activities: development of treatment plan with patient and/or surrogate as well as nursing, discussions with consultants, evaluation of patient's response to treatment, examination of patient, obtaining history from patient or surrogate, ordering and performing treatments and interventions, ordering and review of laboratory studies, ordering and review of radiographic studies, pulse oximetry and re-evaluation of patient's condition.   Medications Ordered in ED Medications - No data to display   Initial Impression / Assessment and Plan / ED Course  I have reviewed the triage vital signs and the nursing notes.  Pertinent labs & imaging results that were available during my care of the patient were reviewed by me and considered in my medical decision making (see chart for details).     EMS called to [facility] for cardiac arrest.  When they arrived CPR was ongoing.  They got there around 12 noon.  At about 1230 patient had spontaneous return of circulation after receiving multiple doses of epinephrine.  However patient did not wake up.  Patient never started breathing on his own.  Patient was intubated by them at the scene.  With a 7 oh tube.  Patient lost spontaneous circulation shortly prior to arrival or upon arrival here received another dose of epinephrine.  Came in with ongoing CPR.  Patient moved over to our equipment reassessed he had no corneal reflex pupils were fixed and about 3 mill meters.  Patient not doing any breathing on his own although we did have a pulse.  Heart rate was in the  50s.  Based on the duration of the CPR and his neurological findings patient is clinically brain dead.  CPR stopped when he lost a pulse again at about 1300.  Patient pronounced at 1305.  Review of patient's past medical history showed that he had significant medical problems.  To include  severe congestive heart failure.  And recently had been admitted for encephalopathy.  According to [facility] patient was a full code.    Patient pronounced dead at 37.  Discussed with medical examiner, French Ana, not a medical examiner's case.  Final Clinical Impressions(s) / ED Diagnoses   Final diagnoses:  Cardiac arrest [facility])    ED Discharge Orders    None       Vanetta Mulders, MD 14-Aug-2018 1322    Vanetta Mulders, MD August 14, 2018 1322

## 2018-08-19 NOTE — Progress Notes (Signed)
   08-28-2018 1347  Clinical Encounter Type  Visited With Patient and family together  Visit Type Initial;Death;ED;Spiritual support  Referral From Nurse  Consult/Referral To Chaplain  The chaplain spiritually cared for family of the deceased Pt. The spiritual care consisted of accompanying the medical team in the sharing of the death with the family and being present with the Pt. adult children in ED Trauma A.

## 2018-08-19 NOTE — ED Notes (Addendum)
TOD 1305. RN called French Ana, medical examiner's office. Not a ME case.

## 2018-08-19 DEATH — deceased
